# Patient Record
Sex: Female | Born: 1970 | Race: Black or African American | Hispanic: No | Smoking: Former smoker
Health system: Southern US, Community
[De-identification: ages and names within clinical notes are randomized; demographics above are authoritative.]

## PROBLEM LIST (undated history)

## (undated) DIAGNOSIS — D369 Benign neoplasm, unspecified site: Secondary | ICD-10-CM

## (undated) DIAGNOSIS — F429 Obsessive-compulsive disorder, unspecified: Secondary | ICD-10-CM

## (undated) DIAGNOSIS — G43909 Migraine, unspecified, not intractable, without status migrainosus: Secondary | ICD-10-CM

## (undated) DIAGNOSIS — Z5189 Encounter for other specified aftercare: Secondary | ICD-10-CM

## (undated) DIAGNOSIS — I10 Essential (primary) hypertension: Secondary | ICD-10-CM

## (undated) DIAGNOSIS — F32A Depression, unspecified: Secondary | ICD-10-CM

## (undated) DIAGNOSIS — E785 Hyperlipidemia, unspecified: Secondary | ICD-10-CM

## (undated) DIAGNOSIS — K922 Gastrointestinal hemorrhage, unspecified: Secondary | ICD-10-CM

## (undated) DIAGNOSIS — N189 Chronic kidney disease, unspecified: Secondary | ICD-10-CM

## (undated) DIAGNOSIS — K7030 Alcoholic cirrhosis of liver without ascites: Secondary | ICD-10-CM

## (undated) DIAGNOSIS — R600 Localized edema: Secondary | ICD-10-CM

## (undated) DIAGNOSIS — F1010 Alcohol abuse, uncomplicated: Secondary | ICD-10-CM

## (undated) DIAGNOSIS — E039 Hypothyroidism, unspecified: Secondary | ICD-10-CM

## (undated) DIAGNOSIS — R112 Nausea with vomiting, unspecified: Secondary | ICD-10-CM

## (undated) DIAGNOSIS — R011 Cardiac murmur, unspecified: Secondary | ICD-10-CM

## (undated) DIAGNOSIS — L0292 Furuncle, unspecified: Secondary | ICD-10-CM

## (undated) DIAGNOSIS — F329 Major depressive disorder, single episode, unspecified: Secondary | ICD-10-CM

## (undated) DIAGNOSIS — R609 Edema, unspecified: Secondary | ICD-10-CM

## (undated) DIAGNOSIS — F4310 Post-traumatic stress disorder, unspecified: Secondary | ICD-10-CM

## (undated) DIAGNOSIS — D689 Coagulation defect, unspecified: Secondary | ICD-10-CM

## (undated) DIAGNOSIS — K219 Gastro-esophageal reflux disease without esophagitis: Secondary | ICD-10-CM

## (undated) DIAGNOSIS — D649 Anemia, unspecified: Secondary | ICD-10-CM

## (undated) DIAGNOSIS — J449 Chronic obstructive pulmonary disease, unspecified: Secondary | ICD-10-CM

## (undated) DIAGNOSIS — I519 Heart disease, unspecified: Secondary | ICD-10-CM

## (undated) HISTORY — DX: Furuncle, unspecified: L02.92

## (undated) HISTORY — DX: Gastrointestinal hemorrhage, unspecified: K92.2

## (undated) HISTORY — DX: Coagulation defect, unspecified: D68.9

## (undated) HISTORY — DX: Chronic kidney disease, unspecified: N18.9

## (undated) HISTORY — DX: Hyperlipidemia, unspecified: E78.5

## (undated) HISTORY — DX: Encounter for other specified aftercare: Z51.89

## (undated) HISTORY — DX: Heart disease, unspecified: I51.9

## (undated) HISTORY — PX: ABCESS DRAINAGE: SHX399

## (undated) HISTORY — DX: Obsessive-compulsive disorder, unspecified: F42.9

## (undated) HISTORY — DX: Gastro-esophageal reflux disease without esophagitis: K21.9

## (undated) HISTORY — DX: Nausea with vomiting, unspecified: R11.2

## (undated) HISTORY — DX: Post-traumatic stress disorder, unspecified: F43.10

## (undated) HISTORY — PX: VAGINAL HYSTERECTOMY: SHX2639

## (undated) HISTORY — DX: Cardiac murmur, unspecified: R01.1

## (undated) HISTORY — DX: Benign neoplasm, unspecified site: D36.9

---

## 2002-08-17 ENCOUNTER — Emergency Department (HOSPITAL_COMMUNITY): Admission: EM | Admit: 2002-08-17 | Discharge: 2002-08-17 | Payer: Self-pay | Admitting: *Deleted

## 2003-01-30 ENCOUNTER — Emergency Department (HOSPITAL_COMMUNITY): Admission: EM | Admit: 2003-01-30 | Discharge: 2003-01-30 | Payer: Self-pay | Admitting: Emergency Medicine

## 2003-05-31 ENCOUNTER — Emergency Department (HOSPITAL_COMMUNITY): Admission: EM | Admit: 2003-05-31 | Discharge: 2003-05-31 | Payer: Self-pay | Admitting: Emergency Medicine

## 2005-11-09 ENCOUNTER — Emergency Department (HOSPITAL_COMMUNITY): Admission: EM | Admit: 2005-11-09 | Discharge: 2005-11-09 | Payer: Self-pay | Admitting: Emergency Medicine

## 2006-06-06 ENCOUNTER — Emergency Department (HOSPITAL_COMMUNITY): Admission: EM | Admit: 2006-06-06 | Discharge: 2006-06-06 | Payer: Self-pay | Admitting: Emergency Medicine

## 2006-07-28 ENCOUNTER — Emergency Department (HOSPITAL_COMMUNITY): Admission: EM | Admit: 2006-07-28 | Discharge: 2006-07-28 | Payer: Self-pay | Admitting: Emergency Medicine

## 2007-01-17 ENCOUNTER — Emergency Department (HOSPITAL_COMMUNITY): Admission: EM | Admit: 2007-01-17 | Discharge: 2007-01-17 | Payer: Self-pay | Admitting: Emergency Medicine

## 2007-07-31 ENCOUNTER — Emergency Department (HOSPITAL_COMMUNITY): Admission: EM | Admit: 2007-07-31 | Discharge: 2007-07-31 | Payer: Self-pay | Admitting: Emergency Medicine

## 2008-03-04 ENCOUNTER — Emergency Department (HOSPITAL_COMMUNITY): Admission: EM | Admit: 2008-03-04 | Discharge: 2008-03-04 | Payer: Self-pay | Admitting: Emergency Medicine

## 2008-04-13 ENCOUNTER — Other Ambulatory Visit: Admission: RE | Admit: 2008-04-13 | Discharge: 2008-04-13 | Payer: Self-pay | Admitting: Obstetrics & Gynecology

## 2008-09-30 ENCOUNTER — Emergency Department (HOSPITAL_COMMUNITY): Admission: EM | Admit: 2008-09-30 | Discharge: 2008-09-30 | Payer: Self-pay | Admitting: Emergency Medicine

## 2009-04-16 ENCOUNTER — Emergency Department (HOSPITAL_COMMUNITY): Admission: EM | Admit: 2009-04-16 | Discharge: 2009-04-16 | Payer: Self-pay | Admitting: Emergency Medicine

## 2009-09-05 ENCOUNTER — Emergency Department (HOSPITAL_COMMUNITY): Admission: EM | Admit: 2009-09-05 | Discharge: 2009-09-05 | Payer: Self-pay | Admitting: Emergency Medicine

## 2009-12-01 ENCOUNTER — Emergency Department (HOSPITAL_COMMUNITY)
Admission: EM | Admit: 2009-12-01 | Discharge: 2009-12-01 | Payer: Self-pay | Source: Home / Self Care | Admitting: Emergency Medicine

## 2010-05-15 LAB — BASIC METABOLIC PANEL
BUN: 7 mg/dL (ref 6–23)
CO2: 23 mEq/L (ref 19–32)
Calcium: 8.5 mg/dL (ref 8.4–10.5)
Chloride: 104 mEq/L (ref 96–112)
Creatinine, Ser: 1 mg/dL (ref 0.4–1.2)
GFR calc Af Amer: >60 mL/min (ref >60)
GFR calc non Af Amer: >60 mL/min (ref >60)
Glucose, Bld: 92 mg/dL (ref 70–99)
Potassium: 3.6 mEq/L (ref 3.5–5.1)
Sodium: 134 mEq/L — ABNORMAL LOW (ref 135–145)

## 2010-05-15 LAB — CBC
HCT: 38.6 % (ref 36.0–46.0)
Hemoglobin: 12.8 g/dL (ref 12.0–15.0)
MCH: 30.1 pg (ref 26.0–34.0)
MCHC: 33 g/dL (ref 30.0–36.0)
MCV: 91.1 fL (ref 78.0–100.0)
Platelets: 275 10*3/uL (ref 150–400)
RBC: 4.23 MIL/uL (ref 3.87–5.11)
RDW: 16.7 % — ABNORMAL HIGH (ref 11.5–15.5)
WBC: 13.6 10*3/uL — ABNORMAL HIGH (ref 4.0–10.5)

## 2010-05-15 LAB — URINALYSIS, ROUTINE W REFLEX MICROSCOPIC
Bilirubin Urine: NEGATIVE
Glucose, UA: NEGATIVE mg/dL
Ketones, ur: NEGATIVE mg/dL
Leukocytes, UA: NEGATIVE
Nitrite: NEGATIVE
Protein, ur: NEGATIVE mg/dL
Specific Gravity, Urine: 1.025 (ref 1.005–1.030)
Urobilinogen, UA: 0.2 mg/dL (ref 0.0–1.0)
pH: 6 (ref 5.0–8.0)

## 2010-05-15 LAB — DIFFERENTIAL
Basophils Absolute: 0 10*3/uL (ref 0.0–0.1)
Basophils Relative: 0 % (ref 0–1)
Eosinophils Absolute: 0.2 10*3/uL (ref 0.0–0.7)
Eosinophils Relative: 2 % (ref 0–5)
Lymphocytes Relative: 26 % (ref 12–46)
Lymphs Abs: 3.5 10*3/uL (ref 0.7–4.0)
Monocytes Absolute: 0.6 10*3/uL (ref 0.1–1.0)
Monocytes Relative: 4 % (ref 3–12)
Neutro Abs: 9.3 10*3/uL — ABNORMAL HIGH (ref 1.7–7.7)
Neutrophils Relative %: 68 % (ref 43–77)

## 2010-05-15 LAB — URINE CULTURE: Colony Count: 60000

## 2010-05-15 LAB — URINE MICROSCOPIC-ADD ON

## 2010-05-15 LAB — BRAIN NATRIURETIC PEPTIDE: Pro B Natriuretic peptide (BNP): <30 pg/mL (ref 0.0–100.0)

## 2010-05-15 LAB — PREGNANCY, URINE: Preg Test, Ur: NEGATIVE

## 2010-06-04 LAB — GLUCOSE, CAPILLARY: Glucose-Capillary: 81 mg/dL (ref 70–99)

## 2010-06-13 LAB — URINALYSIS, ROUTINE W REFLEX MICROSCOPIC
Bilirubin Urine: NEGATIVE
Glucose, UA: NEGATIVE mg/dL
Hgb urine dipstick: NEGATIVE
Ketones, ur: NEGATIVE mg/dL
Nitrite: NEGATIVE
Protein, ur: NEGATIVE mg/dL
Specific Gravity, Urine: 1.02 (ref 1.005–1.030)
Urobilinogen, UA: 0.2 mg/dL (ref 0.0–1.0)
pH: 7 (ref 5.0–8.0)

## 2010-06-13 LAB — DIFFERENTIAL
Basophils Absolute: 0 10*3/uL (ref 0.0–0.1)
Basophils Relative: 0 % (ref 0–1)
Eosinophils Absolute: 0.1 10*3/uL (ref 0.0–0.7)
Eosinophils Relative: 1 % (ref 0–5)
Lymphocytes Relative: 23 % (ref 12–46)
Lymphs Abs: 2.7 10*3/uL (ref 0.7–4.0)
Monocytes Absolute: 0.5 10*3/uL (ref 0.1–1.0)
Monocytes Relative: 4 % (ref 3–12)
Neutro Abs: 8.5 10*3/uL — ABNORMAL HIGH (ref 1.7–7.7)
Neutrophils Relative %: 72 % (ref 43–77)

## 2010-06-13 LAB — BASIC METABOLIC PANEL
BUN: 9 mg/dL (ref 6–23)
CO2: 24 mEq/L (ref 19–32)
Calcium: 8.8 mg/dL (ref 8.4–10.5)
Chloride: 105 mEq/L (ref 96–112)
Creatinine, Ser: 0.71 mg/dL (ref 0.4–1.2)
GFR calc Af Amer: >60 mL/min (ref >60)
GFR calc non Af Amer: >60 mL/min (ref >60)
Glucose, Bld: 84 mg/dL (ref 70–99)
Potassium: 3.6 mEq/L (ref 3.5–5.1)
Sodium: 137 mEq/L (ref 135–145)

## 2010-06-13 LAB — PREGNANCY, URINE: Preg Test, Ur: NEGATIVE

## 2010-06-13 LAB — CBC
HCT: 41.8 % (ref 36.0–46.0)
Hemoglobin: 13.8 g/dL (ref 12.0–15.0)
MCHC: 33 g/dL (ref 30.0–36.0)
MCV: 87.3 fL (ref 78.0–100.0)
Platelets: 309 10*3/uL (ref 150–400)
RBC: 4.8 MIL/uL (ref 3.87–5.11)
RDW: 14.4 % (ref 11.5–15.5)
WBC: 11.9 10*3/uL — ABNORMAL HIGH (ref 4.0–10.5)

## 2010-10-01 ENCOUNTER — Encounter: Payer: Self-pay | Admitting: *Deleted

## 2010-10-01 ENCOUNTER — Emergency Department (HOSPITAL_COMMUNITY)
Admission: EM | Admit: 2010-10-01 | Discharge: 2010-10-01 | Disposition: A | Discharge status: Home / Self Care | Payer: Self-pay | Attending: Emergency Medicine | Admitting: Emergency Medicine

## 2010-10-01 ENCOUNTER — Emergency Department (HOSPITAL_COMMUNITY): Payer: Self-pay

## 2010-10-01 DIAGNOSIS — F17200 Nicotine dependence, unspecified, uncomplicated: Secondary | ICD-10-CM | POA: Insufficient documentation

## 2010-10-01 DIAGNOSIS — J40 Bronchitis, not specified as acute or chronic: Secondary | ICD-10-CM | POA: Insufficient documentation

## 2010-10-01 LAB — RAPID URINE DRUG SCREEN, HOSP PERFORMED
Amphetamines: NOT DETECTED
Barbiturates: NOT DETECTED
Benzodiazepines: NOT DETECTED
Cocaine: POSITIVE — AB
Opiates: NOT DETECTED
Tetrahydrocannabinol: NOT DETECTED

## 2010-10-01 LAB — PREGNANCY, URINE: Preg Test, Ur: NEGATIVE

## 2010-10-01 MED ORDER — DEXTROMETHORPHAN HBR 15 MG/5ML PO SYRP: 10.0000 mL | ORAL_SOLUTION | Freq: Four times a day (QID) | ORAL | Status: AC | PRN

## 2010-10-01 MED ORDER — AZITHROMYCIN 250 MG PO TABS: 250.0000 mg | ORAL_TABLET | Freq: Every day | ORAL | Status: AC

## 2010-10-01 NOTE — ED Notes (Signed)
Pt states she has been coughing and spitting mucus for a week. She also states she has pain to her sides from coughing. Pt is in ed today d/t sore throat

## 2010-10-01 NOTE — ED Provider Notes (Signed)
History     CSN: [id number] Arrival date & time: 10/01/2010  7:09 AM  Chief Complaint  Patient presents with  . Sore Throat    x1 week   HPI Comments: States productive cough for 2 weeks.  No fevers or chills.  Is a smoker.    Patient is a 40 y.o. female presenting with pharyngitis. The history is provided by the patient.  Sore Throat The current episode started more than 1 week ago. The problem occurs constantly. The problem has not changed since onset.Pertinent negatives include no chest pain, no abdominal pain and no shortness of breath. The symptoms are aggravated by nothing. The symptoms are relieved by nothing. She has tried nothing for the symptoms.    History reviewed. No pertinent past medical history.  History reviewed. No pertinent past surgical history.  Family History  Problem Relation Age of Onset  . Diabetes Mother   . Diabetes Father     History  Substance Use Topics  . Smoking status: Current Everyday Smoker -- 0.5 packs/day  . Smokeless tobacco: Never Used  . Alcohol Use: Yes     pt states she drinks "a lot"    OB History    Grav Para Term Preterm Abortions TAB SAB Ect Mult Living                  Review of Systems  Constitutional: Negative for fever and chills.  HENT: Positive for sore throat.   Respiratory: Positive for cough. Negative for shortness of breath.   Cardiovascular: Negative for chest pain and palpitations.  Gastrointestinal: Negative for abdominal pain.  All other systems reviewed and are negative.    Physical Exam  BP 143/101  Pulse 108  Temp(Src) 98.8 F (37.1 C) (Oral)  Resp 16  Ht 5\' 4"  (1.626 m)  Wt 255 lb (115.667 kg)  BMI 43.77 kg/m2  SpO2 100%  Physical Exam  Constitutional: She is oriented to person, place, and time. She appears well-developed and well-nourished. No distress.  HENT:  Head: Normocephalic and atraumatic.  Right Ear: External ear normal.  Left Ear: External ear normal.  Neck: Normal range of  motion. Neck supple.  Cardiovascular: Normal rate and regular rhythm.  Exam reveals no gallop and no friction rub.   No murmur heard. Pulmonary/Chest: Effort normal and breath sounds normal. No respiratory distress. She has no wheezes. She has no rales.  Abdominal: Soft. Bowel sounds are normal.  Musculoskeletal: Normal range of motion.  Lymphadenopathy:    She has no cervical adenopathy.  Neurological: She is alert and oriented to person, place, and time.  Skin: She is not diaphoretic.    ED Course  Procedures  MDM No resp distress, sats okay.  Will discharge with antibiotics, robit ac.       Geoffery Lyons, MD 10/01/10 [phone]

## 2010-10-06 ENCOUNTER — Encounter (HOSPITAL_COMMUNITY): Payer: Self-pay | Admitting: *Deleted

## 2010-10-06 ENCOUNTER — Emergency Department (HOSPITAL_COMMUNITY)
Admission: EM | Admit: 2010-10-06 | Discharge: 2010-10-06 | Disposition: A | Discharge status: Home / Self Care | Payer: Self-pay | Attending: Emergency Medicine | Admitting: Emergency Medicine

## 2010-10-06 DIAGNOSIS — F411 Generalized anxiety disorder: Secondary | ICD-10-CM | POA: Insufficient documentation

## 2010-10-06 DIAGNOSIS — F17200 Nicotine dependence, unspecified, uncomplicated: Secondary | ICD-10-CM | POA: Insufficient documentation
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
## 2010-11-24 LAB — DIFFERENTIAL
Basophils Absolute: 0.1
Basophils Relative: 1
Monocytes Relative: 5
Neutro Abs: 9.6 — ABNORMAL HIGH
Neutrophils Relative %: 69

## 2010-11-24 LAB — PREGNANCY, URINE: Preg Test, Ur: NEGATIVE

## 2010-11-24 LAB — URINALYSIS, ROUTINE W REFLEX MICROSCOPIC
Hgb urine dipstick: NEGATIVE
Protein, ur: NEGATIVE
Urobilinogen, UA: 0.2

## 2010-11-24 LAB — BASIC METABOLIC PANEL
CO2: 26
Calcium: 9.2
Creatinine, Ser: 0.85
GFR calc Af Amer: >60

## 2010-11-24 LAB — CBC
MCHC: 34.1
RBC: 4.56
RDW: 13.6

## 2010-12-05 ENCOUNTER — Emergency Department (HOSPITAL_COMMUNITY)
Admission: EM | Admit: 2010-12-05 | Discharge: 2010-12-05 | Disposition: A | Discharge status: Home / Self Care | Payer: Self-pay | Attending: Emergency Medicine | Admitting: Emergency Medicine

## 2010-12-05 ENCOUNTER — Encounter (HOSPITAL_COMMUNITY): Payer: Self-pay | Admitting: *Deleted

## 2010-12-05 DIAGNOSIS — Z87891 Personal history of nicotine dependence: Secondary | ICD-10-CM | POA: Insufficient documentation

## 2010-12-05 DIAGNOSIS — S41109A Unspecified open wound of unspecified upper arm, initial encounter: Secondary | ICD-10-CM | POA: Insufficient documentation

## 2010-12-05 MED ORDER — TETANUS-DIPHTH-ACELL PERTUSSIS 5-2.5-18.5 LF-MCG/0.5 IM SUSP
0.5000 mL | Freq: Once | INTRAMUSCULAR | Status: AC
  Administered 2010-12-05: 0.5 mL via INTRAMUSCULAR
  Filled 2010-12-05: qty 0.5
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
## 2011-03-11 ENCOUNTER — Emergency Department (HOSPITAL_COMMUNITY)
Admission: EM | Admit: 2011-03-11 | Discharge: 2011-03-11 | Discharge status: Against Medical Advice | Payer: Self-pay | Attending: Emergency Medicine | Admitting: Emergency Medicine

## 2011-03-11 ENCOUNTER — Encounter (HOSPITAL_COMMUNITY): Payer: Self-pay | Admitting: Emergency Medicine

## 2011-03-11 DIAGNOSIS — M549 Dorsalgia, unspecified: Secondary | ICD-10-CM | POA: Insufficient documentation

## 2011-03-11 HISTORY — DX: Major depressive disorder, single episode, unspecified: F32.9

## 2011-03-11 HISTORY — DX: Essential (primary) hypertension: I10

## 2011-03-11 HISTORY — DX: Depression, unspecified: F32.A
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
## 2011-03-18 ENCOUNTER — Emergency Department (HOSPITAL_COMMUNITY): Payer: Self-pay

## 2011-03-18 ENCOUNTER — Emergency Department (HOSPITAL_COMMUNITY)
Admission: EM | Admit: 2011-03-18 | Discharge: 2011-03-19 | Disposition: A | Discharge status: Home / Self Care | Payer: Self-pay | Attending: Emergency Medicine | Admitting: Emergency Medicine

## 2011-03-18 ENCOUNTER — Other Ambulatory Visit: Payer: Self-pay

## 2011-03-18 ENCOUNTER — Encounter (HOSPITAL_COMMUNITY): Payer: Self-pay

## 2011-03-18 DIAGNOSIS — I498 Other specified cardiac arrhythmias: Secondary | ICD-10-CM | POA: Insufficient documentation

## 2011-03-18 DIAGNOSIS — R0989 Other specified symptoms and signs involving the circulatory and respiratory systems: Secondary | ICD-10-CM | POA: Insufficient documentation

## 2011-03-18 DIAGNOSIS — G43909 Migraine, unspecified, not intractable, without status migrainosus: Secondary | ICD-10-CM | POA: Insufficient documentation

## 2011-03-18 DIAGNOSIS — R05 Cough: Secondary | ICD-10-CM | POA: Insufficient documentation

## 2011-03-18 DIAGNOSIS — R609 Edema, unspecified: Secondary | ICD-10-CM | POA: Insufficient documentation

## 2011-03-18 DIAGNOSIS — F329 Major depressive disorder, single episode, unspecified: Secondary | ICD-10-CM | POA: Insufficient documentation

## 2011-03-18 DIAGNOSIS — Z87891 Personal history of nicotine dependence: Secondary | ICD-10-CM | POA: Insufficient documentation

## 2011-03-18 DIAGNOSIS — R0602 Shortness of breath: Secondary | ICD-10-CM | POA: Insufficient documentation

## 2011-03-18 DIAGNOSIS — R0600 Dyspnea, unspecified: Secondary | ICD-10-CM

## 2011-03-18 DIAGNOSIS — F3289 Other specified depressive episodes: Secondary | ICD-10-CM | POA: Insufficient documentation

## 2011-03-18 DIAGNOSIS — I10 Essential (primary) hypertension: Secondary | ICD-10-CM | POA: Insufficient documentation

## 2011-03-18 DIAGNOSIS — X58XXXA Exposure to other specified factors, initial encounter: Secondary | ICD-10-CM | POA: Insufficient documentation

## 2011-03-18 DIAGNOSIS — S8010XA Contusion of unspecified lower leg, initial encounter: Secondary | ICD-10-CM | POA: Insufficient documentation

## 2011-03-18 DIAGNOSIS — R110 Nausea: Secondary | ICD-10-CM | POA: Insufficient documentation

## 2011-03-18 DIAGNOSIS — R600 Localized edema: Secondary | ICD-10-CM

## 2011-03-18 DIAGNOSIS — R059 Cough, unspecified: Secondary | ICD-10-CM | POA: Insufficient documentation

## 2011-03-18 DIAGNOSIS — R0609 Other forms of dyspnea: Secondary | ICD-10-CM | POA: Insufficient documentation

## 2011-03-18 HISTORY — DX: Migraine, unspecified, not intractable, without status migrainosus: G43.909

## 2011-03-18 MED ORDER — ONDANSETRON 4 MG PO TBDP
4.0000 mg | ORAL_TABLET | Freq: Once | ORAL | Status: AC
  Administered 2011-03-18: 4 mg via ORAL
  Filled 2011-03-18: qty 1

## 2011-03-18 MED ORDER — HYDROMORPHONE HCL PF 1 MG/ML IJ SOLN
1.0000 mg | Freq: Once | INTRAMUSCULAR | Status: AC
  Administered 2011-03-18: 1 mg via INTRAMUSCULAR
  Filled 2011-03-18: qty 1

## 2011-03-18 MED ORDER — METHYLPREDNISOLONE SODIUM SUCC 125 MG IJ SOLR
125.0000 mg | Freq: Once | INTRAMUSCULAR | Status: AC
  Administered 2011-03-18: 125 mg via INTRAMUSCULAR
  Filled 2011-03-18: qty 2

## 2011-03-18 MED ORDER — DIPHENHYDRAMINE HCL 25 MG PO CAPS
50.0000 mg | ORAL_CAPSULE | Freq: Once | ORAL | Status: AC
  Administered 2011-03-18: 50 mg via ORAL
  Filled 2011-03-18: qty 2

## 2011-03-18 NOTE — ED Notes (Signed)
Pt presents with migraine and leg swelling x 1 month. Pt also c/o diarrhea and dizziness.

## 2011-03-18 NOTE — ED Notes (Signed)
C/o bruising of lower legs, back pain radiating down her legs.  Ambulatory to BR.  LMP - now.
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
## 2011-03-19 LAB — COMPREHENSIVE METABOLIC PANEL
ALT: 49 U/L — ABNORMAL HIGH (ref 0–35)
Alkaline Phosphatase: 74 U/L (ref 39–117)
BUN: 10 mg/dL (ref 6–23)
Chloride: 100 mEq/L (ref 96–112)
GFR calc Af Amer: >90 mL/min (ref >90)
Glucose, Bld: 100 mg/dL — ABNORMAL HIGH (ref 70–99)
Potassium: 3.7 mEq/L (ref 3.5–5.1)
Total Bilirubin: 0.2 mg/dL — ABNORMAL LOW (ref 0.3–1.2)

## 2011-03-19 LAB — CBC
Hemoglobin: 12.5 g/dL (ref 12.0–15.0)
RBC: 4.01 MIL/uL (ref 3.87–5.11)
WBC: 13.9 10*3/uL — ABNORMAL HIGH (ref 4.0–10.5)

## 2011-03-19 LAB — PRO B NATRIURETIC PEPTIDE: Pro B Natriuretic peptide (BNP): 52.3 pg/mL (ref 0–125)

## 2011-03-19 LAB — DIFFERENTIAL
Lymphocytes Relative: 28 % (ref 12–46)
Lymphs Abs: 3.9 10*3/uL (ref 0.7–4.0)
Monocytes Relative: 8 % (ref 3–12)
Neutro Abs: 8.8 10*3/uL — ABNORMAL HIGH (ref 1.7–7.7)
Neutrophils Relative %: 64 % (ref 43–77)

## 2011-03-20 NOTE — ED Provider Notes (Signed)
Medical screening examination/treatment/procedure(s) were performed by non-physician practitioner and as supervising physician I was immediately available for consultation/collaboration.   Bandy Honaker L Lizandro Spellman, MD 03/20/11 1134 

## 2011-12-27 ENCOUNTER — Emergency Department (HOSPITAL_COMMUNITY): Payer: Self-pay

## 2011-12-27 ENCOUNTER — Emergency Department (HOSPITAL_COMMUNITY)
Admission: EM | Admit: 2011-12-27 | Discharge: 2011-12-27 | Disposition: A | Discharge status: Home / Self Care | Payer: Self-pay | Attending: Emergency Medicine | Admitting: Emergency Medicine

## 2011-12-27 ENCOUNTER — Encounter (HOSPITAL_COMMUNITY): Payer: Self-pay | Admitting: *Deleted

## 2011-12-27 DIAGNOSIS — R0600 Dyspnea, unspecified: Secondary | ICD-10-CM

## 2011-12-27 DIAGNOSIS — Z8669 Personal history of other diseases of the nervous system and sense organs: Secondary | ICD-10-CM | POA: Insufficient documentation

## 2011-12-27 DIAGNOSIS — F10229 Alcohol dependence with intoxication, unspecified: Secondary | ICD-10-CM | POA: Insufficient documentation

## 2011-12-27 DIAGNOSIS — R079 Chest pain, unspecified: Secondary | ICD-10-CM | POA: Insufficient documentation

## 2011-12-27 DIAGNOSIS — R0989 Other specified symptoms and signs involving the circulatory and respiratory systems: Secondary | ICD-10-CM | POA: Insufficient documentation

## 2011-12-27 DIAGNOSIS — K2970 Gastritis, unspecified, without bleeding: Secondary | ICD-10-CM | POA: Insufficient documentation

## 2011-12-27 DIAGNOSIS — Z79899 Other long term (current) drug therapy: Secondary | ICD-10-CM | POA: Insufficient documentation

## 2011-12-27 DIAGNOSIS — Z8659 Personal history of other mental and behavioral disorders: Secondary | ICD-10-CM | POA: Insufficient documentation

## 2011-12-27 DIAGNOSIS — F1020 Alcohol dependence, uncomplicated: Secondary | ICD-10-CM

## 2011-12-27 DIAGNOSIS — R0609 Other forms of dyspnea: Secondary | ICD-10-CM | POA: Insufficient documentation

## 2011-12-27 DIAGNOSIS — I10 Essential (primary) hypertension: Secondary | ICD-10-CM | POA: Insufficient documentation

## 2011-12-27 DIAGNOSIS — K2990 Gastroduodenitis, unspecified, without bleeding: Secondary | ICD-10-CM

## 2011-12-27 DIAGNOSIS — Z87891 Personal history of nicotine dependence: Secondary | ICD-10-CM | POA: Insufficient documentation

## 2011-12-27 DIAGNOSIS — R748 Abnormal levels of other serum enzymes: Secondary | ICD-10-CM

## 2011-12-27 LAB — CBC WITH DIFFERENTIAL/PLATELET
Eosinophils Relative: 0 % (ref 0–5)
HCT: 36.4 % (ref 36.0–46.0)
Lymphocytes Relative: 24 % (ref 12–46)
Lymphs Abs: 2.2 10*3/uL (ref 0.7–4.0)
MCH: 31.5 pg (ref 26.0–34.0)
MCHC: 33.2 g/dL (ref 30.0–36.0)
Monocytes Relative: 5 % (ref 3–12)
Platelets: 290 10*3/uL (ref 150–400)

## 2011-12-27 LAB — HEPATIC FUNCTION PANEL
AST: 140 U/L — ABNORMAL HIGH (ref 0–37)
Albumin: 3.3 g/dL — ABNORMAL LOW (ref 3.5–5.2)
Total Bilirubin: 0.5 mg/dL (ref 0.3–1.2)
Total Protein: 8.3 g/dL (ref 6.0–8.3)

## 2011-12-27 LAB — LIPASE, BLOOD: Lipase: 22 U/L (ref 11–59)

## 2011-12-27 LAB — BASIC METABOLIC PANEL
CO2: 26 mEq/L (ref 19–32)
Glucose, Bld: 92 mg/dL (ref 70–99)
Potassium: 3.8 mEq/L (ref 3.5–5.1)
Sodium: 138 mEq/L (ref 135–145)

## 2011-12-27 LAB — TROPONIN I
Troponin I: <0.3 ng/mL (ref <0.30)
Troponin I: <0.3 ng/mL (ref <0.30)

## 2011-12-27 LAB — URINALYSIS, ROUTINE W REFLEX MICROSCOPIC
Glucose, UA: NEGATIVE mg/dL
Leukocytes, UA: NEGATIVE
pH: 7.5 (ref 5.0–8.0)

## 2011-12-27 LAB — MAGNESIUM: Magnesium: 1.8 mg/dL (ref 1.5–2.5)

## 2011-12-27 LAB — PRO B NATRIURETIC PEPTIDE: Pro B Natriuretic peptide (BNP): 39.4 pg/mL (ref 0–125)

## 2011-12-27 MED ORDER — GI COCKTAIL ~~LOC~~
30.0000 mL | Freq: Once | ORAL | Status: AC
  Administered 2011-12-27: 30 mL via ORAL
  Filled 2011-12-27: qty 30

## 2011-12-27 MED ORDER — RANITIDINE HCL 150 MG PO TABS: 150.0000 mg | ORAL_TABLET | Freq: Two times a day (BID) | ORAL | Status: DC

## 2011-12-27 MED ORDER — THIAMINE HCL 100 MG/ML IJ SOLN
100.0000 mg | Freq: Every day | INTRAMUSCULAR | Status: DC
  Administered 2011-12-27: 100 mg via INTRAVENOUS
  Filled 2011-12-27: qty 2

## 2011-12-27 MED ORDER — SODIUM CHLORIDE 0.9 % IV SOLN
Freq: Once | INTRAVENOUS | Status: AC
  Administered 2011-12-27: 15:00:00 via INTRAVENOUS

## 2011-12-27 NOTE — ED Notes (Signed)
Attempted IV access x 2. Unsuccessful. 

## 2011-12-27 NOTE — ED Provider Notes (Addendum)
History  This chart was scribed for Derwood Kaplan, MD by Shari Heritage. The patient was seen in room APA03/APA03. Patient's care was started at 1250.     CSN: [id number]  Arrival date & time 12/27/11  1103   First MD Initiated Contact with Patient 12/27/11 1250      Chief Complaint  Patient presents with  . Abdominal Pain    Patient is a 41 y.o. female presenting with abdominal pain. The history is provided by the patient. No language interpreter was used.  Abdominal Pain The primary symptoms of the illness include abdominal pain, shortness of breath, nausea and vomiting. The current episode started more than 2 days ago. The problem has not changed since onset. The abdominal pain began more than 2 days ago. The abdominal pain has been unchanged since its onset. The abdominal pain is located in the epigastric region. The abdominal pain does not radiate.  The shortness of breath began more than 2 days ago. The patient's medical history does not include CHF or COPD.  The vomiting began more than 2 days ago. The emesis contains stomach contents.  Significant associated medical issues do not include diabetes, liver disease or cardiac disease.    HPI Comments: Whitney Bush is a 41 y.o. female who presents to the Emergency Department complaining of intermittent, moderate to severe, upper epigatric abdominal pain onset 2 weeks ago. The episodes last about 30 minutes each. There is associated nausea, vomiting and occasionally darkened stool with no bright red blood seen. Patient states that pain worsens when she drinks alcohol or eats. Patient is also complaining of cough that is often associated with vomiting, sharp midsternal chest pain, leg swelling, and moderate SOB. She says that she has been experiencing SOB for a few months. She says that she drinks beer and liquor daily and that her last drink was yesterday. Patient denies history of ulcer, liver disease, alcohol withdrawal, heart  disease or lung disease. She has a medical history of HTN, depression and migraines.   Past Medical History  Diagnosis Date  . Hypertension   . Depression   . Migraines     History reviewed. No pertinent past surgical history.  Family History  Problem Relation Age of Onset  . Diabetes Father   . Cancer Other     History  Substance Use Topics  . Smoking status: Former Smoker -- 1.0 packs/day for 5 years    Types: Cigarettes    Quit date: 12/29/2010  . Smokeless tobacco: Current User    Types: Chew  . Alcohol Use: No    OB History    Grav Para Term Preterm Abortions TAB SAB Ect Mult Living   2 1 1  1     1       Review of Systems  Respiratory: Positive for cough and shortness of breath.   Cardiovascular: Positive for chest pain and leg swelling.  Gastrointestinal: Positive for nausea, vomiting and abdominal pain.  All other systems reviewed and are negative.    Allergies  Review of patient's allergies indicates no known allergies.  Home Medications   Current Outpatient Rx  Name Route Sig Dispense Refill  . LISINOPRIL 10 MG PO TABS Oral Take 10 mg by mouth daily.      BP 158/106  Pulse 98  Temp 98.7 F (37.1 C) (Oral)  Resp 20  Ht 5\' 3"  (1.6 m)  Wt 250 lb 1 oz (113.428 kg)  BMI 44.30 kg/m2  SpO2 100%  LMP  12/20/2011  Physical Exam  Nursing note and vitals reviewed. Constitutional: She is oriented to person, place, and time. She appears well-developed and well-nourished.  HENT:  Head: Normocephalic and atraumatic.  Neck: No JVD present.  Cardiovascular: Normal rate and regular rhythm.   No murmur heard. Pulmonary/Chest: Effort normal and breath sounds normal. No respiratory distress. She has no wheezes. She has no rales.  Abdominal: Soft. Bowel sounds are normal. There is tenderness (epigastric tenderness only) in the epigastric area. There is no rebound and no guarding.  Musculoskeletal: Normal range of motion. She exhibits no edema.       No  unilateral swelling. No calf tenderness. No pitting edema seen in bilateral lower extremities.  Neurological: She is alert and oriented to person, place, and time.  Skin: Skin is warm and dry.  Psychiatric: She has a normal mood and affect. Her behavior is normal.    ED Course  Procedures (including critical care time) DIAGNOSTIC STUDIES: Oxygen Saturation is 100% on room air, normal by my interpretation.    COORDINATION OF CARE: 1:23pm- Patient informed of current plan for treatment and evaluation and agrees with plan at this time.  Results for orders placed during the hospital encounter of 12/27/11  CBC WITH DIFFERENTIAL      Component Value Range   WBC 9.5  4.0 - 10.5 K/uL   RBC 3.84 (*) 3.87 - 5.11 MIL/uL   Hemoglobin 12.1  12.0 - 15.0 g/dL   HCT 16.1  09.6 - 04.5 %   MCV 94.8  78.0 - 100.0 fL   MCH 31.5  26.0 - 34.0 pg   MCHC 33.2  30.0 - 36.0 g/dL   RDW 40.9 (*) 81.1 - 91.4 %   Platelets 290  150 - 400 K/uL   Neutrophils Relative 71  43 - 77 %   Neutro Abs 6.7  1.7 - 7.7 K/uL   Lymphocytes Relative 24  12 - 46 %   Lymphs Abs 2.2  0.7 - 4.0 K/uL   Monocytes Relative 5  3 - 12 %   Monocytes Absolute 0.5  0.1 - 1.0 K/uL   Eosinophils Relative 0  0 - 5 %   Eosinophils Absolute 0.0  0.0 - 0.7 K/uL   Basophils Relative 0  0 - 1 %   Basophils Absolute 0.0  0.0 - 0.1 K/uL  BASIC METABOLIC PANEL      Component Value Range   Sodium 138  135 - 145 mEq/L   Potassium 3.8  3.5 - 5.1 mEq/L   Chloride 100  96 - 112 mEq/L   CO2 26  19 - 32 mEq/L   Glucose, Bld 92  70 - 99 mg/dL   BUN 2 (*) 6 - 23 mg/dL   Creatinine, Ser 7.82  0.50 - 1.10 mg/dL   Calcium 9.2  8.4 - 95.6 mg/dL   GFR calc non Af Amer >90  >90 mL/min   GFR calc Af Amer >90  >90 mL/min    No results found.   No diagnosis found.    MDM  Medical screening examination/treatment/procedure(s) were performed by me as the supervising physician. Scribe service was utilized for documentation only.   Date:  12/27/2011  Rate: 86  Rhythm: normal sinus rhythm  QRS Axis: normal  Intervals: normal  ST/T Wave abnormalities: normal  Conduction Disutrbances: none  Narrative Interpretation: unremarkable   Differential diagnosis includes: ACS syndrome CHF exacerbation Valvular disorder Myocarditis Pericarditis Pericardial effusion Pneumonia Pleural effusion Pulmonary edema PE Anemia  Musculoskeletal pain PUD/Gastritis  Pt comes in with cc of abd pain and sob. Pt is a heavy drinker. Abd pain - epigastric, worse after alcohol and food, worse at night. Appears to be gastritis clinically.  SOB - exertional, with no new orthopnea, PND. She occasionally has chest pain. Exam is negative for fluid overload, lungs are clear, heart exam has no murmurs. Pt has no risk factors for PE, DVT, she has a Wells score of 0, and is PERC negative.  Will get basic GI labs and cardiac labs. BNP ordered to r.o CHF.   Derwood Kaplan, MD 12/27/11 1637  4:41 PM All the labs are negative besides LFTs. Will d./c if 2nd troponin negative. Pt counseled on stopping alcohol use, and we asked her to see her primary care doctor or come to the ER if she wants detox help. She understands.  Derwood Kaplan, MD 12/27/11 1642

## 2011-12-27 NOTE — ED Notes (Signed)
Patient with no complaints at this time. Respirations even and unlabored. Skin warm/dry. Discharge instructions reviewed with patient at this time. Patient given opportunity to voice concerns/ask questions. IV removed per policy and band-aid applied to site. Patient discharged at this time and left Emergency Department with steady gait.  

## 2011-12-27 NOTE — ED Notes (Signed)
Abdominal pain for 2 weeks, sleepy for over a month, rash over body for a week, dizzy for a month, numbness in toes and hands for 2 weeks

## 2011-12-27 NOTE — ED Notes (Signed)
Pt urine spilled in bag and lab unable to use. Pt states period has been on and off so pt catherized and sent to lab

## 2011-12-29 ENCOUNTER — Encounter (HOSPITAL_COMMUNITY): Payer: Self-pay | Admitting: *Deleted

## 2011-12-29 ENCOUNTER — Emergency Department (HOSPITAL_COMMUNITY)
Admission: EM | Admit: 2011-12-29 | Discharge: 2011-12-29 | Disposition: A | Discharge status: Home / Self Care | Payer: Self-pay | Attending: Emergency Medicine | Admitting: Emergency Medicine

## 2011-12-29 DIAGNOSIS — M7989 Other specified soft tissue disorders: Secondary | ICD-10-CM | POA: Insufficient documentation

## 2011-12-29 DIAGNOSIS — Z8659 Personal history of other mental and behavioral disorders: Secondary | ICD-10-CM | POA: Insufficient documentation

## 2011-12-29 DIAGNOSIS — I10 Essential (primary) hypertension: Secondary | ICD-10-CM | POA: Insufficient documentation

## 2011-12-29 DIAGNOSIS — R21 Rash and other nonspecific skin eruption: Secondary | ICD-10-CM | POA: Insufficient documentation

## 2011-12-29 DIAGNOSIS — Z820 Family history of epilepsy and other diseases of the nervous system: Secondary | ICD-10-CM | POA: Insufficient documentation

## 2011-12-29 DIAGNOSIS — L290 Pruritus ani: Secondary | ICD-10-CM | POA: Insufficient documentation

## 2011-12-29 DIAGNOSIS — L299 Pruritus, unspecified: Secondary | ICD-10-CM

## 2011-12-29 DIAGNOSIS — Z87891 Personal history of nicotine dependence: Secondary | ICD-10-CM | POA: Insufficient documentation

## 2011-12-29 DIAGNOSIS — Z79899 Other long term (current) drug therapy: Secondary | ICD-10-CM | POA: Insufficient documentation

## 2011-12-29 MED ORDER — PREDNISONE 50 MG PO TABS
60.0000 mg | ORAL_TABLET | Freq: Once | ORAL | Status: AC
  Administered 2011-12-29: 60 mg via ORAL
  Filled 2011-12-29: qty 1

## 2011-12-29 MED ORDER — FAMOTIDINE 20 MG PO TABS: 40.0000 mg | ORAL_TABLET | Freq: Two times a day (BID) | ORAL | Status: DC

## 2011-12-29 MED ORDER — DIPHENHYDRAMINE HCL 25 MG PO CAPS
50.0000 mg | ORAL_CAPSULE | Freq: Once | ORAL | Status: AC
  Administered 2011-12-29: 50 mg via ORAL
  Filled 2011-12-29: qty 2

## 2011-12-29 MED ORDER — PREDNISONE 20 MG PO TABS: ORAL_TABLET | ORAL | Status: DC

## 2011-12-29 MED ORDER — FAMOTIDINE 20 MG PO TABS
20.0000 mg | ORAL_TABLET | Freq: Once | ORAL | Status: AC
  Administered 2011-12-29: 20 mg via ORAL
  Filled 2011-12-29: qty 1

## 2011-12-29 NOTE — ED Provider Notes (Cosign Needed)
History   This chart was scribed for Ward Givens, MD by Gerlean Ren. This patient was seen in room APA03/APA03 and the patient's care was started at 07:14.   CSN: [id number]  Arrival date & time 12/29/11  4098   First MD Initiated Contact with Patient 12/29/11 [phone]      Chief Complaint  Patient presents with  . Rash  . Pruritis    (Consider location/radiation/quality/duration/timing/severity/associated sxs/prior treatment) The history is provided by the patient. No language interpreter was used.  Whitney Bush is a 41 y.o. female who presents to the Emergency Department complaining of 2 weeks of constant itching rash over lower and upper extremities with associated bilateral hand and feet swelling and dry cough.  No change in detergents, body washes, or medications.  Pt was seen here 2 days ago for abdominal pain and received Zantac, but reports itching and rash were present prior to that and have not worsened since.  Pt denies h/o similar rash.  Husband/boyfriend present denies any itching or rash.  Pt denies fever, neck pain, CP, dyspnea, abdominal pain, nausea, emesis, and back pain as associated symptoms.  Pt has no swelling in her throat, swelling of tongue, swelling in face or rash on face.   Pt has h/o HTN and depression.  Pt is a current smokeless tobacco user and is a current everyday alcohol user (quit 2 days ago).  PCP at [facility].  Past Medical History  Diagnosis Date  . Hypertension   . Depression   . Migraines     History reviewed. No pertinent past surgical history.  Family History  Problem Relation Age of Onset  . Diabetes Father   . Cancer Other     History  Substance Use Topics  . Smoking status: Former Smoker -- 1.0 packs/day for 5 years    Types: Cigarettes    Quit date: 12/29/2010  . Smokeless tobacco: Current User    Types: Chew  . Alcohol Use: Yes  1 pint vodka + 3-4 shots + 3-4 wine coolers a day Sober 2 days  Lives with  spouse unemployed  OB History    Grav Para Term Preterm Abortions TAB SAB Ect Mult Living   2 1 1  1     1       Review of Systems  Skin: Positive for rash. Negative for wound.  All other systems reviewed and are negative.    Allergies  Review of patient's allergies indicates no known allergies.  Home Medications   Current Outpatient Rx  Name Route Sig Dispense Refill  . LISINOPRIL 10 MG PO TABS Oral Take 10 mg by mouth daily.    Marland Kitchen RANITIDINE HCL 150 MG PO TABS Oral Take 1 tablet (150 mg total) by mouth 2 (two) times daily. 60 tablet 0    BP 135/94  Pulse 120  Temp 99.6 F (37.6 C) (Oral)  Resp 18  Ht 5\' 3"  (1.6 m)  Wt 250 lb (113.399 kg)  BMI 44.29 kg/m2  SpO2 100%  LMP 12/20/2011  Vital signs normal    Physical Exam  Nursing note and vitals reviewed. Constitutional: She is oriented to person, place, and time. She appears well-developed and well-nourished.  Non-toxic appearance. She does not appear ill. No distress.  HENT:  Head: Normocephalic and atraumatic.  Right Ear: External ear normal.  Left Ear: External ear normal.  Nose: Nose normal. No mucosal edema or rhinorrhea.  Mouth/Throat: Oropharynx is clear and moist and mucous membranes  are normal. No dental abscesses or uvula swelling.  Eyes: Conjunctivae normal and EOM are normal. Pupils are equal, round, and reactive to light.  Neck: Normal range of motion and full passive range of motion without pain. Neck supple.  Pulmonary/Chest: Effort normal and breath sounds normal. No respiratory distress. She has no rhonchi. She exhibits no crepitus.  Abdominal: Normal appearance.  Musculoskeletal: Normal range of motion. She exhibits no edema and no tenderness.       Moves all extremities well.   Neurological: She is alert and oriented to person, place, and time. She has normal strength. No cranial nerve deficit.  Skin: Skin is warm, dry and intact. Rash noted. No erythema. No pallor.       Small scattered  papules over bilateral lower and upper extremities.  Some are hyperpigmented, some are hypopigmented, and some are skin-colored.  No hives or open wounds.  No involvement of palms or soles, between fingers, face, and back.  Psychiatric: She has a normal mood and affect. Her speech is normal and behavior is normal. Her mood appears not anxious.    ED Course  Procedures (including critical care time)   Medications  diphenhydrAMINE (BENADRYL) capsule 50 mg (50 mg Oral Given 12/29/11 0729)  famotidine (PEPCID) tablet 20 mg (20 mg Oral Given 12/29/11 0729)  predniSONE (DELTASONE) tablet 60 mg (60 mg Oral Given 12/29/11 0729)    DIAGNOSTIC STUDIES: Oxygen Saturation is 100% on room air, normal by my interpretation.    COORDINATION OF CARE: 07:20- Patient informed of clinical course, understands medical decision-making process, and agrees with plan.  Ordered PO benadryl, PO Pepcid, and PO deltasone.  Review of recents labs shows mild elevation of alk phosphatase so that is not the source of her rash/itching  07:55- Re-check, itching has improved.  Pt has seen dermatologist Dr. Margo Aye previously and is familiar with location.  Discussed medications   1. Papular rash, localized   2. Pruritus    New Prescriptions   FAMOTIDINE (PEPCID) 20 MG TABLET    Take 2 tablets (40 mg total) by mouth 2 (two) times daily.   PREDNISONE (DELTASONE) 20 MG TABLET    Take 3 po QD x 2d starting tomorrow, then 2 po QD x 3d then 1 po QD x 3d  OTC benadryl/zyrtec   Plan discharge  Devoria Albe, MD, FACEP    MDM   I personally performed the services described in this documentation, which was scribed in my presence. The recorded information has been reviewed and considered.  Devoria Albe, MD, Armando Gang       Ward Givens, MD 12/29/11 [phone]

## 2011-12-29 NOTE — ED Notes (Signed)
Pt reporting rash and itching on lower legs, hands and feet.  Reports "it feels like pins sticking me or something".  Pt reports symptoms began about 1 week ago.  Was seen in department recently for abdominal pain, was experiencing these symptoms prior to last visit and prescriptions obtained at that time. No distress noted at present time.

## 2012-02-20 ENCOUNTER — Emergency Department (HOSPITAL_COMMUNITY)
Admission: EM | Admit: 2012-02-20 | Discharge: 2012-02-20 | Disposition: A | Discharge status: Home / Self Care | Payer: Self-pay | Attending: Emergency Medicine | Admitting: Emergency Medicine

## 2012-02-20 ENCOUNTER — Encounter (HOSPITAL_COMMUNITY): Payer: Self-pay | Admitting: Emergency Medicine

## 2012-02-20 DIAGNOSIS — I10 Essential (primary) hypertension: Secondary | ICD-10-CM | POA: Insufficient documentation

## 2012-02-20 DIAGNOSIS — Z79899 Other long term (current) drug therapy: Secondary | ICD-10-CM | POA: Insufficient documentation

## 2012-02-20 DIAGNOSIS — Z8659 Personal history of other mental and behavioral disorders: Secondary | ICD-10-CM | POA: Insufficient documentation

## 2012-02-20 DIAGNOSIS — R609 Edema, unspecified: Secondary | ICD-10-CM

## 2012-02-20 DIAGNOSIS — G43909 Migraine, unspecified, not intractable, without status migrainosus: Secondary | ICD-10-CM | POA: Insufficient documentation

## 2012-02-20 DIAGNOSIS — Z87891 Personal history of nicotine dependence: Secondary | ICD-10-CM | POA: Insufficient documentation

## 2012-02-20 MED ORDER — ACETAMINOPHEN-CODEINE #3 300-30 MG PO TABS: 1.0000 | ORAL_TABLET | Freq: Four times a day (QID) | ORAL | Status: DC | PRN

## 2012-02-20 NOTE — ED Provider Notes (Signed)
History     CSN: [id number]  Arrival date & time 02/20/12  0845   First MD Initiated Contact with Patient 02/20/12 [phone]      Chief Complaint  Patient presents with  . Foot Pain    (Consider location/radiation/quality/duration/timing/severity/associated sxs/prior treatment) Patient is a 41 y.o. female presenting with lower extremity pain. The history is provided by the patient.  Foot Pain This is a recurrent problem. The problem occurs every several days. The problem has been gradually worsening (Worse over the last 3 days.). Associated symptoms include myalgias. Pertinent negatives include no abdominal pain, arthralgias, chest pain, coughing, fever, neck pain or numbness. Nothing aggravates the symptoms. She has tried nothing for the symptoms. The treatment provided no relief.    Past Medical History  Diagnosis Date  . Hypertension   . Depression   . Migraines     History reviewed. No pertinent past surgical history.  Family History  Problem Relation Age of Onset  . Diabetes Father   . Cancer Other     History  Substance Use Topics  . Smoking status: Former Smoker -- 1.0 packs/day for 5 years    Types: Cigarettes    Quit date: 12/29/2010  . Smokeless tobacco: Current User    Types: Chew  . Alcohol Use: No    OB History    Grav Para Term Preterm Abortions TAB SAB Ect Mult Living   2 1 1  1     1       Review of Systems  Constitutional: Negative for fever and activity change.       All ROS Neg except as noted in HPI  HENT: Negative for nosebleeds and neck pain.   Eyes: Negative for photophobia and discharge.  Respiratory: Negative for cough, shortness of breath and wheezing.   Cardiovascular: Negative for chest pain and palpitations.  Gastrointestinal: Negative for abdominal pain and blood in stool.  Genitourinary: Negative for dysuria, frequency and hematuria.  Musculoskeletal: Positive for myalgias. Negative for back pain and arthralgias.       Lower  extremity swelling  Skin: Negative.   Neurological: Negative for dizziness, seizures, speech difficulty and numbness.  Psychiatric/Behavioral: Negative for hallucinations and confusion.    Allergies  Review of patient's allergies indicates no known allergies.  Home Medications   Current Outpatient Rx  Name  Route  Sig  Dispense  Refill  . FAMOTIDINE 20 MG PO TABS   Oral   Take 2 tablets (40 mg total) by mouth 2 (two) times daily.   18 tablet   0   . LISINOPRIL 10 MG PO TABS   Oral   Take 10 mg by mouth daily.         Marland Kitchen PREDNISONE 20 MG PO TABS      Take 3 po QD x 2d starting tomorrow, then 2 po QD x 3d then 1 po QD x 3d   15 tablet   0   . RANITIDINE HCL 150 MG PO TABS   Oral   Take 1 tablet (150 mg total) by mouth 2 (two) times daily.   60 tablet   0     BP 130/94  Pulse 103  Temp 98.3 F (36.8 C) (Oral)  Resp 17  SpO2 100%  LMP 01/30/2012  Physical Exam  Nursing note and vitals reviewed. Constitutional: She is oriented to person, place, and time. She appears well-developed and well-nourished.  Non-toxic appearance.  HENT:  Head: Normocephalic.  Right Ear: Tympanic membrane and  external ear normal.  Left Ear: Tympanic membrane and external ear normal.  Eyes: EOM and lids are normal. Pupils are equal, round, and reactive to light.  Neck: Normal range of motion. Neck supple. No JVD present. Carotid bruit is not present.  Cardiovascular: Regular rhythm, normal heart sounds, intact distal pulses and normal pulses.  Tachycardia present.   Pulmonary/Chest: Breath sounds normal. No respiratory distress. She has no wheezes. She has no rales.  Abdominal: Soft. Bowel sounds are normal. There is no tenderness. There is no guarding.  Musculoskeletal: Normal range of motion. She exhibits no edema.       Mild tenderness under the metatarsal heads. No pitting edema. No increase redness. Neg Homan's sign. DP and PT pulse 2+. No rash present.  Lymphadenopathy:       Head  (right side): No submandibular adenopathy present.       Head (left side): No submandibular adenopathy present.    She has no cervical adenopathy.  Neurological: She is alert and oriented to person, place, and time. She has normal strength. No cranial nerve deficit or sensory deficit.  Skin: Skin is warm and dry.  Psychiatric: She has a normal mood and affect. Her speech is normal.    ED Course  Procedures (including critical care time)  Labs Reviewed - No data to display No results found.   No diagnosis found.    MDM  I have reviewed nursing notes, vital signs, and all appropriate lab and imaging results for this patient. Patient presents to the emergency department with at least 3 days of swelling of both feet and ankles. The patient notes that the left seemed to be a little worse than the right at times. The patient denies any unusual shortness of breath, cough, or chest pain. The swelling seems to come and go, and is worse usually in the evening or at night.  Review of the previous emergency department visits revealed a slight elevation in the liver enzyme. Patient has a history of alcohol use in the past, states she is not using alcohol at this time. On today's examination I am not finding evidence of pitting edema or findings consistent with deep vein thrombosis, or congestive heart failure, or ascites related to the abdomen. Discuss with patient the symptoms of dependent edema. The need to elevate legs as much as possible, and the need for evaluation by her primary physician concerning and other possibilities or causes of peripheral edema. Patient also advised to have her pulse rate and blood pressure rechecked as both were slightly elevated on today's visit.       Kathie Dike, [place] 02/26/12 1255

## 2012-02-20 NOTE — ED Notes (Signed)
Pt c/o bilateral lower leg/ankle swelling/pain x 3 days. Swelling noted nonpitting with Left leg worse than right. bialteral equal temp to legs. No warmth noted. Pain to touch. Slight dark discoloration to l lower leg. Pedal pulses present. Rating pain 6. Denies cough/sob

## 2012-02-27 NOTE — ED Provider Notes (Signed)
Medical screening examination/treatment/procedure(s) were performed by non-physician practitioner and as supervising physician I was immediately available for consultation/collaboration.   Benny Lennert, MD 02/27/12 2051

## 2012-08-11 ENCOUNTER — Emergency Department (HOSPITAL_COMMUNITY)
Admission: EM | Admit: 2012-08-11 | Discharge: 2012-08-11 | Discharge status: Against Medical Advice | Payer: Self-pay | Attending: Emergency Medicine | Admitting: Emergency Medicine

## 2012-08-11 ENCOUNTER — Encounter (HOSPITAL_COMMUNITY): Payer: Self-pay | Admitting: Emergency Medicine

## 2012-08-11 DIAGNOSIS — IMO0002 Reserved for concepts with insufficient information to code with codable children: Secondary | ICD-10-CM | POA: Insufficient documentation

## 2012-08-11 DIAGNOSIS — I10 Essential (primary) hypertension: Secondary | ICD-10-CM | POA: Insufficient documentation

## 2012-08-11 NOTE — ED Notes (Signed)
Patient left without being seen; states her ride is out front and she has to leave.

## 2012-08-11 NOTE — ED Notes (Signed)
Pt states her ride was going to leave her. Pt refused to sign AMA

## 2012-08-11 NOTE — ED Notes (Signed)
Patient states that her ride is out front and she has to leave because she doesn't have a ride home.  Patient left without being seen.

## 2012-08-11 NOTE — ED Notes (Signed)
Patient c/o abscess under left underarm x 3 months.  Patient has been trying OTC medications to get rid of it, but it continues to get bigger.

## 2012-09-03 ENCOUNTER — Encounter (HOSPITAL_COMMUNITY): Payer: Self-pay | Admitting: *Deleted

## 2012-09-03 ENCOUNTER — Emergency Department (HOSPITAL_COMMUNITY)
Admission: EM | Admit: 2012-09-03 | Discharge: 2012-09-03 | Disposition: A | Discharge status: Home / Self Care | Payer: Self-pay | Attending: Emergency Medicine | Admitting: Emergency Medicine

## 2012-09-03 DIAGNOSIS — R609 Edema, unspecified: Secondary | ICD-10-CM | POA: Insufficient documentation

## 2012-09-03 DIAGNOSIS — Z87891 Personal history of nicotine dependence: Secondary | ICD-10-CM | POA: Insufficient documentation

## 2012-09-03 DIAGNOSIS — H5789 Other specified disorders of eye and adnexa: Secondary | ICD-10-CM | POA: Insufficient documentation

## 2012-09-03 DIAGNOSIS — Z8659 Personal history of other mental and behavioral disorders: Secondary | ICD-10-CM | POA: Insufficient documentation

## 2012-09-03 DIAGNOSIS — Z8679 Personal history of other diseases of the circulatory system: Secondary | ICD-10-CM | POA: Insufficient documentation

## 2012-09-03 DIAGNOSIS — I10 Essential (primary) hypertension: Secondary | ICD-10-CM | POA: Insufficient documentation

## 2012-09-03 DIAGNOSIS — H1130 Conjunctival hemorrhage, unspecified eye: Secondary | ICD-10-CM | POA: Insufficient documentation

## 2012-09-03 LAB — BASIC METABOLIC PANEL
Chloride: 96 mEq/L (ref 96–112)
GFR calc Af Amer: >90 mL/min (ref >90)
GFR calc non Af Amer: >90 mL/min (ref >90)
Glucose, Bld: 94 mg/dL (ref 70–99)
Potassium: 3.9 mEq/L (ref 3.5–5.1)
Sodium: 134 mEq/L — ABNORMAL LOW (ref 135–145)

## 2012-09-03 LAB — CBC WITH DIFFERENTIAL/PLATELET
Eosinophils Absolute: 0.1 10*3/uL (ref 0.0–0.7)
Hemoglobin: 10.9 g/dL — ABNORMAL LOW (ref 12.0–15.0)
Lymphs Abs: 1.9 10*3/uL (ref 0.7–4.0)
MCH: 30.3 pg (ref 26.0–34.0)
Neutro Abs: 7.9 10*3/uL — ABNORMAL HIGH (ref 1.7–7.7)
Neutrophils Relative %: 76 % (ref 43–77)
Platelets: 256 10*3/uL (ref 150–400)
RBC: 3.6 MIL/uL — ABNORMAL LOW (ref 3.87–5.11)
WBC: 10.4 10*3/uL (ref 4.0–10.5)

## 2012-09-03 MED ORDER — HYDROCHLOROTHIAZIDE 25 MG PO TABS: 25.0000 mg | ORAL_TABLET | Freq: Every day | ORAL | Status: DC

## 2012-09-03 NOTE — ED Notes (Signed)
Patient with no complaints at this time. Respirations even and unlabored. Skin warm/dry. Discharge instructions reviewed with patient at this time. Patient given opportunity to voice concerns/ask questions. Patient discharged at this time and left Emergency Department with steady gait.   

## 2012-09-03 NOTE — ED Notes (Addendum)
Pt. Stopped taking BP meds six months ago b/c she didn't like the way they made her feel.  Two days ago began to have symptoms from HBP including swelling in feet and legs, some HA and broken blood vessel in L eye.

## 2012-09-03 NOTE — ED Provider Notes (Signed)
History  This chart was scribed for Whitney Lennert, MD, by Candelaria Stagers, ED Scribe. This patient was seen in room APA04/APA04 and the patient's care was started at 3:17 PM  CSN: [id number] Arrival date & time 09/03/12  1353  First MD Initiated Contact with Patient 09/03/12 1514     Chief Complaint  Patient presents with  . Hypertension  . Leg Swelling     Patient is a 42 y.o. female presenting with hypertension. The history is provided by the patient. No language interpreter was used.  Hypertension This is a chronic problem. The current episode started more than 1 week ago. The problem occurs constantly. The problem has been gradually worsening (bilateral leg swelling onset 2 days ago). Pertinent negatives include no abdominal pain and no headaches. Nothing aggravates the symptoms. Nothing relieves the symptoms. She has tried nothing for the symptoms. The treatment provided no relief.   HPI Comments: Whitney Bush is a 42 y.o. female who presents to the Emergency Department complaining of bilateral lower leg swelling that started two days ago.  Pt is also experiencing redness to the left sclera that started several days ago.  Pt has h/o HTN and reports she stopped taking BP meds six months ago.  Nothing improves or worsens sx.      Past Medical History  Diagnosis Date  . Hypertension   . Depression   . Migraines    History reviewed. No pertinent past surgical history. Family History  Problem Relation Age of Onset  . Diabetes Father   . Cancer Other    History  Substance Use Topics  . Smoking status: Former Smoker -- 1.00 packs/day for 5 years    Types: Cigarettes    Quit date: 12/29/2010  . Smokeless tobacco: Current User    Types: Chew  . Alcohol Use: No   OB History   Grav Para Term Preterm Abortions TAB SAB Ect Mult Living   2 1 1  1     1      Review of Systems  Eyes: Positive for redness (redness to left sclera).  Cardiovascular: Positive for leg swelling  (bilateral leg swelling).  Gastrointestinal: Negative for abdominal pain.  Neurological: Negative for headaches.  All other systems reviewed and are negative.    Allergies  Review of patient's allergies indicates no known allergies.  Home Medications   Current Outpatient Rx  Name  Route  Sig  Dispense  Refill  . ibuprofen (ADVIL,MOTRIN) 200 MG tablet   Oral   Take 400 mg by mouth every 6 (six) hours as needed. For pain          BP 187/106  Pulse 107  Temp(Src) 99 F (37.2 C) (Oral)  Resp 20  Ht 5\' 2"  (1.575 m)  Wt 242 lb 2 oz (109.827 kg)  BMI 44.27 kg/m2  SpO2 100%  LMP 08/30/2012 Physical Exam  Nursing note and vitals reviewed. Constitutional: She is oriented to person, place, and time. She appears well-developed and well-nourished. No distress.  HENT:  Head: Normocephalic and atraumatic.  Eyes: EOM are normal.  subconjuntival hemorrhage to left eye.   Neck: Normal range of motion. Neck supple.  Cardiovascular: Normal rate.   Pulmonary/Chest: Effort normal. No respiratory distress.  Musculoskeletal: Normal range of motion. She exhibits edema (1+ edema to bilateral lower legs).  Neurological: She is alert and oriented to person, place, and time.  Skin: Skin is warm and dry.  Psychiatric: She has a normal mood and affect.  Her behavior is normal.    ED Course  Procedures   DIAGNOSTIC STUDIES: Oxygen Saturation is 100% on room air, normal by my interpretation.    COORDINATION OF CARE:  3:19 PM Discussed course of care with pt which includes BP medication.  Pt understands and agrees.   4:43 PM Discussed results with pt and need to take BP medication.  Advised pt to follow up with PCP.    Labs Reviewed  CBC WITH DIFFERENTIAL - Abnormal; Notable for the following:    RBC 3.60 (*)    Hemoglobin 10.9 (*)    HCT 33.7 (*)    RDW 17.7 (*)    Neutro Abs 7.9 (*)    All other components within normal limits  BASIC METABOLIC PANEL - Abnormal; Notable for the  following:    Sodium 134 (*)    BUN <3 (*)    Creatinine, Ser 0.48 (*)    All other components within normal limits   No results found. No diagnosis found.  MDM  The chart was scribed for me under my direct supervision.  I personally performed the history, physical, and medical decision making and all procedures in the evaluation of this patient.Whitney Lennert, MD 09/03/12 [phone]

## 2012-09-03 NOTE — ED Notes (Signed)
MD at bedside. 

## 2013-02-20 ENCOUNTER — Encounter (HOSPITAL_COMMUNITY): Payer: Self-pay | Admitting: Emergency Medicine

## 2013-02-20 ENCOUNTER — Emergency Department (HOSPITAL_COMMUNITY)
Admission: EM | Admit: 2013-02-20 | Discharge: 2013-02-20 | Disposition: A | Discharge status: Home / Self Care | Payer: Self-pay | Attending: Emergency Medicine | Admitting: Emergency Medicine

## 2013-02-20 DIAGNOSIS — M7989 Other specified soft tissue disorders: Secondary | ICD-10-CM | POA: Insufficient documentation

## 2013-02-20 DIAGNOSIS — M79671 Pain in right foot: Secondary | ICD-10-CM

## 2013-02-20 DIAGNOSIS — I10 Essential (primary) hypertension: Secondary | ICD-10-CM | POA: Insufficient documentation

## 2013-02-20 DIAGNOSIS — Z8659 Personal history of other mental and behavioral disorders: Secondary | ICD-10-CM | POA: Insufficient documentation

## 2013-02-20 DIAGNOSIS — Z79899 Other long term (current) drug therapy: Secondary | ICD-10-CM | POA: Insufficient documentation

## 2013-02-20 DIAGNOSIS — Z87891 Personal history of nicotine dependence: Secondary | ICD-10-CM | POA: Insufficient documentation

## 2013-02-20 DIAGNOSIS — M79609 Pain in unspecified limb: Secondary | ICD-10-CM | POA: Insufficient documentation

## 2013-02-20 DIAGNOSIS — R209 Unspecified disturbances of skin sensation: Secondary | ICD-10-CM | POA: Insufficient documentation

## 2013-02-20 HISTORY — DX: Localized edema: R60.0

## 2013-02-20 HISTORY — DX: Edema, unspecified: R60.9

## 2013-02-20 HISTORY — DX: Alcohol abuse, uncomplicated: F10.10

## 2013-02-20 MED ORDER — NAPROXEN 500 MG PO TABS: 500.0000 mg | ORAL_TABLET | Freq: Two times a day (BID) | ORAL | Status: DC

## 2013-02-20 MED ORDER — OXYCODONE-ACETAMINOPHEN 5-325 MG PO TABS
2.0000 | ORAL_TABLET | Freq: Once | ORAL | Status: AC
  Administered 2013-02-20: 2 via ORAL
  Filled 2013-02-20: qty 2

## 2013-02-20 MED ORDER — PREDNISONE 50 MG PO TABS
60.0000 mg | ORAL_TABLET | Freq: Once | ORAL | Status: AC
  Administered 2013-02-20: 60 mg via ORAL
  Filled 2013-02-20 (×2): qty 1

## 2013-02-20 MED ORDER — PREDNISONE 20 MG PO TABS: ORAL_TABLET | ORAL | Status: DC

## 2013-02-20 NOTE — ED Notes (Signed)
Swelling , numbness of both feet and lower legs.for 2 days

## 2013-02-20 NOTE — ED Provider Notes (Signed)
CSN: [id number]     Arrival date & time 02/20/13  1612 History   First MD Initiated Contact with Patient 02/20/13 1702     This chart was scribed for Whitney Hutching, MD by Manuela Schwartz, ED scribe. This patient was seen in room APA06/APA06 and the patient's care was started at 1702.  No chief complaint on file.  The history is provided by the patient. No language interpreter was used.   HPI Comments: Whitney Bush is a 42 y.o. female who presents to the Emergency Department complaining of burning pain and parasthesias to both of her great toes, no trauma, onset 3 days ago. She reports unsure of cause to her pain but worried about gout or maybe becoming diabetic. She reports no hx DM or gout. She states associated swelling to her feet that improves w/elevation.  She has a hx of HTN.  Past Medical History  Diagnosis Date  . Hypertension   . Depression   . Migraines   . Peripheral edema   . Alcohol abuse    History reviewed. No pertinent past surgical history. Family History  Problem Relation Age of Onset  . Diabetes Father   . Cancer Other    History  Substance Use Topics  . Smoking status: Former Smoker -- 1.00 packs/day for 5 years    Types: Cigarettes    Quit date: 12/29/2010  . Smokeless tobacco: Current User    Types: Chew  . Alcohol Use: Yes   OB History   Grav Para Term Preterm Abortions TAB SAB Ect Mult Living   2 1 1  1     1      Review of Systems  Constitutional: Negative for fever and chills.  HENT: Negative for congestion and rhinorrhea.   Respiratory: Negative for cough and shortness of breath.   Cardiovascular: Negative for chest pain.  Gastrointestinal: Negative for nausea, vomiting, abdominal pain and diarrhea.  Musculoskeletal: Negative for back pain.  Skin: Negative for color change and rash.  Neurological: Negative for syncope.       Paraesthesias and burning pain bilateral feet  All other systems reviewed and are negative.   A complete 10 system  review of systems was obtained and all systems are negative except as noted in the HPI and PMH.   Allergies  Review of patient's allergies indicates no known allergies.  Home Medications   Current Outpatient Rx  Name  Route  Sig  Dispense  Refill  . hydrochlorothiazide (HYDRODIURIL) 25 MG tablet   Oral   Take 1 tablet (25 mg total) by mouth daily.   30 tablet   0   . ibuprofen (ADVIL,MOTRIN) 200 MG tablet   Oral   Take 400 mg by mouth every 6 (six) hours as needed. For pain          Triage Vitals: BP 119/78  Pulse 110  Temp(Src) 99 F (37.2 C) (Oral)  Resp 20  Ht 5\' 3"  (1.6 m)  Wt 235 lb (106.595 kg)  BMI 41.64 kg/m2  SpO2 100%  LMP 01/31/2013 Physical Exam  Nursing note and vitals reviewed. Constitutional: She is oriented to person, place, and time. She appears well-developed and well-nourished.  HENT:  Head: Normocephalic and atraumatic.  Eyes: Conjunctivae and EOM are normal. Pupils are equal, round, and reactive to light.  Neck: Normal range of motion. Neck supple.  Cardiovascular: Normal rate, regular rhythm and normal heart sounds.   Pulmonary/Chest: Effort normal and breath sounds normal.  Abdominal: Soft. Bowel  sounds are normal.  Musculoskeletal: Normal range of motion. She exhibits no edema and no tenderness.  No edema/tenderness to either feet or big toes  Neurological: She is alert and oriented to person, place, and time.  Skin: Skin is warm and dry.  Psychiatric: She has a normal mood and affect. Her behavior is normal.    ED Course  Procedures (including critical care time) DIAGNOSTIC STUDIES: Oxygen Saturation is 100% on room air, normal by my interpretation.    COORDINATION OF CARE: At 505 PM Discussed treatment plan with patient which includes pain medicine, CBG, prednisone. Patient agrees.   Labs Review Labs Reviewed  GLUCOSE, CAPILLARY   Imaging Review No results found.  EKG Interpretation   None       MDM  No diagnosis  found. No obvious foot pathology. Glucose normal. Rx Naprosyn and prednisone.   I personally performed the services described in this documentation, which was scribed in my presence. The recorded information has been reviewed and is accurate.      Whitney Hutching, MD 02/20/13 [phone]

## 2013-03-07 ENCOUNTER — Emergency Department (HOSPITAL_COMMUNITY)
Admission: EM | Admit: 2013-03-07 | Discharge: 2013-03-07 | Disposition: A | Discharge status: Home / Self Care | Payer: Self-pay | Attending: Emergency Medicine | Admitting: Emergency Medicine

## 2013-03-07 ENCOUNTER — Encounter (HOSPITAL_COMMUNITY): Payer: Self-pay | Admitting: Emergency Medicine

## 2013-03-07 ENCOUNTER — Emergency Department (HOSPITAL_COMMUNITY): Payer: Self-pay

## 2013-03-07 DIAGNOSIS — M79672 Pain in left foot: Secondary | ICD-10-CM

## 2013-03-07 DIAGNOSIS — G43909 Migraine, unspecified, not intractable, without status migrainosus: Secondary | ICD-10-CM | POA: Insufficient documentation

## 2013-03-07 DIAGNOSIS — I10 Essential (primary) hypertension: Secondary | ICD-10-CM | POA: Insufficient documentation

## 2013-03-07 DIAGNOSIS — F3289 Other specified depressive episodes: Secondary | ICD-10-CM | POA: Insufficient documentation

## 2013-03-07 DIAGNOSIS — R209 Unspecified disturbances of skin sensation: Secondary | ICD-10-CM | POA: Insufficient documentation

## 2013-03-07 DIAGNOSIS — Z87891 Personal history of nicotine dependence: Secondary | ICD-10-CM | POA: Insufficient documentation

## 2013-03-07 DIAGNOSIS — F329 Major depressive disorder, single episode, unspecified: Secondary | ICD-10-CM | POA: Insufficient documentation

## 2013-03-07 DIAGNOSIS — M79671 Pain in right foot: Secondary | ICD-10-CM

## 2013-03-07 DIAGNOSIS — M79609 Pain in unspecified limb: Secondary | ICD-10-CM | POA: Insufficient documentation

## 2013-03-07 DIAGNOSIS — M1990 Unspecified osteoarthritis, unspecified site: Secondary | ICD-10-CM | POA: Insufficient documentation

## 2013-03-07 LAB — BASIC METABOLIC PANEL
BUN: 6 mg/dL (ref 6–23)
CHLORIDE: 95 meq/L — AB (ref 96–112)
CO2: 27 mEq/L (ref 19–32)
CREATININE: 0.55 mg/dL (ref 0.50–1.10)
Calcium: 9.9 mg/dL (ref 8.4–10.5)
GFR calc non Af Amer: >90 mL/min (ref >90)
GLUCOSE: 105 mg/dL — AB (ref 70–99)
Potassium: 3.5 mEq/L — ABNORMAL LOW (ref 3.7–5.3)
Sodium: 137 mEq/L (ref 137–147)

## 2013-03-07 LAB — URIC ACID: Uric Acid, Serum: 6.3 mg/dL (ref 2.4–7.0)

## 2013-03-07 MED ORDER — DICLOFENAC SODIUM 75 MG PO TBEC: 75.0000 mg | DELAYED_RELEASE_TABLET | Freq: Two times a day (BID) | ORAL | Status: DC

## 2013-03-07 MED ORDER — ACETAMINOPHEN-CODEINE #3 300-30 MG PO TABS: 1.0000 | ORAL_TABLET | Freq: Four times a day (QID) | ORAL | Status: DC | PRN

## 2013-03-07 MED ORDER — KETOROLAC TROMETHAMINE 10 MG PO TABS
10.0000 mg | ORAL_TABLET | Freq: Once | ORAL | Status: AC
  Administered 2013-03-07: 10 mg via ORAL
  Filled 2013-03-07: qty 1

## 2013-03-07 MED ORDER — ACETAMINOPHEN-CODEINE #3 300-30 MG PO TABS
2.0000 | ORAL_TABLET | Freq: Once | ORAL | Status: AC
  Administered 2013-03-07: 2 via ORAL
  Filled 2013-03-07: qty 2

## 2013-03-07 MED ORDER — ONDANSETRON HCL 4 MG PO TABS
4.0000 mg | ORAL_TABLET | Freq: Once | ORAL | Status: AC
  Administered 2013-03-07: 4 mg via ORAL
  Filled 2013-03-07: qty 1

## 2013-03-07 MED ORDER — DEXAMETHASONE 4 MG PO TABS: ORAL_TABLET | ORAL | Status: DC

## 2013-03-07 NOTE — ED Provider Notes (Signed)
Medical screening examination/treatment/procedure(s) were performed by non-physician practitioner and as supervising physician I was immediately available for consultation/collaboration.  EKG Interpretation   None         [name] L Zamani Crocker, MD 03/07/13 1515 

## 2013-03-07 NOTE — Discharge Instructions (Signed)
Your electrolytes, uric acid, and x-rays are nonacute. 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 Please elevate her legs is much as possible. Please use diclofenac and Decadron daily, take with food. Use Tylenol with codeine every 6 hours if needed for pain. This medication may cause drowsiness, please use with caution. Please see the podiatry specialist listed above, or the podiatrist of your choice as sone as possible for evaluation of the arthritis changes, as well as the numbness and tingling that you have described. Arthritis, Nonspecific Arthritis is inflammation of a joint. This usually means pain, redness, warmth or swelling are present. One or more joints may be involved. There are a number of types of arthritis. Your caregiver may not be able to tell what type of arthritis you have right away. CAUSES  The most common cause of arthritis is the wear and tear on the joint (osteoarthritis). This causes damage to the cartilage, which can break down over time. The knees, hips, back and neck are most often affected by this type of arthritis. Other types of arthritis and common causes of joint pain include:  Sprains and other injuries near the joint. Sometimes minor sprains and injuries cause pain and swelling that develop hours later.  Rheumatoid arthritis. This affects hands, feet and knees. It usually affects both sides of your body at the same time. It is often associated with chronic ailments, fever, weight loss and general weakness.  Crystal arthritis. Gout and pseudo gout can cause occasional acute severe pain, redness and swelling in the foot, ankle, or knee.  Infectious arthritis. Bacteria can get into a joint through a break in overlying skin. This can cause infection of the joint. Bacteria and viruses can also spread through the blood and affect your joints.  Drug, infectious and allergy reactions. Sometimes joints can become mildly painful and slightly swollen with  these types of illnesses. SYMPTOMS   Pain is the main symptom.  Your joint or joints can also be red, swollen and warm or hot to the touch.  You may have a fever with certain types of arthritis, or even feel overall ill.  The joint with arthritis will hurt with movement. Stiffness is present with some types of arthritis. DIAGNOSIS  Your caregiver will suspect arthritis based on your description of your symptoms and on your exam. Testing may be needed to find the type of arthritis:  Blood and sometimes urine tests.  X-ray tests and sometimes CT or MRI scans.  Removal of fluid from the joint (arthrocentesis) is done to check for bacteria, crystals or other causes. Your caregiver (or a specialist) will numb the area over the joint with a local anesthetic, and use a needle to remove joint fluid for examination. This procedure is only minimally uncomfortable.  Even with these tests, your caregiver may not be able to tell what kind of arthritis you have. Consultation with a specialist (rheumatologist) may be helpful. TREATMENT  Your caregiver will discuss with you treatment specific to your type of arthritis. If the specific type cannot be determined, then the following general recommendations may apply. Treatment of severe joint pain includes:  Rest.  Elevation.  Anti-inflammatory medication (for example, ibuprofen) may be prescribed. Avoiding activities that cause increased pain.  Only take over-the-counter or prescription medicines for pain and discomfort as recommended by your caregiver.  Cold packs over an inflamed joint may be used for 10 to 15 minutes every hour. Hot packs sometimes feel better, but do not use overnight.  Do not use hot packs if you are diabetic without your caregiver's permission.  A cortisone shot into arthritic joints may help reduce pain and swelling.  Any acute arthritis that gets worse over the next 1 to 2 days needs to be looked at to be sure there is no  joint infection. Long-term arthritis treatment involves modifying activities and lifestyle to reduce joint stress jarring. This can include weight loss. Also, exercise is needed to nourish the joint cartilage and remove waste. This helps keep the muscles around the joint strong. HOME CARE INSTRUCTIONS   Do not take aspirin to relieve pain if gout is suspected. This elevates uric acid levels.  Only take over-the-counter or prescription medicines for pain, discomfort or fever as directed by your caregiver.  Rest the joint as much as possible.  If your joint is swollen, keep it elevated.  Use crutches if the painful joint is in your leg.  Drinking plenty of fluids may help for certain types of arthritis.  Follow your caregiver's dietary instructions.  Try low-impact exercise such as:  Swimming.  Water aerobics.  Biking.  Walking.  Morning stiffness is often relieved by a warm shower.  Put your joints through regular range-of-motion. SEEK MEDICAL CARE IF:   You do not feel better in 24 hours or are getting worse.  You have side effects to medications, or are not getting better with treatment. SEEK IMMEDIATE MEDICAL CARE IF:   You have a fever.  You develop severe joint pain, swelling or redness.  Many joints are involved and become painful and swollen.  There is severe back pain and/or leg weakness.  You have loss of bowel or bladder control. Document Released: 03/23/2004 Document Revised: 05/08/2011 Document Reviewed: 04/08/2008 [facility] Patient Information 2014 [employer].

## 2013-03-07 NOTE — ED Provider Notes (Signed)
CSN: [id number]     Arrival date & time 03/07/13  0857 History   First MD Initiated Contact with Patient 03/07/13 0914     Chief Complaint  Patient presents with  . Foot Pain   (Consider location/radiation/quality/duration/timing/severity/associated sxs/prior Treatment) HPI Comments: Pt had a similar event on 12/25 with burning, tingling of the right and left 1st toes, and some swelling of the feet. She has a hx of hypertension and peripheral edema. No known injury recently. Not taking any meds for blood pressure. States she was diagnosed with gout, but has not had improvement with antiinflammatory medications.  Patient is a 43 y.o. female presenting with lower extremity pain. The history is provided by the patient.  Foot Pain This is a recurrent problem. The current episode started 1 to 4 weeks ago. The problem occurs intermittently. The problem has been gradually worsening. Associated symptoms include arthralgias and headaches. Pertinent negatives include no abdominal pain, chest pain, coughing, fever, neck pain, numbness or vomiting. The symptoms are aggravated by standing and walking. She has tried NSAIDs for the symptoms. The treatment provided no relief.    Past Medical History  Diagnosis Date  . Hypertension   . Depression   . Migraines   . Peripheral edema   . Alcohol abuse    History reviewed. No pertinent past surgical history. Family History  Problem Relation Age of Onset  . Diabetes Father   . Cancer Other    History  Substance Use Topics  . Smoking status: Former Smoker -- 1.00 packs/day for 5 years    Types: Cigarettes    Quit date: 12/29/2010  . Smokeless tobacco: Current User    Types: Chew  . Alcohol Use: Yes     Comment: occ   OB History   Grav Para Term Preterm Abortions TAB SAB Ect Mult Living   2 1 1  1     1      Review of Systems  Constitutional: Negative for fever and activity change.       All ROS Neg except as noted in HPI  HENT: Negative for  nosebleeds.   Eyes: Negative for photophobia and discharge.  Respiratory: Negative for cough, shortness of breath and wheezing.   Cardiovascular: Negative for chest pain and palpitations.  Gastrointestinal: Negative for vomiting, abdominal pain and blood in stool.  Genitourinary: Negative for dysuria, frequency and hematuria.  Musculoskeletal: Positive for arthralgias and gait problem. Negative for back pain and neck pain.  Skin: Negative.   Neurological: Positive for headaches. Negative for dizziness, seizures, speech difficulty and numbness.  Psychiatric/Behavioral: Negative for hallucinations and confusion.       Depression    Allergies  Review of patient's allergies indicates no known allergies.  Home Medications   Current Outpatient Rx  Name  Route  Sig  Dispense  Refill  . ibuprofen (ADVIL,MOTRIN) 200 MG tablet   Oral   Take 400 mg by mouth every 6 (six) hours as needed. For pain          BP 145/93  Pulse 107  Temp(Src) 98.7 F (37.1 C) (Oral)  Resp 20  Ht 5\' 3"  (1.6 m)  Wt 235 lb (106.595 kg)  BMI 41.64 kg/m2  SpO2 100%  LMP 02/28/2013 Physical Exam  Nursing note and vitals reviewed. Constitutional: She is oriented to person, place, and time. She appears well-developed and well-nourished.  Non-toxic appearance.  HENT:  Head: Normocephalic.  Right Ear: Tympanic membrane and external ear normal.  Left Ear: Tympanic  membrane and external ear normal.  Eyes: EOM and lids are normal. Pupils are equal, round, and reactive to light.  Neck: Normal range of motion. Neck supple. Carotid bruit is not present.  Cardiovascular: Normal rate, regular rhythm, normal heart sounds, intact distal pulses and normal pulses.   Pulmonary/Chest: Breath sounds normal. No respiratory distress.  Abdominal: Soft. Bowel sounds are normal. There is no tenderness. There is no guarding.  Musculoskeletal: Normal range of motion.  There is full range of motion of the right and left hip. There  is some stiffness and some crepitus with movement of the right and left knees. There is no pitting edema of the lower right or left extremities. There is some mild puffiness of both feet. The Achilles tendons are intact bilaterally. The dorsalis pedis pulses are 2+ bilaterally. There no lesions between the toes of the right or left foot. There is some generalized aching with movement and some palpation of the dorsum and plantar surface of the right and left feet. There is soreness to the calcaneal area and the insertion area of the Achilles tendon on the right and the left. There is no ridge treating. There no hot areas appreciated.  Lymphadenopathy:       Head (right side): No submandibular adenopathy present.       Head (left side): No submandibular adenopathy present.    She has no cervical adenopathy.  Neurological: She is alert and oriented to person, place, and time. She has normal strength. No cranial nerve deficit or sensory deficit.  Skin: Skin is warm and dry.  Psychiatric: She has a normal mood and affect. Her speech is normal.    ED Course  Procedures (including critical care time) Labs Review Labs Reviewed  URIC ACID  BASIC METABOLIC PANEL   Imaging Review No results found.  EKG Interpretation   None       MDM  No diagnosis found. *I have reviewed nursing notes, vital signs, and all appropriate lab and imaging results for this patient.**  The pulse rate is elevated at 107. The remainder the vital signs are well within normal limits. The pulse oximetry is 100% on room air.  The uric acid is normal at 6.3. The basic metabolic panel shows the potassium to be slightly low at 3.5, chloride low at 95, glucose slightly elevated at 105. The remainder of the basic metabolic panel is well within normal limits. X-ray of the left foot is negative for fracture, dislocation, or foreign body. There is noted some mild degenerative joint changes of the mid foot area.  I suspect the  patient has inflammatory attack of her feet, he specially given the extremely low temperatures over last couple days. Patient also states that she has been exercising and riding her bicycle recently. The plan at this time is for the patient to be placed on Decadron, diclofenac, and Tylenol with Codeine. Patient is strongly encouraged to see the podiatry specialist for additional evaluation and possible injection of her painful areas.  Lenox Ahr, PA-C 03/07/13 1111

## 2013-03-07 NOTE — ED Notes (Signed)
Left foot pain x 2 wks.  Denies injury.  Seen here and dx with Gout and given antiinflammatory with no relief. Also reports swelling to bottom of foot.

## 2013-07-29 ENCOUNTER — Encounter (HOSPITAL_COMMUNITY): Payer: Self-pay | Admitting: Emergency Medicine

## 2013-07-29 ENCOUNTER — Emergency Department (HOSPITAL_COMMUNITY)
Admission: EM | Admit: 2013-07-29 | Discharge: 2013-07-29 | Disposition: A | Discharge status: Home / Self Care | Payer: Self-pay | Attending: Emergency Medicine | Admitting: Emergency Medicine

## 2013-07-29 DIAGNOSIS — Z87891 Personal history of nicotine dependence: Secondary | ICD-10-CM | POA: Insufficient documentation

## 2013-07-29 DIAGNOSIS — L0291 Cutaneous abscess, unspecified: Secondary | ICD-10-CM

## 2013-07-29 DIAGNOSIS — Z792 Long term (current) use of antibiotics: Secondary | ICD-10-CM | POA: Insufficient documentation

## 2013-07-29 DIAGNOSIS — IMO0002 Reserved for concepts with insufficient information to code with codable children: Secondary | ICD-10-CM | POA: Insufficient documentation

## 2013-07-29 DIAGNOSIS — Z79899 Other long term (current) drug therapy: Secondary | ICD-10-CM | POA: Insufficient documentation

## 2013-07-29 DIAGNOSIS — Z8659 Personal history of other mental and behavioral disorders: Secondary | ICD-10-CM | POA: Insufficient documentation

## 2013-07-29 DIAGNOSIS — I10 Essential (primary) hypertension: Secondary | ICD-10-CM | POA: Insufficient documentation

## 2013-07-29 MED ORDER — LIDOCAINE HCL (PF) 1 % IJ SOLN
INTRAMUSCULAR | Status: AC
  Administered 2013-07-29: 11:00:00
  Filled 2013-07-29: qty 5

## 2013-07-29 MED ORDER — ACETAMINOPHEN-CODEINE #3 300-30 MG PO TABS: 1.0000 | ORAL_TABLET | Freq: Four times a day (QID) | ORAL | Status: DC | PRN

## 2013-07-29 MED ORDER — CLINDAMYCIN HCL 300 MG PO CAPS: 300.0000 mg | ORAL_CAPSULE | Freq: Four times a day (QID) | ORAL | Status: DC

## 2013-07-29 NOTE — ED Provider Notes (Signed)
CSN: [id number]     Arrival date & time 07/29/13  1023 History   First MD Initiated Contact with Patient 07/29/13 1026     Chief Complaint  Patient presents with  . Abscess     (Consider location/radiation/quality/duration/timing/severity/associated sxs/prior Treatment) Patient is a 43 y.o. female presenting with abscess. No language interpreter was used.  Abscess Location:  Shoulder/arm Shoulder/arm abscess location:  L axilla Abscess quality: painful and redness   Red streaking: no   Progression:  Worsening Pain details:    Quality:  Throbbing   Severity:  Moderate   Timing:  Constant   Progression:  Unchanged Relieved by:  Nothing Ineffective treatments:  Draining/squeezing and warm compresses Associated symptoms: no fever   Risk factors: prior abscess     Past Medical History  Diagnosis Date  . Hypertension   . Depression   . Migraines   . Peripheral edema   . Alcohol abuse    History reviewed. No pertinent past surgical history. Family History  Problem Relation Age of Onset  . Diabetes Father   . Cancer Other    History  Substance Use Topics  . Smoking status: Former Smoker -- 1.00 packs/day for 5 years    Types: Cigarettes    Quit date: 12/29/2010  . Smokeless tobacco: Current User    Types: Chew  . Alcohol Use: Yes     Comment: occ   OB History   Grav Para Term Preterm Abortions TAB SAB Ect Mult Living   2 1 1  1     1      Review of Systems  Constitutional: Negative for fever.  Respiratory: Negative.   Cardiovascular: Negative.       Allergies  Review of patient's allergies indicates no known allergies.  Home Medications   Prior to Admission medications   Medication Sig Start Date End Date Taking? Authorizing Provider  acetaminophen-codeine (TYLENOL #3) 300-30 MG per tablet Take 1-2 tablets by mouth every 6 (six) hours as needed for moderate pain. 03/07/13   Lenox Ahr, PA-C  acetaminophen-codeine (TYLENOL #3) 300-30 MG per tablet Take  1-2 tablets by mouth every 6 (six) hours as needed for moderate pain. 07/29/13   Glendell Docker, NP  clindamycin (CLEOCIN) 300 MG capsule Take 1 capsule (300 mg total) by mouth every 6 (six) hours. 07/29/13   Glendell Docker, NP  dexamethasone (DECADRON) 4 MG tablet 1 po bid with food 03/07/13   Lenox Ahr, PA-C  diclofenac (VOLTAREN) 75 MG EC tablet Take 1 tablet (75 mg total) by mouth 2 (two) times daily. 03/07/13   Lenox Ahr, PA-C  ibuprofen (ADVIL,MOTRIN) 200 MG tablet Take 400 mg by mouth every 6 (six) hours as needed. For pain    Historical Provider, MD   BP 159/107  Pulse 96  Temp(Src) 98.5 F (36.9 C) (Oral)  Resp 18  SpO2 100%  LMP 07/26/2013 Physical Exam  Nursing note and vitals reviewed. Constitutional: She is oriented to person, place, and time. She appears well-developed and well-nourished.  Cardiovascular: Normal rate and regular rhythm.   Pulmonary/Chest: Breath sounds normal.  Musculoskeletal: Normal range of motion.  Neurological: She is oriented to person, place, and time.  Skin:  Large fluctuant area to the left axilla. Mild redness noted    ED Course  INCISION AND DRAINAGE Date/Time: 07/29/2013 10:59 AM Performed by: Glendell Docker Authorized by: Glendell Docker Consent: Verbal consent obtained. Risks and benefits: risks, benefits and alternatives were discussed Consent given by: patient Patient  identity confirmed: verbally with patient Type: abscess Body area: upper extremity (left axilla) Anesthesia: local infiltration Local anesthetic: lidocaine 2% without epinephrine Scalpel size: 11 Incision type: single straight Complexity: simple Drainage: purulent Wound treatment: wound left open Packing material: 1/2 in gauze Patient tolerance: Patient tolerated the procedure well with no immediate complications.   (including critical care time) Labs Review Labs Reviewed - No data to display  Imaging Review No results found.   EKG  Interpretation None      MDM   Final diagnoses:  Abscess    Will treat with antibiotics and pain medication. Pt instructed on return for packing removal. Tetanus utd    Glendell Docker, NP 07/29/13 1100

## 2013-07-29 NOTE — ED Notes (Signed)
Pt presents with abscess-like area to left axillary area. Pt denies drainage from site but reports tenderness.

## 2013-07-29 NOTE — Discharge Instructions (Signed)
Abscess An abscess is an infected area that contains a collection of pus and debris.It can occur in almost any part of the body. An abscess is also known as a furuncle or boil. CAUSES  An abscess occurs when tissue gets infected. This can occur from blockage of oil or sweat glands, infection of hair follicles, or a minor injury to the skin. As the body tries to fight the infection, pus collects in the area and creates pressure under the skin. This pressure causes pain. People with weakened immune systems have difficulty fighting infections and get certain abscesses more often.  SYMPTOMS Usually an abscess develops on the skin and becomes a painful mass that is red, warm, and tender. If the abscess forms under the skin, you may feel a moveable soft area under the skin. Some abscesses break open (rupture) on their own, but most will continue to get worse without care. The infection can spread deeper into the body and eventually into the bloodstream, causing you to feel ill.  DIAGNOSIS  Your caregiver will take your medical history and perform a physical exam. A sample of fluid may also be taken from the abscess to determine what is causing your infection. TREATMENT  Your caregiver may prescribe antibiotic medicines to fight the infection. However, taking antibiotics alone usually does not cure an abscess. Your caregiver may need to make a small cut (incision) in the abscess to drain the pus. In some cases, gauze is packed into the abscess to reduce pain and to continue draining the area. HOME CARE INSTRUCTIONS   Only take over-the-counter or prescription medicines for pain, discomfort, or fever as directed by your caregiver.  If you were prescribed antibiotics, take them as directed. Finish them even if you start to feel better.  If gauze is used, follow your caregiver's directions for changing the gauze.  To avoid spreading the infection:  Keep your draining abscess covered with a  bandage.  Wash your hands well.  Do not share personal care items, towels, or whirlpools with others.  Avoid skin contact with others.  Keep your skin and clothes clean around the abscess.  Keep all follow-up appointments as directed by your caregiver. SEEK MEDICAL CARE IF:   You have increased pain, swelling, redness, fluid drainage, or bleeding.  You have muscle aches, chills, or a general ill feeling.  You have a fever. MAKE SURE YOU:   Understand these instructions.  Will watch your condition.  Will get help right away if you are not doing well or get worse. Document Released: 11/23/2004 Document Revised: 08/15/2011 Document Reviewed: 04/28/2011 [employer] Patient Information 2014 [employer].  

## 2013-07-29 NOTE — ED Provider Notes (Signed)
Medical screening examination/treatment/procedure(s) were performed by non-physician practitioner and as supervising physician I was immediately available for consultation/collaboration.   EKG Interpretation None       Jasper Riling. Alvino Chapel, MD 07/29/13 1328

## 2013-07-31 ENCOUNTER — Emergency Department (HOSPITAL_COMMUNITY)
Admission: EM | Admit: 2013-07-31 | Discharge: 2013-07-31 | Disposition: A | Discharge status: Home / Self Care | Payer: Self-pay | Attending: Emergency Medicine | Admitting: Emergency Medicine

## 2013-07-31 ENCOUNTER — Encounter (HOSPITAL_COMMUNITY): Payer: Self-pay | Admitting: Emergency Medicine

## 2013-07-31 DIAGNOSIS — R42 Dizziness and giddiness: Secondary | ICD-10-CM | POA: Insufficient documentation

## 2013-07-31 DIAGNOSIS — Z79899 Other long term (current) drug therapy: Secondary | ICD-10-CM | POA: Insufficient documentation

## 2013-07-31 DIAGNOSIS — H53149 Visual discomfort, unspecified: Secondary | ICD-10-CM | POA: Insufficient documentation

## 2013-07-31 DIAGNOSIS — Z5189 Encounter for other specified aftercare: Secondary | ICD-10-CM

## 2013-07-31 DIAGNOSIS — Z4801 Encounter for change or removal of surgical wound dressing: Secondary | ICD-10-CM | POA: Insufficient documentation

## 2013-07-31 DIAGNOSIS — L732 Hidradenitis suppurativa: Secondary | ICD-10-CM | POA: Insufficient documentation

## 2013-07-31 DIAGNOSIS — Z792 Long term (current) use of antibiotics: Secondary | ICD-10-CM | POA: Insufficient documentation

## 2013-07-31 DIAGNOSIS — Z8659 Personal history of other mental and behavioral disorders: Secondary | ICD-10-CM | POA: Insufficient documentation

## 2013-07-31 DIAGNOSIS — M7989 Other specified soft tissue disorders: Secondary | ICD-10-CM | POA: Insufficient documentation

## 2013-07-31 DIAGNOSIS — I10 Essential (primary) hypertension: Secondary | ICD-10-CM | POA: Insufficient documentation

## 2013-07-31 DIAGNOSIS — Z87891 Personal history of nicotine dependence: Secondary | ICD-10-CM | POA: Insufficient documentation

## 2013-07-31 MED ORDER — LISINOPRIL-HYDROCHLOROTHIAZIDE 20-12.5 MG PO TABS: 1.0000 | ORAL_TABLET | Freq: Every day | ORAL | Status: DC

## 2013-07-31 NOTE — Discharge Instructions (Signed)
Hidradenitis Suppurativa, Sweat Gland Abscess Hidradenitis suppurativa is a long lasting (chronic), uncommon disease of the sweat glands. With this, boil-like lumps and scarring develop in the groin, some times under the arms (axillae), and under the breasts. It may also uncommonly occur behind the ears, in the crease of the buttocks, and around the genitals.  CAUSES  The cause is from a blocking of the sweat glands. They then become infected. It may cause drainage and odor. It is not contagious. So it cannot be given to someone else. It most often shows up in puberty (about 53 to 41 years of age). But it may happen much later. It is similar to acne which is a disease of the sweat glands. This condition is slightly more common in African-Americans and women. SYMPTOMS   Hidradenitis usually starts as one or more red, tender, swellings in the groin or under the arms (axilla).  Over a period of hours to days the lesions get larger. They often open to the skin surface, draining clear to yellow-colored fluid.  The infected area heals with scarring. DIAGNOSIS  Your caregiver makes this diagnosis by looking at you. Sometimes cultures (growing germs on plates in the lab) may be taken. This is to see what germ (bacterium) is causing the infection.  TREATMENT   Topical germ killing medicine applied to the skin (antibiotics) are the treatment of choice. Antibiotics taken by mouth (systemic) are sometimes needed when the condition is getting worse or is severe.  Avoid tight-fitting clothing which traps moisture in.  Dirt does not cause hidradenitis and it is not caused by poor hygiene.  Involved areas should be cleaned daily using an antibacterial soap. Some patients find that the liquid form of Lever 2000, applied to the involved areas as a lotion after bathing, can help reduce the odor related to this condition.  Sometimes surgery is needed to drain infected areas or remove scarred tissue. Removal of  large amounts of tissue is used only in severe cases.  Birth control pills may be helpful.  Oral retinoids (vitamin A derivatives) for 6 to 12 months which are effective for acne may also help this condition.  Weight loss will improve but not cure hidradenitis. It is made worse by being overweight. But the condition is not caused by being overweight.  This condition is more common in people who have had acne.  It may become worse under stress. There is no medical cure for hidradenitis. It can be controlled, but not cured. The condition usually continues for years with periods of getting worse and getting better (remission). Document Released: 09/28/2003 Document Revised: 05/08/2011 Document Reviewed: 10/14/2007 [facility] Patient Information 2014 [place].  Hypertension As your heart beats, it forces blood through your arteries. This force is your blood pressure. If the pressure is too high, it is called hypertension (HTN) or high blood pressure. HTN is dangerous because you may have it and not know it. High blood pressure may mean that your heart has to work harder to pump blood. Your arteries may be narrow or stiff. The extra work puts you at risk for heart disease, stroke, and other problems.  Blood pressure consists of two numbers, a higher number over a lower, 110/72, for example. It is stated as "110 over 72." The ideal is below 120 for the top number (systolic) and under 80 for the bottom (diastolic). Write down your blood pressure today. You should pay close attention to your blood pressure if you have certain conditions  such as:  Heart failure.  Prior heart attack.  Diabetes  Chronic kidney disease.  Prior stroke.  Multiple risk factors for heart disease. To see if you have HTN, your blood pressure should be measured while you are seated with your arm held at the level of the heart. It should be measured at least twice. A one-time elevated blood pressure reading  (especially in the Emergency Department) does not mean that you need treatment. There may be conditions in which the blood pressure is different between your right and left arms. It is important to see your caregiver soon for a recheck. Most people have essential hypertension which means that there is not a specific cause. This type of high blood pressure may be lowered by changing lifestyle factors such as:  Stress.  Smoking.  Lack of exercise.  Excessive weight.  Drug/tobacco/alcohol use.  Eating less salt. Most people do not have symptoms from high blood pressure until it has caused damage to the body. Effective treatment can often prevent, delay or reduce that damage. TREATMENT  When a cause has been identified, treatment for high blood pressure is directed at the cause. There are a large number of medications to treat HTN. These fall into several categories, and your caregiver will help you select the medicines that are best for you. Medications may have side effects. You should review side effects with your caregiver. If your blood pressure stays high after you have made lifestyle changes or started on medicines,   Your medication(s) may need to be changed.  Other problems may need to be addressed.  Be certain you understand your prescriptions, and know how and when to take your medicine.  Be sure to follow up with your caregiver within the time frame advised (usually within two weeks) to have your blood pressure rechecked and to review your medications.  If you are taking more than one medicine to lower your blood pressure, make sure you know how and at what times they should be taken. Taking two medicines at the same time can result in blood pressure that is too low. SEEK IMMEDIATE MEDICAL CARE IF:  You develop a severe headache, blurred or changing vision, or confusion.  You have unusual weakness or numbness, or a faint feeling.  You have severe chest or abdominal pain,  vomiting, or breathing problems. MAKE SURE YOU:   Understand these instructions.  Will watch your condition.  Will get help right away if you are not doing well or get worse. Document Released: 02/13/2005 Document Revised: 05/08/2011 Document Reviewed: 10/04/2007 [facility] Patient Information 2014 [place].

## 2013-07-31 NOTE — ED Provider Notes (Signed)
CSN: [id number]     Arrival date & time 07/31/13  1023 History  This chart was scribed for Whitney Blade, MD by Rolanda Lundborg, ED Scribe. This patient was seen in room APA15/APA15 and the patient's care was started at 12:03 PM.   Chief Complaint  Patient presents with  . Headache   The history is provided by the patient. No language interpreter was used.   HPI Comments: Whitney Bush is a 43 y.o. female with a h/o HTN who presents to the Emergency Department complaining intermittent dizziness described as unsteadiness for the past week after being off of her lisinopril-HCTZ for one month. She states she ran out and does not have a refill. She states the dizziness worsens when she moves from a sitting to standing position. She also reports a frontal headache and states she felt a "pop" in her left eye this morning. She denies vomiting. She reports seeing spots intermittently. She reports intermittent feet swelling at baseline. She is able to ambulate normally.   She was seen here for an abscess 2 days ago, had an I&D, and was told to come back here to have the packing removed. She rates the severity of the pain as a 5/10. She has a h/o abscesses. She was given a prescription for antibiotic but has not had it filled yet. States she will get it filled today.   Past Medical History  Diagnosis Date  . Hypertension   . Depression   . Migraines   . Peripheral edema   . Alcohol abuse    History reviewed. No pertinent past surgical history. Family History  Problem Relation Age of Onset  . Diabetes Father   . Cancer Other    History  Substance Use Topics  . Smoking status: Former Smoker -- 1.00 packs/day for 5 years    Types: Cigarettes    Quit date: 12/29/2010  . Smokeless tobacco: Current User    Types: Chew  . Alcohol Use: Yes     Comment: occ   OB History   Grav Para Term Preterm Abortions TAB SAB Ect Mult Living   2 1 1  1     1      Review of Systems  Eyes: Positive for  visual disturbance.  Cardiovascular: Positive for leg swelling.  Gastrointestinal: Negative for vomiting.  Skin: Positive for wound.  Neurological: Positive for dizziness and headaches.  All other systems reviewed and are negative.     Allergies  Review of patient's allergies indicates no known allergies.  Home Medications   Prior to Admission medications   Medication Sig Start Date End Date Taking? Authorizing Provider  ibuprofen (ADVIL,MOTRIN) 200 MG tablet Take 400 mg by mouth every 6 (six) hours as needed. For pain   Yes Historical Provider, MD  lisinopril-hydrochlorothiazide (PRINZIDE,ZESTORETIC) 20-25 MG per tablet Take 1 tablet by mouth daily.   Yes Historical Provider, MD  acetaminophen-codeine (TYLENOL #3) 300-30 MG per tablet Take 1-2 tablets by mouth every 6 (six) hours as needed for moderate pain. 07/29/13   Glendell Docker, NP  clindamycin (CLEOCIN) 300 MG capsule Take 1 capsule (300 mg total) by mouth every 6 (six) hours. 07/29/13   Glendell Docker, NP  lisinopril-hydrochlorothiazide (PRINZIDE) 20-12.5 MG per tablet Take 1 tablet by mouth daily. 07/31/13   Whitney Blade, MD   BP 156/113  Pulse 93  Temp(Src) 98.4 F (36.9 C) (Oral)  Resp 18  SpO2 100%  LMP 07/26/2013 Physical Exam  Nursing note and vitals  reviewed. Constitutional: She is oriented to person, place, and time. She appears well-developed and well-nourished. No distress (Nontoxic appearance).  HENT:  Head: Normocephalic and atraumatic.  Eyes: Conjunctivae and EOM are normal. Pupils are equal, round, and reactive to light.  Neck: Normal range of motion and phonation normal. Neck supple.  No meningismus  Cardiovascular: Normal rate, regular rhythm and intact distal pulses.   Pulmonary/Chest: Effort normal and breath sounds normal. She exhibits no tenderness.  Abdominal: Soft. She exhibits no distension. There is no tenderness. There is no guarding.  Musculoskeletal: Normal range of motion.  Left axilla -  packing in a small wound. The entire packing was removed, was about 2 inches. There is mild drainage from the wound after packing removal. Mild induration around the wound but no fluctuance. There are several areas of nodularity consistent with hidradenitis suppurativa.   Neurological: She is alert and oriented to person, place, and time. She exhibits normal muscle tone.  Normal Romberg.  Skin: Skin is warm and dry.  Psychiatric: She has a normal mood and affect. Her behavior is normal. Judgment and thought content normal.    ED Course  Procedures (including critical care time)  DIAGNOSTIC STUDIES: Oxygen Saturation is 100% on RA, normal by my interpretation.    COORDINATION OF CARE: Medications - No data to display  Patient Vitals for the past 24 hrs:  BP Temp Temp src Pulse Resp SpO2  07/31/13 1215 - - - - 17 -  07/31/13 1200 135/98 mmHg - - 83 21 99 %  07/31/13 1145 - - - 85 19 99 %  07/31/13 1130 132/97 mmHg - - 79 16 98 %  07/31/13 1115 142/105 mmHg - - 86 23 97 %  07/31/13 1100 - - - 85 22 96 %  07/31/13 1030 156/113 mmHg 98.4 F (36.9 C) Oral 93 18 100 %    12:13 PM- Discussed treatment plan with pt which includes discharge home with prescription for lisinopril-HCTZ. Pt agrees to plan.    Labs Review Labs Reviewed - No data to display  Imaging Review No results found.   EKG Interpretation None      MDM   Final diagnoses:  Wound check, abscess  Hidradenitis suppurativa of left axilla  Hypertension    Scheduled Recheck, for wound that was packed 2 days ago. The wound continues to drain. She has not yet started antibiotics. She has evidence for chronic axillary hidradenitis suppurativa, without evidence, today for need for additional drainage procedure. She is incidental hypertension that is secondary to noncompliance with medical treatment. I doubt acute brain bleed, hypertensive urgency, metabolic instability.   Nursing Notes Reviewed/ Care  Coordinated Applicable Imaging Reviewed Interpretation of Laboratory Data incorporated into ED treatment  The patient appears reasonably screened and/or stabilized for discharge and I doubt any other medical condition or other [facility] requiring further screening, evaluation, or treatment in the ED at this time prior to discharge.  Plan: Home Medications- restart Lisinopril and HCTZ, encouraged to take prescribed ABX; Home Treatments- Warm compress to left axicilla; return here if the recommended treatment, does not improve the symptoms; Recommended follow up- PCP 1 week for check up  I personally performed the services described in this documentation, which was scribed in my presence. The recorded information has been reviewed and is accurate.     Whitney Blade, MD 07/31/13 [phone]

## 2013-07-31 NOTE — ED Notes (Signed)
Patient presents today with a chief complaint of bilateral eye pain after she suddenly felt her left eye "pop" this morning. Patient has history of hypertension and has not had her medication in 1 month. Patient also reporting left sided headache today with some blurred vision.

## 2013-08-27 ENCOUNTER — Emergency Department (HOSPITAL_COMMUNITY)
Admission: EM | Admit: 2013-08-27 | Discharge: 2013-08-27 | Disposition: A | Discharge status: Home / Self Care | Payer: Self-pay | Attending: Emergency Medicine | Admitting: Emergency Medicine

## 2013-08-27 ENCOUNTER — Emergency Department (HOSPITAL_COMMUNITY): Payer: Self-pay

## 2013-08-27 ENCOUNTER — Encounter (HOSPITAL_COMMUNITY): Payer: Self-pay | Admitting: Emergency Medicine

## 2013-08-27 DIAGNOSIS — I10 Essential (primary) hypertension: Secondary | ICD-10-CM | POA: Insufficient documentation

## 2013-08-27 DIAGNOSIS — Z87891 Personal history of nicotine dependence: Secondary | ICD-10-CM | POA: Insufficient documentation

## 2013-08-27 DIAGNOSIS — S0083XA Contusion of other part of head, initial encounter: Principal | ICD-10-CM | POA: Insufficient documentation

## 2013-08-27 DIAGNOSIS — G43009 Migraine without aura, not intractable, without status migrainosus: Secondary | ICD-10-CM

## 2013-08-27 DIAGNOSIS — H1132 Conjunctival hemorrhage, left eye: Secondary | ICD-10-CM

## 2013-08-27 DIAGNOSIS — Z8659 Personal history of other mental and behavioral disorders: Secondary | ICD-10-CM | POA: Insufficient documentation

## 2013-08-27 DIAGNOSIS — H1130 Conjunctival hemorrhage, unspecified eye: Secondary | ICD-10-CM | POA: Insufficient documentation

## 2013-08-27 DIAGNOSIS — G43001 Migraine without aura, not intractable, with status migrainosus: Secondary | ICD-10-CM | POA: Insufficient documentation

## 2013-08-27 DIAGNOSIS — F1010 Alcohol abuse, uncomplicated: Secondary | ICD-10-CM | POA: Insufficient documentation

## 2013-08-27 DIAGNOSIS — S0093XA Contusion of unspecified part of head, initial encounter: Secondary | ICD-10-CM

## 2013-08-27 DIAGNOSIS — S0003XA Contusion of scalp, initial encounter: Secondary | ICD-10-CM | POA: Insufficient documentation

## 2013-08-27 DIAGNOSIS — S1093XA Contusion of unspecified part of neck, initial encounter: Principal | ICD-10-CM

## 2013-08-27 MED ORDER — TETRACAINE HCL 0.5 % OP SOLN
2.0000 [drp] | Freq: Once | OPHTHALMIC | Status: AC
  Administered 2013-08-27: 2 [drp] via OPHTHALMIC
  Filled 2013-08-27: qty 2

## 2013-08-27 MED ORDER — KETOROLAC TROMETHAMINE 0.5 % OP SOLN
1.0000 [drp] | Freq: Four times a day (QID) | OPHTHALMIC | Status: DC | PRN
  Administered 2013-08-27: 1 [drp] via OPHTHALMIC
  Filled 2013-08-27: qty 5

## 2013-08-27 MED ORDER — SODIUM CHLORIDE 0.9 % IV SOLN
1000.0000 mL | Freq: Once | INTRAVENOUS | Status: AC
  Administered 2013-08-27: 1000 mL via INTRAVENOUS

## 2013-08-27 MED ORDER — ACETAMINOPHEN 500 MG PO TABS
1000.0000 mg | ORAL_TABLET | Freq: Once | ORAL | Status: AC
  Administered 2013-08-27: 1000 mg via ORAL
  Filled 2013-08-27: qty 2

## 2013-08-27 MED ORDER — DIPHENHYDRAMINE HCL 50 MG/ML IJ SOLN
25.0000 mg | Freq: Once | INTRAMUSCULAR | Status: AC
  Administered 2013-08-27: 25 mg via INTRAVENOUS
  Filled 2013-08-27: qty 1

## 2013-08-27 MED ORDER — TRAMADOL HCL 50 MG PO TABS
100.0000 mg | ORAL_TABLET | Freq: Once | ORAL | Status: AC
  Administered 2013-08-27: 100 mg via ORAL
  Filled 2013-08-27: qty 2

## 2013-08-27 MED ORDER — METOCLOPRAMIDE HCL 5 MG/ML IJ SOLN
10.0000 mg | Freq: Once | INTRAMUSCULAR | Status: AC
  Administered 2013-08-27: 10 mg via INTRAVENOUS
  Filled 2013-08-27: qty 2

## 2013-08-27 MED ORDER — FLUORESCEIN SODIUM 1 MG OP STRP
1.0000 | ORAL_STRIP | Freq: Once | OPHTHALMIC | Status: AC
  Administered 2013-08-27: 1 via OPHTHALMIC
  Filled 2013-08-27: qty 1

## 2013-08-27 MED ORDER — SODIUM CHLORIDE 0.9 % IV SOLN
1000.0000 mL | INTRAVENOUS | Status: DC
  Administered 2013-08-27: 1000 mL via INTRAVENOUS
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
## 2013-08-27 NOTE — Discharge Instructions (Signed)
Ice packs to the bruised areas. Use the eye drops in your left eye every 6 hrs (4 times a day) for pain as needed. Call Dr Iona Hansen office, he can see you tomorrow (the office will be closed on Friday, July 3rd).  Return to the ED for any problems listed on the head injury sheet or if your vision gets worse. Consider going to AA or getting help for your alcohol problem.   Head Injury You have received a head injury. It does not appear serious at this time. Headaches and vomiting are common following head injury. It should be easy to awaken from sleeping. Sometimes it is necessary for you to stay in the emergency department for a while for observation. Sometimes admission to the hospital may be needed. After injuries such as yours, most problems occur within the first 24 hours, but side effects may occur up to 7-10 days after the injury. It is important for you to carefully monitor your condition and contact your health care provider or seek immediate medical care if there is a change in your condition. WHAT ARE THE TYPES OF HEAD INJURIES? Head injuries can be as minor as a bump. Some head injuries can be more severe. More severe head injuries include:  A jarring injury to the brain (concussion).  A bruise of the brain (contusion). This mean there is bleeding in the brain that can cause swelling.  A cracked skull (skull fracture).  Bleeding in the brain that collects, clots, and forms a bump (hematoma). WHAT CAUSES A HEAD INJURY? A serious head injury is most likely to happen to someone who is in a car wreck and is not wearing a seat belt. Other causes of major head injuries include bicycle or motorcycle accidents, sports injuries, and falls. HOW ARE HEAD INJURIES DIAGNOSED? A complete history of the event leading to the injury and your current symptoms will be helpful in diagnosing head injuries. Many times, pictures of the brain, such as CT or MRI are needed to see the extent of the injury. Often, an  overnight hospital stay is necessary for observation.  WHEN SHOULD I SEEK IMMEDIATE MEDICAL CARE?  You should get help right away if:  You have confusion or drowsiness.  You feel sick to your stomach (nauseous) or have continued, forceful vomiting.  You have dizziness or unsteadiness that is getting worse.  You have severe, continued headaches not relieved by medicine. Only take over-the-counter or prescription medicines for pain, fever, or discomfort as directed by your health care provider.  You do not have normal function of the arms or legs or are unable to walk.  You notice changes in the black spots in the center of the colored part of your eye (pupil).  You have a clear or bloody fluid coming from your nose or ears.  You have a loss of vision. During the next 24 hours after the injury, you must stay with someone who can watch you for the warning signs. This person should contact local emergency services (911 in the [place] if you have seizures, you become unconscious, or you are unable to wake up. HOW CAN I PREVENT A HEAD INJURY IN THE FUTURE? The most important factor for preventing major head injuries is avoiding motor vehicle accidents. To minimize the potential for damage to your head, it is crucial to wear seat belts while riding in motor vehicles. Wearing helmets while bike riding and playing collision sports (like football) is also helpful. Also, avoiding dangerous activities  around the house will further help reduce your risk of head injury.  WHEN CAN I RETURN TO NORMAL ACTIVITIES AND ATHLETICS? You should be reevaluated by your health care provider before returning to these activities. If you have any of the following symptoms, you should not return to activities or contact sports until 1 week after the symptoms have stopped:  Persistent headache.  Dizziness or vertigo.  Poor attention and concentration.  Confusion.  Memory problems.  Nausea or vomiting.  Fatigue  or tire easily.  Irritability.  Intolerant of bright lights or loud noises.  Anxiety or depression.  Disturbed sleep. MAKE SURE YOU:   Understand these instructions.  Will watch your condition.  Will get help right away if you are not doing well or get worse. Document Released: 02/13/2005 Document Revised: 02/18/2013 Document Reviewed: 10/21/2012 [facility] Patient Information 2015 [place], [place]. This information is not intended to replace advice given to you by your health care provider. Make sure you discuss any questions you have with your health care provider.  Cryotherapy Cryotherapy means treatment with cold. Ice or gel packs can be used to reduce both pain and swelling. Ice is the most helpful within the first 24 to 48 hours after an injury or flareup from overusing a muscle or joint. Sprains, strains, spasms, burning pain, shooting pain, and aches can all be eased with ice. Ice can also be used when recovering from surgery. Ice is effective, has very few side effects, and is safe for most people to use. PRECAUTIONS  Ice is not a safe treatment option for people with:  Raynaud's phenomenon. This is a condition affecting small blood vessels in the extremities. Exposure to cold may cause your problems to return.  Cold hypersensitivity. There are many forms of cold hypersensitivity, including:  Cold urticaria. Red, itchy hives appear on the skin when the tissues begin to warm after being iced.  Cold erythema. This is a red, itchy rash caused by exposure to cold.  Cold hemoglobinuria. Red blood cells break down when the tissues begin to warm after being iced. The hemoglobin that carry oxygen are passed into the urine because they cannot combine with blood proteins fast enough.  Numbness or altered sensitivity in the area being iced. If you have any of the following conditions, do not use ice until you have discussed cryotherapy with your caregiver:  Heart conditions, such as  arrhythmia, angina, or chronic heart disease.  High blood pressure.  Healing wounds or open skin in the area being iced.  Current infections.  Rheumatoid arthritis.  Poor circulation.  Diabetes. Ice slows the blood flow in the region it is applied. This is beneficial when trying to stop inflamed tissues from spreading irritating chemicals to surrounding tissues. However, if you expose your skin to cold temperatures for too long or without the proper protection, you can damage your skin or nerves. Watch for signs of skin damage due to cold. HOME CARE INSTRUCTIONS Follow these tips to use ice and cold packs safely.  Place a dry or damp towel between the ice and skin. A damp towel will cool the skin more quickly, so you may need to shorten the time that the ice is used.  For a more rapid response, add gentle compression to the ice.  Ice for no more than 10 to 20 minutes at a time. The bonier the area you are icing, the less time it will take to get the benefits of ice.  Check your skin after 5  minutes to make sure there are no signs of a poor response to cold or skin damage.  Rest 20 minutes or more in between uses.  Once your skin is numb, you can end your treatment. You can test numbness by very lightly touching your skin. The touch should be so light that you do not see the skin dimple from the pressure of your fingertip. When using ice, most people will feel these normal sensations in this order: cold, burning, aching, and numbness.  Do not use ice on someone who cannot communicate their responses to pain, such as small children or people with dementia. HOW TO MAKE AN ICE PACK Ice packs are the most common way to use ice therapy. Other methods include ice massage, ice baths, and cryo-sprays. Muscle creams that cause a cold, tingly feeling do not offer the same benefits that ice offers and should not be used as a substitute unless recommended by your caregiver. To make an ice pack, do  one of the following:  Place crushed ice or a bag of frozen vegetables in a sealable plastic bag. Squeeze out the excess air. Place this bag inside another plastic bag. Slide the bag into a pillowcase or place a damp towel between your skin and the bag.  Mix 3 parts water with 1 part rubbing alcohol. Freeze the mixture in a sealable plastic bag. When you remove the mixture from the freezer, it will be slushy. Squeeze out the excess air. Place this bag inside another plastic bag. Slide the bag into a pillowcase or place a damp towel between your skin and the bag. SEEK MEDICAL CARE IF:  You develop white spots on your skin. This may give the skin a blotchy (mottled) appearance.  Your skin turns blue or pale.  Your skin becomes waxy or hard.  Your swelling gets worse. MAKE SURE YOU:   Understand these instructions.  Will watch your condition.  Will get help right away if you are not doing well or get worse. Document Released: 10/10/2010 Document Revised: 05/08/2011 Document Reviewed: 10/10/2010 [facility] Patient Information 2015 [place], [place]. This information is not intended to replace advice given to you by your health care provider. Make sure you discuss any questions you have with your health care provider.  Subconjunctival Hemorrhage Your exam shows you have a subconjunctival hemorrhage. This is a harmless collection of blood covering a portion of the white of the eye. This condition may be due to injury or to straining (lifting, sneezing, or coughing). Often, there is no known cause. Subconjunctival blood does not cause pain or vision problems. This condition needs no treatment. It will take 1 to 2 weeks for the blood to dissolve. If you take aspirin or Coumadin on a daily basis or if you have high blood pressure, you should check with your doctor about the need for further treatment. Please call your doctor if you have problems with your vision, pain around the eye, or any other  concerns about your condition. Document Released: 03/23/2004 Document Revised: 05/08/2011 Document Reviewed: 01/11/2009 [facility] Patient Information 2015 [place], [place]. This information is not intended to replace advice given to you by your health care provider. Make sure you discuss any questions you have with your health care provider.   Emergency Department Resource Guide     Residential Treatment Programs Organization         Address  Phone  Notes  [facility] [address]    [place]  [phone]  [facility]  [address], [place] [id number], [place], [place]   [place], [place] [phone] Admissions: 8am-3pm M-F  [facility] [place] [address]    [place], [place] [phone]   [facility] [address] [place], [place], [place]   [facility] [address]  [place], [place]   [facility] Intensive Outpatient [address], [place], [place]   [facility] [place]  [place], [place] [phone] or [phone]   [facility] [address] [place], [place] Accepts Medicaid  [facility] [address],  [place] [phone] Substance Abuse/Addiction Treatment   [facility]         Address  Phone  Notes  [facility]  [phone]   Domenic Schwab, PhD [address] [place], [place]   [phone] or [phone]   [place], [place] [phone]   [facility] [address], [place], [place] [phone] Insurance/Medicaid/sponsorship through [facility] [address], [place]                                    [place], [place] [phone] [place] [address], [place] [phone]    Dr. Adele Schilder  [phone]   [facility] Dept. 1) [address], [place] 2) [place] 3)  [place] [address], [place] [phone]  [phone]   [place] [phone] or [phone] (After Hours)        Alcohol Use Disorder Alcohol use disorder is a mental disorder. It is not a one-time incident of heavy drinking. Alcohol use disorder is the excessive and uncontrollable use of alcohol over time that leads to problems with functioning in one or more areas of daily living. People with this disorder risk harming themselves and others when they drink to excess. Alcohol use disorder also can cause other mental disorders, such as mood and anxiety disorders, and serious physical problems. People with alcohol use disorder often misuse other drugs.  Alcohol use disorder is common and widespread. Some people with this disorder drink alcohol to cope with or escape from negative life events. Others drink to relieve chronic pain or symptoms of mental illness. People with a family history of alcohol use disorder are at higher risk of losing control and using alcohol to excess.  SYMPTOMS  Signs and symptoms of alcohol use disorder may include the following:   Consumption ofalcohol inlarger amounts or over a longer period of time than intended.  Multiple unsuccessful attempts to cutdown or control alcohol use.   A great deal of time spent obtaining alcohol, using alcohol, or recovering from the effects of alcohol (hangover).  A strong desire or urge to use alcohol (cravings).   Continued use of alcohol despite problems at work, school, or home because of alcohol use.   Continued use of alcohol despite problems in relationships because of alcohol use.  Continued use of alcohol in situations when it is  physically hazardous, such as driving a car.  Continued use of alcohol despite awareness of a physical or psychological problem that is likely related to alcohol use. Physical problems related to alcohol use can involve the brain, heart, liver, stomach, and intestines. Psychological problems related to alcohol use include intoxication, depression, anxiety, psychosis, delirium, and dementia.   The need for increased amounts of alcohol to achieve the same desired effect, or a decreased effect from the consumption of the same amount of alcohol (tolerance).  Withdrawal symptoms upon reducing or stopping alcohol use, or alcohol use to reduce or avoid withdrawal symptoms. Withdrawal symptoms include:  Racing heart.  Hand tremor.  Difficulty sleeping.  Nausea.  Vomiting.  Hallucinations.  Restlessness.  Seizures. DIAGNOSIS Alcohol use disorder is diagnosed through an assessment by your caregiver. Your caregiver may start by asking three or four questions to screen for excessive or problematic alcohol use. To confirm a diagnosis of alcohol use disorder, at least two symptoms (see SYMPTOMS) must be present within a 68-month period. The severity of alcohol use disorder depends on the number of symptoms:  Mild--two or three.  Moderate--four or five.  Severe--six or more. Your caregiver may perform a physical exam or use results from lab tests to see if you have physical problems resulting from alcohol use. Your caregiver may refer you to a mental health professional for evaluation. TREATMENT  Some people with alcohol use disorder are able to reduce their alcohol use to low-risk levels. Some people with alcohol use disorder need to quit drinking alcohol. When necessary, mental health professionals with specialized training in substance use treatment can help. Your caregiver can help you decide how severe your alcohol use disorder is and what type of treatment you need. The following forms of  treatment are available:   Detoxification. Detoxification involves the use of prescription medication to prevent alcohol withdrawal symptoms in the first week after quitting. This is important for people with a history of symptoms of withdrawal and for heavy drinkers who are likely to have withdrawal symptoms. Alcohol withdrawal can be dangerous and, in severe cases, cause death. Detoxification is usually provided in a hospital or in-patient substance use treatment facility.  Counseling or talk therapy. Talk therapy is provided by substance use treatment counselors. It addresses the reasons people use alcohol and ways to keep them from drinking again. The goals of talk therapy are to help people with alcohol use disorder find healthy activities and ways to cope with life stress, to identify and avoid triggers for alcohol use, and to handle cravings, which can cause relapse.  Medication.Different medications can help treat alcohol use disorder through the following actions:  Decrease alcohol cravings.  Decrease the positive reward response felt from alcohol use.  Produce an uncomfortable physical reaction when alcohol is used (aversion therapy).  Support groups. Support groups are run by people who have quit drinking. They provide emotional support, advice, and guidance. These forms of treatment are often combined. Some people with alcohol use disorder benefit from intensive combination treatment provided by specialized substance use treatment centers. Both inpatient and outpatient treatment programs are available. Document Released: 03/23/2004 Document Revised: 10/16/2012 Document Reviewed: 05/23/2012 [facility] Patient Information 2015 [place], [place]. This information is not intended to replace advice given to you by your health care provider. Make sure you discuss any questions you have with your health care provider.
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
## 2013-12-29 ENCOUNTER — Encounter (HOSPITAL_COMMUNITY): Payer: Self-pay | Admitting: Emergency Medicine

## 2014-02-22 ENCOUNTER — Emergency Department (HOSPITAL_COMMUNITY)
Admission: EM | Admit: 2014-02-22 | Discharge: 2014-02-22 | Disposition: A | Discharge status: Home / Self Care | Payer: Self-pay | Attending: Emergency Medicine | Admitting: Emergency Medicine

## 2014-02-22 ENCOUNTER — Encounter (HOSPITAL_COMMUNITY): Payer: Self-pay | Admitting: Emergency Medicine

## 2014-02-22 DIAGNOSIS — M5431 Sciatica, right side: Secondary | ICD-10-CM | POA: Insufficient documentation

## 2014-02-22 DIAGNOSIS — I10 Essential (primary) hypertension: Secondary | ICD-10-CM | POA: Insufficient documentation

## 2014-02-22 DIAGNOSIS — G43909 Migraine, unspecified, not intractable, without status migrainosus: Secondary | ICD-10-CM | POA: Insufficient documentation

## 2014-02-22 DIAGNOSIS — J069 Acute upper respiratory infection, unspecified: Secondary | ICD-10-CM | POA: Insufficient documentation

## 2014-02-22 DIAGNOSIS — E669 Obesity, unspecified: Secondary | ICD-10-CM | POA: Insufficient documentation

## 2014-02-22 DIAGNOSIS — G40909 Epilepsy, unspecified, not intractable, without status epilepticus: Secondary | ICD-10-CM | POA: Insufficient documentation

## 2014-02-22 DIAGNOSIS — Z87891 Personal history of nicotine dependence: Secondary | ICD-10-CM | POA: Insufficient documentation

## 2014-02-22 DIAGNOSIS — Z79899 Other long term (current) drug therapy: Secondary | ICD-10-CM | POA: Insufficient documentation

## 2014-02-22 MED ORDER — CYCLOBENZAPRINE HCL 10 MG PO TABS: 10.0000 mg | ORAL_TABLET | Freq: Two times a day (BID) | ORAL | Status: DC | PRN

## 2014-02-22 MED ORDER — HYDROCODONE-ACETAMINOPHEN 5-325 MG PO TABS: 1.0000 | ORAL_TABLET | ORAL | Status: DC | PRN

## 2014-02-22 MED ORDER — BENZONATATE 100 MG PO CAPS: 100.0000 mg | ORAL_CAPSULE | Freq: Three times a day (TID) | ORAL | Status: DC

## 2014-02-22 MED ORDER — LORATADINE 10 MG PO TABS: 10.0000 mg | ORAL_TABLET | Freq: Every day | ORAL | Status: DC

## 2014-02-22 NOTE — ED Provider Notes (Signed)
CSN: [id number]     Arrival date & time 02/22/14  0730 History   First MD Initiated Contact with Patient 02/22/14 [phone]     Chief Complaint  Patient presents with  . Nasal Congestion  . Back Pain     (Consider location/radiation/quality/duration/timing/severity/associated sxs/prior Treatment) Patient is a 43 y.o. female presenting with back pain and URI. The history is provided by the patient.  Back Pain Location:  Lumbar spine Quality:  Aching and burning Radiates to:  R posterior upper leg Pain severity:  Moderate Onset quality:  Gradual Duration:  1 week Timing:  Constant Progression:  Worsening Chronicity:  New Relieved by:  Nothing Worsened by:  Ambulation, movement and bending Ineffective treatments:  Ibuprofen and OTC medications Associated symptoms: no bladder incontinence and no bowel incontinence   URI Presenting symptoms: congestion, cough and sore throat   Severity:  Moderate Onset quality:  Gradual Duration:  1 week Timing:  Constant Progression:  Worsening Chronicity:  New Relieved by:  Nothing Worsened by:  Nothing tried Ineffective treatments:  Decongestant and OTC medications Associated symptoms: myalgias, sinus pain and wheezing    Whitney Bush is a 43 y.o. female who presents to the ED with congestion, sore throat, productive cough with yellow sputum that started one week ago. She also complains of low back pain but denies UTI symptoms.   Past Medical History  Diagnosis Date  . Hypertension   . Depression   . Migraines   . Peripheral edema   . Alcohol abuse    Past Surgical History  Procedure Laterality Date  . Abcess drainage     Family History  Problem Relation Age of Onset  . Diabetes Father   . Cancer Other    History  Substance Use Topics  . Smoking status: Former Smoker -- 1.00 packs/day for 5 years    Types: Cigarettes    Quit date: 12/29/2010  . Smokeless tobacco: Current User    Types: Chew  . Alcohol Use: Yes      Comment: occ   OB History    Gravida Para Term Preterm AB TAB SAB Ectopic Multiple Living   2 1 1  1     1      Review of Systems  HENT: Positive for congestion and sore throat.   Respiratory: Positive for cough and wheezing.   Gastrointestinal: Negative for bowel incontinence.  Genitourinary: Negative for bladder incontinence.  Musculoskeletal: Positive for myalgias and back pain.  all other systems negative    Allergies  Review of patient's allergies indicates no known allergies.  Home Medications   Prior to Admission medications   Medication Sig Start Date End Date Taking? Authorizing Provider  acetaminophen-codeine (TYLENOL #3) 300-30 MG per tablet Take 1-2 tablets by mouth every 6 (six) hours as needed for moderate pain. 07/29/13   Glendell Docker, NP  benzonatate (TESSALON) 100 MG capsule Take 1 capsule (100 mg total) by mouth every 8 (eight) hours. 02/22/14   Marilynne Dupuis Bunnie Pion, NP  cyclobenzaprine (FLEXERIL) 10 MG tablet Take 1 tablet (10 mg total) by mouth 2 (two) times daily as needed for muscle spasms. 02/22/14   Sary Bogie Bunnie Pion, NP  HYDROcodone-acetaminophen (NORCO/VICODIN) 5-325 MG per tablet Take 1 tablet by mouth every 4 (four) hours as needed. 02/22/14   Lakela Kuba Bunnie Pion, NP  ibuprofen (ADVIL,MOTRIN) 200 MG tablet Take 400 mg by mouth every 6 (six) hours as needed. For pain    Historical Provider, MD  lisinopril-hydrochlorothiazide (PRINZIDE,ZESTORETIC) 20-25 MG per  tablet Take 1 tablet by mouth daily.    Historical Provider, MD  loratadine (CLARITIN) 10 MG tablet Take 1 tablet (10 mg total) by mouth daily. 02/22/14   Rahmel Nedved Bunnie Pion, NP   BP 147/87 mmHg  Pulse 109  Temp(Src) 98.9 F (37.2 C) (Oral)  Resp 18  Ht 5\' 2"  (1.575 m)  Wt 241 lb (109.317 kg)  BMI 44.07 kg/m2  SpO2 100%  LMP 02/08/2014 Physical Exam  Constitutional: She is oriented to person, place, and time. No distress.  Obese   HENT:  Head: Normocephalic and atraumatic.  Right Ear: Tympanic membrane normal.   Left Ear: Tympanic membrane normal.  Nose: Nose normal.  Mouth/Throat: Uvula is midline, oropharynx is clear and moist and mucous membranes are normal.  Eyes: Conjunctivae and EOM are normal.  Neck: Normal range of motion. Neck supple.  Cardiovascular: Normal rate and regular rhythm.   Pulmonary/Chest: Effort normal. She has no wheezes. She has no rales.  Abdominal: Soft. Bowel sounds are normal. There is no tenderness.  Musculoskeletal: Normal range of motion.       Lumbar back: She exhibits tenderness, pain and spasm. She exhibits normal pulse.  Neurological: She is alert and oriented to person, place, and time. She has normal strength. No cranial nerve deficit or sensory deficit. Gait normal.  Reflex Scores:      Bicep reflexes are 2+ on the right side and 2+ on the left side.      Brachioradialis reflexes are 2+ on the right side and 2+ on the left side.      Patellar reflexes are 2+ on the right side and 2+ on the left side.      Achilles reflexes are 2+ on the right side and 2+ on the left side. Skin: Skin is warm and dry.  Psychiatric: She has a normal mood and affect. Her behavior is normal.  Nursing note and vitals reviewed.   ED Course  Procedures   MDM  43 y.o. female with cough cold and congestion x 1 week, has recently stopped smoking. Stable for discharge with no respiratory distress and O2 SAT 100% on R/A. Will treat for bronchitis and low back pain. No neuro deficits. She will follow up with her PCP or return here for worsening symptoms. Discussed with the patient and all questioned fully answered.     Medication List    TAKE these medications        benzonatate 100 MG capsule  Commonly known as:  TESSALON  Take 1 capsule (100 mg total) by mouth every 8 (eight) hours.     cyclobenzaprine 10 MG tablet  Commonly known as:  FLEXERIL  Take 1 tablet (10 mg total) by mouth 2 (two) times daily as needed for muscle spasms.     HYDROcodone-acetaminophen 5-325 MG per  tablet  Commonly known as:  NORCO/VICODIN  Take 1 tablet by mouth every 4 (four) hours as needed.     loratadine 10 MG tablet  Commonly known as:  CLARITIN  Take 1 tablet (10 mg total) by mouth daily.      ASK your doctor about these medications        acetaminophen-codeine 300-30 MG per tablet  Commonly known as:  TYLENOL #3  Take 1-2 tablets by mouth every 6 (six) hours as needed for moderate pain.     ibuprofen 200 MG tablet  Commonly known as:  ADVIL,MOTRIN  Take 400 mg by mouth every 6 (six) hours as needed. For pain  lisinopril-hydrochlorothiazide 20-25 MG per tablet  Commonly known as:  PRINZIDE,ZESTORETIC  Take 1 tablet by mouth daily.         Final diagnoses:  Sciatica, right  URI, acute     Ashley Murrain, NP 02/22/14 [place], MD 02/22/14 [phone]

## 2014-02-22 NOTE — ED Notes (Signed)
PT c/o nasal congestion/sore throat and productive cough with reported yellow phlegm x1 week. PT also c/o lower back pain and lower leg pain x1 week. PT reports increased urinary frequency but denies burring or pain.

## 2014-02-22 NOTE — Discharge Instructions (Signed)
Do not take the narcotic or the muscle relaxant if you are driving because they will make you sleepy.

## 2014-04-17 ENCOUNTER — Emergency Department (HOSPITAL_COMMUNITY): Payer: Self-pay

## 2014-04-17 ENCOUNTER — Encounter (HOSPITAL_COMMUNITY): Payer: Self-pay | Admitting: Emergency Medicine

## 2014-04-17 ENCOUNTER — Emergency Department (HOSPITAL_COMMUNITY)
Admission: EM | Admit: 2014-04-17 | Discharge: 2014-04-17 | Disposition: A | Discharge status: Home / Self Care | Payer: Self-pay | Attending: Emergency Medicine | Admitting: Emergency Medicine

## 2014-04-17 DIAGNOSIS — F419 Anxiety disorder, unspecified: Secondary | ICD-10-CM | POA: Insufficient documentation

## 2014-04-17 DIAGNOSIS — L0291 Cutaneous abscess, unspecified: Secondary | ICD-10-CM

## 2014-04-17 DIAGNOSIS — G43909 Migraine, unspecified, not intractable, without status migrainosus: Secondary | ICD-10-CM | POA: Insufficient documentation

## 2014-04-17 DIAGNOSIS — R630 Anorexia: Secondary | ICD-10-CM | POA: Insufficient documentation

## 2014-04-17 DIAGNOSIS — L02213 Cutaneous abscess of chest wall: Secondary | ICD-10-CM | POA: Insufficient documentation

## 2014-04-17 DIAGNOSIS — F4321 Adjustment disorder with depressed mood: Secondary | ICD-10-CM

## 2014-04-17 DIAGNOSIS — Z87891 Personal history of nicotine dependence: Secondary | ICD-10-CM | POA: Insufficient documentation

## 2014-04-17 DIAGNOSIS — Z3202 Encounter for pregnancy test, result negative: Secondary | ICD-10-CM | POA: Insufficient documentation

## 2014-04-17 DIAGNOSIS — Z79899 Other long term (current) drug therapy: Secondary | ICD-10-CM | POA: Insufficient documentation

## 2014-04-17 DIAGNOSIS — R05 Cough: Secondary | ICD-10-CM | POA: Insufficient documentation

## 2014-04-17 DIAGNOSIS — I10 Essential (primary) hypertension: Secondary | ICD-10-CM | POA: Insufficient documentation

## 2014-04-17 DIAGNOSIS — R002 Palpitations: Secondary | ICD-10-CM | POA: Insufficient documentation

## 2014-04-17 DIAGNOSIS — F329 Major depressive disorder, single episode, unspecified: Secondary | ICD-10-CM | POA: Insufficient documentation

## 2014-04-17 DIAGNOSIS — F4324 Adjustment disorder with disturbance of conduct: Secondary | ICD-10-CM | POA: Insufficient documentation

## 2014-04-17 LAB — RAPID URINE DRUG SCREEN, HOSP PERFORMED
Amphetamines: NOT DETECTED
BENZODIAZEPINES: NOT DETECTED
Barbiturates: NOT DETECTED
Cocaine: NOT DETECTED
OPIATES: NOT DETECTED
Tetrahydrocannabinol: NOT DETECTED

## 2014-04-17 LAB — CBC WITH DIFFERENTIAL/PLATELET
BASOS PCT: 0 % (ref 0–1)
Basophils Absolute: 0 10*3/uL (ref 0.0–0.1)
Eosinophils Absolute: 0.1 10*3/uL (ref 0.0–0.7)
Eosinophils Relative: 1 % (ref 0–5)
HEMATOCRIT: 34.1 % — AB (ref 36.0–46.0)
Hemoglobin: 10.6 g/dL — ABNORMAL LOW (ref 12.0–15.0)
Lymphocytes Relative: 27 % (ref 12–46)
Lymphs Abs: 2.9 10*3/uL (ref 0.7–4.0)
MCH: 25.7 pg — ABNORMAL LOW (ref 26.0–34.0)
MCHC: 31.1 g/dL (ref 30.0–36.0)
MCV: 82.8 fL (ref 78.0–100.0)
Monocytes Absolute: 0.6 10*3/uL (ref 0.1–1.0)
Monocytes Relative: 5 % (ref 3–12)
NEUTROS ABS: 7.4 10*3/uL (ref 1.7–7.7)
NEUTROS PCT: 67 % (ref 43–77)
PLATELETS: 252 10*3/uL (ref 150–400)
RBC: 4.12 MIL/uL (ref 3.87–5.11)
RDW: 18.2 % — ABNORMAL HIGH (ref 11.5–15.5)
WBC: 11 10*3/uL — AB (ref 4.0–10.5)

## 2014-04-17 LAB — URINALYSIS, ROUTINE W REFLEX MICROSCOPIC
Bilirubin Urine: NEGATIVE
Glucose, UA: NEGATIVE mg/dL
HGB URINE DIPSTICK: NEGATIVE
Ketones, ur: NEGATIVE mg/dL
Leukocytes, UA: NEGATIVE
Nitrite: NEGATIVE
Protein, ur: NEGATIVE mg/dL
SPECIFIC GRAVITY, URINE: 1.02 (ref 1.005–1.030)
Urobilinogen, UA: 0.2 mg/dL (ref 0.0–1.0)
pH: 6.5 (ref 5.0–8.0)

## 2014-04-17 LAB — PREGNANCY, URINE: Preg Test, Ur: NEGATIVE

## 2014-04-17 LAB — COMPREHENSIVE METABOLIC PANEL
ALT: 52 U/L — AB (ref 0–35)
AST: 111 U/L — AB (ref 0–37)
Albumin: 3.3 g/dL — ABNORMAL LOW (ref 3.5–5.2)
Alkaline Phosphatase: 100 U/L (ref 39–117)
Anion gap: 9 (ref 5–15)
BUN: 5 mg/dL — ABNORMAL LOW (ref 6–23)
CO2: 26 mmol/L (ref 19–32)
Calcium: 8.7 mg/dL (ref 8.4–10.5)
Chloride: 100 mmol/L (ref 96–112)
Creatinine, Ser: 0.59 mg/dL (ref 0.50–1.10)
GFR calc Af Amer: >90 mL/min (ref >90)
GLUCOSE: 118 mg/dL — AB (ref 70–99)
POTASSIUM: 3.7 mmol/L (ref 3.5–5.1)
SODIUM: 135 mmol/L (ref 135–145)
Total Bilirubin: 0.9 mg/dL (ref 0.3–1.2)
Total Protein: 8.4 g/dL — ABNORMAL HIGH (ref 6.0–8.3)

## 2014-04-17 LAB — POC URINE PREG, ED: PREG TEST UR: NEGATIVE

## 2014-04-17 LAB — TROPONIN I: Troponin I: <0.03 ng/mL (ref <0.031)

## 2014-04-17 LAB — LIPASE, BLOOD: Lipase: 28 U/L (ref 11–59)

## 2014-04-17 LAB — ETHANOL: Alcohol, Ethyl (B): 13 mg/dL — ABNORMAL HIGH (ref 0–9)

## 2014-04-17 MED ORDER — PROMETHAZINE HCL 25 MG/ML IJ SOLN: 12.5000 mg | Freq: Once | INTRAMUSCULAR | Status: DC

## 2014-04-17 MED ORDER — LORAZEPAM 1 MG PO TABS: 1.0000 mg | ORAL_TABLET | Freq: Three times a day (TID) | ORAL | Status: DC | PRN

## 2014-04-17 MED ORDER — ONDANSETRON HCL 4 MG/2ML IJ SOLN: 4.0000 mg | Freq: Once | INTRAMUSCULAR | Status: DC

## 2014-04-17 MED ORDER — ONDANSETRON 8 MG PO TBDP
8.0000 mg | ORAL_TABLET | Freq: Once | ORAL | Status: AC
  Administered 2014-04-17: 8 mg via ORAL
  Filled 2014-04-17: qty 1

## 2014-04-17 MED ORDER — LIDOCAINE HCL (PF) 1 % IJ SOLN
5.0000 mL | Freq: Once | INTRAMUSCULAR | Status: AC
  Administered 2014-04-17: 5 mL
  Filled 2014-04-17: qty 5

## 2014-04-17 MED ORDER — LISINOPRIL-HYDROCHLOROTHIAZIDE 20-25 MG PO TABS: 1.0000 | ORAL_TABLET | Freq: Every day | ORAL | Status: DC

## 2014-04-17 MED ORDER — SULFAMETHOXAZOLE-TRIMETHOPRIM 800-160 MG PO TABS: 1.0000 | ORAL_TABLET | Freq: Two times a day (BID) | ORAL | Status: DC

## 2014-04-17 NOTE — Discharge Instructions (Signed)
Abscess Care After An abscess (also called a boil or furuncle) is an infected area that contains a collection of pus. Signs and symptoms of an abscess include pain, tenderness, redness, or hardness, or you may feel a moveable soft area under your skin. An abscess can occur anywhere in the body. The infection may spread to surrounding tissues causing cellulitis. A cut (incision) by the surgeon was made over your abscess and the pus was drained out. Gauze may have been packed into the space to provide a drain that will allow the cavity to heal from the inside outwards. The boil may be painful for 5 to 7 days. Most people with a boil do not have high fevers. Your abscess, if seen early, may not have localized, and may not have been lanced. If not, another appointment may be required for this if it does not get better on its own or with medications. HOME CARE INSTRUCTIONS   Only take over-the-counter or prescription medicines for pain, discomfort, or fever as directed by your caregiver.  When you bathe, soak and then remove gauze or iodoform packs at least daily or as directed by your caregiver. You may then wash the wound gently with mild soapy water. Repack with gauze or do as your caregiver directs. SEEK IMMEDIATE MEDICAL CARE IF:   You develop increased pain, swelling, redness, drainage, or bleeding in the wound site.  You develop signs of generalized infection including muscle aches, chills, fever, or a general ill feeling.  An oral temperature above 102 F (38.9 C) develops, not controlled by medication. See your caregiver for a recheck if you develop any of the symptoms described above. If medications (antibiotics) were prescribed, take them as directed. Document Released: 09/01/2004 Document Revised: 05/08/2011 Document Reviewed: 04/29/2007 [facility] Patient Information 2015 [place], [place]. This information is not intended to replace advice given to you by your health care provider. Make sure  you discuss any questions you have with your health care provider.  Incision and Drainage Incision and drainage is a procedure in which a sac-like structure (cystic structure) is opened and drained. The area to be drained usually contains material such as pus, fluid, or blood.  LET YOUR CAREGIVER KNOW ABOUT:   Allergies to medicine.  Medicines taken, including vitamins, herbs, eyedrops, over-the-counter medicines, and creams.  Use of steroids (by mouth or creams).  Previous problems with anesthetics or numbing medicines.  History of bleeding problems or blood clots.  Previous surgery.  Other health problems, including diabetes and kidney problems.  Possibility of pregnancy, if this applies. RISKS AND COMPLICATIONS  Pain.  Bleeding.  Scarring.  Infection. BEFORE THE PROCEDURE  You may need to have an ultrasound or other imaging tests to see how large or deep your cystic structure is. Blood tests may also be used to determine if you have an infection or how severe the infection is. You may need to have a tetanus shot. PROCEDURE  The affected area is cleaned with a cleaning fluid. The cyst area will then be numbed with a medicine (local anesthetic). A small incision will be made in the cystic structure. A syringe or catheter may be used to drain the contents of the cystic structure, or the contents may be squeezed out. The area will then be flushed with a cleansing solution. After cleansing the area, it is often gently packed with a gauze or another wound dressing. Once it is packed, it will be covered with gauze and tape or some other type of  wound dressing. AFTER THE PROCEDURE   Often, you will be allowed to go home right after the procedure.  You may be given antibiotic medicine to prevent or heal an infection.  If the area was packed with gauze or some other wound dressing, you will likely need to come back in 1 to 2 days to get it removed.  The area should heal in about  14 days. Document Released: 08/09/2000 Document Revised: 08/15/2011 Document Reviewed: 04/10/2011 [facility] Patient Information 2015 [place], [place]. This information is not intended to replace advice given to you by your health care provider. Make sure you discuss any questions you have with your health care provider.  Panic Attacks Panic attacks are sudden, short feelings of great fear or discomfort. You may have them for no reason when you are relaxed, when you are uneasy (anxious), or when you are sleeping.  HOME CARE  Take all your medicines as told.  Check with your doctor before starting new medicines.  Keep all doctor visits. GET HELP IF:  You are not able to take your medicines as told.  Your symptoms do not get better.  Your symptoms get worse. GET HELP RIGHT AWAY IF:  Your attacks seem different than your normal attacks.  You have thoughts about hurting yourself or others.  You take panic attack medicine and you have a side effect. MAKE SURE YOU:  Understand these instructions.  Will watch your condition.  Will get help right away if you are not doing well or get worse. Document Released: 03/18/2010 Document Revised: 12/04/2012 Document Reviewed: 09/27/2012 [facility] Patient Information 2015 [place], [place]. This information is not intended to replace advice given to you by your health care provider. Make sure you discuss any questions you have with your health care provider.

## 2014-04-17 NOTE — ED Notes (Signed)
Pt c/o palpitations that started 3 days ago. Pt states she feels weak and dizzy. Pt also reports boils on both breasts that started a month ago.

## 2014-04-17 NOTE — ED Provider Notes (Signed)
CSN: [id number]     Arrival date & time 04/17/14  0847 History   First MD Initiated Contact with Patient 04/17/14 [phone]     Chief Complaint  Patient presents with  . Palpitations  . Abscess     (Consider location/radiation/quality/duration/timing/severity/associated sxs/prior Treatment) The history is provided by the patient.   Whitney Bush is a 44 y.o. female with a past medical history of HTN, depression and alcohol abuse whose last intake was yesterday, presenting with a 3 day history of palpitations.  She describes a fast heart rate, which worsens at times, when she first woke this morning it was "really fast", but was unable to measure the rate.  She endorses feeling weak and dizzy during these episodes.  She has had increased fatigue and has been sleeping a lot additionally for the past several days.  She denies fevers or chills, that recently had an upper respiratory infection which is improving, that still has persistent cough which is productive of a white to yellow sputum.  She denies chest pain and shortness of breath.  She does have a headache which started this morning.  Additionally with complaints of bilateral breast abscesses which have been present for the past month.  She has a prior history of abscesses, unclear if she is MRSA positive.  She has found no alleviators for her symptoms.  She has had decreased appetite as well.  She denies abdominal pain but does endorse nausea.  She describes increased depression and is grieving along with her 49 year old daughter in the home over the death of her husband in Mar 15, 2023 suddenly from MI.  She denies suicidal or homicidal ideation.  She states had stopped drinking several months before his death, no dt's or withdrawal symptoms.  She is currently drinking qod on average, states drank "alot" yesterday, unable to quantify.  She is not desirous of detox, would rather stop on her own when ready.  She has not undergone grief  counseling.     Past Medical History  Diagnosis Date  . Hypertension   . Depression   . Migraines   . Peripheral edema   . Alcohol abuse    Past Surgical History  Procedure Laterality Date  . Abcess drainage     Family History  Problem Relation Age of Onset  . Diabetes Father   . Cancer Other    History  Substance Use Topics  . Smoking status: Former Smoker -- 1.00 packs/day for 5 years    Types: Cigarettes    Quit date: 12/29/2010  . Smokeless tobacco: Current User    Types: Snuff  . Alcohol Use: 2.4 oz/week    4 Glasses of wine per week     Comment: occ   OB History    Gravida Para Term Preterm AB TAB SAB Ectopic Multiple Living   2 1 1  1     1      Review of Systems  Constitutional: Positive for appetite change and fatigue. Negative for fever.  HENT: Negative for congestion and sore throat.   Eyes: Negative.   Respiratory: Positive for cough. Negative for chest tightness, shortness of breath and wheezing.   Cardiovascular: Positive for palpitations. Negative for chest pain.  Gastrointestinal: Negative for nausea and abdominal pain.  Genitourinary: Negative.   Musculoskeletal: Negative for joint swelling, arthralgias and neck pain.  Skin: Negative for rash and wound.       Negative except as mentioned in HPI.    Neurological: Negative for  dizziness, weakness, light-headedness, numbness and headaches.  Psychiatric/Behavioral: Negative.        Allergies  Review of patient's allergies indicates no known allergies.  Home Medications   Prior to Admission medications   Medication Sig Start Date End Date Taking? Authorizing Provider  aspirin-sod bicarb-citric acid (ALKA-SELTZER) 325 MG TBEF tablet Take 650 mg by mouth every 6 (six) hours as needed (indigestion).   Yes Historical Provider, MD  ibuprofen (ADVIL,MOTRIN) 200 MG tablet Take 400 mg by mouth every 6 (six) hours as needed. For pain   Yes Historical Provider, MD  acetaminophen-codeine (TYLENOL #3)  300-30 MG per tablet Take 1-2 tablets by mouth every 6 (six) hours as needed for moderate pain. Patient not taking: Reported on 04/17/2014 07/29/13   Glendell Docker, NP  benzonatate (TESSALON) 100 MG capsule Take 1 capsule (100 mg total) by mouth every 8 (eight) hours. Patient not taking: Reported on 04/17/2014 02/22/14   Ashley Murrain, NP  cyclobenzaprine (FLEXERIL) 10 MG tablet Take 1 tablet (10 mg total) by mouth 2 (two) times daily as needed for muscle spasms. Patient not taking: Reported on 04/17/2014 02/22/14   Ashley Murrain, NP  HYDROcodone-acetaminophen (NORCO/VICODIN) 5-325 MG per tablet Take 1 tablet by mouth every 4 (four) hours as needed. Patient not taking: Reported on 04/17/2014 02/22/14   Ashley Murrain, NP  lisinopril-hydrochlorothiazide (PRINZIDE,ZESTORETIC) 20-25 MG per tablet Take 1 tablet by mouth daily. 04/17/14   Evalee Jefferson, PA-C  loratadine (CLARITIN) 10 MG tablet Take 1 tablet (10 mg total) by mouth daily. Patient not taking: Reported on 04/17/2014 02/22/14   Ashley Murrain, NP  LORazepam (ATIVAN) 1 MG tablet Take 1 tablet (1 mg total) by mouth 3 (three) times daily as needed for anxiety. 04/17/14   Evalee Jefferson, PA-C  sulfamethoxazole-trimethoprim (SEPTRA DS) 800-160 MG per tablet Take 1 tablet by mouth every 12 (twelve) hours. 04/17/14   Evalee Jefferson, PA-C   BP 156/117 mmHg  Pulse 103  Temp(Src) 98 F (36.7 C) (Oral)  Resp 18  Ht 5\' 3"  (1.6 m)  Wt 253 lb 6.4 oz (114.941 kg)  BMI 44.90 kg/m2  SpO2 100%  LMP 03/17/2014 Physical Exam  Constitutional: She appears well-developed and well-nourished.  tearful  HENT:  Head: Normocephalic and atraumatic.  Eyes: Conjunctivae and EOM are normal. Pupils are equal, round, and reactive to light.  Neck: Normal range of motion.  Cardiovascular: Regular rhythm, normal heart sounds and intact distal pulses.   Borderline tachy.  Pulmonary/Chest: Effort normal and breath sounds normal. No respiratory distress. She has no wheezes. She has no  rales.  Abdominal: Soft. Bowel sounds are normal. She exhibits no distension. There is no tenderness. There is no rebound and no guarding.  Musculoskeletal: Normal range of motion.  Neurological: She is alert.  Skin: Skin is warm and dry.  There is an open  draining abscess right chest wall beneath breast.  There is no surrounding induration or fluctuance.  There is another non-draining abscess left medial breast with induration and tenderness.  Mild surrounding erythema.  No red streaking.  Psychiatric: She has a normal mood and affect.  Nursing note and vitals reviewed.   ED Course  Procedures (including critical care time)  INCISION AND DRAINAGE Performed by: Evalee Jefferson Consent: Verbal consent obtained. Risks and benefits: risks, benefits and alternatives were discussed Type: abscess  Body area: left inferior breast  Anesthesia: local infiltration  Incision was made with a scalpel.  Local anesthetic: lidocaine 1% without epinephrine  Anesthetic total: 3 ml  Complexity: complex Blunt dissection to break up loculations  Drainage: purulent  Drainage amount: moderate  Packing material: none  Patient tolerance: Patient tolerated the procedure well with no immediate complications.    Labs Review Labs Reviewed  ETHANOL - Abnormal; Notable for the following:    Alcohol, Ethyl (B) 13 (*)    All other components within normal limits  COMPREHENSIVE METABOLIC PANEL - Abnormal; Notable for the following:    Glucose, Bld 118 (*)    BUN 5 (*)    Total Protein 8.4 (*)    Albumin 3.3 (*)    AST 111 (*)    ALT 52 (*)    All other components within normal limits  CBC WITH DIFFERENTIAL/PLATELET - Abnormal; Notable for the following:    WBC 11.0 (*)    Hemoglobin 10.6 (*)    HCT 34.1 (*)    MCH 25.7 (*)    RDW 18.2 (*)    All other components within normal limits  CULTURE, ROUTINE-ABSCESS  URINE RAPID DRUG SCREEN (HOSP PERFORMED)  LIPASE, BLOOD  TROPONIN I   URINALYSIS, ROUTINE W REFLEX MICROSCOPIC  PREGNANCY, URINE  POC URINE PREG, ED    Imaging Review Dg Chest 2 View  04/17/2014   CLINICAL DATA:  Productive cough with congestion for 2 weeks. History of asthma and hypertension. Initial encounter.  EXAM: CHEST  2 VIEW  COMPARISON:  12/27/2011.  FINDINGS: The heart size and mediastinal contours are within normal limits. Both lungs are clear. The visualized skeletal structures are unremarkable. Similar appearance to priors.  IMPRESSION: No active cardiopulmonary disease.   Electronically Signed   By: Rolla Flatten M.D.   On: 04/17/2014 10:23     EKG Interpretation   Date/Time:  Friday April 17 2014 09:11:09 EST Ventricular Rate:  103 PR Interval:  124 QRS Duration: 69 QT Interval:  341 QTC Calculation: 446 R Axis:   19 Text Interpretation:  Sinus tachycardia Low voltage, precordial leads  Borderline T wave abnormalities Baseline wander in lead(s) II III aVF V4  V6 Sinus tachycardia Artifact T wave abnormality Abnormal ekg Confirmed by  Carmin Muskrat  MD [phone]) on 04/17/2014 9:14:18 AM      MDM   Final diagnoses:  Palpitation  Anxiety  Grief reaction  Abscess    Patients labs and/or radiological studies were viewed and considered during the medical decision making and disposition process. Patient was prescribed Bactrim and encouraged to continue warm compresses to her abscesses.  She is prescribed  a small quantity of Ativan as I suspect her palpitations are related to anxiety and grief.  She was given referrals for grief counseling and was strongly encouraged to seek this treatment.  Prior to discharge she also inquired about a refill of her lisinopril/HCTZ that she only has 1 tablet left.  She was given 30 day refill.  Referral also given for establishment of PCP.  In the interim advised to return here for any worsened symptoms regarding her anxiety or depression and/or any worsening abscess symptoms.  Patient is not desirous  of detox at this time.  She denies suicidal or homicidal ideation.  She is felt stable for discharge home.  Her breast abscess pain was completely resolved once I&D was completed.    Evalee Jefferson, PA-C 04/17/14 [place], MD 04/18/14 [phone]

## 2014-04-20 LAB — CULTURE, ROUTINE-ABSCESS: SPECIAL REQUESTS: NORMAL

## 2014-04-21 ENCOUNTER — Telehealth (HOSPITAL_BASED_OUTPATIENT_CLINIC_OR_DEPARTMENT_OTHER): Payer: Self-pay | Admitting: Emergency Medicine

## 2014-04-21 NOTE — Telephone Encounter (Signed)
Post ED Visit - Positive Culture Follow-up  Culture report reviewed by antimicrobial stewardship pharmacist: []  Wes Dulaney, Pharm.D., BCPS [x]  Heide Guile, Pharm.D., BCPS []  Alycia Rossetti, Pharm.D., BCPS []  P[place], Pharm.D., BCPS, AAHIVP []  Legrand Como, Pharm.D., BCPS, AAHIVP []  Isac Sarna, Pharm.D., BCPS  Positive abcess culture Proteus  Treated with sulfamethoxazole-trimethoprim, organism sensitive to the same and no further patient follow-up is required at this time.  Hazle Nordmann 04/21/2014, 9:02 AM

## 2014-07-08 ENCOUNTER — Emergency Department (HOSPITAL_COMMUNITY): Payer: Self-pay

## 2014-07-08 ENCOUNTER — Emergency Department (HOSPITAL_COMMUNITY)
Admission: EM | Admit: 2014-07-08 | Discharge: 2014-07-08 | Disposition: A | Discharge status: Home / Self Care | Payer: Self-pay | Attending: Emergency Medicine | Admitting: Emergency Medicine

## 2014-07-08 ENCOUNTER — Encounter (HOSPITAL_COMMUNITY): Payer: Self-pay | Admitting: Emergency Medicine

## 2014-07-08 DIAGNOSIS — J069 Acute upper respiratory infection, unspecified: Secondary | ICD-10-CM | POA: Insufficient documentation

## 2014-07-08 DIAGNOSIS — Z87891 Personal history of nicotine dependence: Secondary | ICD-10-CM | POA: Insufficient documentation

## 2014-07-08 DIAGNOSIS — F329 Major depressive disorder, single episode, unspecified: Secondary | ICD-10-CM | POA: Insufficient documentation

## 2014-07-08 DIAGNOSIS — G43909 Migraine, unspecified, not intractable, without status migrainosus: Secondary | ICD-10-CM | POA: Insufficient documentation

## 2014-07-08 DIAGNOSIS — Z79899 Other long term (current) drug therapy: Secondary | ICD-10-CM | POA: Insufficient documentation

## 2014-07-08 DIAGNOSIS — I10 Essential (primary) hypertension: Secondary | ICD-10-CM | POA: Insufficient documentation

## 2014-07-08 LAB — CBC WITH DIFFERENTIAL/PLATELET
Basophils Absolute: 0 10*3/uL (ref 0.0–0.1)
Basophils Relative: 0 % (ref 0–1)
Eosinophils Absolute: 0 10*3/uL (ref 0.0–0.7)
Eosinophils Relative: 0 % (ref 0–5)
HEMATOCRIT: 31.7 % — AB (ref 36.0–46.0)
HEMOGLOBIN: 9.9 g/dL — AB (ref 12.0–15.0)
LYMPHS PCT: 22 % (ref 12–46)
Lymphs Abs: 2.2 10*3/uL (ref 0.7–4.0)
MCH: 26.5 pg (ref 26.0–34.0)
MCHC: 31.2 g/dL (ref 30.0–36.0)
MCV: 85 fL (ref 78.0–100.0)
MONOS PCT: 6 % (ref 3–12)
Monocytes Absolute: 0.6 10*3/uL (ref 0.1–1.0)
NEUTROS ABS: 7.3 10*3/uL (ref 1.7–7.7)
NEUTROS PCT: 72 % (ref 43–77)
Platelets: 180 10*3/uL (ref 150–400)
RBC: 3.73 MIL/uL — AB (ref 3.87–5.11)
RDW: 17.4 % — ABNORMAL HIGH (ref 11.5–15.5)
WBC: 10.1 10*3/uL (ref 4.0–10.5)

## 2014-07-08 LAB — COMPREHENSIVE METABOLIC PANEL
ALT: 51 U/L (ref 14–54)
ANION GAP: 9 (ref 5–15)
AST: 161 U/L — ABNORMAL HIGH (ref 15–41)
Albumin: 3.2 g/dL — ABNORMAL LOW (ref 3.5–5.0)
Alkaline Phosphatase: 128 U/L — ABNORMAL HIGH (ref 38–126)
CALCIUM: 8.8 mg/dL — AB (ref 8.9–10.3)
CO2: 25 mmol/L (ref 22–32)
CREATININE: 0.55 mg/dL (ref 0.44–1.00)
Chloride: 100 mmol/L — ABNORMAL LOW (ref 101–111)
GLUCOSE: 112 mg/dL — AB (ref 70–99)
Potassium: 3.5 mmol/L (ref 3.5–5.1)
Sodium: 134 mmol/L — ABNORMAL LOW (ref 135–145)
TOTAL PROTEIN: 8.6 g/dL — AB (ref 6.5–8.1)
Total Bilirubin: 1.7 mg/dL — ABNORMAL HIGH (ref 0.3–1.2)

## 2014-07-08 LAB — BRAIN NATRIURETIC PEPTIDE: B NATRIURETIC PEPTIDE 5: 22 pg/mL (ref 0.0–100.0)

## 2014-07-08 MED ORDER — LISINOPRIL-HYDROCHLOROTHIAZIDE 20-25 MG PO TABS: 1.0000 | ORAL_TABLET | Freq: Every day | ORAL | Status: DC

## 2014-07-08 MED ORDER — PREDNISONE 10 MG PO TABS: 20.0000 mg | ORAL_TABLET | Freq: Every day | ORAL | Status: DC

## 2014-07-08 MED ORDER — AZITHROMYCIN 250 MG PO TABS: ORAL_TABLET | ORAL | Status: DC

## 2014-07-08 NOTE — ED Provider Notes (Signed)
CSN: [id number]     Arrival date & time 07/08/14  8182 History  This chart was scribed for Milton Ferguson, MD by Erling Conte, ED Scribe. This patient was seen in room APA08/AP[facility]08 and the patient's care was started at 9:02 AM.      Chief Complaint  Patient presents with  . Multiple Complaints     Patient is a 44 y.o. female presenting with cough. The history is provided by the patient. No language interpreter was used.  Cough Cough characteristics:  Productive Sputum characteristics:  Yellow Severity:  Mild Onset quality:  Gradual Duration:  1 week Timing:  Intermittent Progression:  Waxing and waning Chronicity:  Recurrent Smoker: no   Relieved by:  None tried Worsened by:  Nothing tried Ineffective treatments:  None tried Associated symptoms: shortness of breath and sinus congestion   Associated symptoms: no chest pain, no chills, no eye discharge, no fever, no headaches and no rash     HPI Comments: Whitney Bush is a 44 y.o. female with a h/o HTN, depression, and migraines who presents to the Emergency Department complaining of an intermittent, mild cough productive of yellow sputum for 1 week. She states she is having associated SOB, nasal congestion, and heart palpitations. he states she had these same symptoms 1 years ago. She reports she followed up with a doctor about this and she was put on antibiotics for an URI. Pt does not remember what medication she took. Pt regularly takes lisinopril for her HTN. She denies having a PCP. Pt is a non smoker. She denies any fever or chills.   Past Medical History  Diagnosis Date  . Hypertension   . Depression   . Migraines   . Peripheral edema   . Alcohol abuse    Past Surgical History  Procedure Laterality Date  . Abcess drainage     Family History  Problem Relation Age of Onset  . Diabetes Father   . Cancer Other    History  Substance Use Topics  . Smoking status: Former Smoker -- 1.00 packs/day for 5 years   Types: Cigarettes    Quit date: 12/29/2010  . Smokeless tobacco: Current User    Types: Snuff  . Alcohol Use: 2.4 oz/week    4 Glasses of wine per week     Comment: occ   OB History    Gravida Para Term Preterm AB TAB SAB Ectopic Multiple Living   2 1 1  1     1      Review of Systems  Constitutional: Negative for fever, chills, appetite change and fatigue.  HENT: Negative for congestion, ear discharge and sinus pressure.   Eyes: Negative for discharge.  Respiratory: Positive for cough and shortness of breath.   Cardiovascular: Negative for chest pain.  Gastrointestinal: Negative for abdominal pain and diarrhea.  Genitourinary: Negative for frequency and hematuria.  Musculoskeletal: Negative for back pain.  Skin: Negative for rash.  Neurological: Negative for seizures and headaches.  Psychiatric/Behavioral: Negative for hallucinations.      Allergies  Review of patient's allergies indicates no known allergies.  Home Medications   Prior to Admission medications   Medication Sig Start Date End Date Taking? Authorizing Provider  acetaminophen-codeine (TYLENOL #3) 300-30 MG per tablet Take 1-2 tablets by mouth every 6 (six) hours as needed for moderate pain. Patient not taking: Reported on 04/17/2014 07/29/13   Glendell Docker, NP  aspirin-sod bicarb-citric acid (ALKA-SELTZER) 325 MG TBEF tablet Take 650 mg by mouth every  6 (six) hours as needed (indigestion).    Historical Provider, MD  benzonatate (TESSALON) 100 MG capsule Take 1 capsule (100 mg total) by mouth every 8 (eight) hours. Patient not taking: Reported on 04/17/2014 02/22/14   Ashley Murrain, NP  cyclobenzaprine (FLEXERIL) 10 MG tablet Take 1 tablet (10 mg total) by mouth 2 (two) times daily as needed for muscle spasms. Patient not taking: Reported on 04/17/2014 02/22/14   Ashley Murrain, NP  HYDROcodone-acetaminophen (NORCO/VICODIN) 5-325 MG per tablet Take 1 tablet by mouth every 4 (four) hours as needed. Patient not  taking: Reported on 04/17/2014 02/22/14   Ashley Murrain, NP  ibuprofen (ADVIL,MOTRIN) 200 MG tablet Take 400 mg by mouth every 6 (six) hours as needed. For pain    Historical Provider, MD  lisinopril-hydrochlorothiazide (PRINZIDE,ZESTORETIC) 20-25 MG per tablet Take 1 tablet by mouth daily. 04/17/14   Evalee Jefferson, PA-C  loratadine (CLARITIN) 10 MG tablet Take 1 tablet (10 mg total) by mouth daily. Patient not taking: Reported on 04/17/2014 02/22/14   Ashley Murrain, NP  LORazepam (ATIVAN) 1 MG tablet Take 1 tablet (1 mg total) by mouth 3 (three) times daily as needed for anxiety. 04/17/14   Evalee Jefferson, PA-C  sulfamethoxazole-trimethoprim (SEPTRA DS) 800-160 MG per tablet Take 1 tablet by mouth every 12 (twelve) hours. 04/17/14   Evalee Jefferson, PA-C   Triage Vitals: BP 144/108 mmHg  Pulse 111  Temp(Src) 98.9 F (37.2 C) (Oral)  Resp 20  Ht 5\' 3"  (1.6 m)  Wt 260 lb (117.935 kg)  BMI 46.07 kg/m2  SpO2 100%  LMP 07/01/2014  Physical Exam  Constitutional: She is oriented to person, place, and time. She appears well-developed.  HENT:  Head: Normocephalic.  Eyes: Conjunctivae and EOM are normal. No scleral icterus.  Neck: Neck supple. No thyromegaly present.  Cardiovascular: Normal rate and regular rhythm.  Exam reveals no gallop and no friction rub.   No murmur heard. Pulmonary/Chest: No stridor. She has no wheezes. She has no rales. She exhibits no tenderness.  Abdominal: She exhibits no distension. There is no tenderness. There is no rebound.  Musculoskeletal: Normal range of motion. She exhibits no edema.  Lymphadenopathy:    She has no cervical adenopathy.  Neurological: She is oriented to person, place, and time. She exhibits normal muscle tone. Coordination normal.  Skin: No rash noted. No erythema.  Psychiatric: She has a normal mood and affect. Her behavior is normal.    ED Course  Procedures (including critical care time)  DIAGNOSTIC STUDIES: Oxygen Saturation is 100% on RA, normal by  my interpretation.    COORDINATION OF CARE: 9:04 PM- Will order CXR, CBC w/diff, CMP, BNP, and 12-lead EKG. Pt advised of plan for treatment and pt agrees.    Labs Review Labs Reviewed - No data to display  Imaging Review Dg Chest Portable 1 View  07/08/2014   CLINICAL DATA:  Cough for 2 days  EXAM: PORTABLE CHEST - 1 VIEW  COMPARISON:  April 17, 2014  FINDINGS: Lungs are clear. Heart size and pulmonary vascularity are normal. No adenopathy. No bone lesions.  IMPRESSION: No edema or consolidation.   Electronically Signed   By: Lowella Grip III M.D.   On: 07/08/2014 09:31     EKG Interpretation None      MDM   Final diagnoses:  None   .The chart was scribed for me under my direct supervision.  I personally performed the history, physical, and medical decision making and  all procedures in the evaluation of this patient.Samuel [place],  tx with zpak and prednisone                                                                                                                                     Milton Ferguson, MD 07/08/14 1018

## 2014-07-08 NOTE — Discharge Instructions (Signed)
Follow up in one week if not improving.

## 2014-07-08 NOTE — ED Notes (Addendum)
Pt states that she has been having a lot of nasal and chest congestion for the past week.  Has not been able sleep well for about 2 months and has been having numbness in legs for the past 4 months or so.  Also c/o back pain.

## 2014-12-02 ENCOUNTER — Encounter (HOSPITAL_COMMUNITY): Payer: Self-pay | Admitting: Emergency Medicine

## 2014-12-02 ENCOUNTER — Inpatient Hospital Stay (HOSPITAL_COMMUNITY)
Admission: EM | Admit: 2014-12-02 | Discharge: 2014-12-05 | DRG: 433 | Disposition: A | Discharge status: Home / Self Care | Payer: Self-pay | Attending: Internal Medicine | Admitting: Internal Medicine

## 2014-12-02 ENCOUNTER — Emergency Department (HOSPITAL_COMMUNITY): Payer: Self-pay

## 2014-12-02 DIAGNOSIS — D259 Leiomyoma of uterus, unspecified: Secondary | ICD-10-CM | POA: Diagnosis present

## 2014-12-02 DIAGNOSIS — R197 Diarrhea, unspecified: Secondary | ICD-10-CM | POA: Insufficient documentation

## 2014-12-02 DIAGNOSIS — D649 Anemia, unspecified: Secondary | ICD-10-CM

## 2014-12-02 DIAGNOSIS — I10 Essential (primary) hypertension: Secondary | ICD-10-CM | POA: Diagnosis present

## 2014-12-02 DIAGNOSIS — R109 Unspecified abdominal pain: Secondary | ICD-10-CM | POA: Diagnosis present

## 2014-12-02 DIAGNOSIS — Z87891 Personal history of nicotine dependence: Secondary | ICD-10-CM

## 2014-12-02 DIAGNOSIS — F1020 Alcohol dependence, uncomplicated: Secondary | ICD-10-CM | POA: Diagnosis present

## 2014-12-02 DIAGNOSIS — K921 Melena: Secondary | ICD-10-CM | POA: Diagnosis present

## 2014-12-02 DIAGNOSIS — K7460 Unspecified cirrhosis of liver: Secondary | ICD-10-CM

## 2014-12-02 DIAGNOSIS — B1920 Unspecified viral hepatitis C without hepatic coma: Secondary | ICD-10-CM | POA: Diagnosis present

## 2014-12-02 DIAGNOSIS — Z8049 Family history of malignant neoplasm of other genital organs: Secondary | ICD-10-CM

## 2014-12-02 DIAGNOSIS — K7031 Alcoholic cirrhosis of liver with ascites: Principal | ICD-10-CM | POA: Diagnosis present

## 2014-12-02 DIAGNOSIS — D638 Anemia in other chronic diseases classified elsewhere: Secondary | ICD-10-CM | POA: Diagnosis present

## 2014-12-02 DIAGNOSIS — N920 Excessive and frequent menstruation with regular cycle: Secondary | ICD-10-CM | POA: Diagnosis present

## 2014-12-02 DIAGNOSIS — R17 Unspecified jaundice: Secondary | ICD-10-CM | POA: Diagnosis present

## 2014-12-02 DIAGNOSIS — R110 Nausea: Secondary | ICD-10-CM

## 2014-12-02 DIAGNOSIS — F1010 Alcohol abuse, uncomplicated: Secondary | ICD-10-CM | POA: Diagnosis present

## 2014-12-02 DIAGNOSIS — K7030 Alcoholic cirrhosis of liver without ascites: Secondary | ICD-10-CM | POA: Insufficient documentation

## 2014-12-02 DIAGNOSIS — Z801 Family history of malignant neoplasm of trachea, bronchus and lung: Secondary | ICD-10-CM

## 2014-12-02 DIAGNOSIS — R112 Nausea with vomiting, unspecified: Secondary | ICD-10-CM | POA: Insufficient documentation

## 2014-12-02 DIAGNOSIS — R1084 Generalized abdominal pain: Secondary | ICD-10-CM

## 2014-12-02 DIAGNOSIS — Z833 Family history of diabetes mellitus: Secondary | ICD-10-CM

## 2014-12-02 LAB — COMPREHENSIVE METABOLIC PANEL
ALBUMIN: 1.9 g/dL — AB (ref 3.5–5.0)
ALT: 46 U/L (ref 14–54)
AST: 165 U/L — ABNORMAL HIGH (ref 15–41)
Alkaline Phosphatase: 120 U/L (ref 38–126)
Anion gap: 10 (ref 5–15)
BUN: 6 mg/dL (ref 6–20)
CO2: 27 mmol/L (ref 22–32)
Calcium: 7.4 mg/dL — ABNORMAL LOW (ref 8.9–10.3)
Chloride: 96 mmol/L — ABNORMAL LOW (ref 101–111)
Creatinine, Ser: 0.52 mg/dL (ref 0.44–1.00)
GFR calc Af Amer: >60 mL/min (ref >60)
Glucose, Bld: 103 mg/dL — ABNORMAL HIGH (ref 65–99)
POTASSIUM: 3.3 mmol/L — AB (ref 3.5–5.1)
Sodium: 133 mmol/L — ABNORMAL LOW (ref 135–145)
Total Bilirubin: 10.4 mg/dL — ABNORMAL HIGH (ref 0.3–1.2)
Total Protein: 8.2 g/dL — ABNORMAL HIGH (ref 6.5–8.1)

## 2014-12-02 LAB — CBC WITH DIFFERENTIAL/PLATELET
BASOS ABS: 0 10*3/uL (ref 0.0–0.1)
BASOS PCT: 0 %
EOS ABS: 0 10*3/uL (ref 0.0–0.7)
EOS PCT: 1 %
HCT: 24.5 % — ABNORMAL LOW (ref 36.0–46.0)
Hemoglobin: 7.6 g/dL — ABNORMAL LOW (ref 12.0–15.0)
LYMPHS PCT: 18 %
Lymphs Abs: 1.6 10*3/uL (ref 0.7–4.0)
MCH: 26.9 pg (ref 26.0–34.0)
MCHC: 31 g/dL (ref 30.0–36.0)
MCV: 86.6 fL (ref 78.0–100.0)
MONO ABS: 1 10*3/uL (ref 0.1–1.0)
Monocytes Relative: 11 %
Neutro Abs: 6.1 10*3/uL (ref 1.7–7.7)
Neutrophils Relative %: 70 %
PLATELETS: 121 10*3/uL — AB (ref 150–400)
RBC: 2.83 MIL/uL — AB (ref 3.87–5.11)
RDW: 24.9 % — AB (ref 11.5–15.5)
WBC: 8.7 10*3/uL (ref 4.0–10.5)

## 2014-12-02 LAB — RAPID URINE DRUG SCREEN, HOSP PERFORMED
AMPHETAMINES: NOT DETECTED
BENZODIAZEPINES: NOT DETECTED
Barbiturates: NOT DETECTED
COCAINE: NOT DETECTED
Opiates: NOT DETECTED
Tetrahydrocannabinol: NOT DETECTED

## 2014-12-02 LAB — URINALYSIS, ROUTINE W REFLEX MICROSCOPIC
Glucose, UA: 100 mg/dL — AB
Ketones, ur: 15 mg/dL — AB
NITRITE: POSITIVE — AB
PH: 6.5 (ref 5.0–8.0)
Protein, ur: 100 mg/dL — AB
SPECIFIC GRAVITY, URINE: 1.025 (ref 1.005–1.030)

## 2014-12-02 LAB — AMMONIA: Ammonia: 30 umol/L (ref 9–35)

## 2014-12-02 LAB — RETICULOCYTES
RBC.: 2.8 MIL/uL — AB (ref 3.87–5.11)
RETIC CT PCT: 2.8 % (ref 0.4–3.1)
Retic Count, Absolute: 78.4 10*3/uL (ref 19.0–186.0)

## 2014-12-02 LAB — POC URINE PREG, ED: Preg Test, Ur: NEGATIVE

## 2014-12-02 LAB — AMYLASE: AMYLASE: 24 U/L — AB (ref 28–100)

## 2014-12-02 LAB — LIPASE, BLOOD: Lipase: 44 U/L (ref 22–51)

## 2014-12-02 LAB — ETHANOL: Alcohol, Ethyl (B): <5 mg/dL (ref <5)

## 2014-12-02 LAB — URINE MICROSCOPIC-ADD ON

## 2014-12-02 MED ORDER — INFLUENZA VAC SPLIT QUAD 0.5 ML IM SUSY
0.5000 mL | PREFILLED_SYRINGE | INTRAMUSCULAR | Status: AC
  Administered 2014-12-03: 0.5 mL via INTRAMUSCULAR
  Filled 2014-12-02: qty 0.5

## 2014-12-02 MED ORDER — HYDROCHLOROTHIAZIDE 25 MG PO TABS
25.0000 mg | ORAL_TABLET | Freq: Every day | ORAL | Status: DC
  Administered 2014-12-02 – 2014-12-05 (×4): 25 mg via ORAL
  Filled 2014-12-02 (×4): qty 1

## 2014-12-02 MED ORDER — ONDANSETRON HCL 4 MG/2ML IJ SOLN: 4.0000 mg | Freq: Four times a day (QID) | INTRAMUSCULAR | Status: DC | PRN

## 2014-12-02 MED ORDER — IOHEXOL 300 MG/ML  SOLN
100.0000 mL | Freq: Once | INTRAMUSCULAR | Status: AC | PRN
  Administered 2014-12-03: 100 mL via INTRAVENOUS

## 2014-12-02 MED ORDER — SODIUM CHLORIDE 0.9 % IV BOLUS (SEPSIS)
500.0000 mL | Freq: Once | INTRAVENOUS | Status: AC
  Administered 2014-12-02: 500 mL via INTRAVENOUS

## 2014-12-02 MED ORDER — LISINOPRIL-HYDROCHLOROTHIAZIDE 20-25 MG PO TABS: 1.0000 | ORAL_TABLET | Freq: Every day | ORAL | Status: DC

## 2014-12-02 MED ORDER — IOHEXOL 300 MG/ML  SOLN
25.0000 mL | Freq: Once | INTRAMUSCULAR | Status: AC | PRN
  Administered 2014-12-02: 25 mL via ORAL

## 2014-12-02 MED ORDER — LISINOPRIL 10 MG PO TABS
20.0000 mg | ORAL_TABLET | Freq: Every day | ORAL | Status: DC
  Administered 2014-12-02 – 2014-12-05 (×4): 20 mg via ORAL
  Filled 2014-12-02 (×4): qty 2

## 2014-12-02 MED ORDER — PNEUMOCOCCAL VAC POLYVALENT 25 MCG/0.5ML IJ INJ
0.5000 mL | INJECTION | INTRAMUSCULAR | Status: AC
  Administered 2014-12-03: 0.5 mL via INTRAMUSCULAR
  Filled 2014-12-02: qty 0.5

## 2014-12-02 MED ORDER — FOLIC ACID 1 MG PO TABS
1.0000 mg | ORAL_TABLET | Freq: Every day | ORAL | Status: DC
  Administered 2014-12-02 – 2014-12-05 (×4): 1 mg via ORAL
  Filled 2014-12-02 (×5): qty 1

## 2014-12-02 MED ORDER — SODIUM CHLORIDE 0.9 % IV SOLN
INTRAVENOUS | Status: DC
Start: 2014-12-02 — End: 2014-12-05
  Administered 2014-12-02 – 2014-12-04 (×3): via INTRAVENOUS

## 2014-12-02 MED ORDER — ONDANSETRON HCL 4 MG PO TABS
4.0000 mg | ORAL_TABLET | Freq: Four times a day (QID) | ORAL | Status: DC | PRN
Start: 2014-12-02 — End: 2014-12-05

## 2014-12-02 MED ORDER — VITAMIN B-1 100 MG PO TABS
100.0000 mg | ORAL_TABLET | Freq: Every day | ORAL | Status: DC
  Administered 2014-12-02 – 2014-12-03 (×2): 100 mg via ORAL
  Filled 2014-12-02 (×2): qty 1

## 2014-12-02 NOTE — ED Notes (Signed)
Nurse made x2 attempts at IV. Both unsuccessful.

## 2014-12-02 NOTE — ED Notes (Addendum)
Pt states that she has been having abdominal pain and swelling for the past 2 weeks.  Jaundice present.  Pt states that she was told before that she had liver issues but has not followed up.

## 2014-12-02 NOTE — H&P (Signed)
[facility] History and Physical  Whitney Bush ZDG:[id number] DOB: 12/19/1970 DOA: 12/02/2014  Referring physician: ER PCP: No PCP Per Patient   Chief Complaint: Abdominal pain  HPI: Whitney Bush is a 44 y.o. female  This is a 44 year old lady who gives approximately 2-3 week history of abdominal pain which is been getting worse. It is generalized but appears to be more in the upper abdomen. It has been associated with vomiting but no hematemesis. She also has had diarrhea and she thinks that there has been blood in the stool and sometimes black stools. She drinks alcohol almost on a daily basis, at least 2-3 vodkas every time. Her husband died in 2014/02/25 and she says she has been drinking more since this time. She also has menorrhagia. Evaluation in the emergency room found her to be significantly anemic and also to have jaundice with elevated bilirubin levels. She is now being admitted for further investigation especially in view of abnormal CT scan findings with an abnormal liver.   Review of Systems:  Apart from symptoms above, all systems are negative.  Past Medical History  Diagnosis Date  . Hypertension   . Depression   . Migraines   . Peripheral edema   . Alcohol abuse    Past Surgical History  Procedure Laterality Date  . Abcess drainage     Social History:  reports that she quit smoking about 3 years ago. Her smoking use included Cigarettes. She has a 5 pack-year smoking history. She has quit using smokeless tobacco. Her smokeless tobacco use included Snuff. She reports that she drinks about 2.4 oz of alcohol per week. She reports that she does not use illicit drugs.  No Known Allergies  Family History  Problem Relation Age of Onset  . Diabetes Father   . Cancer Other     Prior to Admission medications   Medication Sig Start Date End Date Taking? Authorizing Provider  acetaminophen (TYLENOL) 500 MG tablet Take 1,000 mg by mouth every 6 (six)  hours as needed for moderate pain.   Yes Historical Provider, MD  aspirin-sod bicarb-citric acid (ALKA-SELTZER) 325 MG TBEF tablet Take 650 mg by mouth every 6 (six) hours as needed (indigestion).   Yes Historical Provider, MD  ibuprofen (ADVIL,MOTRIN) 200 MG tablet Take 400 mg by mouth every 6 (six) hours as needed for moderate pain.    Yes Historical Provider, MD  lisinopril-hydrochlorothiazide (PRINZIDE,ZESTORETIC) 20-25 MG per tablet Take 1 tablet by mouth daily. 07/08/14  Yes Milton Ferguson, MD   Physical Exam: Filed Vitals:   12/02/14 1432 12/02/14 1825 12/02/14 2006 12/02/14 2042  BP: 116/72 117/65 100/69 135/70  Pulse: 113 110 112 126  Temp:  98.7 F (37.1 C) 98.3 F (36.8 C) 99 F (37.2 C)  TempSrc:  Oral Oral Oral  Resp: 18 20 20 20   Height:      Weight:    109.997 kg (242 lb 8 oz)  SpO2: 100% 100% 100% 100%    Wt Readings from Last 3 Encounters:  12/02/14 109.997 kg (242 lb 8 oz)  07/08/14 117.935 kg (260 lb)  04/17/14 114.941 kg (253 lb 6.4 oz)    General:  Appears jaundiced. She is obese. She is alert. Somewhat pale. Eyes: PERRL, normal lids, irises & conjunctiva ENT: grossly normal hearing, lips & tongue Neck: no LAD, masses or thyromegaly Cardiovascular: RRR, no m/r/g. No LE edema. Telemetry: SR, no arrhythmias  Respiratory: CTA bilaterally, no w/r/r. Normal respiratory effort. Abdomen: soft, tender  in a generalized fashion but no rebound or guarding. She does not appear to have an acute abdomen clinically. Skin: no rash or induration seen on limited exam Musculoskeletal: grossly normal tone BUE/BLE Psychiatric: grossly normal mood and affect, speech fluent and appropriate Neurologic: grossly non-focal.          Labs on Admission:  Basic Metabolic Panel:  Recent Labs Lab 12/02/14 1153  NA 133*  K 3.3*  CL 96*  CO2 27  GLUCOSE 103*  BUN 6  CREATININE 0.52  CALCIUM 7.4*   Liver Function Tests:  Recent Labs Lab 12/02/14 1153  AST 165*  ALT 46    ALKPHOS 120  BILITOT 10.4*  PROT 8.2*  ALBUMIN 1.9*    Recent Labs Lab 12/02/14 1153  LIPASE 44  AMYLASE 24*    Recent Labs Lab 12/02/14 1405  AMMONIA 30   CBC:  Recent Labs Lab 12/02/14 1153  WBC 8.7  NEUTROABS 6.1  HGB 7.6*  HCT 24.5*  MCV 86.6  PLT 121*   Cardiac Enzymes: No results for input(s): CKTOTAL, CKMB, CKMBINDEX, TROPONINI in the last 168 hours.  BNP (last 3 results)  Recent Labs  07/08/14 0912  BNP 22.0    ProBNP (last 3 results) No results for input(s): PROBNP in the last 8760 hours.  CBG: No results for input(s): GLUCAP in the last 168 hours.  Radiological Exams on Admission: Ct Abdomen Pelvis Wo Contrast  12/02/2014   CLINICAL DATA:  Nausea for 2 weeks. Elevated bilirubin level. Jaundice.  EXAM: CT ABDOMEN AND PELVIS WITHOUT CONTRAST  TECHNIQUE: Multidetector CT imaging of the abdomen and pelvis was performed following the standard protocol without IV contrast.  COMPARISON:  None.  FINDINGS: Lack of intravenous contrast severely limits the examination.  The liver is heterogeneous. Detail is obscured by a lack of intravenous contrast.  Gallbladder, spleen, pancreas, adrenal glands, and kidneys are within normal limits.  The uterus is lobulated worrisome for fibroids. Bladder is decompressed.  There is a small amount of free fluid scattered throughout the peritoneal space.  There is stranding throughout the peritoneal fat as well as the subcutaneous fat worrisome for anasarca.  Bowel is decompressed.  There is no free intraperitoneal gas.  No vertebral compression deformity.  Tiny left pleural effusion.  IMPRESSION: The liver is diffusely heterogeneous which may be related to diffuse hepatic parenchymal disease or diffuse infiltration by tumor. Contrast enhanced CT or MRI of the liver is recommended.  Free-fluid  Diffuse edema.  Tiny left pleural effusion.  Uterine fibroids.   Electronically Signed   By: Marybelle Killings M.D.   On: 12/02/2014 18:03   Dg  Abd Acute W/chest  12/02/2014   CLINICAL DATA:  Generalized abdominal pain for several weeks.  EXAM: DG ABDOMEN ACUTE W/ 1V CHEST  COMPARISON:  Jul 08, 2014.  FINDINGS: There is no evidence of dilated bowel loops or free intraperitoneal air. No radiopaque calculi or other significant radiographic abnormality is seen. Heart size and mediastinal contours are within normal limits. Both lungs are clear.  IMPRESSION: No evidence of bowel obstruction or ileus. No acute cardiopulmonary disease.   Electronically Signed   By: Marijo Conception, M.D.   On: 12/02/2014 13:54      Assessment/Plan   1. Abdominal pain. The etiology is not entirely clear to me. CT scan findings show a diffusely heterogeneous liver with diffuse hepatic parenchymal disease. I wonder if this represents cirrhosis. She does not appear to have any other acute bowel problem.  2. Anemia. This is probably multifactorial. She does have menorrhagia. She also describes a vague history of black stools. Her MCVs normal but with her alcohol abuse, she may also have folate deficiency. Check anemia panel. 3. Jaundice. Her alkaline phosphatase is within normal limits. This is probably a hepatic related jaundice instead of obstructive jaundice. Hepatitis panel. 4. Alcohol abuse. This is the likely cause of several of the abnormalities that we are seeing now. 5. Hypertension. Stable.  She will be admitted to the medical floor. I will ask gastroenterology to see this patient. She likely needs EGD and possibly a liver biopsy.   Code Status: Full code  DVT Prophylaxis: SCDs.  Family Communication: I discussed the plan with the patient at the bedside.   Disposition Plan: Home when medically stable.   Time spent: 60 minutes.  Doree Albee [facility] Pager [phone].

## 2014-12-02 NOTE — ED Notes (Signed)
CT called nurse, IV site infiltrated while giving contrast.

## 2014-12-02 NOTE — ED Notes (Signed)
Attempted IV times 2.  Without success.

## 2014-12-02 NOTE — ED Provider Notes (Addendum)
CSN: [id number]     Arrival date & time 12/02/14  1108 History   First MD Initiated Contact with Patient 12/02/14 1254     Chief Complaint  Patient presents with  . Abdominal Pain     (Consider location/radiation/quality/duration/timing/severity/associated sxs/prior Treatment) HPI...... Level 5 caveat for urgent need for intervention. Patient complains of abdominal fullness, peripheral edema, jaundicing.  History of alcohol consumption. Questionable liver disease in the past, but uncertain hx from pt.  Review of systems positive for yellow sclera.  No primary care doctor. Severity is moderate.  Past Medical History  Diagnosis Date  . Hypertension   . Depression   . Migraines   . Peripheral edema   . Alcohol abuse    Past Surgical History  Procedure Laterality Date  . Abcess drainage     Family History  Problem Relation Age of Onset  . Diabetes Father   . Cancer Other    Social History  Substance Use Topics  . Smoking status: Former Smoker -- 1.00 packs/day for 5 years    Types: Cigarettes    Quit date: 12/29/2010  . Smokeless tobacco: Former Systems developer    Types: Snuff  . Alcohol Use: 2.4 oz/week    4 Glasses of wine per week     Comment: occ   OB History    Gravida Para Term Preterm AB TAB SAB Ectopic Multiple Living   2 1 1  1     1      Review of Systems  Unable to perform ROS: Acuity of condition      Allergies  Review of patient's allergies indicates no known allergies.  Home Medications   Prior to Admission medications   Medication Sig Start Date End Date Taking? Authorizing Provider  acetaminophen (TYLENOL) 500 MG tablet Take 1,000 mg by mouth every 6 (six) hours as needed for moderate pain.   Yes Historical Provider, MD  aspirin-sod bicarb-citric acid (ALKA-SELTZER) 325 MG TBEF tablet Take 650 mg by mouth every 6 (six) hours as needed (indigestion).   Yes Historical Provider, MD  ibuprofen (ADVIL,MOTRIN) 200 MG tablet Take 400 mg by mouth every 6 (six)  hours as needed for moderate pain.    Yes Historical Provider, MD  lisinopril-hydrochlorothiazide (PRINZIDE,ZESTORETIC) 20-25 MG per tablet Take 1 tablet by mouth daily. 07/08/14  Yes Milton Ferguson, MD   BP 117/65 mmHg  Pulse 110  Temp(Src) 98.7 F (37.1 C) (Oral)  Resp 20  Ht 5\' 3"  (1.6 m)  Wt 240 lb (108.863 kg)  BMI 42.52 kg/m2  SpO2 100%  LMP 09/03/2014 Physical Exam  Constitutional: She is oriented to person, place, and time. She appears well-developed and well-nourished.  HENT:  Head: Normocephalic and atraumatic.  Eyes: Conjunctivae and EOM are normal. Pupils are equal, round, and reactive to light.  Scleral icterus  Neck: Normal range of motion. Neck supple.  Cardiovascular: Normal rate and regular rhythm.   Pulmonary/Chest: Effort normal and breath sounds normal.  Abdominal: Soft. Bowel sounds are normal.  Generalized diffuse tenderness  Musculoskeletal: Normal range of motion.  Neurological: She is alert and oriented to person, place, and time.  Skin:  3-4+ peripheral edema  Psychiatric: She has a normal mood and affect. Her behavior is normal.  Nursing note and vitals reviewed.   ED Course  Procedures (including critical care time) Labs Review Labs Reviewed  AMYLASE - Abnormal; Notable for the following:    Amylase 24 (*)    All other components within normal limits  CBC  WITH DIFFERENTIAL/PLATELET - Abnormal; Notable for the following:    RBC 2.83 (*)    Hemoglobin 7.6 (*)    HCT 24.5 (*)    RDW 24.9 (*)    Platelets 121 (*)    All other components within normal limits  COMPREHENSIVE METABOLIC PANEL - Abnormal; Notable for the following:    Sodium 133 (*)    Potassium 3.3 (*)    Chloride 96 (*)    Glucose, Bld 103 (*)    Calcium 7.4 (*)    Total Protein 8.2 (*)    Albumin 1.9 (*)    AST 165 (*)    Total Bilirubin 10.4 (*)    All other components within normal limits  URINALYSIS, ROUTINE W REFLEX MICROSCOPIC (NOT AT [facility]) - Abnormal; Notable for the  following:    Color, Urine ORANGE (*)    APPearance CLOUDY (*)    Glucose, UA 100 (*)    Hgb urine dipstick LARGE (*)    Bilirubin Urine LARGE (*)    Ketones, ur 15 (*)    Protein, ur 100 (*)    Urobilinogen, UA >8.0 (*)    Nitrite POSITIVE (*)    Leukocytes, UA TRACE (*)    All other components within normal limits  LIPASE, BLOOD  URINE MICROSCOPIC-ADD ON  URINE RAPID DRUG SCREEN, HOSP PERFORMED  ETHANOL  AMMONIA  POC URINE PREG, ED    Imaging Review Ct Abdomen Pelvis Wo Contrast  12/02/2014   CLINICAL DATA:  Nausea for 2 weeks. Elevated bilirubin level. Jaundice.  EXAM: CT ABDOMEN AND PELVIS WITHOUT CONTRAST  TECHNIQUE: Multidetector CT imaging of the abdomen and pelvis was performed following the standard protocol without IV contrast.  COMPARISON:  None.  FINDINGS: Lack of intravenous contrast severely limits the examination.  The liver is heterogeneous. Detail is obscured by a lack of intravenous contrast.  Gallbladder, spleen, pancreas, adrenal glands, and kidneys are within normal limits.  The uterus is lobulated worrisome for fibroids. Bladder is decompressed.  There is a small amount of free fluid scattered throughout the peritoneal space.  There is stranding throughout the peritoneal fat as well as the subcutaneous fat worrisome for anasarca.  Bowel is decompressed.  There is no free intraperitoneal gas.  No vertebral compression deformity.  Tiny left pleural effusion.  IMPRESSION: The liver is diffusely heterogeneous which may be related to diffuse hepatic parenchymal disease or diffuse infiltration by tumor. Contrast enhanced CT or MRI of the liver is recommended.  Free-fluid  Diffuse edema.  Tiny left pleural effusion.  Uterine fibroids.   Electronically Signed   By: Marybelle Killings M.D.   On: 12/02/2014 18:03   Dg Abd Acute W/chest  12/02/2014   CLINICAL DATA:  Generalized abdominal pain for several weeks.  EXAM: DG ABDOMEN ACUTE W/ 1V CHEST  COMPARISON:  Jul 08, 2014.  FINDINGS:  There is no evidence of dilated bowel loops or free intraperitoneal air. No radiopaque calculi or other significant radiographic abnormality is seen. Heart size and mediastinal contours are within normal limits. Both lungs are clear.  IMPRESSION: No evidence of bowel obstruction or ileus. No acute cardiopulmonary disease.   Electronically Signed   By: Marijo Conception, M.D.   On: 12/02/2014 13:54   I have personally reviewed and evaluated these images and lab results as part of my medical decision-making.   EKG Interpretation None      MDM   Final diagnoses:  Abdominal pain, unspecified abdominal location  Elevated bilirubin  Anemia, unspecified anemia type    Patient has obvious elevated bilirubin. Hemoglobin has dropped to 7.6. CT scan shows diffuse hepatic parenchymal disease.  Discussed with gen medicine. Admit.    Nat Christen, MD 12/02/14 [place], MD 12/02/14 2006

## 2014-12-02 NOTE — ED Notes (Signed)
PICC Nurse at bedside.    Picc nurse established large bore IV in pts arm. MD approved this change from PICC line

## 2014-12-02 NOTE — ED Notes (Signed)
PICC nurse made aware of situation and is going to evaluate status of placement. MD made aware and given order to continue on with CT without contrast.

## 2014-12-03 ENCOUNTER — Encounter (HOSPITAL_COMMUNITY): Payer: Self-pay | Admitting: Gastroenterology

## 2014-12-03 ENCOUNTER — Inpatient Hospital Stay (HOSPITAL_COMMUNITY): Payer: Self-pay

## 2014-12-03 DIAGNOSIS — E878 Other disorders of electrolyte and fluid balance, not elsewhere classified: Secondary | ICD-10-CM

## 2014-12-03 DIAGNOSIS — F1010 Alcohol abuse, uncomplicated: Secondary | ICD-10-CM

## 2014-12-03 DIAGNOSIS — D649 Anemia, unspecified: Secondary | ICD-10-CM

## 2014-12-03 DIAGNOSIS — R197 Diarrhea, unspecified: Secondary | ICD-10-CM

## 2014-12-03 DIAGNOSIS — R112 Nausea with vomiting, unspecified: Secondary | ICD-10-CM

## 2014-12-03 DIAGNOSIS — R17 Unspecified jaundice: Secondary | ICD-10-CM

## 2014-12-03 DIAGNOSIS — R1013 Epigastric pain: Secondary | ICD-10-CM

## 2014-12-03 LAB — COMPREHENSIVE METABOLIC PANEL
ALK PHOS: 108 U/L (ref 38–126)
ALT: 45 U/L (ref 14–54)
AST: 156 U/L — AB (ref 15–41)
Albumin: 1.8 g/dL — ABNORMAL LOW (ref 3.5–5.0)
Anion gap: 8 (ref 5–15)
BILIRUBIN TOTAL: 10.7 mg/dL — AB (ref 0.3–1.2)
BUN: 6 mg/dL (ref 6–20)
CALCIUM: 7.4 mg/dL — AB (ref 8.9–10.3)
CO2: 27 mmol/L (ref 22–32)
CREATININE: 0.5 mg/dL (ref 0.44–1.00)
Chloride: 96 mmol/L — ABNORMAL LOW (ref 101–111)
Glucose, Bld: 96 mg/dL (ref 65–99)
Potassium: 3.1 mmol/L — ABNORMAL LOW (ref 3.5–5.1)
Sodium: 131 mmol/L — ABNORMAL LOW (ref 135–145)
TOTAL PROTEIN: 8.1 g/dL (ref 6.5–8.1)

## 2014-12-03 LAB — CBC
HCT: 23.7 % — ABNORMAL LOW (ref 36.0–46.0)
Hemoglobin: 7.4 g/dL — ABNORMAL LOW (ref 12.0–15.0)
MCH: 27.5 pg (ref 26.0–34.0)
MCHC: 31.2 g/dL (ref 30.0–36.0)
MCV: 88.1 fL (ref 78.0–100.0)
PLATELETS: 117 10*3/uL — AB (ref 150–400)
RBC: 2.69 MIL/uL — AB (ref 3.87–5.11)
RDW: 25.4 % — AB (ref 11.5–15.5)
WBC: 9.6 10*3/uL (ref 4.0–10.5)

## 2014-12-03 LAB — VITAMIN B12: VITAMIN B 12: 421 pg/mL (ref 180–914)

## 2014-12-03 LAB — IRON AND TIBC
Iron: 124 ug/dL (ref 28–170)
SATURATION RATIOS: 68 % — AB (ref 10.4–31.8)
TIBC: 183 ug/dL — ABNORMAL LOW (ref 250–450)
UIBC: 59 ug/dL

## 2014-12-03 LAB — C DIFFICILE QUICK SCREEN W PCR REFLEX
C DIFFICILE (CDIFF) TOXIN: NEGATIVE
C DIFFICLE (CDIFF) ANTIGEN: NEGATIVE
C Diff interpretation: NEGATIVE

## 2014-12-03 LAB — PROTIME-INR
INR: 2.1 — ABNORMAL HIGH (ref 0.00–1.49)
PROTHROMBIN TIME: 23.4 s — AB (ref 11.6–15.2)

## 2014-12-03 LAB — FOLATE: FOLATE: 6 ng/mL (ref >5.9)

## 2014-12-03 LAB — FERRITIN: Ferritin: 84 ng/mL (ref 11–307)

## 2014-12-03 MED ORDER — POTASSIUM CHLORIDE CRYS ER 20 MEQ PO TBCR
40.0000 meq | EXTENDED_RELEASE_TABLET | Freq: Once | ORAL | Status: AC
  Administered 2014-12-03: 40 meq via ORAL
  Filled 2014-12-03: qty 2

## 2014-12-03 MED ORDER — THIAMINE HCL 100 MG/ML IJ SOLN
100.0000 mg | Freq: Every day | INTRAMUSCULAR | Status: DC
  Filled 2014-12-03: qty 2

## 2014-12-03 MED ORDER — ADULT MULTIVITAMIN W/MINERALS CH
1.0000 | ORAL_TABLET | Freq: Every day | ORAL | Status: DC
  Administered 2014-12-03 – 2014-12-05 (×3): 1 via ORAL
  Filled 2014-12-03 (×3): qty 1

## 2014-12-03 MED ORDER — LORAZEPAM 2 MG/ML IJ SOLN: 0.0000 mg | Freq: Two times a day (BID) | INTRAMUSCULAR | Status: DC

## 2014-12-03 MED ORDER — PANTOPRAZOLE SODIUM 40 MG PO TBEC
40.0000 mg | DELAYED_RELEASE_TABLET | Freq: Every day | ORAL | Status: DC
  Administered 2014-12-03 – 2014-12-05 (×3): 40 mg via ORAL
  Filled 2014-12-03 (×3): qty 1

## 2014-12-03 MED ORDER — FOLIC ACID 1 MG PO TABS: 1.0000 mg | ORAL_TABLET | Freq: Every day | ORAL | Status: DC

## 2014-12-03 MED ORDER — LORAZEPAM 1 MG PO TABS: 1.0000 mg | ORAL_TABLET | Freq: Four times a day (QID) | ORAL | Status: DC | PRN

## 2014-12-03 MED ORDER — VITAMIN K1 10 MG/ML IJ SOLN
10.0000 mg | Freq: Once | INTRAMUSCULAR | Status: AC
  Administered 2014-12-03: 10 mg via SUBCUTANEOUS
  Filled 2014-12-03: qty 1

## 2014-12-03 MED ORDER — LORAZEPAM 2 MG/ML IJ SOLN: 1.0000 mg | Freq: Four times a day (QID) | INTRAMUSCULAR | Status: DC | PRN

## 2014-12-03 MED ORDER — LORAZEPAM 2 MG/ML IJ SOLN
0.0000 mg | Freq: Four times a day (QID) | INTRAMUSCULAR | Status: AC
  Administered 2014-12-04: 2 mg via INTRAVENOUS
  Administered 2014-12-05: 1 mg via INTRAVENOUS
  Filled 2014-12-03 (×2): qty 1

## 2014-12-03 MED ORDER — VITAMIN B-1 100 MG PO TABS
100.0000 mg | ORAL_TABLET | Freq: Every day | ORAL | Status: DC
  Administered 2014-12-03 – 2014-12-05 (×3): 100 mg via ORAL
  Filled 2014-12-03 (×3): qty 1

## 2014-12-03 NOTE — Progress Notes (Signed)
Pt has had 4 loose stools, Garlick color, throughout the night. Pt has been placed on enteric precautions per protocol. Spoke with Dr.Jenkins who states to test pt for C.diff. Will continue to monitor.

## 2014-12-03 NOTE — Progress Notes (Signed)
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 -Will need further imaging; likely an MRI. Will order. -Hepatitis serologies ordered by GI. -Will also need colonoscopy +/- EGD at some point, likely as an OP. -High DF; if viral hep serologies negative, start steroids.  ETOH Abuse -Thiamine/folate. -CIWA protocol.  Normocytic Anemia -Likely related to menorrhagia +/- rectal bleeding. -Follow, but no indication for transfusion unless <7.0.  Code Status: Full Code Family Communication: Patient only  Disposition Plan: Home when ready; anticipate 24-48 hours.   Consultants:  GI   Antibiotics:  None   Subjective: Concerned about her medical issues. "I have only been drinking for 4 years. How could I already have cirrhosis?".  Objective: Filed Vitals:   12/02/14 1825 12/02/14 2006 12/02/14 2042 12/03/14 0520  BP: 117/65 100/69 135/70 108/60  Pulse: 110 112 126 108  Temp: 98.7 F (37.1 C) 98.3 F (36.8 C) 99 F (37.2 C) 99.4 F (37.4 C)  TempSrc: Oral Oral Oral Oral  Resp: 20 20 20 20   Height:      Weight:   109.997 kg (242 lb 8 oz)   SpO2: 100% 100% 100% 100%    Intake/Output Summary (Last 24 hours) at 12/03/14 1158 Last data filed at 12/03/14 0637  Gross per 24 hour  Intake 1346.25 ml  Output      0 ml  Net 1346.25 ml   Filed Weights   12/02/14 1125 12/02/14 2042  Weight: 108.863 kg (240 lb) 109.997 kg (242 lb 8 oz)    Exam:   General:  AA Ox3  Cardiovascular: RRR  Respiratory: CTA B  Abdomen: S/NT/ND/+BS  Extremities: no C/C/E   Neurologic:  Non-focal  Data Reviewed: Basic Metabolic Panel:  Recent Labs Lab 12/02/14 1153 12/03/14 0634  NA 133* 131*  K 3.3* 3.1*  CL 96* 96*  CO2 27 27  GLUCOSE 103* 96  BUN 6 6  CREATININE 0.52 0.50  CALCIUM 7.4* 7.4*   Liver Function Tests:  Recent Labs Lab  12/02/14 1153 12/03/14 0634  AST 165* 156*  ALT 46 45  ALKPHOS 120 108  BILITOT 10.4* 10.7*  PROT 8.2* 8.1  ALBUMIN 1.9* 1.8*    Recent Labs Lab 12/02/14 1153  LIPASE 44  AMYLASE 24*    Recent Labs Lab 12/02/14 1405  AMMONIA 30   CBC:  Recent Labs Lab 12/02/14 1153 12/03/14 0634  WBC 8.7 9.6  NEUTROABS 6.1  --   HGB 7.6* 7.4*  HCT 24.5* 23.7*  MCV 86.6 88.1  PLT 121* 117*   Cardiac Enzymes: No results for input(s): CKTOTAL, CKMB, CKMBINDEX, TROPONINI in the last 168 hours. BNP (last 3 results)  Recent Labs  07/08/14 0912  BNP 22.0    ProBNP (last 3 results) No results for input(s): PROBNP in the last 8760 hours.  CBG: No results for input(s): GLUCAP in the last 168 hours.  Recent Results (from the past 240 hour(s))  C difficile quick scan w PCR reflex     Status: None   Collection Time: 12/03/14  6:50 AM  Result Value Ref Range Status   C Diff antigen NEGATIVE NEGATIVE Final   C Diff toxin NEGATIVE NEGATIVE Final   C Diff interpretation Negative for toxigenic C. difficile  Final     Studies: Ct Abdomen Pelvis Wo Contrast  12/02/2014   CLINICAL DATA:  Nausea for 2 weeks. Elevated bilirubin level. Jaundice.  EXAM: CT ABDOMEN AND PELVIS WITHOUT CONTRAST  TECHNIQUE: Multidetector CT imaging of the abdomen and pelvis was performed following the standard protocol without IV contrast.  COMPARISON:  None.  FINDINGS: Lack of intravenous contrast severely limits the examination.  The liver is heterogeneous. Detail is obscured by a lack of intravenous contrast.  Gallbladder, spleen, pancreas, adrenal glands, and kidneys are within normal limits.  The uterus is lobulated worrisome for fibroids. Bladder is decompressed.  There is a small amount of free fluid scattered throughout the peritoneal space.  There is stranding throughout the peritoneal fat as well as the subcutaneous fat worrisome for anasarca.  Bowel is decompressed.  There is no free intraperitoneal gas.   No vertebral compression deformity.  Tiny left pleural effusion.  IMPRESSION: The liver is diffusely heterogeneous which may be related to diffuse hepatic parenchymal disease or diffuse infiltration by tumor. Contrast enhanced CT or MRI of the liver is recommended.  Free-fluid  Diffuse edema.  Tiny left pleural effusion.  Uterine fibroids.   Electronically Signed   By: Marybelle Killings M.D.   On: 12/02/2014 18:03   Dg Abd Acute W/chest  12/02/2014   CLINICAL DATA:  Generalized abdominal pain for several weeks.  EXAM: DG ABDOMEN ACUTE W/ 1V CHEST  COMPARISON:  Jul 08, 2014.  FINDINGS: There is no evidence of dilated bowel loops or free intraperitoneal air. No radiopaque calculi or other significant radiographic abnormality is seen. Heart size and mediastinal contours are within normal limits. Both lungs are clear.  IMPRESSION: No evidence of bowel obstruction or ileus. No acute cardiopulmonary disease.   Electronically Signed   By: Marijo Conception, M.D.   On: 12/02/2014 13:54    Scheduled Meds: . folic acid  1 mg Oral Daily  . hydrochlorothiazide  25 mg Oral Daily  . lisinopril  20 mg Oral Daily  . pantoprazole  40 mg Oral QAC breakfast  . thiamine  100 mg Oral Daily   Continuous Infusions: . sodium chloride 75 mL/hr at 12/03/14 1054    Active Problems:   Absolute anemia   AP (abdominal pain)   Jaundice   Essential hypertension   Alcohol abuse   Abdominal pain   Nausea with vomiting   Diarrhea   Electrolyte abnormality    Time spent: 25 minutes. Greater than 50% of this time was spent in direct contact with the patient coordinating care.    Lelon Frohlich  [facility] Pager [phone]  If 7PM-7AM, please contact night-coverage at [email or web address], password [email or web address] 12/03/2014, 11:58 AM  LOS: 1 day
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
## 2014-12-03 NOTE — Care Management Note (Signed)
Case Management Note  Patient Details  Name: Whitney Bush MRN: [id number] Date of Birth: 04-22-1970  Subjective/Objective:                  Pt admitted from home with anemia and jaundice. Pt lives with family and will return home at discharge. Pt is independent with ADL's.  Action/Plan: Financial counselor is aware of self pay status. Follow up appt made with [facility] and documented on AVS. Pt also made aware. Pt given information for [facility] program. Pt may need MATCH voucher at discharge.   Expected Discharge Date:                  Expected Discharge Plan:  Home/Self Care  In-House Referral:  Financial Counselor  Discharge planning Services  CM Consult, Follow-up appt scheduled, Medication Assistance, [facility]  Post Acute Care Choice:    Choice offered to:  Patient  DME Arranged:    DME Agency:     HH Arranged:    [place] Agency:     Status of Service:  In process, will continue to follow  Medicare Important Message Given:    Date Medicare IM Given:    Medicare IM give by:    Date Additional Medicare IM Given:    Additional Medicare Important Message give by:     If discussed at [place] of Stay Meetings, dates discussed:    Additional Comments:  Joylene Draft, RN 12/03/2014, 3:53 PM

## 2014-12-03 NOTE — Consult Note (Signed)
Referring Provider: Mikki Harbor* Primary Care Physician:  No PCP Per Patient Primary Gastroenterologist:  Garfield Cornea, MD  Reason for Consultation:  Cirrhosis?, abnormal liver on CT, jaundice   HPI: Whitney Bush is a 44 y.o. female presented with several week history of worsening abdominal pain, swelling associated with non-bloody emesis, diarrhea (?blood in stool and ?melena). She drinks etoh almost daily, Vodka. Drinking excessively for four years. Increased etoh use since husband died 10 months ago. Recent DUI. Lives at home with 49 year old daughter. Unemployed. Family history significant for multiple family members with etoh abuse.   Complains of several week history of worsening swelling in legs, abdomen. Upper abdominal pain. Nocturnal fever. Vomiting and diarrhea. 4-5 loose stools daily with some red blood and ?black stools. No recent antibiotic use. Noticed jaundice about one week ago. Started to try to wean off alcohol. Has had some tremors related to it. Reports she takes her BP pill daily but nothing else on daily basis. Heartburn sometimes. Used to take alka-seltzer but none in few weeks.   H/O abnormal menses. No bleeding in two months (Urine preg test negative 12/02/14). Prior to that she would have heavy bleeding (clots) for 2-3 weeks out of every month.    CT A/P without contrast (due to lack of IV access) showed diffusely heterogeneous liver ?parenchymal disease vs tumor, free-fluid, diffuse edema, uterine fibroids.    Prior to Admission medications   Medication Sig Start Date End Date Taking? Authorizing Provider  acetaminophen (TYLENOL) 500 MG tablet Take 1,000 mg by mouth every 6 (six) hours as needed for moderate pain.   Yes Historical Provider, MD  aspirin-sod bicarb-citric acid (ALKA-SELTZER) 325 MG TBEF tablet Take 650 mg by mouth every 6 (six) hours as needed (indigestion).   Yes Historical Provider, MD  ibuprofen (ADVIL,MOTRIN) 200 MG tablet Take 400  mg by mouth every 6 (six) hours as needed for moderate pain.    Yes Historical Provider, MD  lisinopril-hydrochlorothiazide (PRINZIDE,ZESTORETIC) 20-25 MG per tablet Take 1 tablet by mouth daily. 07/08/14  Yes Milton Ferguson, MD    Current Facility-Administered Medications  Medication Dose Route Frequency Provider Last Rate Last Dose  . 0.9 %  sodium chloride infusion   Intravenous Continuous Doree Albee, MD 75 mL/hr at 12/02/14 2216    . folic acid (FOLVITE) tablet 1 mg  1 mg Oral Daily Nimish C Gosrani, MD   1 mg at 12/03/14 0817  . hydrochlorothiazide (HYDRODIURIL) tablet 25 mg  25 mg Oral Daily Doree Albee, MD   25 mg at 12/03/14 0816  . Influenza vac split quadrivalent PF (FLUARIX) injection 0.5 mL  0.5 mL Intramuscular Tomorrow-1000 Nimish C Gosrani, MD      . iohexol (OMNIPAQUE) 300 MG/ML solution 100 mL  100 mL Intravenous Once PRN Medication Radiologist, MD      . lisinopril (PRINIVIL,ZESTRIL) tablet 20 mg  20 mg Oral Daily Nimish C Anastasio Champion, MD   20 mg at 12/03/14 0816  . ondansetron (ZOFRAN) tablet 4 mg  4 mg Oral Q6H PRN Nimish C Gosrani, MD       Or  . ondansetron (ZOFRAN) injection 4 mg  4 mg Intravenous Q6H PRN Nimish C Gosrani, MD      . pantoprazole (PROTONIX) EC tablet 40 mg  40 mg Oral QAC breakfast Mahala Menghini, PA-C   40 mg at 12/03/14 0817  . pneumococcal 23 valent vaccine (PNU-IMMUNE) injection 0.5 mL  0.5 mL Intramuscular Tomorrow-1000 Nimish C  Anastasio Champion, MD      . thiamine (VITAMIN B-1) tablet 100 mg  100 mg Oral Daily Doree Albee, MD   100 mg at 12/03/14 0816    Allergies as of 12/02/2014  . (No Known Allergies)    Past Medical History  Diagnosis Date  . Hypertension   . Depression   . Migraines   . Peripheral edema   . Alcohol abuse     Past Surgical History  Procedure Laterality Date  . Abcess drainage    . Cesarean section      Family History  Problem Relation Age of Onset  . Diabetes Father   . Cancer Other   . Cervical cancer  Maternal Grandmother   . Lung cancer Maternal Grandfather   . Alcohol abuse Other     multiple family members  . Colon cancer Neg Hx   . Liver disease Neg Hx     Social History   Social History  . Marital Status: Single    Spouse Name: N/A  . Number of Children: 1  . Years of Education: N/A   Occupational History  . unemployed    Social History Main Topics  . Smoking status: Former Smoker -- 1.00 packs/day for 5 years    Types: Cigarettes    Quit date: 12/29/2010  . Smokeless tobacco: Former Systems developer    Types: Snuff  . Alcohol Use: 2.4 oz/week    4 Glasses of wine per week     Comment: most days. several drinks of Vodka. heavy for four years and worse over past 10 months (12/03/14)  . Drug Use: No  . Sexual Activity: Yes    Birth Control/ Protection: None   Other Topics Concern  . Not on file   Social History Narrative     ROS:  General: Negative for anorexia, weight loss, fever, chills, fatigue, weakness. Eyes: Negative for vision changes.  ENT: Negative for hoarseness, difficulty swallowing , nasal congestion. CV: Negative for chest pain, angina, palpitations, dyspnea on exertion, peripheral edema.  Respiratory: Negative for dyspnea at rest, dyspnea on exertion, cough, sputum, wheezing.  GI: See history of present illness. GU:  Negative for dysuria, hematuria, urinary incontinence, urinary frequency, nocturnal urination. See hpi for gyn MS: Negative for joint pain, low back pain.  Derm: Negative for rash. + diffuse itching.  Neuro: Negative for weakness, abnormal sensation, seizure, frequent headaches, memory loss, confusion.  Psych: Negative for anxiety,   suicidal ideation, hallucinations. +depression Endo: Negative for unusual weight change.  Heme: Negative for bruising or bleeding. Allergy: Negative for rash or hives.       Physical Examination: Vital signs in last 24 hours: Temp:  [98.2 F (36.8 C)-99.4 F (37.4 C)] 99.4 F (37.4 C) (10/06 0520) Pulse  Rate:  [108-126] 108 (10/06 0520) Resp:  [18-20] 20 (10/06 0520) BP: (100-135)/(60-75) 108/60 mmHg (10/06 0520) SpO2:  [98 %-100 %] 100 % (10/06 0520) Weight:  [240 lb (108.863 kg)-242 lb 8 oz (109.997 kg)] 242 lb 8 oz (109.997 kg) (10/05 2042) Last BM Date: 12/02/14  General: Well-nourished, well-developed in no acute distress.  Head: Normocephalic, atraumatic.   Eyes: Conjunctiva pink, + scleral icterus. Mouth: Oropharyngeal mucosa moist and pink , no lesions erythema or exudate. Neck: Supple without thyromegaly, masses, or lymphadenopathy.  Lungs: Clear to auscultation bilaterally.  Heart: Regular rate and rhythm, no murmurs rubs or gallops.  Abdomen: Bowel sounds are normal, tender in upper abdomen/tense but soft in lower abdomen, nondistended, no hepatosplenomegaly or  masses, no abdominal bruits or    hernia , no rebound or guarding.  Exam limited by body habitus Rectal: not performed Extremities: 2+lower extremity edema bilaterally. No clubbing, deformity.  Neuro: Alert and oriented x 4 , grossly normal neurologically.  Skin: Warm and dry, no rash or jaundice.   Psych: Alert and cooperative, normal mood and affect.        Intake/Output from previous day: 10/05 0701 - 10/06 0700 In: 1346.3 [P.O.:720; I.V.:626.3] Out: -  Intake/Output this shift:    Lab Results: CBC  Recent Labs  12/02/14 1153 12/03/14 0634  WBC 8.7 9.6  HGB 7.6* 7.4*  HCT 24.5* 23.7*  MCV 86.6 88.1  PLT 121* 117*   BMET  Recent Labs  12/02/14 1153 12/03/14 0634  NA 133* 131*  K 3.3* 3.1*  CL 96* 96*  CO2 27 27  GLUCOSE 103* 96  BUN 6 6  CREATININE 0.52 0.50  CALCIUM 7.4* 7.4*   LFT  Recent Labs  12/02/14 1153 12/03/14 0634  BILITOT 10.4* 10.7*  ALKPHOS 120 108  AST 165* 156*  ALT 46 45  PROT 8.2* 8.1  ALBUMIN 1.9* 1.8*    Lipase  Recent Labs  12/02/14 1153  LIPASE 44    PT/INR  Recent Labs  12/03/14 0634  LABPROT 23.4*  INR 2.10*    POC Urine Preg test:  negative.  Imaging Studies: Ct Abdomen Pelvis Wo Contrast  12/02/2014   CLINICAL DATA:  Nausea for 2 weeks. Elevated bilirubin level. Jaundice.  EXAM: CT ABDOMEN AND PELVIS WITHOUT CONTRAST  TECHNIQUE: Multidetector CT imaging of the abdomen and pelvis was performed following the standard protocol without IV contrast.  COMPARISON:  None.  FINDINGS: Lack of intravenous contrast severely limits the examination.  The liver is heterogeneous. Detail is obscured by a lack of intravenous contrast.  Gallbladder, spleen, pancreas, adrenal glands, and kidneys are within normal limits.  The uterus is lobulated worrisome for fibroids. Bladder is decompressed.  There is a small amount of free fluid scattered throughout the peritoneal space.  There is stranding throughout the peritoneal fat as well as the subcutaneous fat worrisome for anasarca.  Bowel is decompressed.  There is no free intraperitoneal gas.  No vertebral compression deformity.  Tiny left pleural effusion.  IMPRESSION: The liver is diffusely heterogeneous which may be related to diffuse hepatic parenchymal disease or diffuse infiltration by tumor. Contrast enhanced CT or MRI of the liver is recommended.  Free-fluid  Diffuse edema.  Tiny left pleural effusion.  Uterine fibroids.   Electronically Signed   By: Marybelle Killings M.D.   On: 12/02/2014 18:03   Dg Abd Acute W/chest  12/02/2014   CLINICAL DATA:  Generalized abdominal pain for several weeks.  EXAM: DG ABDOMEN ACUTE W/ 1V CHEST  COMPARISON:  Jul 08, 2014.  FINDINGS: There is no evidence of dilated bowel loops or free intraperitoneal air. No radiopaque calculi or other significant radiographic abnormality is seen. Heart size and mediastinal contours are within normal limits. Both lungs are clear.  IMPRESSION: No evidence of bowel obstruction or ileus. No acute cardiopulmonary disease.   Electronically Signed   By: Marijo Conception, M.D.   On: 12/02/2014 13:54  [4 week]   Impression: 44 y/o female with  h/o etoh abuse who presents with jaundice, upper abdominal pain, abdominal distention/anasarca, N/V, diarrhea (blood in stool). In ER, noted to have deep jaundice, significant anemia, electrolyte abnormalities. CT WITHOUT contrast due to lack of IV access at the  time showed diffusely heterogeneous liver likely diffuse hepatic parenchymal disease but infiltration by tumor not excluded and follow up contrast CT or MRI recommended.   Suspected etoh hepatitis in setting of underlying chronic liver disease. DF over 60 at time of admission. Would benefit from prednisolone but rule out chronic viral hepatitis before initiating. Discussed at length with patient, need for complete etoh cessation, effects of chronic etoh abuse both physically/mentally, other coping mechanisms for depression (multiple family losses this year). She reports she desires to stop drinking and wants to do it at home. Not interested in inpatient rehabilitation at this time. Suspect N/V/D likely related to etoh abuse as well.   Normocytic anemia in setting of chronic etoh abuse, menorrhagia, rectal bleeding/?melena. Suspect mixed anemia (IDA/anemia chronic disease). No signs of melena, during admission. No menstrual loss in two months but heavy before. ?fibroids on CT.   Plan: 1. Await pending labs.  2. If viral hepatitis excluded, then start prednisolone 40mg  daily for one month follow by 2 week taper.  3. She will need follow up imaging of liver with contrast. Patient willing to try MRI but states she does have claustrophobia. MRI likely test of choice in this setting but will address with Dr. Gala Romney.  4. She will need follow up with gyn for abnormal menses as outpatient.  5. She will need at minimal colonoscopy for rectal bleeding as outpatient and if documented cirrhosis, she will need EGD as well for screening for esophageal varices.  6. Add PPI. 7. Follow up pending stool studies.  8. Consider Alcohol Withdrawal Prevention  protocol.  We would like to thank you for the opportunity to participate in the care of [place].  Laureen Ochs. Bernarda Caffey [facility] [phone] 10/6/20168:56 AM     LOS: 1 day

## 2014-12-04 DIAGNOSIS — K7460 Unspecified cirrhosis of liver: Secondary | ICD-10-CM

## 2014-12-04 DIAGNOSIS — K7030 Alcoholic cirrhosis of liver without ascites: Secondary | ICD-10-CM | POA: Insufficient documentation

## 2014-12-04 DIAGNOSIS — R17 Unspecified jaundice: Secondary | ICD-10-CM | POA: Insufficient documentation

## 2014-12-04 DIAGNOSIS — R110 Nausea: Secondary | ICD-10-CM

## 2014-12-04 LAB — COMPREHENSIVE METABOLIC PANEL
ALBUMIN: 1.6 g/dL — AB (ref 3.5–5.0)
ALK PHOS: 94 U/L (ref 38–126)
ALT: 46 U/L (ref 14–54)
ANION GAP: 5 (ref 5–15)
AST: 154 U/L — AB (ref 15–41)
BILIRUBIN TOTAL: 8.7 mg/dL — AB (ref 0.3–1.2)
BUN: 7 mg/dL (ref 6–20)
CALCIUM: 7.1 mg/dL — AB (ref 8.9–10.3)
CO2: 27 mmol/L (ref 22–32)
CREATININE: 0.57 mg/dL (ref 0.44–1.00)
Chloride: 98 mmol/L — ABNORMAL LOW (ref 101–111)
GFR calc Af Amer: >60 mL/min (ref >60)
GFR calc non Af Amer: >60 mL/min (ref >60)
GLUCOSE: 110 mg/dL — AB (ref 65–99)
Potassium: 3.5 mmol/L (ref 3.5–5.1)
SODIUM: 130 mmol/L — AB (ref 135–145)
TOTAL PROTEIN: 7 g/dL (ref 6.5–8.1)

## 2014-12-04 LAB — CBC
HCT: 21 % — ABNORMAL LOW (ref 36.0–46.0)
HEMOGLOBIN: 6.6 g/dL — AB (ref 12.0–15.0)
MCH: 27.8 pg (ref 26.0–34.0)
MCHC: 31.4 g/dL (ref 30.0–36.0)
MCV: 88.6 fL (ref 78.0–100.0)
Platelets: 132 10*3/uL — ABNORMAL LOW (ref 150–400)
RBC: 2.37 MIL/uL — ABNORMAL LOW (ref 3.87–5.11)
RDW: 26.4 % — AB (ref 11.5–15.5)
WBC: 9 10*3/uL (ref 4.0–10.5)

## 2014-12-04 LAB — HEPATITIS PANEL, ACUTE
HCV AB: 1 {s_co_ratio} — AB (ref 0.0–0.9)
HEP A IGM: NEGATIVE
HEP B S AG: NEGATIVE
Hep B C IgM: NEGATIVE

## 2014-12-04 LAB — ABO/RH: ABO/RH(D): A POS

## 2014-12-04 LAB — PREPARE RBC (CROSSMATCH)

## 2014-12-04 MED ORDER — SODIUM CHLORIDE 0.9 % IV SOLN
Freq: Once | INTRAVENOUS | Status: AC
  Administered 2014-12-04: 16:00:00 via INTRAVENOUS

## 2014-12-04 NOTE — Progress Notes (Signed)
[facility] PROGRESS NOTE  Whitney Bush YTK:[id number] DOB: [birth year] DOA: 12/02/2014 PCP: No PCP Per Patient  Assessment/Plan: CIrrhosis -HCV Ab +. HCV RNA ordered. 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 -Despite elevated discriminant factor, steroids contraindicated until HCV status sorted out.  ETOH Abuse -Thiamine/folate. -CIWA protocol.  Normocytic Anemia -Likely related to menorrhagia +/- rectal bleeding. -Hb decreased to 6.6 today, likely a combination of dilution and return of menses. -2 units of PRBCs have been ordered.  Code Status: Full Code Family Communication: Patient only  Disposition Plan: Home when ready; anticipate 24-48 hours.   Consultants:  GI   Antibiotics:  None   Subjective: Concerned about her medical issues. "I have only been drinking for 4 years. How could I already have cirrhosis?".  Objective: Filed Vitals:   12/04/14 0000 12/04/14 0622 12/04/14 0925 12/04/14 1456  BP: 110/64 101/64 115/66 106/64  Pulse: 116 114  45  Temp: 99.1 F (37.3 C) 99.4 F (37.4 C)  98.7 F (37.1 C)  TempSrc: Oral Oral  Oral  Resp: 20 19  20   Height:      Weight:      SpO2: 100% 100%  93%    Intake/Output Summary (Last 24 hours) at 12/04/14 1512 Last data filed at 12/04/14 1456  Gross per 24 hour  Intake 1473.75 ml  Output      0 ml  Net 1473.75 ml   Filed Weights   12/02/14 1125 12/02/14 2042  Weight: 108.863 kg (240 lb) 109.997 kg (242 lb 8 oz)    Exam:   General:  AA Ox3  Cardiovascular: RRR  Respiratory: CTA B  Abdomen: S/NT/ND/+BS  Extremities: no C/C/E   Neurologic:  Non-focal  Data Reviewed: Basic Metabolic Panel:  Recent Labs Lab 12/02/14 1153 12/03/14 0634 12/04/14 0640  NA 133* 131* 130*  K 3.3* 3.1* 3.5  CL 96* 96* 98*  CO2 27 27 27   GLUCOSE 103* 96 110*  BUN 6 6 7   CREATININE 0.52 0.50 0.57  CALCIUM 7.4* 7.4* 7.1*   Liver Function Tests:  Recent Labs Lab  12/02/14 1153 12/03/14 0634 12/04/14 0640  AST 165* 156* 154*  ALT 46 45 46  ALKPHOS 120 108 94  BILITOT 10.4* 10.7* 8.7*  PROT 8.2* 8.1 7.0  ALBUMIN 1.9* 1.8* 1.6*    Recent Labs Lab 12/02/14 1153  LIPASE 44  AMYLASE 24*    Recent Labs Lab 12/02/14 1405  AMMONIA 30   CBC:  Recent Labs Lab 12/02/14 1153 12/03/14 0634 12/04/14 0640  WBC 8.7 9.6 9.0  NEUTROABS 6.1  --   --   HGB 7.6* 7.4* 6.6*  HCT 24.5* 23.7* 21.0*  MCV 86.6 88.1 88.6  PLT 121* 117* 132*   Cardiac Enzymes: No results for input(s): CKTOTAL, CKMB, CKMBINDEX, TROPONINI in the last 168 hours. BNP (last 3 results)  Recent Labs  07/08/14 0912  BNP 22.0    ProBNP (last 3 results) No results for input(s): PROBNP in the last 8760 hours.  CBG: No results for input(s): GLUCAP in the last 168 hours.  Recent Results (from the past 240 hour(s))  C difficile quick scan w PCR reflex     Status: None   Collection Time: 12/03/14  6:50 AM  Result Value Ref Range Status   C Diff antigen NEGATIVE NEGATIVE Final   C Diff toxin NEGATIVE NEGATIVE Final   C Diff interpretation Negative for toxigenic C. difficile  Final  Studies: Ct Abdomen Pelvis Wo Contrast  12/02/2014   CLINICAL DATA:  Nausea for 2 weeks. Elevated bilirubin level. Jaundice.  EXAM: CT ABDOMEN AND PELVIS WITHOUT CONTRAST  TECHNIQUE: Multidetector CT imaging of the abdomen and pelvis was performed following the standard protocol without IV contrast.  COMPARISON:  None.  FINDINGS: Lack of intravenous contrast severely limits the examination.  The liver is heterogeneous. Detail is obscured by a lack of intravenous contrast.  Gallbladder, spleen, pancreas, adrenal glands, and kidneys are within normal limits.  The uterus is lobulated worrisome for fibroids. Bladder is decompressed.  There is a small amount of free fluid scattered throughout the peritoneal space.  There is stranding throughout the peritoneal fat as well as the subcutaneous fat  worrisome for anasarca.  Bowel is decompressed.  There is no free intraperitoneal gas.  No vertebral compression deformity.  Tiny left pleural effusion.  IMPRESSION: The liver is diffusely heterogeneous which may be related to diffuse hepatic parenchymal disease or diffuse infiltration by tumor. Contrast enhanced CT or MRI of the liver is recommended.  Free-fluid  Diffuse edema.  Tiny left pleural effusion.  Uterine fibroids.   Electronically Signed   By: Marybelle Killings M.D.   On: 12/02/2014 18:03   Ct Abdomen Pelvis W Contrast  12/03/2014   CLINICAL DATA:  History of cirrhosis with elevated LFTs  EXAM: CT ABDOMEN AND PELVIS WITH CONTRAST  TECHNIQUE: Multidetector CT imaging of the abdomen and pelvis was performed using the standard protocol following bolus administration of intravenous contrast.  CONTRAST:  125mL OMNIPAQUE IOHEXOL 300 MG/ML  SOLN  COMPARISON:  12/02/2014  FINDINGS: Lung bases are free of acute infiltrate or sizable effusion.  The liver is again prominent but diffusely decreased in attenuation throughout all 3 phases of the contrast enhancement. This is most noted in the dome of the liver in the right lobe although no discrete mass lesion is noted. This likely represents a degree of fatty infiltration but also likely related to the underlying cirrhotic change.  The spleen, adrenal glands, pancreas and kidneys are within normal limits. The gallbladder is unremarkable. The appendix is well visualized and within normal limits.  Mild ascites is seen.  The bladder is decompressed. The uterus is prominent with fibroid change. No ovarian mass lesion is noted. No vascular abnormality is seen. No inflammatory changes of the bowel are seen. Mild anasarca is noted within the subcutaneous soft tissues.  IMPRESSION: Changes of mild ascites and anasarca similar to that seen on the prior exam.  Diffuse irregular decreased attenuation of the liver as described. This likely represents a combination of fatty liver  and underlying cirrhotic change. No discrete mass lesion is noted. If clinically indicated a contrast enhanced MRI of the liver would be best for evaluation of small lesions. This would be best performed as an outpatient when the patient's condition improves.  The remainder of the exam is stable from the prior study.   Electronically Signed   By: Inez Catalina M.D.   On: 12/03/2014 18:45    Scheduled Meds: . sodium chloride   Intravenous Once  . folic acid  1 mg Oral Daily  . hydrochlorothiazide  25 mg Oral Daily  . lisinopril  20 mg Oral Daily  . LORazepam  0-4 mg Intravenous Q6H   Followed by  . [START ON 12/05/2014] LORazepam  0-4 mg Intravenous Q12H  . multivitamin with minerals  1 tablet Oral Daily  . pantoprazole  40 mg Oral QAC breakfast  .  thiamine  100 mg Oral Daily   Or  . thiamine  100 mg Intravenous Daily   Continuous Infusions: . sodium chloride 75 mL/hr at 12/04/14 1235    Active Problems:   Absolute anemia   AP (abdominal pain)   Jaundice   Essential hypertension   Alcohol abuse   Abdominal pain   Nausea with vomiting   Diarrhea   Electrolyte abnormality   Cirrhosis (HCC)   Elevated bilirubin   Nausea    Time spent: 25 minutes. Greater than 50% of this time was spent in direct contact with the patient coordinating care.    Lelon Frohlich  [facility] Pager [phone]  If 7PM-7AM, please contact night-coverage at [email or web address], password [email or web address] 12/04/2014, 3:12 PM  LOS: 2 days

## 2014-12-04 NOTE — Progress Notes (Signed)
Subjective: Feeling ok today, having increased fatigue. Denies abdominal pain, N/V, overt GI bleeding.  Objective: Vital signs in last 24 hours: Temp:  [98.8 F (37.1 C)-99.4 F (37.4 C)] 99.4 F (37.4 C) (10/07 0622) Pulse Rate:  [114-116] 114 (10/07 0622) Resp:  [19-20] 19 (10/07 0622) BP: (101-124)/(64-75) 101/64 mmHg (10/07 0622) SpO2:  [99 %-100 %] 100 % (10/07 0622) Last BM Date: 12/03/14 General:   Alert and oriented, pleasant Head:  Normocephalic and atraumatic. Eyes:  Scleral icterus noted.  Heart:  S1, S2 present, no murmurs noted.  Lungs: Clear to auscultation bilaterally, without wheezing, rales, or rhonchi.  Abdomen:  Bowel sounds present, soft, non-tender, mildly distended. No tense ascites noted.  No rebound or guarding. No masses appreciated  Neurologic:  Alert and  oriented x4;  grossly normal neurologically. Skin:  Warm and dry, intact without significant lesions. Generalized jaundice noted. Psych:  Alert and cooperative. Normal mood and affect.  Intake/Output from previous day: 10/06 0701 - 10/07 0700 In: 1353.8 [P.O.:480; I.V.:873.8] Out: -  Intake/Output this shift:    Lab Results:  Recent Labs  12/02/14 1153 12/03/14 0634 12/04/14 0640  WBC 8.7 9.6 9.0  HGB 7.6* 7.4* 6.6*  HCT 24.5* 23.7* 21.0*  PLT 121* 117* 132*   BMET  Recent Labs  12/02/14 1153 12/03/14 0634 12/04/14 0640  NA 133* 131* 130*  K 3.3* 3.1* 3.5  CL 96* 96* 98*  CO2 27 27 27   GLUCOSE 103* 96 110*  BUN 6 6 7   CREATININE 0.52 0.50 0.57  CALCIUM 7.4* 7.4* 7.1*   LFT  Recent Labs  12/02/14 1153 12/03/14 0634 12/04/14 0640  PROT 8.2* 8.1 7.0  ALBUMIN 1.9* 1.8* 1.6*  AST 165* 156* 154*  ALT 46 45 46  ALKPHOS 120 108 94  BILITOT 10.4* 10.7* 8.7*   PT/INR  Recent Labs  12/03/14 0634  LABPROT 23.4*  INR 2.10*   Hepatitis Panel  Recent Labs  12/03/14 0634  HEPBSAG Negative  HCVAB 1.0*  HEPAIGM Negative  HEPBIGM Negative     Studies/Results: Ct Abdomen Pelvis Wo Contrast  12/02/2014   CLINICAL DATA:  Nausea for 2 weeks. Elevated bilirubin level. Jaundice.  EXAM: CT ABDOMEN AND PELVIS WITHOUT CONTRAST  TECHNIQUE: Multidetector CT imaging of the abdomen and pelvis was performed following the standard protocol without IV contrast.  COMPARISON:  None.  FINDINGS: Lack of intravenous contrast severely limits the examination.  The liver is heterogeneous. Detail is obscured by a lack of intravenous contrast.  Gallbladder, spleen, pancreas, adrenal glands, and kidneys are within normal limits.  The uterus is lobulated worrisome for fibroids. Bladder is decompressed.  There is a small amount of free fluid scattered throughout the peritoneal space.  There is stranding throughout the peritoneal fat as well as the subcutaneous fat worrisome for anasarca.  Bowel is decompressed.  There is no free intraperitoneal gas.  No vertebral compression deformity.  Tiny left pleural effusion.  IMPRESSION: The liver is diffusely heterogeneous which may be related to diffuse hepatic parenchymal disease or diffuse infiltration by tumor. Contrast enhanced CT or MRI of the liver is recommended.  Free-fluid  Diffuse edema.  Tiny left pleural effusion.  Uterine fibroids.   Electronically Signed   By: Marybelle Killings M.D.   On: 12/02/2014 18:03   Ct Abdomen Pelvis W Contrast  12/03/2014   CLINICAL DATA:  History of cirrhosis with elevated LFTs  EXAM: CT ABDOMEN AND PELVIS WITH CONTRAST  TECHNIQUE: Multidetector CT imaging of  the abdomen and pelvis was performed using the standard protocol following bolus administration of intravenous contrast.  CONTRAST:  166m OMNIPAQUE IOHEXOL 300 MG/ML  SOLN  COMPARISON:  12/02/2014  FINDINGS: Lung bases are free of acute infiltrate or sizable effusion.  The liver is again prominent but diffusely decreased in attenuation throughout all 3 phases of the contrast enhancement. This is most noted in the dome of the liver in the  right lobe although no discrete mass lesion is noted. This likely represents a degree of fatty infiltration but also likely related to the underlying cirrhotic change.  The spleen, adrenal glands, pancreas and kidneys are within normal limits. The gallbladder is unremarkable. The appendix is well visualized and within normal limits.  Mild ascites is seen.  The bladder is decompressed. The uterus is prominent with fibroid change. No ovarian mass lesion is noted. No vascular abnormality is seen. No inflammatory changes of the bowel are seen. Mild anasarca is noted within the subcutaneous soft tissues.  IMPRESSION: Changes of mild ascites and anasarca similar to that seen on the prior exam.  Diffuse irregular decreased attenuation of the liver as described. This likely represents a combination of fatty liver and underlying cirrhotic change. No discrete mass lesion is noted. If clinically indicated a contrast enhanced MRI of the liver would be best for evaluation of small lesions. This would be best performed as an outpatient when the patient's condition improves.  The remainder of the exam is stable from the prior study.   Electronically Signed   By: MInez CatalinaM.D.   On: 12/03/2014 18:45   Dg Abd Acute W/chest  12/02/2014   CLINICAL DATA:  Generalized abdominal pain for several weeks.  EXAM: DG ABDOMEN ACUTE W/ 1V CHEST  COMPARISON:  Jul 08, 2014.  FINDINGS: There is no evidence of dilated bowel loops or free intraperitoneal air. No radiopaque calculi or other significant radiographic abnormality is seen. Heart size and mediastinal contours are within normal limits. Both lungs are clear.  IMPRESSION: No evidence of bowel obstruction or ileus. No acute cardiopulmonary disease.   Electronically Signed   By: JMarijo Conception M.D.   On: 12/02/2014 13:54    Assessment: 44y/o female with h/o etoh abuse who presents with jaundice, upper abdominal pain, abdominal distention/anasarca, N/V, diarrhea (blood in stool).  In ER, noted to have deep jaundice, significant anemia, electrolyte abnormalities. CT WITHOUT contrast due to lack of IV access at the time showed diffusely heterogeneous liver likely diffuse hepatic parenchymal disease but infiltration by tumor not excluded.  Suspected etoh hepatitis in setting of underlying chronic liver disease. DF over 60 at time of admission. Patient has had a positive HCV Ab and will need to continue to hold prednisolone for now until RNA quant with reflex to genotype can be done to confirm HCV status. If false positive, can re-consider prednisolone.   Normocytic anemia in setting of chronic etoh abuse, menorrhagia, rectal bleeding/?melena. Suspect mixed anemia (IDA/anemia chronic disease). No signs of melena, during admission. No menstrual loss in two months but heavy before. Fibroids again noted on contrast CT.  Follow-up contrast CT shows changes of mild ascites, liver consistent with combination of fatty liver and underlying cirrhotic change without discrete mass lesion, no vascular abnormalities, mild ascites, uterus prominent with fibroid change, no bowel inflammatory changes.   Her H/H has decreased today to 6.6/21.0 (from 7.4/23.7), patient states she started her menstrual cycle yesterday but no large amount of blood loss ntoed. No overt GI  bleed noted. Likely some component of rehydration effect. Orders seen in system for PRBC transfusion. BUn/Cr remail notmal this morning, AST stable at 154, ALT and Alk Phos remain normal. Bili improved today to 8.7 from 10.7 yesterday.  Overall etiology likely multifactorial with fatty liver/possible chronic HCV underlying liver disease with acute alcoholic hepatitis with increased recent ETOH consumption. Not currently a candidate for prednisolone until HCV status sorted out.    Plan: 1. HCV RNA reflex to genotype to confirm HCV+ Ab 2. Continue supportive measures 3. [place] for overt GI bleed. 4. Recheck INR in a day or two  (already ordered) 5. Transfuse as necessary 6. Consider outpatint evaluation for candidacy for HCV treatment if ultimately HCV+ 7. Will need colonoscopy as outpatient and likely EGD for variceal screening given evidence of cirrhosis on contrast CT.    Walden Field, AGNP-C Adult & Gerontological Nurse Practitioner [facility]     LOS: 2 days    12/04/2014, 8:38 AM

## 2014-12-04 NOTE — Plan of Care (Addendum)
Lab contacted and indicated blood for pt not ready yet (may have antibodies). RN will continue to follow up. Lab called RN to inform they did find antibodies during type/screen. Request being sent to [facility] to obtain blood. Lab stated they would contact RN once further information or progress is made. Pt informed of delay and educated.

## 2014-12-04 NOTE — Care Management Note (Signed)
Case Management Note  Patient Details  Name: Whitney Bush MRN: [id number] Date of Birth: 23-Nov-1970  Subjective/Objective:                    Action/Plan:   Expected Discharge Date:                  Expected Discharge Plan:  Home/Self Care  In-House Referral:  Financial Counselor  Discharge planning Services  CM Consult, Follow-up appt scheduled, Medication Assistance, [place] Program  Post Acute Care Choice:    Choice offered to:  Patient  DME Arranged:    DME Agency:     HH Arranged:    [place] Agency:     Status of Service:  Completed, signed off  Medicare Important Message Given:    Date Medicare IM Given:    Medicare IM give by:    Date Additional Medicare IM Given:    Additional Medicare Important Message give by:     If discussed at [place] of Stay Meetings, dates discussed:    Additional Comments: Anticipate discharge over the weekend. Pt given MATCH voucher to assist with medications at discharge. Pt has follow up appt scheduled. No further CM needs noted. Christinia Gully [place], RN 12/04/2014, 2:39 PM

## 2014-12-05 DIAGNOSIS — K7030 Alcoholic cirrhosis of liver without ascites: Secondary | ICD-10-CM

## 2014-12-05 LAB — COMPREHENSIVE METABOLIC PANEL
ALT: 46 U/L (ref 14–54)
ANION GAP: 4 — AB (ref 5–15)
AST: 159 U/L — AB (ref 15–41)
Albumin: 1.6 g/dL — ABNORMAL LOW (ref 3.5–5.0)
Alkaline Phosphatase: 95 U/L (ref 38–126)
BUN: 10 mg/dL (ref 6–20)
CHLORIDE: 99 mmol/L — AB (ref 101–111)
CO2: 28 mmol/L (ref 22–32)
Calcium: 7.1 mg/dL — ABNORMAL LOW (ref 8.9–10.3)
Creatinine, Ser: 0.72 mg/dL (ref 0.44–1.00)
GFR calc Af Amer: >60 mL/min (ref >60)
GFR calc non Af Amer: >60 mL/min (ref >60)
GLUCOSE: 96 mg/dL (ref 65–99)
POTASSIUM: 3.6 mmol/L (ref 3.5–5.1)
SODIUM: 131 mmol/L — AB (ref 135–145)
TOTAL PROTEIN: 7.4 g/dL (ref 6.5–8.1)
Total Bilirubin: 10 mg/dL — ABNORMAL HIGH (ref 0.3–1.2)

## 2014-12-05 LAB — CBC
HEMATOCRIT: 27.5 % — AB (ref 36.0–46.0)
HEMOGLOBIN: 9 g/dL — AB (ref 12.0–15.0)
MCH: 28.8 pg (ref 26.0–34.0)
MCHC: 32.7 g/dL (ref 30.0–36.0)
MCV: 87.9 fL (ref 78.0–100.0)
Platelets: 121 10*3/uL — ABNORMAL LOW (ref 150–400)
RBC: 3.13 MIL/uL — ABNORMAL LOW (ref 3.87–5.11)
RDW: 23.8 % — ABNORMAL HIGH (ref 11.5–15.5)
WBC: 9.1 10*3/uL (ref 4.0–10.5)

## 2014-12-05 LAB — PROTIME-INR
INR: 1.95 — AB (ref 0.00–1.49)
PROTHROMBIN TIME: 22.1 s — AB (ref 11.6–15.2)

## 2014-12-05 MED ORDER — THIAMINE HCL 100 MG PO TABS: 100.0000 mg | ORAL_TABLET | Freq: Every day | ORAL | Status: DC

## 2014-12-05 MED ORDER — FOLIC ACID 1 MG PO TABS: 1.0000 mg | ORAL_TABLET | Freq: Every day | ORAL | Status: DC

## 2014-12-05 MED ORDER — ADULT MULTIVITAMIN W/MINERALS CH: 1.0000 | ORAL_TABLET | Freq: Every day | ORAL | Status: DC

## 2014-12-05 MED ORDER — PANTOPRAZOLE SODIUM 40 MG PO TBEC: 40.0000 mg | DELAYED_RELEASE_TABLET | Freq: Every day | ORAL | Status: DC

## 2014-12-05 NOTE — Progress Notes (Signed)
Patient discharged with instructions, prescription, match voucher, and care notes.  Verbalized understanding via teach back.  IV was removed and the site was WNL. Patient voiced no further complaints or concerns at the time of discharge.  Appointments scheduled per instructions.  Patient left the floor via w/c with staff and family in stable condition.

## 2014-12-05 NOTE — Progress Notes (Signed)
Patient states she feels better today. She is eating. She wants to go home. INR improved to 1.95 after vitamin K. HCV PCR remains pending.  Bilirubin 10  Vital signs in last 24 hours: Temp:  [98.4 F (36.9 C)-99 F (37.2 C)] 98.5 F (36.9 C) (10/08 0941) Pulse Rate:  [45-109] 103 (10/08 0941) Resp:  [16-22] 18 (10/08 0941) BP: (100-117)/(54-74) 117/64 mmHg (10/08 0941) SpO2:  [93 %-100 %] 98 % (10/08 0941) Last BM Date: 12/04/14 General:   Alert,  Well-developed, well-nourished, pleasant and cooperative in NAD  Intake/Output from previous day: 10/07 0701 - 10/08 0700 In: 1565 [P.O.:480; I.V.:250; Blood:835] Out: -  Intake/Output this shift: Total I/O In: 240 [P.O.:240] Out: -   Lab Results:  Recent Labs  12/03/14 0634 12/04/14 0640 12/05/14 0615  WBC 9.6 9.0 9.1  HGB 7.4* 6.6* 9.0*  HCT 23.7* 21.0* 27.5*  PLT 117* 132* 121*   BMET  Recent Labs  12/03/14 0634 12/04/14 0640 12/05/14 0615  NA 131* 130* 131*  K 3.1* 3.5 3.6  CL 96* 98* 99*  CO2 27 27 28   GLUCOSE 96 110* 96  BUN 6 7 10   CREATININE 0.50 0.57 0.72  CALCIUM 7.4* 7.1* 7.1*   LFT  Recent Labs  12/05/14 0615  PROT 7.4  ALBUMIN 1.6*  AST 159*  ALT 46  ALKPHOS 95  BILITOT 10.0*   PT/INR  Recent Labs  12/03/14 0634 12/05/14 0615  LABPROT 23.4* 22.1*  INR 2.10* 1.95*   Hepatitis Panel  Recent Labs  12/03/14 0634  HEPBSAG Negative  HCVAB 1.0*  HEPAIGM Negative  HEPBIGM Negative   C-Diff  Recent Labs  12/03/14 0650  CDIFFTOX NEGATIVE    Studies/Results: Ct Abdomen Pelvis W Contrast  12/03/2014   CLINICAL DATA:  History of cirrhosis with elevated LFTs  EXAM: CT ABDOMEN AND PELVIS WITH CONTRAST  TECHNIQUE: Multidetector CT imaging of the abdomen and pelvis was performed using the standard protocol following bolus administration of intravenous contrast.  CONTRAST:  168mL OMNIPAQUE IOHEXOL 300 MG/ML  SOLN  COMPARISON:  12/02/2014  FINDINGS: Lung bases are free of acute  infiltrate or sizable effusion.  The liver is again prominent but diffusely decreased in attenuation throughout all 3 phases of the contrast enhancement. This is most noted in the dome of the liver in the right lobe although no discrete mass lesion is noted. This likely represents a degree of fatty infiltration but also likely related to the underlying cirrhotic change.  The spleen, adrenal glands, pancreas and kidneys are within normal limits. The gallbladder is unremarkable. The appendix is well visualized and within normal limits.  Mild ascites is seen.  The bladder is decompressed. The uterus is prominent with fibroid change. No ovarian mass lesion is noted. No vascular abnormality is seen. No inflammatory changes of the bowel are seen. Mild anasarca is noted within the subcutaneous soft tissues.  IMPRESSION: Changes of mild ascites and anasarca similar to that seen on the prior exam.  Diffuse irregular decreased attenuation of the liver as described. This likely represents a combination of fatty liver and underlying cirrhotic change. No discrete mass lesion is noted. If clinically indicated a contrast enhanced MRI of the liver would be best for evaluation of small lesions. This would be best performed as an outpatient when the patient's condition improves.  The remainder of the exam is stable from the prior study.   Electronically Signed   By: Inez Catalina M.D.   On: 12/03/2014 18:45  Assessment: Active Problems:   Absolute anemia   AP (abdominal pain)   Jaundice   Essential hypertension   Alcohol abuse   Abdominal pain   Nausea with vomiting   Diarrhea   Electrolyte abnormality   Cirrhosis (HCC)   Elevated bilirubin   Nausea   Impression:  Likely EtOH hepatitis major hepatic issue at this point in time. Improvement in INR reassuring even though slight bump in bilirubin. It may take several days to a couple of weeks for bilirubin to settle down. Positive hepatitis C antibody - chronic  disease unknown as PCR remains pending.  Recommendations:  As discussed with Dr. Jerilee Hoh, would not be unreasonable to let the patient go home today with close interval follow-up by [place] next week. If her PCR is positive,  will hold off on prednisolone, if negative, will start treatment next week. We'll also get her back in the office in next 1-2 weeks to further evaluate her current GI issues.  No more alcohol

## 2014-12-05 NOTE — Progress Notes (Signed)
2nd unit of blood tolerated well.  Pt requested pain med once.  Sign. other at bedside.

## 2014-12-05 NOTE — Discharge Summary (Addendum)
Physician Discharge Summary  [name] FWY:[id number] DOB: Jan 19, 1971 DOA: 12/02/2014  PCP: No PCP Per Patient  Admit date: 12/02/2014 Discharge date: 12/05/2014  Time spent: 45 minutes  Recommendations for Outpatient Follow-up:  -Will be discharged home today. -Will follow up with Dr. Gala Romney next week to follow on Hep C status and decision on whether to initiate prednisolone. -Advised on alcohol cessation.   Discharge Diagnoses:  Active Problems:   Absolute anemia   AP (abdominal pain)   Jaundice   Essential hypertension   Alcohol abuse   Abdominal pain   Nausea with vomiting   Diarrhea   Electrolyte abnormality   Cirrhosis (HCC)   Elevated bilirubin   Nausea   Discharge Condition: Stable  Filed Weights   12/02/14 1125 12/02/14 2042  Weight: 108.863 kg (240 lb) 109.997 kg (242 lb 8 oz)    History of present illness:  As per Dr. Anastasio Champion 10/5: Whitney Bush is a 44 y.o. female  This is a 44 year old lady who gives approximately 2-3 week history of abdominal pain which is been getting worse. It is generalized but appears to be more in the upper abdomen. It has been associated with vomiting but no hematemesis. She also has had diarrhea and she thinks that there has been blood in the stool and sometimes black stools. She drinks alcohol almost on a daily basis, at least 2-3 vodkas every time. Her husband died in 02-12-14 and she says she has been drinking more since this time. She also has menorrhagia. Evaluation in the emergency room found her to be significantly anemic and also to have jaundice with elevated bilirubin levels. She is now being admitted for further investigation especially in view of abnormal CT scan findings with an abnormal liver.  Hospital Course:   CIrrhosis -HCV Ab +. HCV RNA ordered. 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 -Despite elevated discriminant factor, steroids contraindicated until HCV status sorted  out. Will follow up with GI.  ETOH Abuse -Thiamine/folate. -No signs of withdrawal while in the hospital.  Normocytic Anemia, Anemia of Chronic Disease -Likely related to menorrhagia +/- rectal bleeding. -Received 2 units of PRBCs for a Hb of 6.6, with Hb increase to 9.0.  Procedures:  None   Consultations:  GI  Discharge Instructions  Discharge Instructions    Diet - low sodium heart healthy    Complete by:  As directed      Increase activity slowly    Complete by:  As directed             Medication List    STOP taking these medications        acetaminophen 500 MG tablet  Commonly known as:  TYLENOL     aspirin-sod bicarb-citric acid 325 MG Tbef tablet  Commonly known as:  ALKA-SELTZER     ibuprofen 200 MG tablet  Commonly known as:  ADVIL,MOTRIN      TAKE these medications        folic acid 1 MG tablet  Commonly known as:  FOLVITE  Take 1 tablet (1 mg total) by mouth daily.     lisinopril-hydrochlorothiazide 20-25 MG tablet  Commonly known as:  PRINZIDE,ZESTORETIC  Take 1 tablet by mouth daily.     multivitamin with minerals Tabs tablet  Take 1 tablet by mouth daily.     pantoprazole 40 MG tablet  Commonly known as:  PROTONIX  Take 1 tablet (40 mg total) by mouth daily  before breakfast.     thiamine 100 MG tablet  Take 1 tablet (100 mg total) by mouth daily.       No Known Allergies     Follow-up Information    Follow up with Alphia Kava.   Contact information:   [place] [address] [phone]       Follow up On 12/08/2014.   Why:  at 11;30      Follow up with Manus Rudd, MD.   Specialty:  Gastroenterology   Why:  office will call with appointment   Contact information:   [address] [place] [address] [phone]        The results of significant diagnostics from this hospitalization (including imaging, microbiology, ancillary and laboratory) are listed below for reference.     Significant Diagnostic Studies: Ct Abdomen Pelvis Wo Contrast  12/02/2014   CLINICAL DATA:  Nausea for 2 weeks. Elevated bilirubin level. Jaundice.  EXAM: CT ABDOMEN AND PELVIS WITHOUT CONTRAST  TECHNIQUE: Multidetector CT imaging of the abdomen and pelvis was performed following the standard protocol without IV contrast.  COMPARISON:  None.  FINDINGS: Lack of intravenous contrast severely limits the examination.  The liver is heterogeneous. Detail is obscured by a lack of intravenous contrast.  Gallbladder, spleen, pancreas, adrenal glands, and kidneys are within normal limits.  The uterus is lobulated worrisome for fibroids. Bladder is decompressed.  There is a small amount of free fluid scattered throughout the peritoneal space.  There is stranding throughout the peritoneal fat as well as the subcutaneous fat worrisome for anasarca.  Bowel is decompressed.  There is no free intraperitoneal gas.  No vertebral compression deformity.  Tiny left pleural effusion.  IMPRESSION: The liver is diffusely heterogeneous which may be related to diffuse hepatic parenchymal disease or diffuse infiltration by tumor. Contrast enhanced CT or MRI of the liver is recommended.  Free-fluid  Diffuse edema.  Tiny left pleural effusion.  Uterine fibroids.   Electronically Signed   By: Marybelle Killings M.D.   On: 12/02/2014 18:03   Ct Abdomen Pelvis W Contrast  12/03/2014   CLINICAL DATA:  History of cirrhosis with elevated LFTs  EXAM: CT ABDOMEN AND PELVIS WITH CONTRAST  TECHNIQUE: Multidetector CT imaging of the abdomen and pelvis was performed using the standard protocol following bolus administration of intravenous contrast.  CONTRAST:  147mL OMNIPAQUE IOHEXOL 300 MG/ML  SOLN  COMPARISON:  12/02/2014  FINDINGS: Lung bases are free of acute infiltrate or sizable effusion.  The liver is again prominent but diffusely decreased in attenuation throughout all 3 phases of the contrast enhancement. This is most noted in the dome of the  liver in the right lobe although no discrete mass lesion is noted. This likely represents a degree of fatty infiltration but also likely related to the underlying cirrhotic change.  The spleen, adrenal glands, pancreas and kidneys are within normal limits. The gallbladder is unremarkable. The appendix is well visualized and within normal limits.  Mild ascites is seen.  The bladder is decompressed. The uterus is prominent with fibroid change. No ovarian mass lesion is noted. No vascular abnormality is seen. No inflammatory changes of the bowel are seen. Mild anasarca is noted within the subcutaneous soft tissues.  IMPRESSION: Changes of mild ascites and anasarca similar to that seen on the prior exam.  Diffuse irregular decreased attenuation of the liver as described. This likely represents a combination of fatty liver and underlying cirrhotic change. No discrete  mass lesion is noted. If clinically indicated a contrast enhanced MRI of the liver would be best for evaluation of small lesions. This would be best performed as an outpatient when the patient's condition improves.  The remainder of the exam is stable from the prior study.   Electronically Signed   By: Inez Catalina M.D.   On: 12/03/2014 18:45   Dg Abd Acute W/chest  12/02/2014   CLINICAL DATA:  Generalized abdominal pain for several weeks.  EXAM: DG ABDOMEN ACUTE W/ 1V CHEST  COMPARISON:  Jul 08, 2014.  FINDINGS: There is no evidence of dilated bowel loops or free intraperitoneal air. No radiopaque calculi or other significant radiographic abnormality is seen. Heart size and mediastinal contours are within normal limits. Both lungs are clear.  IMPRESSION: No evidence of bowel obstruction or ileus. No acute cardiopulmonary disease.   Electronically Signed   By: Marijo Conception, M.D.   On: 12/02/2014 13:54    Microbiology: Recent Results (from the past 240 hour(s))  C difficile quick scan w PCR reflex     Status: None   Collection Time: 12/03/14  6:50  AM  Result Value Ref Range Status   C Diff antigen NEGATIVE NEGATIVE Final   C Diff toxin NEGATIVE NEGATIVE Final   C Diff interpretation Negative for toxigenic C. difficile  Final     Labs: Basic Metabolic Panel:  Recent Labs Lab 12/02/14 1153 12/03/14 0634 12/04/14 0640 12/05/14 0615  NA 133* 131* 130* 131*  K 3.3* 3.1* 3.5 3.6  CL 96* 96* 98* 99*  CO2 27 27 27 28   GLUCOSE 103* 96 110* 96  BUN 6 6 7 10   CREATININE 0.52 0.50 0.57 0.72  CALCIUM 7.4* 7.4* 7.1* 7.1*   Liver Function Tests:  Recent Labs Lab 12/02/14 1153 12/03/14 0634 12/04/14 0640 12/05/14 0615  AST 165* 156* 154* 159*  ALT 46 45 46 46  ALKPHOS 120 108 94 95  BILITOT 10.4* 10.7* 8.7* 10.0*  PROT 8.2* 8.1 7.0 7.4  ALBUMIN 1.9* 1.8* 1.6* 1.6*    Recent Labs Lab 12/02/14 1153  LIPASE 44  AMYLASE 24*    Recent Labs Lab 12/02/14 1405  AMMONIA 30   CBC:  Recent Labs Lab 12/02/14 1153 12/03/14 0634 12/04/14 0640 12/05/14 0615  WBC 8.7 9.6 9.0 9.1  NEUTROABS 6.1  --   --   --   HGB 7.6* 7.4* 6.6* 9.0*  HCT 24.5* 23.7* 21.0* 27.5*  MCV 86.6 88.1 88.6 87.9  PLT 121* 117* 132* 121*   Cardiac Enzymes: No results for input(s): CKTOTAL, CKMB, CKMBINDEX, TROPONINI in the last 168 hours. BNP: BNP (last 3 results)  Recent Labs  07/08/14 0912  BNP 22.0    ProBNP (last 3 results) No results for input(s): PROBNP in the last 8760 hours.  CBG: No results for input(s): GLUCAP in the last 168 hours.     SignedLelon Frohlich  [facility] Pager: [phone] 12/05/2014, 4:29 PM

## 2014-12-05 NOTE — Progress Notes (Signed)
Pt complaining about feet, said they are throbbing from being swollen. Dr. Jerilee Hoh paged around 9:50am.

## 2014-12-06 LAB — TYPE AND SCREEN
ABO/RH(D): A POS
ANTIBODY SCREEN: NEGATIVE
DAT, IgG: NEGATIVE
UNIT DIVISION: 0
Unit division: 0

## 2014-12-08 LAB — HCV RNA QUANT RFLX ULTRA OR GENOTYP
HCV RNA QNT(LOG COPY/ML): UNDETERMINED {Log_IU}/mL
HEPATITIS C QUANTITATION: NOT DETECTED [IU]/mL

## 2014-12-10 ENCOUNTER — Encounter (HOSPITAL_COMMUNITY): Payer: Self-pay | Admitting: *Deleted

## 2014-12-10 ENCOUNTER — Other Ambulatory Visit: Payer: Self-pay | Admitting: Nurse Practitioner

## 2014-12-10 ENCOUNTER — Emergency Department (HOSPITAL_COMMUNITY): Payer: Self-pay

## 2014-12-10 ENCOUNTER — Inpatient Hospital Stay (HOSPITAL_COMMUNITY)
Admission: EM | Admit: 2014-12-10 | Discharge: 2014-12-21 | DRG: 683 | Disposition: A | Discharge status: Home / Self Care | Payer: Self-pay | Attending: Internal Medicine | Admitting: Internal Medicine

## 2014-12-10 DIAGNOSIS — Z8049 Family history of malignant neoplasm of other genital organs: Secondary | ICD-10-CM

## 2014-12-10 DIAGNOSIS — K7011 Alcoholic hepatitis with ascites: Secondary | ICD-10-CM | POA: Insufficient documentation

## 2014-12-10 DIAGNOSIS — N170 Acute kidney failure with tubular necrosis: Principal | ICD-10-CM | POA: Diagnosis present

## 2014-12-10 DIAGNOSIS — R3911 Hesitancy of micturition: Secondary | ICD-10-CM | POA: Diagnosis present

## 2014-12-10 DIAGNOSIS — R17 Unspecified jaundice: Secondary | ICD-10-CM | POA: Diagnosis present

## 2014-12-10 DIAGNOSIS — E8770 Fluid overload, unspecified: Secondary | ICD-10-CM

## 2014-12-10 DIAGNOSIS — K922 Gastrointestinal hemorrhage, unspecified: Secondary | ICD-10-CM

## 2014-12-10 DIAGNOSIS — F1021 Alcohol dependence, in remission: Secondary | ICD-10-CM | POA: Diagnosis present

## 2014-12-10 DIAGNOSIS — D649 Anemia, unspecified: Secondary | ICD-10-CM | POA: Diagnosis present

## 2014-12-10 DIAGNOSIS — R188 Other ascites: Secondary | ICD-10-CM | POA: Insufficient documentation

## 2014-12-10 DIAGNOSIS — Z833 Family history of diabetes mellitus: Secondary | ICD-10-CM

## 2014-12-10 DIAGNOSIS — R1900 Intra-abdominal and pelvic swelling, mass and lump, unspecified site: Secondary | ICD-10-CM

## 2014-12-10 DIAGNOSIS — R109 Unspecified abdominal pain: Secondary | ICD-10-CM

## 2014-12-10 DIAGNOSIS — Z801 Family history of malignant neoplasm of trachea, bronchus and lung: Secondary | ICD-10-CM

## 2014-12-10 DIAGNOSIS — K7031 Alcoholic cirrhosis of liver with ascites: Secondary | ICD-10-CM | POA: Diagnosis present

## 2014-12-10 DIAGNOSIS — D696 Thrombocytopenia, unspecified: Secondary | ICD-10-CM | POA: Diagnosis present

## 2014-12-10 DIAGNOSIS — N289 Disorder of kidney and ureter, unspecified: Secondary | ICD-10-CM

## 2014-12-10 DIAGNOSIS — N179 Acute kidney failure, unspecified: Secondary | ICD-10-CM | POA: Diagnosis present

## 2014-12-10 DIAGNOSIS — E871 Hypo-osmolality and hyponatremia: Secondary | ICD-10-CM | POA: Diagnosis present

## 2014-12-10 DIAGNOSIS — K7030 Alcoholic cirrhosis of liver without ascites: Secondary | ICD-10-CM | POA: Diagnosis present

## 2014-12-10 DIAGNOSIS — I10 Essential (primary) hypertension: Secondary | ICD-10-CM | POA: Diagnosis present

## 2014-12-10 DIAGNOSIS — Z87891 Personal history of nicotine dependence: Secondary | ICD-10-CM

## 2014-12-10 LAB — COMPREHENSIVE METABOLIC PANEL
ALBUMIN: 1.6 g/dL — AB (ref 3.5–5.0)
ALT: 47 U/L (ref 14–54)
AST: 176 U/L — ABNORMAL HIGH (ref 15–41)
Alkaline Phosphatase: 109 U/L (ref 38–126)
Anion gap: 8 (ref 5–15)
BILIRUBIN TOTAL: 9.6 mg/dL — AB (ref 0.3–1.2)
BUN: 20 mg/dL (ref 6–20)
CHLORIDE: 98 mmol/L — AB (ref 101–111)
CO2: 24 mmol/L (ref 22–32)
CREATININE: 1.82 mg/dL — AB (ref 0.44–1.00)
Calcium: 7.8 mg/dL — ABNORMAL LOW (ref 8.9–10.3)
GFR, EST AFRICAN AMERICAN: 38 mL/min — AB (ref >60)
GFR, EST NON AFRICAN AMERICAN: 33 mL/min — AB (ref >60)
Glucose, Bld: 95 mg/dL (ref 65–99)
POTASSIUM: 4.3 mmol/L (ref 3.5–5.1)
SODIUM: 130 mmol/L — AB (ref 135–145)
TOTAL PROTEIN: 7.7 g/dL (ref 6.5–8.1)

## 2014-12-10 LAB — CBC WITH DIFFERENTIAL/PLATELET
BASOS PCT: 1 %
Basophils Absolute: 0.1 10*3/uL (ref 0.0–0.1)
EOS ABS: 0.1 10*3/uL (ref 0.0–0.7)
Eosinophils Relative: 1 %
HCT: 28.8 % — ABNORMAL LOW (ref 36.0–46.0)
Hemoglobin: 9.3 g/dL — ABNORMAL LOW (ref 12.0–15.0)
Lymphocytes Relative: 17 %
Lymphs Abs: 1.8 10*3/uL (ref 0.7–4.0)
MCH: 29 pg (ref 26.0–34.0)
MCHC: 32.3 g/dL (ref 30.0–36.0)
MCV: 89.7 fL (ref 78.0–100.0)
Monocytes Absolute: 1.1 10*3/uL — ABNORMAL HIGH (ref 0.1–1.0)
Monocytes Relative: 11 %
NEUTROS ABS: 7.2 10*3/uL (ref 1.7–7.7)
NEUTROS PCT: 71 %
PLATELETS: 136 10*3/uL — AB (ref 150–400)
RBC: 3.21 MIL/uL — AB (ref 3.87–5.11)
RDW: 24.2 % — ABNORMAL HIGH (ref 11.5–15.5)
WBC: 10.2 10*3/uL (ref 4.0–10.5)

## 2014-12-10 LAB — OCCULT BLOOD X 1 CARD TO LAB, STOOL: FECAL OCCULT BLD: POSITIVE — AB

## 2014-12-10 LAB — POC OCCULT BLOOD, ED: Fecal Occult Bld: NEGATIVE

## 2014-12-10 LAB — PROTIME-INR
INR: 1.91 — ABNORMAL HIGH (ref 0.00–1.49)
PROTHROMBIN TIME: 21.8 s — AB (ref 11.6–15.2)

## 2014-12-10 LAB — LIPASE, BLOOD: LIPASE: 58 U/L — AB (ref 22–51)

## 2014-12-10 MED ORDER — ADULT MULTIVITAMIN W/MINERALS CH
1.0000 | ORAL_TABLET | Freq: Every day | ORAL | Status: DC
  Administered 2014-12-11 – 2014-12-21 (×11): 1 via ORAL
  Filled 2014-12-10 (×11): qty 1

## 2014-12-10 MED ORDER — SODIUM CHLORIDE 0.9 % IV SOLN: INTRAVENOUS | Status: DC

## 2014-12-10 MED ORDER — FUROSEMIDE 10 MG/ML IJ SOLN
20.0000 mg | Freq: Once | INTRAMUSCULAR | Status: AC
  Administered 2014-12-10: 20 mg via INTRAVENOUS
  Filled 2014-12-10: qty 2

## 2014-12-10 MED ORDER — SODIUM CHLORIDE 0.9 % IJ SOLN
3.0000 mL | Freq: Two times a day (BID) | INTRAMUSCULAR | Status: DC
  Administered 2014-12-10 – 2014-12-20 (×19): 3 mL via INTRAVENOUS

## 2014-12-10 MED ORDER — FOLIC ACID 1 MG PO TABS
1.0000 mg | ORAL_TABLET | Freq: Every day | ORAL | Status: DC
  Administered 2014-12-11 – 2014-12-21 (×11): 1 mg via ORAL
  Filled 2014-12-10 (×11): qty 1

## 2014-12-10 MED ORDER — VITAMIN B-1 100 MG PO TABS
100.0000 mg | ORAL_TABLET | Freq: Every day | ORAL | Status: DC
  Administered 2014-12-11 – 2014-12-21 (×11): 100 mg via ORAL
  Filled 2014-12-10 (×11): qty 1

## 2014-12-10 MED ORDER — SODIUM CHLORIDE 0.9 % IV SOLN
250.0000 mL | INTRAVENOUS | Status: DC | PRN
  Administered 2014-12-11: 250 mL via INTRAVENOUS

## 2014-12-10 MED ORDER — PREDNISOLONE SODIUM PHOSPHATE 10 MG PO TBDP
40.0000 mg | ORAL_TABLET | Freq: Every day | ORAL | Status: DC
  Filled 2014-12-10: qty 4

## 2014-12-10 MED ORDER — PREDNISOLONE SODIUM PHOSPHATE 10 MG PO TBDP: ORAL_TABLET | ORAL | Status: DC

## 2014-12-10 MED ORDER — SODIUM CHLORIDE 0.9 % IJ SOLN: 3.0000 mL | INTRAMUSCULAR | Status: DC | PRN

## 2014-12-10 MED ORDER — PANTOPRAZOLE SODIUM 40 MG PO TBEC
40.0000 mg | DELAYED_RELEASE_TABLET | Freq: Every day | ORAL | Status: DC
  Administered 2014-12-11 – 2014-12-21 (×11): 40 mg via ORAL
  Filled 2014-12-10 (×11): qty 1

## 2014-12-10 NOTE — ED Notes (Signed)
Patient has swelling noted in bilateral lower extremities. States swelling started after discharge from hospital "a couple of days ago." Patient also has yellow sclera to bilateral eyes. Patient complaining of abdominal distention.

## 2014-12-10 NOTE — H&P (Addendum)
History and Physical  Whitney Bush:[id number] DOB: May 15, 1970 DOA: 12/10/2014  Referring physician: Dr. Rogene Houston in ED PCP: Alphia Kava   Chief Complaint: leg swelling.  HPI:  44 year old woman PMH recently diagnosed cirrhosis secondary to alcohol abuse who presented to the emergency department with increasing lower extremity edema. Initial evaluation suggested acute kidney injury and volume overload she was referred for admission.  Patient was discharged October 8 is being treated for cirrhosis and seen by gastroenterology. Prednisolone was considered but deferred while waiting for results of hepatitis C studies. She was also noted to have anemia and was transfused blood. Plans were made for outpatient follow-up with gastroenterology.  The patient reports compliance with her usual antihypertensive and HCTZ however she no significant weight gain and fluid retention especially in her legs now extending to her abdomens. This is the primary reason she came to the hospital. She has had a small amount of blood when she has a bowel movement. She also has had difficulty urinating and emptying her bladder. This seems to have come on in the last week or so. She denies any alcohol use for the last 2 weeks.  In the emergency department afebrile, vital signs stable, no hypoxia. Pertinent labs: Creatinine up to 1.82. Sodium stable 1:30. No change in bilirubin, 9.6. Platelet count stable 136. Hemoglobin 9.3. EKG: Independently reviewed. Sinus rhythm, no acute changes. Imaging: Chest x-ray no acute disease. Ultrasound showed moderate ascites within the pelvis.  Review of Systems:  Negative for fever, visual changes, sore throat, rash, new muscle aches, chest pain, SOB,   n/v/abdominal pain.  she reports some mild blood with bowel movement, she reports urinary habits and since he  Past Medical History  Diagnosis Date  . Hypertension   . Depression   . Migraines   .  Peripheral edema   . Alcohol abuse     Past Surgical History  Procedure Laterality Date  . Abcess drainage    . Cesarean section      Social History:  reports that she quit smoking about 3 years ago. Her smoking use included Cigarettes. She has a 5 pack-year smoking history. She has quit using smokeless tobacco. Her smokeless tobacco use included Snuff. She reports that she drinks about 2.4 oz of alcohol per week. She reports that she does not use illicit drugs.   No Known Allergies  Family History  Problem Relation Age of Onset  . Diabetes Father   . Cancer Other   . Cervical cancer Maternal Grandmother   . Lung cancer Maternal Grandfather   . Alcohol abuse Other     multiple family members  . Colon cancer Neg Hx   . Liver disease Neg Hx      Prior to Admission medications   Medication Sig Start Date End Date Taking? Authorizing Provider  folic acid (FOLVITE) 1 MG tablet Take 1 tablet (1 mg total) by mouth daily. 12/05/14  Yes Erline Hau, MD  lisinopril-hydrochlorothiazide (PRINZIDE,ZESTORETIC) 20-25 MG per tablet Take 1 tablet by mouth daily. 07/08/14  Yes Milton Ferguson, MD  Multiple Vitamin (MULTIVITAMIN WITH MINERALS) TABS tablet Take 1 tablet by mouth daily. 12/05/14  Yes Estela Leonie Green, MD  naproxen sodium (ANAPROX) 220 MG tablet Take 440 mg by mouth 2 (two) times daily with a meal.   Yes Historical Provider, MD  pantoprazole (PROTONIX) 40 MG tablet Take 1 tablet (40 mg total) by mouth daily before breakfast. 12/05/14  Yes Koleen Nimrod  Deniece Ree, MD  thiamine 100 MG tablet Take 1 tablet (100 mg total) by mouth daily. 12/05/14  Yes Erline Hau, MD  prednisoLONE (ORAPRED ODT) 10 MG disintegrating tablet Take 40 mg a day for 28 days. Then take 30 mg a day for 5 days, then 20 mg a day for 5 days, then 20 mg a day for 5 days. Then stop. 12/10/14   Carlis Stable, NP   Physical Exam: Filed Vitals:   12/10/14 1415 12/10/14 1426 12/10/14 1451  12/10/14 1615  BP:   128/68   Pulse: 109 107 109   Temp:   98.5 F (36.9 C)   TempSrc:   Oral   Resp:   18   Height:   5\' 2"  (1.575 m)   Weight:   120.702 kg (266 lb 1.6 oz) 120.067 kg (264 lb 11.2 oz)  SpO2: 100% 100% 100%     General:  Appears calm and comfortable sitting on the side of the  Eyes: PERRL, normal lids, irises  ENT: grossly normal hearing, lips & tongue Neck: no LAD, masses or thyromegaly Cardiovascular: RRR, no m/r/g.  3+ bilateral lower extremity edema Respiratory: CTA bilaterally, no w/r/r. Normal respiratory effort. Abdomen: soft, ntnd Skin: no rash or induration  Noted Musculoskeletal: grossly normal tone BUE/BLE Psychiatric: grossly normal mood and affect, speech fluent and appropriate Neurologic: grossly non-focal.  Wt Readings from Last 3 Encounters:  12/10/14 120.067 kg (264 lb 11.2 oz)  12/02/14 109.997 kg (242 lb 8 oz)  07/08/14 117.935 kg (260 lb)    Labs on Admission:  Basic Metabolic Panel:  Recent Labs Lab 12/04/14 0640 12/05/14 0615 12/10/14 0956  NA 130* 131* 130*  K 3.5 3.6 4.3  CL 98* 99* 98*  CO2 27 28 24   GLUCOSE 110* 96 95  BUN 7 10 20   CREATININE 0.57 0.72 1.82*  CALCIUM 7.1* 7.1* 7.8*    Liver Function Tests:  Recent Labs Lab 12/04/14 0640 12/05/14 0615 12/10/14 0956  AST 154* 159* 176*  ALT 46 46 47  ALKPHOS 94 95 109  BILITOT 8.7* 10.0* 9.6*  PROT 7.0 7.4 7.7  ALBUMIN 1.6* 1.6* 1.6*    Recent Labs Lab 12/10/14 0956  LIPASE 58*   CBC:  Recent Labs Lab 12/04/14 0640 12/05/14 0615 12/10/14 0956  WBC 9.0 9.1 10.2  NEUTROABS  --   --  7.2  HGB 6.6* 9.0* 9.3*  HCT 21.0* 27.5* 28.8*  MCV 88.6 87.9 89.7  PLT 132* 121* 136*    Radiological Exams on Admission: US Abdomen Limited  12/10/2014  CLINICAL DATA:  Abdominal swelling EXAM: LIMITED ABDOMEN ULTRASOUND FOR ASCITES TECHNIQUE: Limited ultrasound survey for ascites was performed in all four abdominal quadrants. COMPARISON:  CT 12/03/2014 FINDINGS:  Minimal fluid in the RIGHT upper quadrant and LEFT upper quadrant. Moderate volume ascites within the pelvis midline. IMPRESSION: Moderate volume of ascites within the pelvis. Electronically Signed   By: Suzy Bouchard M.D.   On: 12/10/2014 13:02   Dg Chest Port 1 View  12/10/2014  CLINICAL DATA:  Shortness of breath EXAM: PORTABLE CHEST 1 VIEW COMPARISON:  12/02/2014 FINDINGS: Low volume chest without pneumonia or edema. No effusion or air leak. Normal heart size and aortic contours. IMPRESSION: Negative low volume portable chest. Electronically Signed   By: Monte Fantasia M.D.   On: 12/10/2014 10:16      Principal Problem:   AKI (acute kidney injury) ([place]) Active Problems:   Jaundice   Cirrhosis ([place])  Elevated bilirubin   Volume overload   Urinary hesitancy   Assessment/Plan 1. AKI. Reports compliance with medications and normal oral intake. Etiology unclear. Concern for hepatorenal syndrome. She does report urinary hesitancy however BUN is normal and pattern does not strongly suggest postobstructive phenomenon. 2. Massive volume overload, up 12 pounds since October 5. Suspect cirrhosis, doubt CHF. Diuresis may be challenging given her renal function. No pulmonary compromise at this point. 3. Alcoholic cirrhosis, decompensated, with ascites, hyponatremia, anemia, thrombocytopenia and elevated LFTs. No significant change in LFTs from last admission. 4. Positive hepatitis C antibody, negative viral load 5. Urinary hesitancy  6.  alcohol dependence in remission 2 weeks   Appears stable.  Plan admit, will consult GI and nephrology to assist with cirrhosis care and diuresis.  Strict I/O, hold lisinopril/HCTZ  Check echo  Check bladder scan, place Foley if has urinary hesitancy.  Full code DVT prophylaxis:SCDs Family Communication: none Disposition Plan/Anticipated LOS: admit 2 days  Time spent: 60 minutes  Murray Hodgkins, MD  [facility] Pager  [phone] 12/10/2014, 5:32 PM

## 2014-12-10 NOTE — ED Provider Notes (Signed)
CSN: [id number]     Arrival date & time 12/10/14  0840 History  By signing my name below, I, Stephania Fragmin, attest that this documentation has been prepared under the direction and in the presence of Fredia Sorrow, MD. Electronically Signed: Stephania Fragmin, ED Scribe. 12/10/2014. 9:50 AM.     Chief Complaint  Patient presents with  . Leg Swelling   The history is provided by the patient. No language interpreter was used.   HPI Comments: Whitney Bush is a 44 y.o. 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 She also notes 4 episodes of increased hematochezia which acutely worsened this morning, a subjective fever and chills that began 1 week ago, visual changes which she characterizes as seeing "black spots" upon waking, sore throat, occasional SOB with standing up and walking, dizziness, lightheadedness, abdominal pain, nausea, decreased urine that was present while she was in the hospital, frequent bowel movements that usually occur after eating, and a weight gain over 20 lbs over the past several days. Patient was admitted to the hospital with elevated bilirubin and anemia due to hematochezia; she reports she was transfused 2 units of blood at the hospital, which alleviated her symptoms at the time. She denies having an endoscopy while in the hospital, and the etiology of her hematochezia was not determined. She states before her hospitalization she did not have a history of hematochezia. Patient is not currently on any anticoagulation, but she does note a history of bleeding easily. She denies any known history of ascites or paracentesis. She denies having a PCP. Patient had seen Dr. Gala Romney in the hospital. She denies cough, nasal congestion, chest pain, vomiting, dysuria, back pain, rash, or  headache.  Past Medical History  Diagnosis Date  . Hypertension   . Depression   . Migraines   . Peripheral edema   . Alcohol abuse    Past Surgical History  Procedure Laterality Date  . Abcess drainage    . Cesarean section     Family History  Problem Relation Age of Onset  . Diabetes Father   . Cancer Other   . Cervical cancer Maternal Grandmother   . Lung cancer Maternal Grandfather   . Alcohol abuse Other     multiple family members  . Colon cancer Neg Hx   . Liver disease Neg Hx    Social History  Substance Use Topics  . Smoking status: Former Smoker -- 1.00 packs/day for 5 years    Types: Cigarettes    Quit date: 12/29/2010  . Smokeless tobacco: Former Systems developer    Types: Snuff  . Alcohol Use: 2.4 oz/week    4 Glasses of wine per week     Comment: most days. several drinks of Vodka. heavy for four years and worse over past 10 months (12/03/14) Last drink was 2 weeks ago, per pt. (12/10/2014)   OB History    Gravida Para Term Preterm AB TAB SAB Ectopic Multiple Living   2 1 1  1     1      Review of Systems  Constitutional: Positive for fever, chills and unexpected weight change.  HENT: Negative for congestion, rhinorrhea and sore throat.   Eyes: Positive for visual disturbance.  Respiratory: Positive for shortness of breath. Negative for cough.   Cardiovascular: Positive for leg swelling. Negative  for chest pain.  Gastrointestinal: Positive for nausea, abdominal pain, abdominal distention and anal bleeding. Negative for vomiting.  Genitourinary: Positive for decreased urine volume. Negative for dysuria.  Musculoskeletal: Negative for myalgias and back pain.  Skin: Negative for rash.  Neurological: Positive for dizziness and light-headedness. Negative for headaches.  Hematological: Bruises/bleeds easily.      Allergies  Review of patient's allergies indicates no known allergies.  Home Medications   Prior to Admission medications   Medication Sig Start  Date End Date Taking? Authorizing Provider  folic acid (FOLVITE) 1 MG tablet Take 1 tablet (1 mg total) by mouth daily. 12/05/14  Yes Erline Hau, MD  lisinopril-hydrochlorothiazide (PRINZIDE,ZESTORETIC) 20-25 MG per tablet Take 1 tablet by mouth daily. 07/08/14  Yes Milton Ferguson, MD  Multiple Vitamin (MULTIVITAMIN WITH MINERALS) TABS tablet Take 1 tablet by mouth daily. 12/05/14  Yes Estela Leonie Green, MD  naproxen sodium (ANAPROX) 220 MG tablet Take 440 mg by mouth 2 (two) times daily with a meal.   Yes Historical Provider, MD  pantoprazole (PROTONIX) 40 MG tablet Take 1 tablet (40 mg total) by mouth daily before breakfast. 12/05/14  Yes Erline Hau, MD  thiamine 100 MG tablet Take 1 tablet (100 mg total) by mouth daily. 12/05/14  Yes Erline Hau, MD   BP 107/65 mmHg  Pulse 100  Temp(Src) 97.5 F (36.4 C) (Oral)  Resp 23  Ht 5\' 2"  (1.575 m)  Wt 265 lb (120.203 kg)  BMI 48.46 kg/m2  SpO2 100%  LMP 09/03/2014 Physical Exam  Constitutional: She is oriented to person, place, and time. She appears well-developed and well-nourished. No distress.  HENT:  Head: Normocephalic and atraumatic.  Mouth/Throat: Oropharynx is clear and moist.  Moist mucous membranes.  Eyes: Conjunctivae and EOM are normal. Pupils are equal, round, and reactive to light. Scleral icterus is present.  Scleral icterus. Pupils look normal.  Neck: Neck supple. No tracheal deviation present.  Cardiovascular: Regular rhythm and normal heart sounds.  Tachycardia present.   Pulmonary/Chest: Effort normal and breath sounds normal. No respiratory distress. She has no wheezes. She has no rales.  Lungs are clear to auscultation.  Abdominal: Soft. She exhibits distension. Bowel sounds are decreased. There is no tenderness.  Genitourinary:  Rectal exam stool Archam[place]au no gross blood. Some external skin tags. No fissure no prolapsed internal hemorrhoids. No thrombosed external  hemorrhoids.  Stool guaiac pending.  Musculoskeletal: Normal range of motion. She exhibits no edema.  Marked edema to BLE that is very heavy below the knees, and present above. Cap refill to great toes is 1 second.  Neurological: She is alert and oriented to person, place, and time.  Skin: Skin is warm and dry.  Psychiatric: She has a normal mood and affect. Her behavior is normal.  Nursing note and vitals reviewed.   ED Course  Procedures (including critical care time)  DIAGNOSTIC STUDIES: Oxygen Saturation is 100% on RA, normal by my interpretation.    COORDINATION OF CARE: 9:19 AM - Discussed treatment plan with pt at bedside which includes labwork. Pt verbalized understanding and agreed to plan.   Labs Review Labs Reviewed  CBC WITH DIFFERENTIAL/PLATELET - Abnormal; Notable for the following:    RBC 3.21 (*)    Hemoglobin 9.3 (*)    HCT 28.8 (*)    RDW 24.2 (*)    Platelets 136 (*)    Monocytes Absolute 1.1 (*)    All other components within normal limits  COMPREHENSIVE METABOLIC PANEL - Abnormal; Notable for the following:    Sodium 130 (*)    Chloride 98 (*)    Creatinine, Ser 1.82 (*)    Calcium 7.8 (*)    Albumin 1.6 (*)    AST 176 (*)    Total Bilirubin 9.6 (*)    GFR calc non Af Amer 33 (*)    GFR calc Af Amer 38 (*)    All other components within normal limits  LIPASE, BLOOD - Abnormal; Notable for the following:    Lipase 58 (*)    All other components within normal limits  PROTIME-INR - Abnormal; Notable for the following:    Prothrombin Time 21.8 (*)    INR 1.91 (*)    All other components within normal limits  POC OCCULT BLOOD, ED   MDM   Final diagnoses:  Renal insufficiency  Gastrointestinal hemorrhage, unspecified gastritis, unspecified gastrointestinal hemorrhage type    Patient just discharged a couple days ago from hospitalization for GI bleed. Patient known to have cirrhosis hepatitis C. Patient returns here with marked swelling to both  legs consistent with edema including thighs. Also ultrasound confirms moderate amount of ascites. Labs without any significant change other than marked change and creatinine. BUN unchanged. Patient states she's continued to have red blood per rectum did sustain water all red. Stool though is grossly nonbloody.  Hospitalist team will admit for the ascites change in renal function and the persistent GI bleed. Patient is continued with some baseline tachycardia heart rate around the 100 and blood pressures have been dropping a little bit most recently down to 93 but more consistently around 749 systolic.  I, Lamonica Trueba, personally performed the services described in this documentation. All medical record entries made by the scribe were at my direction and in my presence.  I have reviewed the chart and discharge instructions and agree that the record reflects my personal performance and is accurate and complete. Deante Blough.  12/10/2014. 1:32 PM.       Fredia Sorrow, MD 12/10/14 1335

## 2014-12-10 NOTE — ED Notes (Signed)
Pt states she was discharged from the hospital a couple of days ago and states edema to BLE began the same night after getting home. Swelling from the hips down. Pt also states bright red blood to stools remains. Pt required transfusion with her last admission. PT states unable to bend knees due to swelling.

## 2014-12-11 ENCOUNTER — Inpatient Hospital Stay (HOSPITAL_COMMUNITY): Payer: Self-pay

## 2014-12-11 DIAGNOSIS — K922 Gastrointestinal hemorrhage, unspecified: Secondary | ICD-10-CM

## 2014-12-11 DIAGNOSIS — N179 Acute kidney failure, unspecified: Secondary | ICD-10-CM

## 2014-12-11 DIAGNOSIS — E8779 Other fluid overload: Secondary | ICD-10-CM

## 2014-12-11 DIAGNOSIS — K7460 Unspecified cirrhosis of liver: Secondary | ICD-10-CM

## 2014-12-11 DIAGNOSIS — R1900 Intra-abdominal and pelvic swelling, mass and lump, unspecified site: Secondary | ICD-10-CM

## 2014-12-11 DIAGNOSIS — R609 Edema, unspecified: Secondary | ICD-10-CM

## 2014-12-11 DIAGNOSIS — R109 Unspecified abdominal pain: Secondary | ICD-10-CM

## 2014-12-11 DIAGNOSIS — R188 Other ascites: Secondary | ICD-10-CM | POA: Insufficient documentation

## 2014-12-11 DIAGNOSIS — R17 Unspecified jaundice: Secondary | ICD-10-CM

## 2014-12-11 LAB — COMPREHENSIVE METABOLIC PANEL
ALK PHOS: 96 U/L (ref 38–126)
ALT: 42 U/L (ref 14–54)
AST: 144 U/L — AB (ref 15–41)
Albumin: 1.4 g/dL — ABNORMAL LOW (ref 3.5–5.0)
Anion gap: 6 (ref 5–15)
BILIRUBIN TOTAL: 8.8 mg/dL — AB (ref 0.3–1.2)
BUN: 22 mg/dL — AB (ref 6–20)
CALCIUM: 7.6 mg/dL — AB (ref 8.9–10.3)
CO2: 26 mmol/L (ref 22–32)
CREATININE: 1.74 mg/dL — AB (ref 0.44–1.00)
Chloride: 99 mmol/L — ABNORMAL LOW (ref 101–111)
GFR, EST AFRICAN AMERICAN: 41 mL/min — AB (ref >60)
GFR, EST NON AFRICAN AMERICAN: 35 mL/min — AB (ref >60)
Glucose, Bld: 97 mg/dL (ref 65–99)
Potassium: 4.1 mmol/L (ref 3.5–5.1)
Sodium: 131 mmol/L — ABNORMAL LOW (ref 135–145)
Total Protein: 6.9 g/dL (ref 6.5–8.1)

## 2014-12-11 LAB — CBC
HCT: 26.1 % — ABNORMAL LOW (ref 36.0–46.0)
Hemoglobin: 8.4 g/dL — ABNORMAL LOW (ref 12.0–15.0)
MCH: 28.7 pg (ref 26.0–34.0)
MCHC: 32.2 g/dL (ref 30.0–36.0)
MCV: 89.1 fL (ref 78.0–100.0)
PLATELETS: 125 10*3/uL — AB (ref 150–400)
RBC: 2.93 MIL/uL — AB (ref 3.87–5.11)
RDW: 23.7 % — AB (ref 11.5–15.5)
WBC: 10.3 10*3/uL (ref 4.0–10.5)

## 2014-12-11 LAB — URINALYSIS, ROUTINE W REFLEX MICROSCOPIC
GLUCOSE, UA: NEGATIVE mg/dL
Hgb urine dipstick: NEGATIVE
KETONES UR: NEGATIVE mg/dL
LEUKOCYTES UA: NEGATIVE
NITRITE: NEGATIVE
PH: 5.5 (ref 5.0–8.0)
PROTEIN: NEGATIVE mg/dL
Specific Gravity, Urine: 1.01 (ref 1.005–1.030)
Urobilinogen, UA: 0.2 mg/dL (ref 0.0–1.0)

## 2014-12-11 MED ORDER — SPIRONOLACTONE 25 MG PO TABS
25.0000 mg | ORAL_TABLET | Freq: Every day | ORAL | Status: DC
  Administered 2014-12-11: 25 mg via ORAL
  Filled 2014-12-11: qty 1

## 2014-12-11 MED ORDER — ALBUMIN HUMAN 25 % IV SOLN
25.0000 g | Freq: Two times a day (BID) | INTRAVENOUS | Status: DC
Start: 2014-12-11 — End: 2014-12-17
  Administered 2014-12-11 – 2014-12-16 (×11): 25 g via INTRAVENOUS
  Filled 2014-12-11 (×13): qty 100

## 2014-12-11 MED ORDER — PREDNISOLONE 15 MG/5ML PO SOLN
40.0000 mg | Freq: Every day | ORAL | Status: DC
  Administered 2014-12-11 – 2014-12-21 (×11): 40 mg via ORAL
  Filled 2014-12-11 (×13): qty 15

## 2014-12-11 MED ORDER — FUROSEMIDE 10 MG/ML IJ SOLN
40.0000 mg | Freq: Two times a day (BID) | INTRAMUSCULAR | Status: DC
  Administered 2014-12-11 (×2): 40 mg via INTRAVENOUS
  Filled 2014-12-11 (×2): qty 4

## 2014-12-11 NOTE — Progress Notes (Signed)
PROGRESS NOTE  Whitney Bush DDU:[id number] DOB: [birth year] DOA: 12/10/2014 PCP: [facility]  Summary: 44 yo female with history of cirrhosis secondary to alcohol abuse and hypertension presented to the ED with leg swelling. Patient was recently discharged 10/8 after being treated with cirrhosis and was transfused blood. While in the ED, she was found to have elevated creatinine of 1.82, Hgb at 9.3, t. Bili 9.6 and stable sodium at 130. Her CXR was unremarkable, however,abdominal u/s revealed ascites within the pelvis. Patient was admitted for further management.   Assessment/Plan: 1. AKI. Somewhat improved. Favor ATN although consider hepatorenal syndrome. Reports compliance with medications and normal oral intake. She did report urinary hesitancy however this has spontaneously resolved. BUN is normal and pattern does not strongly suggest postobstructive phenomenon. 2. Massive volume overload secondary to cirrhosis, up 12 pounds since October 5. Plan diuresis per nephrology. No pulmonary compromise at this point. 3. Alcoholic cirrhosis, decompensated, with ascites, hyponatremia, anemia, thrombocytopenia and elevated LFTs. No significant change in LFTs from last admission.  4. Positive hepatitis C antibody, negative viral load. 5. Urinary hesitancy  6. Alcohol dependence in remission 2 weeks    Overall somewhat improved.  Plan diuresis per nephology and prednisolone per GI recommendations.  Full code: Full DVT prophylaxis:SCDs Family discussion: Discussed plan in detail. No further concerns at this time. Disposition Plan/Anticipated LOS: admit 2 days  Murray Hodgkins, MD  [facility]  Pager [phone] If 7PM-7AM, please contact night-coverage at [email or web address], password [facility] 12/11/2014, 7:43 AM  LOS: 1 day   Consultants:  GI  Nephrology  Procedures:  ECHO  Antibiotics:    HPI/Subjective: Feeling better. Reports no change in leg swelling.  Urinating without difficulty. Tolerating food without nausea or vomiting. No SOB.  Objective: Filed Vitals:   12/10/14 1451 12/10/14 1615 12/10/14 2036 12/11/14 0543  BP: 128/68  120/73 120/68  Pulse: 109  108 103  Temp: 98.5 F (36.9 C)  98.7 F (37.1 C) 98.5 F (36.9 C)  TempSrc: Oral  Oral Oral  Resp: 18  20 20   Height: 5\' 2"  (1.575 m)     Weight: 120.702 kg (266 lb 1.6 oz) 120.067 kg (264 lb 11.2 oz)    SpO2: 100%  100% 100%    Intake/Output Summary (Last 24 hours) at 12/11/14 0743 Last data filed at 12/10/14 2037  Gross per 24 hour  Intake    960 ml  Output    100 ml  Net    860 ml     Filed Weights   12/10/14 0855 12/10/14 1451 12/10/14 1615  Weight: 120.203 kg (265 lb) 120.702 kg (266 lb 1.6 oz) 120.067 kg (264 lb 11.2 oz)    Exam:     Afebrile, VSS, not hypoxic General:  Appears calm and comfortable. Sitting in chair. Cardiovascular: RRR, no m/r/g. 3+ BLE edema. Respiratory: CTA bilaterally, no w/r/r. Normal respiratory effort. Abdomen: soft, ntnd Psychiatric: grossly normal mood and affect, speech fluent and appropriate  New data reviewed:  Sodium 131, Creatinine improved at1.74, BUN 22  T. Bili 8.8. LFTs stable.  Hgb 8.4, platelets 125   Pertinent data since admission:  Creatinine 1.82, calcium 7.8  Hgb 9.3, platelets 136  CXR -Negative low volume portable chest.  Pending data:  ECHO  Scheduled Meds: . folic acid  1 mg Oral Daily  . multivitamin with minerals  1 tablet Oral Daily  . pantoprazole  40 mg Oral QAC breakfast  . prednisoLONE  40 mg Oral Daily  .  prednisoLONE  40 mg Oral QAC breakfast  . sodium chloride  3 mL Intravenous Q12H  . thiamine  100 mg Oral Daily   Continuous Infusions:   Principal Problem:   AKI (acute kidney injury) ([place]) Active Problems:   Jaundice   Cirrhosis ([place])   Elevated bilirubin   Volume overload   Urinary hesitancy   Ascites   Time spent 20 minutes   By signing my name below, I, Rhett Bannister attest that this documentation has been prepared under the direction and in the presence of Murray Hodgkins, MD   Electronically signed: Rhett Bannister  12/11/2014  11:35 AM   I personally performed the services described in this documentation. All medical record entries made by the scribe were at my direction. I have reviewed the chart and agree that the record reflects my personal performance and is accurate and complete. Murray Hodgkins, MD

## 2014-12-11 NOTE — Consult Note (Signed)
Reason for Consult: Acute kidney injury and anasarca Referring Physician: Dr. Gurney Maxin is an 44 y.o. female.  HPI: She is a patient who has history of hypertension, depression, or cirrhosis presently came with complaints of some nausea, vomiting, some diarrhea. Patient also complains of significant increase in her weight and leg swelling. The last couple of months patient has gained about 30 pounds. Since her last admission patient has gained about 20 pounds. She has some difficulty breathing but no orthopnea or paroxysmal nocturnal dyspnea. Patient denies any previous history of renal failure and no history of kidney stone. Presently she is feeling somewhat better. She states that she was not making that much amount of urine has an outpatient and also it wasn't dark urine.  Past Medical History  Diagnosis Date  . Hypertension   . Depression   . Migraines   . Peripheral edema   . Alcohol abuse     Past Surgical History  Procedure Laterality Date  . Abcess drainage    . Cesarean section      Family History  Problem Relation Age of Onset  . Diabetes Father   . Cancer Other   . Cervical cancer Maternal Grandmother   . Lung cancer Maternal Grandfather   . Alcohol abuse Other     multiple family members  . Colon cancer Neg Hx   . Liver disease Neg Hx     Social History:  reports that she quit smoking about 3 years ago. Her smoking use included Cigarettes. She has a 5 pack-year smoking history. She has quit using smokeless tobacco. Her smokeless tobacco use included Snuff. She reports that she drinks about 2.4 oz of alcohol per week. She reports that she does not use illicit drugs.  Allergies: No Known Allergies  Medications: I have reviewed the patient's current medications.  Results for orders placed or performed during the hospital encounter of 12/10/14 (from the past 48 hour(s))  CBC with Differential/Platelet     Status: Abnormal   Collection Time: 12/10/14   9:56 AM  Result Value Ref Range   WBC 10.2 4.0 - 10.5 K/uL   RBC 3.21 (L) 3.87 - 5.11 MIL/uL   Hemoglobin 9.3 (L) 12.0 - 15.0 g/dL   HCT 28.8 (L) 36.0 - 46.0 %   MCV 89.7 78.0 - 100.0 fL   MCH 29.0 26.0 - 34.0 pg   MCHC 32.3 30.0 - 36.0 g/dL   RDW 24.2 (H) 11.5 - 15.5 %   Platelets 136 (L) 150 - 400 K/uL   Neutrophils Relative % 71 %   Neutro Abs 7.2 1.7 - 7.7 K/uL   Lymphocytes Relative 17 %   Lymphs Abs 1.8 0.7 - 4.0 K/uL   Monocytes Relative 11 %   Monocytes Absolute 1.1 (H) 0.1 - 1.0 K/uL   Eosinophils Relative 1 %   Eosinophils Absolute 0.1 0.0 - 0.7 K/uL   Basophils Relative 1 %   Basophils Absolute 0.1 0.0 - 0.1 K/uL   RBC Morphology ROULEAUX   Comprehensive metabolic panel     Status: Abnormal   Collection Time: 12/10/14  9:56 AM  Result Value Ref Range   Sodium 130 (L) 135 - 145 mmol/L   Potassium 4.3 3.5 - 5.1 mmol/L   Chloride 98 (L) 101 - 111 mmol/L   CO2 24 22 - 32 mmol/L   Glucose, Bld 95 65 - 99 mg/dL   BUN 20 6 - 20 mg/dL   Creatinine, Ser 1.82 (H) 0.44 -  1.00 mg/dL   Calcium 7.8 (L) 8.9 - 10.3 mg/dL   Total Protein 7.7 6.5 - 8.1 g/dL   Albumin 1.6 (L) 3.5 - 5.0 g/dL   AST 176 (H) 15 - 41 U/L   ALT 47 14 - 54 U/L   Alkaline Phosphatase 109 38 - 126 U/L   Total Bilirubin 9.6 (H) 0.3 - 1.2 mg/dL   GFR calc non Af Amer 33 (L) >60 mL/min   GFR calc Af Amer 38 (L) >60 mL/min    Comment: (NOTE) The eGFR has been calculated using the CKD EPI equation. This calculation has not been validated in all clinical situations. eGFR's persistently <60 mL/min signify possible Chronic Kidney Disease.    Anion gap 8 5 - 15  Lipase, blood     Status: Abnormal   Collection Time: 12/10/14  9:56 AM  Result Value Ref Range   Lipase 58 (H) 22 - 51 U/L  Protime-INR     Status: Abnormal   Collection Time: 12/10/14  9:56 AM  Result Value Ref Range   Prothrombin Time 21.8 (H) 11.6 - 15.2 seconds   INR 1.91 (H) 0.00 - 1.49  POC occult blood, ED Provider will collect      Status: None   Collection Time: 12/10/14  2:36 PM  Result Value Ref Range   Fecal Occult Bld NEGATIVE NEGATIVE  Occult blood card to lab, stool     Status: Abnormal   Collection Time: 12/10/14  4:20 PM  Result Value Ref Range   Fecal Occult Bld POSITIVE (A) NEGATIVE  Comprehensive metabolic panel     Status: Abnormal   Collection Time: 12/11/14  6:13 AM  Result Value Ref Range   Sodium 131 (L) 135 - 145 mmol/L   Potassium 4.1 3.5 - 5.1 mmol/L   Chloride 99 (L) 101 - 111 mmol/L   CO2 26 22 - 32 mmol/L   Glucose, Bld 97 65 - 99 mg/dL   BUN 22 (H) 6 - 20 mg/dL   Creatinine, Ser 1.74 (H) 0.44 - 1.00 mg/dL   Calcium 7.6 (L) 8.9 - 10.3 mg/dL   Total Protein 6.9 6.5 - 8.1 g/dL   Albumin 1.4 (L) 3.5 - 5.0 g/dL   AST 144 (H) 15 - 41 U/L   ALT 42 14 - 54 U/L   Alkaline Phosphatase 96 38 - 126 U/L   Total Bilirubin 8.8 (H) 0.3 - 1.2 mg/dL   GFR calc non Af Amer 35 (L) >60 mL/min   GFR calc Af Amer 41 (L) >60 mL/min    Comment: (NOTE) The eGFR has been calculated using the CKD EPI equation. This calculation has not been validated in all clinical situations. eGFR's persistently <60 mL/min signify possible Chronic Kidney Disease.    Anion gap 6 5 - 15  CBC     Status: Abnormal   Collection Time: 12/11/14  6:13 AM  Result Value Ref Range   WBC 10.3 4.0 - 10.5 K/uL   RBC 2.93 (L) 3.87 - 5.11 MIL/uL   Hemoglobin 8.4 (L) 12.0 - 15.0 g/dL   HCT 26.1 (L) 36.0 - 46.0 %   MCV 89.1 78.0 - 100.0 fL   MCH 28.7 26.0 - 34.0 pg   MCHC 32.2 30.0 - 36.0 g/dL   RDW 23.7 (H) 11.5 - 15.5 %   Platelets 125 (L) 150 - 400 K/uL    US Abdomen Limited  12/10/2014  CLINICAL DATA:  Abdominal swelling EXAM: LIMITED ABDOMEN ULTRASOUND FOR ASCITES TECHNIQUE: Limited  ultrasound survey for ascites was performed in all four abdominal quadrants. COMPARISON:  CT 12/03/2014 FINDINGS: Minimal fluid in the RIGHT upper quadrant and LEFT upper quadrant. Moderate volume ascites within the pelvis midline. IMPRESSION:  Moderate volume of ascites within the pelvis. Electronically Signed   By: Suzy Bouchard M.D.   On: 12/10/2014 13:02   Dg Chest Port 1 View  12/10/2014  CLINICAL DATA:  Shortness of breath EXAM: PORTABLE CHEST 1 VIEW COMPARISON:  12/02/2014 FINDINGS: Low volume chest without pneumonia or edema. No effusion or air leak. Normal heart size and aortic contours. IMPRESSION: Negative low volume portable chest. Electronically Signed   By: Monte Fantasia M.D.   On: 12/10/2014 10:16    Review of Systems  Constitutional: Negative for fever and malaise/fatigue.  Respiratory: Positive for shortness of breath.   Cardiovascular: Positive for leg swelling. Negative for chest pain and orthopnea.  Gastrointestinal: Positive for nausea, vomiting, diarrhea and blood in stool.  Psychiatric/Behavioral: Positive for depression.   Blood pressure 120/68, pulse 103, temperature 98.5 F (36.9 C), temperature source Oral, resp. rate 20, height 5' 2"  (1.575 m), weight 264 lb 11.2 oz (120.067 kg), last menstrual period 09/03/2014, SpO2 100 %. Physical Exam  Constitutional: She is oriented to person, place, and time. No distress.  HENT:  Mouth/Throat: No oropharyngeal exudate.  Neck: No JVD present.  Cardiovascular: Normal rate and regular rhythm.   Respiratory: No respiratory distress. She has no wheezes.  GI: She exhibits distension. There is no tenderness. There is no rebound and no guarding.  Musculoskeletal: She exhibits edema.  Neurological: She is alert and oriented to person, place, and time.    Assessment/Plan: Problem #1 acute kidney injury: Most likely ATN versus pressure renal. At this moment hepatorenal cannot be ruled out. Patient's urine output is not documented. Her creatinine seems to be improving. Problem #2 significant anasarca Problem #3 history of liver cirrhosis: Thought to be secondary to alcohol abuse. Problem #4 history of hypertension: Her blood pressure is reasonably  controlled Problem #5 anemia: Possibly iron deficiency anemia Problem #6 hyponatremia: Hypervolemic hyponatremia Problem #7 history of depression Problem #8 history of migraine. Plan: We'll use albumin 25 g IV twice a day 2 days We'll use Lasix 40 mg IV twice a day Will add Aldactone 25 mg by mouth daily We'll check her basic metabolic panel in the morning.  Xiao Graul S 12/11/2014, 8:46 AM

## 2014-12-11 NOTE — Care Management Note (Signed)
Case Management Note  Patient Details  Name: Whitney Bush MRN: [id number] Date of Birth: May 21, 1970  Expected Discharge Date:  12/14/14               Expected Discharge Plan:  Home/Self Care  In-House Referral:  NA  Discharge planning Services  CM Consult  Post Acute Care Choice:  NA Choice offered to:  NA  DME Arranged:    DME Agency:     HH Arranged:    [facility] Agency:     Status of Service:  Completed, signed off  Medicare Important Message Given:    Date Medicare IM Given:    Medicare IM give by:    Date Additional Medicare IM Given:    Additional Medicare Important Message give by:     If discussed at [place] of Stay Meetings, dates discussed:    Additional Comments: Pt is from home and ind at baseline. Pt is uninsured. Pt was recently discharged and received MATCH voucher at that time. MD anticipates sending her on meds off $4 list at [facility] and pt should not require med assistance this admission. Pt also referred to [facility] clinic and was unable to see MD because she does not have ID. Pt says she will go get ID and go back to [facility] clinic. No other CM issues identified at this time.  Sherald Barge, RN 12/11/2014, 3:40 PM

## 2014-12-11 NOTE — Consult Note (Signed)
Referring Provider: No ref. provider found Primary Care Physician:  Alphia Kava Primary Gastroenterologist:  Dr. Gala Romney  Date of Admission: 12/10/14 Date of Consultation: 12/11/14  Reason for Consultation:  Cirrhosis  HPI:  44 year old female with a PMH of hypertension, ETOH abuse, cirrhosis, and peripheral edema recently admitted for ETOH-related cirrhosis and acute alcoholic hepatitis. Prednisolone for DF>32 was deferred at the time due to positive Hep C Ab, although in the past couple days RNA quant was negative (fale positive Ab test). However, before patient could be reached with results she presented tot he ER with increasing volume overload and bilateral LE edema, despite continuing her home diuretics. Per ER notes, also complains of small volume hematochezia and difficulty urinating. She was admitted for AKI, jaundice, cirrhosis, and volume overload.  Today she states she is having bilateral minimal abdominal pain in her sides "from where it's gotten hard from the fluid." Denies N/V. Has had some scant hematochezia "only when I wipe after going to the bathroom" and denies moderate to large volume hematochezia. Denies new onset confusion, worsening jaundice/yellowing of eyes, diarrhea. Has had some chills at home, but no objective fever. Denies any other upper or lower GI symptoms.  Past Medical History  Diagnosis Date  . Hypertension   . Depression   . Migraines   . Peripheral edema   . Alcohol abuse     Past Surgical History  Procedure Laterality Date  . Abcess drainage    . Cesarean section      Prior to Admission medications   Medication Sig Start Date End Date Taking? Authorizing Provider  folic acid (FOLVITE) 1 MG tablet Take 1 tablet (1 mg total) by mouth daily. 12/05/14  Yes Erline Hau, MD  lisinopril-hydrochlorothiazide (PRINZIDE,ZESTORETIC) 20-25 MG per tablet Take 1 tablet by mouth daily. 07/08/14  Yes Milton Ferguson, MD  Multiple  Vitamin (MULTIVITAMIN WITH MINERALS) TABS tablet Take 1 tablet by mouth daily. 12/05/14  Yes Estela Leonie Green, MD  naproxen sodium (ANAPROX) 220 MG tablet Take 440 mg by mouth 2 (two) times daily with a meal.   Yes Historical Provider, MD  pantoprazole (PROTONIX) 40 MG tablet Take 1 tablet (40 mg total) by mouth daily before breakfast. 12/05/14  Yes Erline Hau, MD  thiamine 100 MG tablet Take 1 tablet (100 mg total) by mouth daily. 12/05/14  Yes Erline Hau, MD  prednisoLONE (ORAPRED ODT) 10 MG disintegrating tablet Take 40 mg a day for 28 days. Then take 30 mg a day for 5 days, then 20 mg a day for 5 days, then 20 mg a day for 5 days. Then stop. 12/10/14   Carlis Stable, NP    Current Facility-Administered Medications  Medication Dose Route Frequency Provider Last Rate Last Dose  . 0.9 %  sodium chloride infusion  250 mL Intravenous PRN Samuella Cota, MD      . folic acid (FOLVITE) tablet 1 mg  1 mg Oral Daily Samuella Cota, MD      . multivitamin with minerals tablet 1 tablet  1 tablet Oral Daily Samuella Cota, MD      . pantoprazole (PROTONIX) EC tablet 40 mg  40 mg Oral QAC breakfast Samuella Cota, MD      . prednisoLONE (PRELONE) 15 MG/5ML SOLN 40 mg  40 mg Oral QAC breakfast Samuella Cota, MD      . sodium chloride 0.9 % injection 3 mL  3 mL Intravenous Q12H Samuella Cota, MD   3 mL at 12/10/14 2109  . sodium chloride 0.9 % injection 3 mL  3 mL Intravenous PRN Samuella Cota, MD      . thiamine (VITAMIN B-1) tablet 100 mg  100 mg Oral Daily Samuella Cota, MD        Allergies as of 12/10/2014  . (No Known Allergies)    Family History  Problem Relation Age of Onset  . Diabetes Father   . Cancer Other   . Cervical cancer Maternal Grandmother   . Lung cancer Maternal Grandfather   . Alcohol abuse Other     multiple family members  . Colon cancer Neg Hx   . Liver disease Neg Hx     Social History   Social History   . Marital Status: Single    Spouse Name: N/A  . Number of Children: 1  . Years of Education: N/A   Occupational History  . unemployed    Social History Main Topics  . Smoking status: Former Smoker -- 1.00 packs/day for 5 years    Types: Cigarettes    Quit date: 12/29/2010  . Smokeless tobacco: Former Systems developer    Types: Snuff  . Alcohol Use: 2.4 oz/week    4 Glasses of wine per week     Comment: most days. several drinks of Vodka. heavy for four years and worse over past 10 months (12/03/14) Last drink was 2 weeks ago, per pt. (12/10/2014)  . Drug Use: No  . Sexual Activity: Yes    Birth Control/ Protection: None   Other Topics Concern  . Not on file   Social History Narrative    Review of Systems: Gen: Denies fever, chills, loss of appetite. CV: Denies chest pain, heart palpitations, syncope  Resp: Denies shortness of breath with rest, cough, wheezing GI: See HPI.  GU : Difficulty voiding.  Derm: Denies rash, itching, dry skin Psych: Denies confusion, or memory loss Heme: Denies bruising, bleeding.  Physical Exam: Vital signs in last 24 hours: Temp:  [97.5 F (36.4 C)-98.7 F (37.1 C)] 98.5 F (36.9 C) (10/14 0543) Pulse Rate:  [94-109] 103 (10/14 0543) Resp:  [18-23] 20 (10/14 0543) BP: (96-128)/(61-76) 120/68 mmHg (10/14 0543) SpO2:  [100 %] 100 % (10/14 0543) Weight:  [264 lb 11.2 oz (120.067 kg)-266 lb 1.6 oz (120.702 kg)] 264 lb 11.2 oz (120.067 kg) (10/13 1615) Last BM Date: 12/10/14 General:   Alert,  Well-developed, well-nourished, pleasant and cooperative in NAD Head:  Normocephalic and atraumatic. Eyes:  Scleral icterus. Ears:  Normal auditory acuity. Lungs:  Clear throughout to auscultation.   No wheezes, crackles, or rhonchi. No acute distress. Heart:  Regular rate and rhythm; no murmurs, clicks, rubs,  or gallops. Abdomen:  Distended but no tense ascites, bilateral side minimal TTP. No masses noted. Normal bowel sounds, without guarding, and without  rebound.   Rectal:  Deferred.   Extremities:  Bilateral LE edema noted. Neurologic:  Alert and  oriented x4;  grossly normal neurologically. Skin:  Intact without significant lesions or rashes. Psych:  Alert and cooperative. Normal mood and affect.  Intake/Output from previous day: 10/13 0701 - 10/14 0700 In: 960 [P.O.:960] Out: 100 [Urine:100] Intake/Output this shift:    Lab Results:  Recent Labs  12/10/14 0956 12/11/14 0613  WBC 10.2 10.3  HGB 9.3* 8.4*  HCT 28.8* 26.1*  PLT 136* 125*   BMET  Recent Labs  12/10/14 0956 12/11/14 2694  NA 130* 131*  K 4.3 4.1  CL 98* 99*  CO2 24 26  GLUCOSE 95 97  BUN 20 22*  CREATININE 1.82* 1.74*  CALCIUM 7.8* 7.6*   LFT  Recent Labs  12/10/14 0956 12/11/14 0613  PROT 7.7 6.9  ALBUMIN 1.6* 1.4*  AST 176* 144*  ALT 47 42  ALKPHOS 109 96  BILITOT 9.6* 8.8*   PT/INR  Recent Labs  12/10/14 0956  LABPROT 21.8*  INR 1.91*   Hepatitis Panel No results for input(s): HEPBSAG, HCVAB, HEPAIGM, HEPBIGM in the last 72 hours. C-Diff No results for input(s): CDIFFTOX in the last 72 hours.  Studies/Results: US Abdomen Limited  12/10/2014  CLINICAL DATA:  Abdominal swelling EXAM: LIMITED ABDOMEN ULTRASOUND FOR ASCITES TECHNIQUE: Limited ultrasound survey for ascites was performed in all four abdominal quadrants. COMPARISON:  CT 12/03/2014 FINDINGS: Minimal fluid in the RIGHT upper quadrant and LEFT upper quadrant. Moderate volume ascites within the pelvis midline. IMPRESSION: Moderate volume of ascites within the pelvis. Electronically Signed   By: Suzy Bouchard M.D.   On: 12/10/2014 13:02   Dg Chest Port 1 View  12/10/2014  CLINICAL DATA:  Shortness of breath EXAM: PORTABLE CHEST 1 VIEW COMPARISON:  12/02/2014 FINDINGS: Low volume chest without pneumonia or edema. No effusion or air leak. Normal heart size and aortic contours. IMPRESSION: Negative low volume portable chest. Electronically Signed   By: Monte Fantasia  M.D.   On: 12/10/2014 10:16    Impression: 44 year old female with recently diagnosed cirrhosis, recent admission for alcoholic hepatitis now with volume overload and AKI for unknown etiology, being seen by Urology. Her liver funtion testing is stable from day of discharge with AST 176, bili 9.6 on admission (improved to AST 144 and bili 8.8 today). Alk phos and ALT normal. Platelets are low/stable at 125 and INR stable/improved at 1.91. Hemoccult cards positive 1/2 yesterday, Hgb low at 9.3 on admission and decreased to 8.4 today (9.0 at previous discharge.) Abdominal US shows minimal ascites in the abdomen and moderate ascites in the pelvis. Likely no need for paracentesis at this point, especially if adequate diuresis can be achieved.  Recently her HCV RNA quant came back negative and currently her DF is still elevated >32. Will start on prednisolone for acute alcoholic hepatitis from recent admission as this was called into her pharmacy before she was readmitted. Research shows prednisolone 30m daly for 28 days with a 2 week taper can decrease mortality in patients with acute alcoholic hepatitis. AFebrile inpatient, VSS.  MELD: 30 Child Pugh: Class C (11 points)  Plan: 1. Continue prednisolone 40 mg x 4 days then taper over 2 weeks (30 mg x 5 days, 20 mg x 5 days, 10 mg x 5 days, then stop); will need to be continued at discharge until course complete. 2. Continue to monitor hepatic function, recheck CMP in the morning. 3. Monitor for overt GI bleed or drop in hgb. 4. Diuresis per nephrology 5. If further fluid accumulation can consider need for paracentesis. 6. Continue PPI   LOS: 1 day     EWalden Field AGNP-C Adult & Gerontological Nurse Practitioner [facility]    12/11/2014, 8:28 AM

## 2014-12-12 DIAGNOSIS — R188 Other ascites: Secondary | ICD-10-CM

## 2014-12-12 DIAGNOSIS — K922 Gastrointestinal hemorrhage, unspecified: Secondary | ICD-10-CM

## 2014-12-12 DIAGNOSIS — R1900 Intra-abdominal and pelvic swelling, mass and lump, unspecified site: Secondary | ICD-10-CM

## 2014-12-12 DIAGNOSIS — R109 Unspecified abdominal pain: Secondary | ICD-10-CM

## 2014-12-12 LAB — CBC
HCT: 25.4 % — ABNORMAL LOW (ref 36.0–46.0)
Hemoglobin: 8.3 g/dL — ABNORMAL LOW (ref 12.0–15.0)
MCH: 28.8 pg (ref 26.0–34.0)
MCHC: 32.7 g/dL (ref 30.0–36.0)
MCV: 88.2 fL (ref 78.0–100.0)
PLATELETS: 124 10*3/uL — AB (ref 150–400)
RBC: 2.88 MIL/uL — AB (ref 3.87–5.11)
RDW: 23.1 % — ABNORMAL HIGH (ref 11.5–15.5)
WBC: 10.4 10*3/uL (ref 4.0–10.5)

## 2014-12-12 LAB — COMPREHENSIVE METABOLIC PANEL
ALK PHOS: 89 U/L (ref 38–126)
ALT: 37 U/L (ref 14–54)
AST: 113 U/L — ABNORMAL HIGH (ref 15–41)
Albumin: 1.8 g/dL — ABNORMAL LOW (ref 3.5–5.0)
Anion gap: 6 (ref 5–15)
BILIRUBIN TOTAL: 8.4 mg/dL — AB (ref 0.3–1.2)
BUN: 23 mg/dL — ABNORMAL HIGH (ref 6–20)
CALCIUM: 7.8 mg/dL — AB (ref 8.9–10.3)
CO2: 25 mmol/L (ref 22–32)
CREATININE: 1.41 mg/dL — AB (ref 0.44–1.00)
Chloride: 100 mmol/L — ABNORMAL LOW (ref 101–111)
GFR, EST AFRICAN AMERICAN: 52 mL/min — AB (ref >60)
GFR, EST NON AFRICAN AMERICAN: 45 mL/min — AB (ref >60)
Glucose, Bld: 140 mg/dL — ABNORMAL HIGH (ref 65–99)
Potassium: 4.1 mmol/L (ref 3.5–5.1)
Sodium: 131 mmol/L — ABNORMAL LOW (ref 135–145)
Total Protein: 7.2 g/dL (ref 6.5–8.1)

## 2014-12-12 MED ORDER — SPIRONOLACTONE 25 MG PO TABS
50.0000 mg | ORAL_TABLET | Freq: Every day | ORAL | Status: DC
  Administered 2014-12-12 – 2014-12-21 (×10): 50 mg via ORAL
  Filled 2014-12-12 (×11): qty 2

## 2014-12-12 MED ORDER — FUROSEMIDE 10 MG/ML IJ SOLN
60.0000 mg | Freq: Two times a day (BID) | INTRAMUSCULAR | Status: DC
  Administered 2014-12-12 (×2): 60 mg via INTRAVENOUS
  Filled 2014-12-12 (×3): qty 6

## 2014-12-12 NOTE — Progress Notes (Signed)
  Subjective:  Patient states she is feeling better. She feels her abdominal distention has decreased since she is able to bend which she was not able to do before. She states her appetite has improved. She still having postprandial bowel movements but denies melena or rectal bleeding. She states she's been sleeping less since hospitalization. She says she has not had any alcohol in 3 weeks. Her daughter who is a CNA lives with her.     Objective: Blood pressure 129/67, pulse 102, temperature 97.3 F (36.3 C), temperature source Oral, resp. rate 20, height 5\' 2"  (1.575 m), weight 264 lb 11.2 oz (120.067 kg), last menstrual period 09/03/2014, SpO2 100 %. Patient is alert and in no acute distress. Asterixis absent. Conjunctiva is pink. Sclera is icteric Cardiac exam with regular rhythm normal S1 and S2. No murmur or gallop noted. Lungs are clear to auscultation. Abdomen is distended but not tense or tender. Pitting edema noted to her flanks. She has 3+ pitting edema involving both legs.   Labs/studies Results:   Recent Labs  12/10/14 0956 12/11/14 0613 12/12/14 0611  WBC 10.2 10.3 10.4  HGB 9.3* 8.4* 8.3*  HCT 28.8* 26.1* 25.4*  PLT 136* 125* 124*    BMET   Recent Labs  12/10/14 0956 12/11/14 0613 12/12/14 0611  NA 130* 131* 131*  K 4.3 4.1 4.1  CL 98* 99* 100*  CO2 24 26 25   GLUCOSE 95 97 140*  BUN 20 22* 23*  CREATININE 1.82* 1.74* 1.41*  CALCIUM 7.8* 7.6* 7.8*    LFT   Recent Labs  12/10/14 0956 12/11/14 0613 12/12/14 0611  PROT 7.7 6.9 7.2  ALBUMIN 1.6* 1.4* 1.8*  AST 176* 144* 113*  ALT 47 42 37  ALKPHOS 109 96 89  BILITOT 9.6* 8.8* 8.4*    PT/INR   Recent Labs  12/10/14 0956  LABPROT 21.8*  INR 1.91*     Assessment:  #1. Alcoholic liver disease. She has alcoholic hepatitis. Imaging studies negative for nodular liver or splenomegaly but she may have underlying cirrhosis. Expect some recovery of hepatic function with continued abstinence.  She will remain on prednisolone for about three weeks. Serum bilirubin is gradually coming down. Serum albumin remains quite low even though she is receiving IV albumin. #2. AKI. She had normal serum creatinine one week ago. Renal function is improving. She is on IV furosemide and albumin. #3. Anemia. She received 2 units of PRBCs few days ago. Her stool was guaiac +2 days ago but no evidence of overt GI bleed. She will need further evaluation down the root. #4. Ascites. Clinically she does not appear to have SBP. However if she develops fever or worsening hepatic function she will need diagnostic abdominal paracenteses.   Recommendations:  Continue prednisone per schedule. Will continue to monitor hepatic and renal function. Will recalculate hepatic discriminant function in 2 days.

## 2014-12-12 NOTE — Progress Notes (Signed)
PROGRESS NOTE  Whitney Bush QMV:[id number] DOB: [birth year] DOA: 12/10/2014 PCP: [place]  Summary: 44 yo female with history of cirrhosis secondary to alcohol abuse and hypertension presented to the ED with leg swelling. Patient was recently discharged 10/8 after being treated with cirrhosis and was transfused blood. While in the ED, she was found to have elevated creatinine of 1.82, Hgb at 9.3, t. Bili 9.6 and stable sodium at 130. Her CXR was unremarkable, however,abdominal u/s revealed ascites within the pelvis. Patient was admitted for further management.   Assessment/Plan: 1. AKI, improving. Favor pre-renal vs ATN at this point.  2. Massive volume overload secondary to cirrhosis, up 12 pounds since October 5. Diuresis per nephrology. No pulmonary compromise. 3. Alcoholic cirrhosis, decompensated, with ascites, hyponatremia, anemia, thrombocytopenia and elevated LFTs. No significant change in LFTs from last admission.  4. Positive hepatitis C antibody, negative viral load. 5. Urinary hesitancy, resolved 6. 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 Continue diuresis per nephrology  Full code: Full DVT prophylaxis:SCDs Family discussion: Mother and daughter at bedside. Discussed with patient who understands and has no concerns at this time. Disposition Plan : Discharge within 1-2 days.   Murray Hodgkins, MD  [facility]  Pager [phone] If 7PM-7AM, please contact night-coverage at [email or web address], password [facility] 12/12/2014, 7:51 AM  LOS: 2 days   Consultants:  GI  Nephrology  Procedures:  Echo Study Conclusions  - Left ventricle: The cavity size was normal. Wall thickness was normal. Systolic function was normal. The estimated ejection fraction was in the range of 60% to 65%. Wall motion was normal; there were no regional wall motion abnormalities. Left ventricular diastolic function parameters were normal. - Mitral  valve: Calcified annulus. There was trivial regurgitation. - Right atrium: Central venous pressure (est): 3 mm Hg. - Tricuspid valve: There was trivial regurgitation. - Pulmonary arteries: PA peak pressure: 28 mm Hg (S). - Pericardium, extracardiac: A trivial pericardial effusion was identified.  Impressions:  - Normal LV wall thickness with LVEF 60-65% and normal diastolic function. MAC with trivial mitral regurgitation. Trivial tricuspid regurgitation with normal PASP 28 mmHg. Trivial pericardial effusion.  Antibiotics:    HPI/Subjective: Feels good. Has good UOP . Denies any SOB, nausea, vomiting, or pain. Is able to eat. No real change to LE yet.  Objective: Filed Vitals:   12/11/14 1130 12/11/14 1509 12/11/14 2151 12/12/14 0552  BP: 125/74 120/80 124/72 110/67  Pulse: 99 99 105 103  Temp: 97.8 F (36.6 C) 98.6 F (37 C) 98.6 F (37 C) 98.2 F (36.8 C)  TempSrc: Oral Oral Oral Oral  Resp: 18 18 18 20   Height:      Weight:      SpO2: 100% 99% 100% 99%    Intake/Output Summary (Last 24 hours) at 12/12/14 0751 Last data filed at 12/11/14 2150  Gross per 24 hour  Intake    100 ml  Output   1550 ml  Net  -1450 ml     Filed Weights   12/10/14 0855 12/10/14 1451 12/10/14 1615  Weight: 120.203 kg (265 lb) 120.702 kg (266 lb 1.6 oz) 120.067 kg (264 lb 11.2 oz)    Exam:     Afebrile, VSS, not hypoxic General:  Appears calm and comfortable Cardiovascular: RRR, no m/r/g. 4+ BLE persists Respiratory: CTA bilaterally, no w/r/r. Normal respiratory effort. Psychiatric: grossly normal mood and affect, speech fluent and appropriate  New data reviewed:  Sodium 131, Creatinine improved at 1.41,  BUN 23  LFTs unchanged  CBC stable, hgb 8.3   Pertinent data since admission:  Creatinine 1.82, calcium 7.8  Hgb 9.3, platelets 136  CXR -Negative low volume portable chest.  Pending data:    Scheduled Meds: . albumin human  25 g Intravenous BID  . folic  acid  1 mg Oral Daily  . furosemide  40 mg Intravenous BID  . multivitamin with minerals  1 tablet Oral Daily  . pantoprazole  40 mg Oral QAC breakfast  . prednisoLONE  40 mg Oral QAC breakfast  . sodium chloride  3 mL Intravenous Q12H  . spironolactone  25 mg Oral Daily  . thiamine  100 mg Oral Daily   Continuous Infusions:   Principal Problem:   AKI (acute kidney injury) ([place]) Active Problems:   Jaundice   Cirrhosis ([place])   Elevated bilirubin   Volume overload   Urinary hesitancy   Ascites   Time spent 15 minutes  By signing my name below, I, Rosalie Doctor attest that this documentation has been prepared under the direction and in the presence of Murray Hodgkins, MD Electronically signed: Rosalie Doctor, Scribe. 12/12/2014 1:55pm  I personally performed the services described in this documentation. All medical record entries made by the scribe were at my direction. I have reviewed the chart and agree that the record reflects my personal performance and is accurate and complete. Murray Hodgkins, MD

## 2014-12-12 NOTE — Progress Notes (Signed)
Subjective: Patient denies any difficulty breathing. She is feeling better. Still she stated that she has some swelling of her thighs. She claims that she is going more to the bathroom today.   Objective: Vital signs in last 24 hours: Temp:  [97.8 F (36.6 C)-98.6 F (37 C)] 98.2 F (36.8 C) (10/15 0552) Pulse Rate:  [99-105] 103 (10/15 0552) Resp:  [18-20] 20 (10/15 0552) BP: (110-125)/(67-80) 110/67 mmHg (10/15 0552) SpO2:  [99 %-100 %] 99 % (10/15 0552)  Intake/Output from previous day: 10/14 0701 - 10/15 0700 In: 100 [IV Piggyback:100] Out: 1550 [Urine:1550] Intake/Output this shift:     Recent Labs  12/10/14 0956 12/11/14 0613 12/12/14 0611  HGB 9.3* 8.4* 8.3*    Recent Labs  12/11/14 0613 12/12/14 0611  WBC 10.3 10.4  RBC 2.93* 2.88*  HCT 26.1* 25.4*  PLT 125* 124*    Recent Labs  12/11/14 0613 12/12/14 0611  NA 131* 131*  K 4.1 4.1  CL 99* 100*  CO2 26 25  BUN 22* 23*  CREATININE 1.74* 1.41*  GLUCOSE 97 140*  CALCIUM 7.6* 7.8*    Recent Labs  12/10/14 0956  INR 1.91*    Generally patient is alert and in no apparent distress Chest is clear to auscultation Heart exam revealed regular rate and rhythm no murmur Abdomen: Obese, difficult to palpate organomegaly and nontender Extremities she has 2-3+ edema  Assessment/Plan: Problem #1 acute kidney injury: Possibly prerenal versus ATN. Her pending creatinine slowly improving. Her potassium is normal. Problem #2 hyponatremia: Possibly hypervolemic hyponatremia and her sodium is stable Problem #3 anasarca: Presently she is on albumin/Aldactone/Lasix and her urine output seems to be improving. She has about 1500 mL of urine this morning. Patient is still with significant sign of fluid overload. Problem #4 liver cirrhosis Problem #5 hypertension: Her blood pressure is reasonably controlled Problem #6 anemia: Hemoglobin is low possibly iron deficiency anemia secondary to GI bleeding. Plan: Increase  Aldactone to 50 mg by mouth once a day 2] increase Lasix to 60 mg IV twice a day 3] we'll check her basic metabolic panel in the morning.   Makenzy Krist S 12/12/2014, 8:34 AM

## 2014-12-13 LAB — BASIC METABOLIC PANEL
ANION GAP: 10 (ref 5–15)
BUN: 23 mg/dL — ABNORMAL HIGH (ref 6–20)
CHLORIDE: 99 mmol/L — AB (ref 101–111)
CO2: 25 mmol/L (ref 22–32)
CREATININE: 1.59 mg/dL — AB (ref 0.44–1.00)
Calcium: 8.1 mg/dL — ABNORMAL LOW (ref 8.9–10.3)
GFR calc non Af Amer: 39 mL/min — ABNORMAL LOW (ref >60)
GFR, EST AFRICAN AMERICAN: 45 mL/min — AB (ref >60)
Glucose, Bld: 101 mg/dL — ABNORMAL HIGH (ref 65–99)
Potassium: 4 mmol/L (ref 3.5–5.1)
Sodium: 134 mmol/L — ABNORMAL LOW (ref 135–145)

## 2014-12-13 MED ORDER — FUROSEMIDE 10 MG/ML IJ SOLN
80.0000 mg | Freq: Two times a day (BID) | INTRAMUSCULAR | Status: DC
  Administered 2014-12-13 (×2): 80 mg via INTRAVENOUS
  Filled 2014-12-13 (×2): qty 8

## 2014-12-13 NOTE — Progress Notes (Signed)
  Subjective:  Patient states her appetite has improved. She has noted more edema to her flanks. She does not feel abdomen is more distended. She denies shortness of breath abdominal pain nausea vomiting melena or rectal bleeding.  Objective: Blood pressure 111/73, pulse 114, temperature 98.4 F (36.9 C), temperature source Oral, resp. rate 20, height 5\' 2"  (1.575 m), weight 263 lb 14.4 oz (119.704 kg), last menstrual period 09/03/2014, SpO2 99 %. Patient is alert and in no acute distress. She does not have asterixis. Conjunctiva is pale. Sclera is icteric Abdomen she has pitting edema to both flanks. Abdomen is distended but not tense or tight. Left lobe of the liver is palpable. Lower extremity edema remains unchanged.  Labs/studies Results:   Recent Labs  12/11/14 0613 12/12/14 0611  WBC 10.3 10.4  HGB 8.4* 8.3*  HCT 26.1* 25.4*  PLT 125* 124*    BMET   Recent Labs  12/11/14 0613 12/12/14 0611 12/13/14 0626  NA 131* 131* 134*  K 4.1 4.1 4.0  CL 99* 100* 99*  CO2 26 25 25   GLUCOSE 97 140* 101*  BUN 22* 23* 23*  CREATININE 1.74* 1.41* 1.59*  CALCIUM 7.6* 7.8* 8.1*    LFT   Recent Labs  12/11/14 0613 12/12/14 0611  PROT 6.9 7.2  ALBUMIN 1.4* 1.8*  AST 144* 113*  ALT 42 37  ALKPHOS 96 89  BILITOT 8.8* 8.4*     Assessment:  #1. Fluid overload in the form of lower extremity edema and tissue edema and ascites which does not appear to be tense. She only has lost 1 pound since admission. Prednisolone unfortunate not helping with fluid overload but benefit outweighs the risk. She is receiving IV albumin which should help mobilize fluid. She was begun on spironolactone 50 mg daily this morning by Dr. Hinda Lenis. #2. AKI. Creatinine has increased in the last 24 hours. Patient is being followed by Dr. Hinda Lenis. #3. Decompensated alcoholic liver disease. She has alcoholic hepatitis but may also have cirrhosis. She is on prednisolone. HCV antibiotic was +10 days ago but  HCVRNA negative. #4. Anemia. H&H is low but stable. No evidence of overt GI bleed. Her stool 3 days ago was guaiac positive. Workup on hold.  Recommendations;  Continue to monitor electrolytes and renal function closely. Continue to monitor CBC and for evidence of active bleeding. LFTs and INR in a.m.

## 2014-12-13 NOTE — Progress Notes (Signed)
Utilization review completed.  

## 2014-12-13 NOTE — Progress Notes (Signed)
PROGRESS NOTE  Whitney Bush NWG:[id number] DOB: 19-Jun-1970 DOA: 12/10/2014 PCP: [place]  Summary: 44 yo female with history of cirrhosis secondary to alcohol abuse and hypertension presented to the ED with leg swelling. Patient was recently discharged 10/8 after being treated with cirrhosis and was transfused blood. While in the ED, she was found to have elevated creatinine of 1.82, Hgb at 9.3, t. Bili 9.6 and stable sodium at 130. Her CXR was unremarkable, however,abdominal u/s revealed ascites within the pelvis. Patient was admitted for further management.   Assessment/Plan: 1. AKI, stable. Favor pre-renal vs ATN.  Management per nephrology.  2. Massive volume overload secondary to cirrhosis. Slowly improving. Diuresis per nephrology. No evident pulmonary compromise. 3. Alcoholic cirrhosis, decompensated, with ascites, hyponatremia, anemia, thrombocytopenia and elevated LFTs. GI following. Continue prednisolone.  4. Positive hepatitis C antibody, negative viral load. 5. Urinary hesitancy, resolved 6. Alcohol dependence in remission 3 weeks   Overall stable. Continue diuresis per nephrology.   Full code: Full DVT prophylaxis:SCDs Family discussion: Discussed with patient who understands and has no concerns at this time. Disposition Plan : Anticipate discharge in 2-3 days.   Murray Hodgkins, MD  [facility]  Pager [phone] If 7PM-7AM, please contact night-coverage at [email or web address], password [facility] 12/13/2014, 7:18 AM  LOS: 3 days   Consultants:  GI  Nephrology  Procedures:  Echo Study Conclusions  - Left ventricle: The cavity size was normal. Wall thickness was normal. Systolic function was normal. The estimated ejection fraction was in the range of 60% to 65%. Wall motion was normal; there were no regional wall motion abnormalities. Left ventricular diastolic function parameters were normal. - Mitral valve: Calcified annulus. There was  trivial regurgitation. - Right atrium: Central venous pressure (est): 3 mm Hg. - Tricuspid valve: There was trivial regurgitation. - Pulmonary arteries: PA peak pressure: 28 mm Hg (S). - Pericardium, extracardiac: A trivial pericardial effusion was identified.  Impressions:  - Normal LV wall thickness with LVEF 60-65% and normal diastolic function. MAC with trivial mitral regurgitation. Trivial tricuspid regurgitation with normal PASP 28 mmHg. Trivial pericardial effusion.  Antibiotics:    HPI/Subjective: Feels well. Her BLE swelling is about the same but she denies any SOB, pain, nausea, or vomiting. Ate lunch.   Objective: Filed Vitals:   12/12/14 0552 12/12/14 1356 12/12/14 2154 12/13/14 0531  BP: 110/67 129/67 116/71 111/73  Pulse: 103 102 109 114  Temp: 98.2 F (36.8 C) 97.3 F (36.3 C) 98.6 F (37 C) 98.4 F (36.9 C)  TempSrc: Oral Oral Oral Oral  Resp: 20 20 20 20   Height:      Weight:    119.704 kg (263 lb 14.4 oz)  SpO2: 99% 100% 100% 99%    Intake/Output Summary (Last 24 hours) at 12/13/14 0718 Last data filed at 12/13/14 0533  Gross per 24 hour  Intake    900 ml  Output   2450 ml  Net  -1550 ml     Filed Weights   12/10/14 1451 12/10/14 1615 12/13/14 0531  Weight: 120.702 kg (266 lb 1.6 oz) 120.067 kg (264 lb 11.2 oz) 119.704 kg (263 lb 14.4 oz)    Exam:     Afebrile, VSS, not hypoxic General:  Appears comfortable, calm. Cardiovascular: Regular rate and rhythm, no murmur, rub or gallop. 3+ LE edema Respiratory: Clear to auscultation bilaterally, no wheezes, rales or rhonchi. Normal respiratory effort. Psychiatric: grossly normal mood and affect, speech fluent and appropriate  New data reviewed:  Sodium 134, Creatinine  1.59, BUN 23  ECHO as above  Pertinent data since admission:  Creatinine 1.82, calcium 7.8  Hgb 9.3, platelets 136  CXR -Negative low volume portable chest.  Pending data:    Scheduled Meds: . albumin human  25  g Intravenous BID  . folic acid  1 mg Oral Daily  . furosemide  60 mg Intravenous BID  . multivitamin with minerals  1 tablet Oral Daily  . pantoprazole  40 mg Oral QAC breakfast  . prednisoLONE  40 mg Oral QAC breakfast  . sodium chloride  3 mL Intravenous Q12H  . spironolactone  50 mg Oral Daily  . thiamine  100 mg Oral Daily   Continuous Infusions:   Principal Problem:   AKI (acute kidney injury) ([place]) Active Problems:   Jaundice   Cirrhosis ([place])   Elevated bilirubin   Volume overload   Urinary hesitancy   Ascites   Time spent 15 minutes  By signing my name below, I, Rosalie Doctor attest that this documentation has been prepared under the direction and in the presence of Murray Hodgkins, MD Electronically signed: Rosalie Doctor, Scribe. 12/13/2014 11:44am  I personally performed the services described in this documentation. All medical record entries made by the scribe were at my direction. I have reviewed the chart and agree that the record reflects my personal performance and is accurate and complete. Murray Hodgkins, MD

## 2014-12-13 NOTE — Progress Notes (Signed)
Subjective: Patient is feeling somewhat better. Still she states that she has swelling of her legs. She denies any difficulty in breathing.   Objective: Vital signs in last 24 hours: Temp:  [97.3 F (36.3 C)-98.6 F (37 C)] 98.4 F (36.9 C) (10/16 0531) Pulse Rate:  [102-114] 114 (10/16 0531) Resp:  [20] 20 (10/16 0531) BP: (111-129)/(67-73) 111/73 mmHg (10/16 0531) SpO2:  [99 %-100 %] 99 % (10/16 0531) Weight:  [263 lb 14.4 oz (119.704 kg)] 263 lb 14.4 oz (119.704 kg) (10/16 0531)  Intake/Output from previous day: 10/15 0701 - 10/16 0700 In: 900 [P.O.:600; IV Piggyback:300] Out: 2450 [Urine:2450] Intake/Output this shift: Total I/O In: 240 [P.O.:240] Out: -    Recent Labs  12/10/14 0956 12/11/14 0613 12/12/14 0611  HGB 9.3* 8.4* 8.3*    Recent Labs  12/11/14 0613 12/12/14 0611  WBC 10.3 10.4  RBC 2.93* 2.88*  HCT 26.1* 25.4*  PLT 125* 124*    Recent Labs  12/12/14 0611 12/13/14 0626  NA 131* 134*  K 4.1 4.0  CL 100* 99*  CO2 25 25  BUN 23* 23*  CREATININE 1.41* 1.59*  GLUCOSE 140* 101*  CALCIUM 7.8* 8.1*    Recent Labs  12/10/14 0956  INR 1.91*    Generally patient is alert and in no apparent distress Chest is clear to auscultation Heart exam revealed regular rate and rhythm no murmur Abdomen: Obese, difficult to palpate organomegaly and nontender Extremities she has 2-3+ edema  Assessment/Plan: Problem #1 acute kidney injury: Possibly prerenal versus ATN. Her BUN and creatinine slightly high today possibly secondary to fluid removal. Problem #2 hyponatremia: Possibly hypervolemic hyponatremia and her sodium has improved. Problem #3 anasarca: Presently she is on albumin/Aldactone/Lasix and her urine output seems to be improving. She has about 2500 mL of urine this morning. Patient is still with significant sign of fluid overload. Problem #4 liver cirrhosis Problem #5 hypertension: Her blood pressure is reasonably controlled Problem #6  anemia: Hemoglobin is low possibly iron deficiency anemia secondary to GI bleeding. Plan: 1] Will continue with Aldactone 2] increase Lasix to 80 mg IV twice a day 3] we'll check her basic metabolic panel in the morning.   Whitney Bush S 12/13/2014, 9:03 AM

## 2014-12-14 ENCOUNTER — Telehealth: Payer: Self-pay | Admitting: Gastroenterology

## 2014-12-14 DIAGNOSIS — K7011 Alcoholic hepatitis with ascites: Secondary | ICD-10-CM

## 2014-12-14 LAB — COMPREHENSIVE METABOLIC PANEL
ALT: 39 U/L (ref 14–54)
ANION GAP: 8 (ref 5–15)
AST: 110 U/L — ABNORMAL HIGH (ref 15–41)
Albumin: 2.3 g/dL — ABNORMAL LOW (ref 3.5–5.0)
Alkaline Phosphatase: 75 U/L (ref 38–126)
BUN: 25 mg/dL — ABNORMAL HIGH (ref 6–20)
CHLORIDE: 99 mmol/L — AB (ref 101–111)
CO2: 29 mmol/L (ref 22–32)
CREATININE: 1.38 mg/dL — AB (ref 0.44–1.00)
Calcium: 8.5 mg/dL — ABNORMAL LOW (ref 8.9–10.3)
GFR, EST AFRICAN AMERICAN: 54 mL/min — AB (ref >60)
GFR, EST NON AFRICAN AMERICAN: 46 mL/min — AB (ref >60)
Glucose, Bld: 95 mg/dL (ref 65–99)
POTASSIUM: 3.8 mmol/L (ref 3.5–5.1)
Sodium: 136 mmol/L (ref 135–145)
Total Bilirubin: 8.9 mg/dL — ABNORMAL HIGH (ref 0.3–1.2)
Total Protein: 7.4 g/dL (ref 6.5–8.1)

## 2014-12-14 LAB — PROTIME-INR
INR: 1.93 — AB (ref 0.00–1.49)
PROTHROMBIN TIME: 22 s — AB (ref 11.6–15.2)

## 2014-12-14 MED ORDER — OXYCODONE HCL 5 MG PO TABS
5.0000 mg | ORAL_TABLET | Freq: Four times a day (QID) | ORAL | Status: DC | PRN
  Administered 2014-12-14 – 2014-12-21 (×11): 5 mg via ORAL
  Filled 2014-12-14 (×11): qty 1

## 2014-12-14 MED ORDER — METOLAZONE 5 MG PO TABS
2.5000 mg | ORAL_TABLET | Freq: Two times a day (BID) | ORAL | Status: DC
  Administered 2014-12-14 – 2014-12-16 (×6): 2.5 mg via ORAL
  Filled 2014-12-14 (×6): qty 1

## 2014-12-14 MED ORDER — TORSEMIDE 20 MG PO TABS
40.0000 mg | ORAL_TABLET | Freq: Every day | ORAL | Status: DC
  Administered 2014-12-14 – 2014-12-18 (×5): 40 mg via ORAL
  Filled 2014-12-14 (×5): qty 2

## 2014-12-14 NOTE — Progress Notes (Signed)
Subjective: Patient is feeling somewhat better. Patient denies any difficulty breathing.   Objective: Vital signs in last 24 hours: Temp:  [97.7 F (36.5 C)-98.8 F (37.1 C)] 97.7 F (36.5 C) (10/17 0444) Pulse Rate:  [98-108] 105 (10/17 0444) Resp:  [18-20] 18 (10/17 0444) BP: (116-130)/(72-78) 116/72 mmHg (10/17 0444) SpO2:  [95 %-100 %] 95 % (10/17 0444) Weight:  [259 lb 4.8 oz (117.618 kg)] 259 lb 4.8 oz (117.618 kg) (10/17 0444)  Intake/Output from previous day: 10/16 0701 - 10/17 0700 In: 1060 [P.O.:960; IV Piggyback:100] Out: -  Intake/Output this shift:     Recent Labs  12/12/14 0611  HGB 8.3*    Recent Labs  12/12/14 0611  WBC 10.4  RBC 2.88*  HCT 25.4*  PLT 124*    Recent Labs  12/13/14 0626 12/14/14 0639  NA 134* 136  K 4.0 3.8  CL 99* 99*  CO2 25 29  BUN 23* 25*  CREATININE 1.59* 1.38*  GLUCOSE 101* 95  CALCIUM 8.1* 8.5*    Recent Labs  12/14/14 0639  INR 1.93*    Generally patient is alert and in no apparent distress Chest is clear to auscultation Heart exam revealed regular rate and rhythm no murmur Abdomen: Obese, difficult to palpate organomegaly and nontender Extremities she has 2-3+ edema  Assessment/Plan: Problem #1 acute kidney injury: Possibly prerenal versus ATN. Her renal function is improving. Problem #2 hyponatremia: Possibly hypervolemic hyponatremia . Her sodium has corrected.. Problem #3 anasarca: Presently she is on albumin/Aldactone/Lasix and her urine output seems to be improving. She has about 2500 mL of urine this morning. Patient has lost about 9 pounds. Since she has significant edema. Problem #4 liver cirrhosis Problem #5 hypertension: Her blood pressure is reasonably controlled Problem #6 anemia: Hemoglobin is low possibly iron deficiency anemia secondary to GI bleeding. Plan: 1] Will continue with Aldactone 2] will DC Lasix 3] we'll check her basic metabolic panel in the morning. 4] we'll start patient on  metolazone 2.5 mg by mouth twice a day 5] we'll start patient on Demadex 40 mg by mouth once a day.   Whitney Bush 12/14/2014, 8:13 AM

## 2014-12-14 NOTE — Progress Notes (Signed)
Subjective:  Feels much better. Appetite improved.   Objective: Vital signs in last 24 hours: Temp:  [97.7 F (36.5 C)-98.8 F (37.1 C)] 97.7 F (36.5 C) (10/17 0444) Pulse Rate:  [98-108] 105 (10/17 0444) Resp:  [18-20] 18 (10/17 0444) BP: (116-130)/(72-78) 116/72 mmHg (10/17 0444) SpO2:  [95 %-100 %] 95 % (10/17 0444) Weight:  [259 lb 4.8 oz (117.618 kg)] 259 lb 4.8 oz (117.618 kg) (10/17 0444) Last BM Date: 12/13/14 General:   Alert,  Well-developed, well-nourished, pleasant and cooperative in NAD Head:  Normocephalic and atraumatic. Eyes:  Sclera clear, no icterus.  Chest: CTA bilaterally without rales, rhonchi, crackles.    Heart:  Regular rate and rhythm; no murmurs, clicks, rubs,  or gallops. Abdomen:  Soft, nontender and nondistended.  1+pitting edema in bilateral flanks.  Extremities:  Without clubbing, deformity. 2+ pitting edema to knees. Neurologic:  Alert and  oriented x4;  grossly normal neurologically. Skin:  Intact without significant lesions or rashes. Psych:  Alert and cooperative. Normal mood and affect.  Intake/Output from previous day: 10/16 0701 - 10/17 0700 In: 1060 [P.O.:960; IV Piggyback:100] Out: -  Intake/Output this shift:    Lab Results: CBC  Recent Labs  12/12/14 0611  WBC 10.4  HGB 8.3*  HCT 25.4*  MCV 88.2  PLT 124*   BMET  Recent Labs  12/12/14 0611 12/13/14 0626 12/14/14 0639  NA 131* 134* 136  K 4.1 4.0 3.8  CL 100* 99* 99*  CO2 25 25 29   GLUCOSE 140* 101* 95  BUN 23* 23* 25*  CREATININE 1.41* 1.59* 1.38*  CALCIUM 7.8* 8.1* 8.5*   LFTs  Recent Labs  12/12/14 0611 12/14/14 0639  BILITOT 8.4* 8.9*  ALKPHOS 89 75  AST 113* 110*  ALT 37 39  PROT 7.2 7.4  ALBUMIN 1.8* 2.3*   No results for input(s): LIPASE in the last 72 hours. PT/INR  Recent Labs  12/14/14 0639  LABPROT 22.0*  INR 1.93*      Imaging Studies: Ct Abdomen Pelvis Wo Contrast  12/02/2014  CLINICAL DATA:  Nausea for 2 weeks. Elevated  bilirubin level. Jaundice. EXAM: CT ABDOMEN AND PELVIS WITHOUT CONTRAST TECHNIQUE: Multidetector CT imaging of the abdomen and pelvis was performed following the standard protocol without IV contrast. COMPARISON:  None. FINDINGS: Lack of intravenous contrast severely limits the examination. The liver is heterogeneous. Detail is obscured by a lack of intravenous contrast. Gallbladder, spleen, pancreas, adrenal glands, and kidneys are within normal limits. The uterus is lobulated worrisome for fibroids. Bladder is decompressed. There is a small amount of free fluid scattered throughout the peritoneal space. There is stranding throughout the peritoneal fat as well as the subcutaneous fat worrisome for anasarca. Bowel is decompressed.  There is no free intraperitoneal gas. No vertebral compression deformity. Tiny left pleural effusion. IMPRESSION: The liver is diffusely heterogeneous which may be related to diffuse hepatic parenchymal disease or diffuse infiltration by tumor. Contrast enhanced CT or MRI of the liver is recommended. Free-fluid Diffuse edema. Tiny left pleural effusion. Uterine fibroids. Electronically Signed   By: Marybelle Killings M.D.   On: 12/02/2014 18:03   Ct Abdomen Pelvis W Contrast  12/03/2014  CLINICAL DATA:  History of cirrhosis with elevated LFTs EXAM: CT ABDOMEN AND PELVIS WITH CONTRAST TECHNIQUE: Multidetector CT imaging of the abdomen and pelvis was performed using the standard protocol following bolus administration of intravenous contrast. CONTRAST:  159mL OMNIPAQUE IOHEXOL 300 MG/ML  SOLN COMPARISON:  12/02/2014 FINDINGS: Lung bases are free  of acute infiltrate or sizable effusion. The liver is again prominent but diffusely decreased in attenuation throughout all 3 phases of the contrast enhancement. This is most noted in the dome of the liver in the right lobe although no discrete mass lesion is noted. This likely represents a degree of fatty infiltration but also likely related to the  underlying cirrhotic change. The spleen, adrenal glands, pancreas and kidneys are within normal limits. The gallbladder is unremarkable. The appendix is well visualized and within normal limits. Mild ascites is seen. The bladder is decompressed. The uterus is prominent with fibroid change. No ovarian mass lesion is noted. No vascular abnormality is seen. No inflammatory changes of the bowel are seen. Mild anasarca is noted within the subcutaneous soft tissues. IMPRESSION: Changes of mild ascites and anasarca similar to that seen on the prior exam. Diffuse irregular decreased attenuation of the liver as described. This likely represents a combination of fatty liver and underlying cirrhotic change. No discrete mass lesion is noted. If clinically indicated a contrast enhanced MRI of the liver would be best for evaluation of small lesions. This would be best performed as an outpatient when the patient's condition improves. The remainder of the exam is stable from the prior study. Electronically Signed   By: Inez Catalina M.D.   On: 12/03/2014 18:45   US Abdomen Limited  12/10/2014  CLINICAL DATA:  Abdominal swelling EXAM: LIMITED ABDOMEN ULTRASOUND FOR ASCITES TECHNIQUE: Limited ultrasound survey for ascites was performed in all four abdominal quadrants. COMPARISON:  CT 12/03/2014 FINDINGS: Minimal fluid in the RIGHT upper quadrant and LEFT upper quadrant. Moderate volume ascites within the pelvis midline. IMPRESSION: Moderate volume of ascites within the pelvis. Electronically Signed   By: Suzy Bouchard M.D.   On: 12/10/2014 13:02   Dg Chest Port 1 View  12/10/2014  CLINICAL DATA:  Shortness of breath EXAM: PORTABLE CHEST 1 VIEW COMPARISON:  12/02/2014 FINDINGS: Low volume chest without pneumonia or edema. No effusion or air leak. Normal heart size and aortic contours. IMPRESSION: Negative low volume portable chest. Electronically Signed   By: Monte Fantasia M.D.   On: 12/10/2014 10:16   Dg Abd Acute  W/chest  12/02/2014  CLINICAL DATA:  Generalized abdominal pain for several weeks. EXAM: DG ABDOMEN ACUTE W/ 1V CHEST COMPARISON:  Jul 08, 2014. FINDINGS: There is no evidence of dilated bowel loops or free intraperitoneal air. No radiopaque calculi or other significant radiographic abnormality is seen. Heart size and mediastinal contours are within normal limits. Both lungs are clear. IMPRESSION: No evidence of bowel obstruction or ileus. No acute cardiopulmonary disease. Electronically Signed   By: Marijo Conception, M.D.   On: 12/02/2014 13:54  [2 weeks]   Assessment: 44 year old female with recent admission for alcoholic hepatitis (possible underlying cirrhosis) now with volume overload and AKI for unknown etiology, being seen by nephrology.  Fluid overload in the form of lower extremity edema and tissue edema and ascites which does not appear to be tense. She has lost 6 pounds since admission. Prednisolone unfortunate not helping with fluid overload but benefit outweighs the risk. She has received IV albumin X 6 with lasix. Currently on lasix 80mg  IV BID, spironolactone 50 mg daily this morning by Dr. Hinda Lenis.  AKI. Creatinine improved over last 24 hours. Patient is being followed by Dr. Hinda Lenis.  Decompensated alcoholic liver disease. She has alcoholic hepatitis but may also have cirrhosis. She is on prednisolone. HCV antibiotic was +10 days ago but HCVRNA  negative. DF 54.   Anemia. H&H is low but stable. No evidence of overt GI bleed. Her stool since admission was guaiac positive. Workup on hold.  Plan: 1. Continue prednisolone 40mg  for 30 days, then two week taper.  2. Continue PPI. 3. Follow up in office in two weeks, LFTs, PT/INR, CBC prior to OV. 4. Dietician consult for 2 gram sodium diet education.  5. Will sign off. Call with questions.   Whitney Bush. Bernarda Caffey [facility] [phone] 10/17/20169:42 AM     LOS: 4 days

## 2014-12-14 NOTE — Progress Notes (Signed)
PROGRESS NOTE  Whitney Bush:[id number] DOB: 1970/10/25 DOA: 12/10/2014 PCP: [place]  Summary: 44 yo female with history of cirrhosis secondary to alcohol abuse and hypertension presented to the ED with leg swelling. Patient was recently discharged 10/8 after being treated with cirrhosis and was transfused blood. While in the ED, she was found to have elevated creatinine of 1.82, Hgb at 9.3, t. Bili 9.6 and stable sodium at 130. Her CXR was unremarkable, however,abdominal u/s revealed ascites within the pelvis. Patient was admitted for further management.   Assessment/Plan: 1. AKI, stable. Favor pre-renal vs ATN. Nephrology managing. 2. Massive volume overload secondary to cirrhosis. Improving. No evident pulmonary compromise. 3. Alcoholic cirrhosis, decompensated, with ascites, hyponatremia, anemia, thrombocytopenia and elevated LFTs. Appreciate GI input. Continue prednisolone. Appears stable. 4. Positive hepatitis C antibody, negative viral load. 5. Alcohol dependence in remission 3 weeks   Overall slowly improving with weight loss/diuresis.    Continue diuretics per nephrology.   Follow up with GI as an outpatient in 2 weeks.  Full code: Full DVT prophylaxis:SCDs Family discussion: Discussed with patient who understands and has no concerns at this time. Disposition Plan : Anticipate discharge in 1-2 days.   Murray Hodgkins, MD  [facility]  Pager [phone] If 7PM-7AM, please contact night-coverage at [email or web address], password [facility] 12/14/2014, 7:05 AM  LOS: 4 days   Consultants:  GI  Nephrology  Procedures:  Echo Study Conclusions  - Left ventricle: The cavity size was normal. Wall thickness was normal. Systolic function was normal. The estimated ejection fraction was in the range of 60% to 65%. Wall motion was normal; there were no regional wall motion abnormalities. Left ventricular diastolic function parameters were normal. -  Mitral valve: Calcified annulus. There was trivial regurgitation. - Right atrium: Central venous pressure (est): 3 mm Hg. - Tricuspid valve: There was trivial regurgitation. - Pulmonary arteries: PA peak pressure: 28 mm Hg (S). - Pericardium, extracardiac: A trivial pericardial effusion was identified.  Impressions:  - Normal LV wall thickness with LVEF 60-65% and normal diastolic function. MAC with trivial mitral regurgitation. Trivial tricuspid regurgitation with normal PASP 28 mmHg. Trivial pericardial effusion.  Antibiotics:    HPI/Subjective: Feels ok. Eating ok. Breathing ok. Some improvement in bilateral LE edema. Still has some bilateral flank pain secondary to edema.  Objective: Filed Vitals:   12/13/14 0531 12/13/14 1410 12/13/14 2140 12/14/14 0444  BP: 111/73 120/78 130/78 116/72  Pulse: 114 98 108 105  Temp: 98.4 F (36.9 C) 98 F (36.7 C) 98.8 F (37.1 C) 97.7 F (36.5 C)  TempSrc: Oral Oral Oral Oral  Resp: 20 20 20 18   Height:      Weight: 119.704 kg (263 lb 14.4 oz)   117.618 kg (259 lb 4.8 oz)  SpO2: 99% 98% 100% 95%    Intake/Output Summary (Last 24 hours) at 12/14/14 0705 Last data filed at 12/14/14 0600  Gross per 24 hour  Intake   1060 ml  Output      0 ml  Net   1060 ml     Filed Weights   12/10/14 1615 12/13/14 0531 12/14/14 0444  Weight: 120.067 kg (264 lb 11.2 oz) 119.704 kg (263 lb 14.4 oz) 117.618 kg (259 lb 4.8 oz)    Exam: Afebrile, VSS, not hypoxic General:  Appears calm and comfortable Cardiovascular: RRR, no m/r/g. No significant change in BLE edema, 3+ Respiratory: CTA bilaterally, no w/r/r. Normal respiratory effort. Psychiatric: grossly normal mood. Flat affect. Speech fluent  and appropriate  New data reviewed:  UOP 1250  Weight down 6 pounds since admission  BUN and creatinine stable.   Scheduled Meds: . albumin human  25 g Intravenous BID  . folic acid  1 mg Oral Daily  . furosemide  80 mg Intravenous BID  .  multivitamin with minerals  1 tablet Oral Daily  . pantoprazole  40 mg Oral QAC breakfast  . prednisoLONE  40 mg Oral QAC breakfast  . sodium chloride  3 mL Intravenous Q12H  . spironolactone  50 mg Oral Daily  . thiamine  100 mg Oral Daily   Continuous Infusions:   Principal Problem:   AKI (acute kidney injury) ([place]) Active Problems:   Jaundice   Cirrhosis ([place])   Elevated bilirubin   Volume overload   Urinary hesitancy   Ascites   Time spent 15 minutes   By signing my name below, I, Rennis Harding attest that this documentation has been prepared under the direction and in the presence of Murray Hodgkins, MD Electronically signed: Rennis Harding  12/14/2014   I personally performed the services described in this documentation. All medical record entries made by the scribe were at my direction. I have reviewed the chart and agree that the record reflects my personal performance and is accurate and complete. Murray Hodgkins, MD

## 2014-12-14 NOTE — Plan of Care (Signed)
Problem: Food- and Nutrition-Related Knowledge Deficit (NB-1.1) Goal: Nutrition education Formal process to instruct or train a patient/client in a skill or to impart knowledge to help patients/clients voluntarily manage or modify food choices and eating behavior to maintain or improve health. Outcome: Completed/Met Date Met:  12/14/14 Nutrition Education Note  RD consulted for nutrition education regarding Low Sodium Diet  RD provided "Low Sodium Nutrition Therapy" handout from the Academy of [employer] and Dietetics.   Reviewed patient's dietary recall.  Breakfast: Cheerios Lunch: Tossed Salad, PBJ, hotdog 1x a week, other foods include: canned soups/lunch meat or deli meats/mac n cheese/frozen pizza Dinner: Potatoes, steak, eats Fast food ~3x a week (mcdonalds etc)  Snacks: Potato chips Beverages: Soda/Juice  Provided examples on ways to decrease sodium intake in diet. Her biggest issue is the fast food. As an alternative to fast food, Discussed preparing her own meals ahead of time and freezing them for convenience. If she is still inclined to eat out, recommended better choices.   Discouraged intake of processed foods/canned foods. Explained how her canned soups, deli meats, canned vegetables, frozen pizzas,  all contain very large amounts of sodium. Discussed alternatives. Encouraged fresh/frozen fruits and vegetables. Pt reportedly eats a lot of cheese. Reccommended reducing amount she eats as well as choosing the lower sodium types.   Educated pt on foods that often unknowingly contain large amounts of sodium like condiments and dressings.   Went over the importance of label reading. Discussed to the 2g sodium limit. Explained that she may have a high sodium food at a meal, but she should try to keep her DAILY intake to < 2 grams. Encouraged taking small steps to change her diet to better promote lifestyle change.   RD discussed why it is important for patient to adhere to diet  recommendations.  Expect Good compliance.  Body mass index is 47.41 kg/(m^2). Pt meets criteria for Morbid obesity based on current BMI.  Current diet order is Heart Healthy w/ 1800 ml fluid restricition, patient is consuming approximately 95% of meals at this time. Labs and medications reviewed. No further nutrition interventions warranted at this time. RD contact information provided. If additional nutrition issues arise, please re-consult RD.   Burtis Junes RD, LDN Nutrition Pager: [phone] 12/14/2014 11:26 AM

## 2014-12-14 NOTE — Telephone Encounter (Signed)
PATIENT ON SCHEDULE FOR 12/17/14

## 2014-12-14 NOTE — Telephone Encounter (Signed)
Hospital follow up in 2 weeks for anemia, etoh hepatitis. May use urgent.  Needs CBC/platelet, CMET, PT/INR in 10 days.

## 2014-12-15 LAB — BASIC METABOLIC PANEL
Anion gap: 10 (ref 5–15)
BUN: 29 mg/dL — ABNORMAL HIGH (ref 6–20)
CHLORIDE: 96 mmol/L — AB (ref 101–111)
CO2: 29 mmol/L (ref 22–32)
Calcium: 8.9 mg/dL (ref 8.9–10.3)
Creatinine, Ser: 1.42 mg/dL — ABNORMAL HIGH (ref 0.44–1.00)
GFR calc Af Amer: 52 mL/min — ABNORMAL LOW (ref >60)
GFR calc non Af Amer: 45 mL/min — ABNORMAL LOW (ref >60)
GLUCOSE: 120 mg/dL — AB (ref 65–99)
POTASSIUM: 3.6 mmol/L (ref 3.5–5.1)
Sodium: 135 mmol/L (ref 135–145)

## 2014-12-15 NOTE — Telephone Encounter (Signed)
Patient currently in the hospital. Please cancel 12/17/14 appointment and make new one for 2 weeks from now.

## 2014-12-15 NOTE — Progress Notes (Signed)
[facility] PROGRESS NOTE  Whitney Bush RJJ:[id number] DOB: 1970/11/17 DOA: 12/10/2014 PCP: [place]  Assessment/Plan: ARF -Likely prerenal vs ATN. -Renal function is stable to slightly worsened. -Appreciate nephrology following.  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 -Is 4.388 L negative since admission and still has significant volume overload on exam. -Is on aldactone, demadex and metolazone.  Positive Hep C Antibody; negative viral load   Code Status: Full Code Family Communication: Patient only  Disposition Plan: Home when ready   Consultants:  Renal   Antibiotics:  None   Subjective: Feels like leg edema has improved some. Denies CP/SOB/abdominal pain.  Objective: Filed Vitals:   12/14/14 1523 12/15/14 0030 12/15/14 0500 12/15/14 0600  BP: 126/83 119/70 128/81   Pulse: 96 95    Temp:  98 F (36.7 C) 98.1 F (36.7 C)   TempSrc:  Oral Oral   Resp: 18 18 18    Height:      Weight:    116.2 kg (256 lb 2.8 oz)  SpO2: 100% 100% 100%     Intake/Output Summary (Last 24 hours) at 12/15/14 1011 Last data filed at 12/15/14 0700  Gross per 24 hour  Intake    702 ml  Output   3850 ml  Net  -3148 ml   Filed Weights   12/13/14 0531 12/14/14 0444 12/15/14 0600  Weight: 119.704 kg (263 lb 14.4 oz) 117.618 kg (259 lb 4.8 oz) 116.2 kg (256 lb 2.8 oz)    Exam:   General:  AA Ox3  Cardiovascular: RRR  Respiratory: CTA B  Abdomen: distended, +BS  Extremities: 3++ edema.   Neurologic:  Intact and non-focal  Data Reviewed: Basic Metabolic Panel:  Recent Labs Lab 12/11/14 0613 12/12/14 0611 12/13/14 0626 12/14/14 0639 12/15/14 0558  NA 131* 131* 134* 136 135  K 4.1 4.1 4.0 3.8 3.6  CL 99* 100* 99* 99* 96*  CO2 26 25 25 29 29   GLUCOSE 97 140* 101* 95 120*  BUN 22* 23* 23* 25* 29*  CREATININE 1.74* 1.41* 1.59* 1.38* 1.42*  CALCIUM 7.6* 7.8* 8.1* 8.5* 8.9    Liver Function Tests:  Recent Labs Lab 12/10/14 0956 12/11/14 0613 12/12/14 0611 12/14/14 0639  AST 176* 144* 113* 110*  ALT 47 42 37 39  ALKPHOS 109 96 89 75  BILITOT 9.6* 8.8* 8.4* 8.9*  PROT 7.7 6.9 7.2 7.4  ALBUMIN 1.6* 1.4* 1.8* 2.3*    Recent Labs Lab 12/10/14 0956  LIPASE 58*   No results for input(s): AMMONIA in the last 168 hours. CBC:  Recent Labs Lab 12/10/14 0956 12/11/14 0613 12/12/14 0611  WBC 10.2 10.3 10.4  NEUTROABS 7.2  --   --   HGB 9.3* 8.4* 8.3*  HCT 28.8* 26.1* 25.4*  MCV 89.7 89.1 88.2  PLT 136* 125* 124*   Cardiac Enzymes: No results for input(s): CKTOTAL, CKMB, CKMBINDEX, TROPONINI in the last 168 hours. BNP (last 3 results)  Recent Labs  07/08/14 0912  BNP 22.0    ProBNP (last 3 results) No results for input(s): PROBNP in the last 8760 hours.  CBG: No results for input(s): GLUCAP in the last 168 hours.  No results found for this or any previous visit (from the past 240 hour(s)).   Studies: No results found.  Scheduled Meds: . albumin human  25 g Intravenous BID  . folic acid  1 mg Oral Daily  . metolazone  2.5 mg  Oral BID  . multivitamin with minerals  1 tablet Oral Daily  . pantoprazole  40 mg Oral QAC breakfast  . prednisoLONE  40 mg Oral QAC breakfast  . sodium chloride  3 mL Intravenous Q12H  . spironolactone  50 mg Oral Daily  . thiamine  100 mg Oral Daily  . torsemide  40 mg Oral Daily   Continuous Infusions:   Principal Problem:   AKI (acute kidney injury) ([place]) Active Problems:   Jaundice   Cirrhosis ([facility])   Elevated bilirubin   Volume overload   Urinary hesitancy   Ascites   Alcoholic hepatitis with ascites    Time spent: 25 minutes. Greater than 50% of this time was spent in direct contact with the patient coordinating care.    Lelon Frohlich  [facility] Pager [phone]  If 7PM-7AM, please contact night-coverage at [email or web address], password [facility] 12/15/2014, 10:11 AM  LOS:  5 days

## 2014-12-15 NOTE — Care Management Note (Signed)
Case Management Note  Patient Details  Name: Whitney Bush MRN: [id number] Date of Birth: [birth year]  Subjective/Objective:                    Action/Plan:   Expected Discharge Date:  12/14/14               Expected Discharge Plan:  Home/Self Care  In-House Referral:  NA  Discharge planning Services  CM Consult  Post Acute Care Choice:  NA Choice offered to:  NA  DME Arranged:    DME Agency:     HH Arranged:    [place] Agency:     Status of Service:  Completed, signed off  Medicare Important Message Given:    Date Medicare IM Given:    Medicare IM give by:    Date Additional Medicare IM Given:    Additional Medicare Important Message give by:     If discussed at [place] of Stay Meetings, dates discussed:  12/15/14  Additional Comments:  Joylene Draft, RN 12/15/2014, 3:25 PM

## 2014-12-15 NOTE — Telephone Encounter (Signed)
CHANGED APPOINTMENT AND CONTACTED NURSE ON PT FLOOR AND SHE WILL GIVE HER NEW DATE

## 2014-12-15 NOTE — Progress Notes (Signed)
Subjective: Patient denies any nausea or vomiting and feels better. Presently she does not have any complaint  Objective: Vital signs in last 24 hours: Temp:  [97.9 F (36.6 C)-98.1 F (36.7 C)] 98.1 F (36.7 C) (10/18 0500) Pulse Rate:  [95-101] 95 (10/18 0030) Resp:  [18] 18 (10/18 0500) BP: (119-142)/(70-92) 128/81 mmHg (10/18 0500) SpO2:  [100 %] 100 % (10/18 0500) Weight:  [256 lb 2.8 oz (116.2 kg)] 256 lb 2.8 oz (116.2 kg) (10/18 0600)  Intake/Output from previous day: 10/17 0701 - 10/18 0700 In: 942 [P.O.:942] Out: 4250 [Urine:4250] Intake/Output this shift:    No results for input(s): HGB in the last 72 hours. No results for input(s): WBC, RBC, HCT, PLT in the last 72 hours.  Recent Labs  12/14/14 0639 12/15/14 0558  NA 136 135  K 3.8 3.6  CL 99* 96*  CO2 29 29  BUN 25* 29*  CREATININE 1.38* 1.42*  GLUCOSE 95 120*  CALCIUM 8.5* 8.9    Recent Labs  12/14/14 0639  INR 1.93*    Generally patient is alert and in no apparent distress Chest is clear to auscultation Heart exam revealed regular rate and rhythm no murmur Abdomen: Obese, difficult to palpate organomegaly and nontender Extremities she has 2-3+ edema  Assessment/Plan: Problem #1 acute kidney injury: Possibly prerenal versus ATN. Her renal function seems stable Problem #2 hyponatremia: Possibly hypervolemic hyponatremia . Her sodium has corrected.. Problem #3 anasarca: Presently she is on albumin/Aldactone/Lasix and her urine output seems to be improving. She has about 3200 mL of urine out put over the last 24 hours. She is on Demadex 40 mg once a day,Metolazone 2.5 mg po bid and Aldactone 50 mg po once a day.  Problem #4 liver cirrhosis Problem #5 hypertension: Her blood pressure is reasonably controlled Problem #6 anemia: Possibly iron deficiency anemia. Patient with heme positive stool and being followed bu GI Plan: 1] Will continue with present treatment until patient looses about 10 to 12 kg  and D/C metolazone and Demadex. 2] Basic metabolic panel in am   [facility] S 12/15/2014, 7:55 AM

## 2014-12-16 ENCOUNTER — Other Ambulatory Visit: Payer: Self-pay

## 2014-12-16 ENCOUNTER — Other Ambulatory Visit: Payer: Self-pay | Admitting: Gastroenterology

## 2014-12-16 DIAGNOSIS — K7031 Alcoholic cirrhosis of liver with ascites: Secondary | ICD-10-CM

## 2014-12-16 LAB — BASIC METABOLIC PANEL
ANION GAP: 12 (ref 5–15)
BUN: 31 mg/dL — ABNORMAL HIGH (ref 6–20)
CALCIUM: 9.9 mg/dL (ref 8.9–10.3)
CO2: 34 mmol/L — AB (ref 22–32)
CREATININE: 1.51 mg/dL — AB (ref 0.44–1.00)
Chloride: 89 mmol/L — ABNORMAL LOW (ref 101–111)
GFR calc Af Amer: 48 mL/min — ABNORMAL LOW (ref >60)
GFR calc non Af Amer: 42 mL/min — ABNORMAL LOW (ref >60)
GLUCOSE: 144 mg/dL — AB (ref 65–99)
Potassium: 3.2 mmol/L — ABNORMAL LOW (ref 3.5–5.1)
Sodium: 135 mmol/L (ref 135–145)

## 2014-12-16 MED ORDER — POTASSIUM CHLORIDE 20 MEQ PO PACK
40.0000 meq | PACK | Freq: Once | ORAL | Status: AC
  Administered 2014-12-16: 40 meq via ORAL
  Filled 2014-12-16: qty 2

## 2014-12-16 NOTE — Progress Notes (Signed)
Whitney Bush  MRN: [id number]  DOB/AGE: Dec 08, 1970 44 y.o.  [facility] date: 12/10/2014  Chief Complaint:  Chief Complaint  Patient presents with  . Leg Swelling    S-Pt presented on  12/10/2014 with  Chief Complaint  Patient presents with  . Leg Swelling  .    Pt today feels better. Pt says my swelling is much better  Meds . albumin human  25 g Intravenous BID  . folic acid  1 mg Oral Daily  . metolazone  2.5 mg Oral BID  . multivitamin with minerals  1 tablet Oral Daily  . pantoprazole  40 mg Oral QAC breakfast  . prednisoLONE  40 mg Oral QAC breakfast  . sodium chloride  3 mL Intravenous Q12H  . spironolactone  50 mg Oral Daily  . thiamine  100 mg Oral Daily  . torsemide  40 mg Oral Daily      Physical Exam: Vital signs in last 24 hours: Temp:  [98.1 F (36.7 C)-99.7 F (37.6 C)] 99.7 F (37.6 C) (10/19 1353) Pulse Rate:  [92-101] 94 (10/19 1353) Resp:  [18-20] 18 (10/19 1353) BP: (108-139)/(66-89) 131/89 mmHg (10/19 1353) SpO2:  [100 %] 100 % (10/19 1353) Weight:  [237 lb 14.4 oz (107.911 kg)] 237 lb 14.4 oz (107.911 kg) (10/19 0756) Weight change:  Last BM Date: 12/16/14  Intake/Output from previous day: 10/18 0701 - 10/19 0700 In: 480 [P.O.:480] Out: 4700 [Urine:4700] Total I/O In: 480 [P.O.:480] Out: 1100 [Urine:1100]   Physical Exam: General- pt is awake,alert, oriented to time place and person Resp- No acute REsp distress, CTA B/L NO Rhonchi CVS- S1S2 regular in rate and rhythm GIT- BS+, soft, NT, ND EXT- 2+ LE Edema, Cyanosis   Lab Results: CBC No results for input(s): WBC, HGB, HCT, PLT in the last 72 hours.  BMET  Recent Labs  12/15/14 0558 12/16/14 0845  NA 135 135  K 3.6 3.2*  CL 96* 89*  CO2 29 34*  GLUCOSE 120* 144*  BUN 29* 31*  CREATININE 1.42* 1.51*  CALCIUM 8.9 9.9   Creat 2016   1.82=>1.42--1.5   MICRO No results found for this or any previous visit  (from the past 240 hour(s)).    Lab Results  Component Value Date   CALCIUM 9.9 12/16/2014          Impression: 1)Renal  AKI secondary to ATN                AKI stable                Creat minimally high.             2)HTN BP stable  Medication- On Diuretics-torsemide + spironolactone + metoloazone    3)Anemia HGb stable Anemia of chronic ds  4)Liver- cirhosis   GI and primary team following  5)Anasarca On Diuretics Negative by 9.2 liters since admission  6)Electrolytes Hypokalemic      Sec to diuretics NOrmonatremic  7)Acid base Co2 at goal     Plan:   Will suggest current tx , till admitted. Will replete Kcl Will follow BMet Some rise in creat is expected with diuresis, if high in am, will reduce diuretics     BHUTANI,MANPREET S 12/16/2014, 3:39 PM

## 2014-12-16 NOTE — Progress Notes (Signed)
[facility] PROGRESS NOTE  ZEINA AKKERMAN VQM:[id number] DOB: [birth year] DOA: 12/10/2014 PCP: [place]  Assessment/Plan: ARF -Likely prerenal vs ATN. -Renal function is stable to slightly worsened. -Appreciate nephrology following.  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 -Is 8.6 L negative since admission and about 3 L negative overnight. -Is on aldactone, demadex and metolazone. -Still with significant volume overload on exam, altho improved from yesterday.  Positive Hep C Antibody; negative viral load   Code Status: Full Code Family Communication: Patient only  Disposition Plan: Home when ready; likely 24-48 hours.   Consultants:  Renal   Antibiotics:  None   Subjective: Feels like leg edema has improved some. Denies CP/SOB/abdominal pain.  Objective: Filed Vitals:   12/15/14 1337 12/15/14 2227 12/16/14 0610 12/16/14 0756  BP: 121/69 139/79 108/66   Pulse: 109 101 92   Temp: 98.3 F (36.8 C) 98.1 F (36.7 C) 98.6 F (37 C)   TempSrc:  Oral Oral   Resp: 18 20 18    Height:      Weight:    107.911 kg (237 lb 14.4 oz)  SpO2: 100% 100% 100%     Intake/Output Summary (Last 24 hours) at 12/16/14 1127 Last data filed at 12/15/14 2227  Gross per 24 hour  Intake    240 ml  Output   4100 ml  Net  -3860 ml   Filed Weights   12/14/14 0444 12/15/14 0600 12/16/14 0756  Weight: 117.618 kg (259 lb 4.8 oz) 116.2 kg (256 lb 2.8 oz) 107.911 kg (237 lb 14.4 oz)    Exam:   General:  AA Ox3  Cardiovascular: RRR  Respiratory: CTA B  Abdomen: distended, +BS  Extremities: 2++ edema.   Neurologic:  Intact and non-focal  Data Reviewed: Basic Metabolic Panel:  Recent Labs Lab 12/12/14 0611 12/13/14 0626 12/14/14 0639 12/15/14 0558 12/16/14 0845  NA 131* 134* 136 135 135  K 4.1 4.0 3.8 3.6 3.2*  CL 100* 99* 99* 96* 89*  CO2 25 25 29 29  34*  GLUCOSE 140* 101* 95 120*  144*  BUN 23* 23* 25* 29* 31*  CREATININE 1.41* 1.59* 1.38* 1.42* 1.51*  CALCIUM 7.8* 8.1* 8.5* 8.9 9.9   Liver Function Tests:  Recent Labs Lab 12/10/14 0956 12/11/14 0613 12/12/14 0611 12/14/14 0639  AST 176* 144* 113* 110*  ALT 47 42 37 39  ALKPHOS 109 96 89 75  BILITOT 9.6* 8.8* 8.4* 8.9*  PROT 7.7 6.9 7.2 7.4  ALBUMIN 1.6* 1.4* 1.8* 2.3*    Recent Labs Lab 12/10/14 0956  LIPASE 58*   No results for input(s): AMMONIA in the last 168 hours. CBC:  Recent Labs Lab 12/10/14 0956 12/11/14 0613 12/12/14 0611  WBC 10.2 10.3 10.4  NEUTROABS 7.2  --   --   HGB 9.3* 8.4* 8.3*  HCT 28.8* 26.1* 25.4*  MCV 89.7 89.1 88.2  PLT 136* 125* 124*   Cardiac Enzymes: No results for input(s): CKTOTAL, CKMB, CKMBINDEX, TROPONINI in the last 168 hours. BNP (last 3 results)  Recent Labs  07/08/14 0912  BNP 22.0    ProBNP (last 3 results) No results for input(s): PROBNP in the last 8760 hours.  CBG: No results for input(s): GLUCAP in the last 168 hours.  No results found for this or any previous visit (from the past 240 hour(s)).   Studies: No results found.  Scheduled Meds: . albumin human  25 g Intravenous BID  .  folic acid  1 mg Oral Daily  . metolazone  2.5 mg Oral BID  . multivitamin with minerals  1 tablet Oral Daily  . pantoprazole  40 mg Oral QAC breakfast  . prednisoLONE  40 mg Oral QAC breakfast  . sodium chloride  3 mL Intravenous Q12H  . spironolactone  50 mg Oral Daily  . thiamine  100 mg Oral Daily  . torsemide  40 mg Oral Daily   Continuous Infusions:   Principal Problem:   AKI (acute kidney injury) ([place]) Active Problems:   Jaundice   Cirrhosis (HCC)   Elevated bilirubin   Volume overload   Urinary hesitancy   Ascites   Alcoholic hepatitis with ascites    Time spent: 25 minutes. Greater than 50% of this time was spent in direct contact with the patient coordinating care.    Lelon Frohlich  [facility] Pager  [phone]  If 7PM-7AM, please contact night-coverage at [email or web address], password [facility] 12/16/2014, 11:27 AM  LOS: 6 days

## 2014-12-16 NOTE — Telephone Encounter (Signed)
Lab order done and mailed to the pts home.

## 2014-12-17 ENCOUNTER — Ambulatory Visit: Payer: Self-pay | Admitting: Nurse Practitioner

## 2014-12-17 LAB — COMPREHENSIVE METABOLIC PANEL
ALK PHOS: 89 U/L (ref 38–126)
ALT: 54 U/L (ref 14–54)
AST: 121 U/L — AB (ref 15–41)
Albumin: 2.9 g/dL — ABNORMAL LOW (ref 3.5–5.0)
Anion gap: 9 (ref 5–15)
BUN: 38 mg/dL — ABNORMAL HIGH (ref 6–20)
CHLORIDE: 88 mmol/L — AB (ref 101–111)
CO2: 40 mmol/L — AB (ref 22–32)
Calcium: 10 mg/dL (ref 8.9–10.3)
Creatinine, Ser: 1.69 mg/dL — ABNORMAL HIGH (ref 0.44–1.00)
GFR calc Af Amer: 42 mL/min — ABNORMAL LOW (ref >60)
GFR, EST NON AFRICAN AMERICAN: 36 mL/min — AB (ref >60)
Glucose, Bld: 94 mg/dL (ref 65–99)
Potassium: 3.5 mmol/L (ref 3.5–5.1)
Sodium: 137 mmol/L (ref 135–145)
Total Bilirubin: 7.7 mg/dL — ABNORMAL HIGH (ref 0.3–1.2)
Total Protein: 8 g/dL (ref 6.5–8.1)

## 2014-12-17 NOTE — Progress Notes (Signed)
[facility] PROGRESS NOTE  Whitney Bush TUU:[id number] DOB: [birth year] DOA: 12/10/2014 PCP: [place]  Assessment/Plan: ARF -Likely prerenal vs ATN. -Renal function is slightly worsened. -Metalozone has been discontinued. -Recheck renal function in am. -Appreciate nephrology following.  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 -Is 11.8 L negative since admission and about 5.5 L negative overnight. -Is on aldactone, demadex and metolazone. -Still with significant volume overload on exam, altho improved from yesterday. -Renal has discontinued zaroxolyn due to rising Cr.  Positive Hep C Antibody; negative viral load   Code Status: Full Code Family Communication: Patient only  Disposition Plan: Home when ready; likely 24-48 hours.   Consultants:  Renal   Antibiotics:  None   Subjective: Feels like leg edema has improved some. Denies CP/SOB/abdominal pain.  Objective: Filed Vitals:   12/16/14 1353 12/16/14 2100 12/17/14 0529 12/17/14 1239  BP: 131/89 138/83 118/72 102/53  Pulse: 94 95 88 87  Temp: 99.7 F (37.6 C) 98.1 F (36.7 C) 98.2 F (36.8 C) 98.6 F (37 C)  TempSrc: Oral Oral Oral Oral  Resp: 18 18 18 18   Height:      Weight:   104.101 kg (229 lb 8 oz)   SpO2: 100% 100% 100% 100%    Intake/Output Summary (Last 24 hours) at 12/17/14 1336 Last data filed at 12/17/14 1200  Gross per 24 hour  Intake    960 ml  Output   3950 ml  Net  -2990 ml   Filed Weights   12/15/14 0600 12/16/14 0756 12/17/14 0529  Weight: 116.2 kg (256 lb 2.8 oz) 107.911 kg (237 lb 14.4 oz) 104.101 kg (229 lb 8 oz)    Exam:   General:  AA Ox3  Cardiovascular: RRR  Respiratory: CTA B  Abdomen: distended, +BS  Extremities: 2++ edema.   Neurologic:  Intact and non-focal  Data Reviewed: Basic Metabolic Panel:  Recent Labs Lab 12/13/14 0626 12/14/14 0639 12/15/14 0558  12/16/14 0845 12/17/14 0629  NA 134* 136 135 135 137  K 4.0 3.8 3.6 3.2* 3.5  CL 99* 99* 96* 89* 88*  CO2 25 29 29  34* 40*  GLUCOSE 101* 95 120* 144* 94  BUN 23* 25* 29* 31* 38*  CREATININE 1.59* 1.38* 1.42* 1.51* 1.69*  CALCIUM 8.1* 8.5* 8.9 9.9 10.0   Liver Function Tests:  Recent Labs Lab 12/11/14 0613 12/12/14 0611 12/14/14 0639 12/17/14 0629  AST 144* 113* 110* 121*  ALT 42 37 39 54  ALKPHOS 96 89 75 89  BILITOT 8.8* 8.4* 8.9* 7.7*  PROT 6.9 7.2 7.4 8.0  ALBUMIN 1.4* 1.8* 2.3* 2.9*   No results for input(s): LIPASE, AMYLASE in the last 168 hours. No results for input(s): AMMONIA in the last 168 hours. CBC:  Recent Labs Lab 12/11/14 0613 12/12/14 0611  WBC 10.3 10.4  HGB 8.4* 8.3*  HCT 26.1* 25.4*  MCV 89.1 88.2  PLT 125* 124*   Cardiac Enzymes: No results for input(s): CKTOTAL, CKMB, CKMBINDEX, TROPONINI in the last 168 hours. BNP (last 3 results)  Recent Labs  07/08/14 0912  BNP 22.0    ProBNP (last 3 results) No results for input(s): PROBNP in the last 8760 hours.  CBG: No results for input(s): GLUCAP in the last 168 hours.  No results found for this or any previous visit (from the past 240 hour(s)).   Studies: No results found.  Scheduled Meds: . folic acid  1 mg  Oral Daily  . multivitamin with minerals  1 tablet Oral Daily  . pantoprazole  40 mg Oral QAC breakfast  . prednisoLONE  40 mg Oral QAC breakfast  . sodium chloride  3 mL Intravenous Q12H  . spironolactone  50 mg Oral Daily  . thiamine  100 mg Oral Daily  . torsemide  40 mg Oral Daily   Continuous Infusions:   Principal Problem:   AKI (acute kidney injury) ([place]) Active Problems:   Jaundice   Cirrhosis (HCC)   Elevated bilirubin   Volume overload   Urinary hesitancy   Ascites   Alcoholic hepatitis with ascites    Time spent: 25 minutes. Greater than 50% of this time was spent in direct contact with the patient coordinating care.    Lelon Frohlich  [facility] Pager [phone]  If 7PM-7AM, please contact night-coverage at [email or web address], password [facility] 12/17/2014, 1:36 PM  LOS: 7 days

## 2014-12-17 NOTE — Progress Notes (Signed)
Subjective: No complaint. No nausea or vomiting. Denies difficult in breathing  Objective: Vital signs in last 24 hours: Temp:  [98.1 F (36.7 C)-99.7 F (37.6 C)] 98.2 F (36.8 C) (10/20 0529) Pulse Rate:  [88-95] 88 (10/20 0529) Resp:  [18] 18 (10/20 0529) BP: (118-138)/(72-89) 118/72 mmHg (10/20 0529) SpO2:  [100 %] 100 % (10/20 0529) Weight:  [229 lb 8 oz (104.101 kg)] 229 lb 8 oz (104.101 kg) (10/20 0529)  Intake/Output from previous day: 10/19 0701 - 10/20 0700 In: 820 [P.O.:720; IV Piggyback:100] Out: 3750 [Urine:3750] Intake/Output this shift:    No results for input(s): HGB in the last 72 hours. No results for input(s): WBC, RBC, HCT, PLT in the last 72 hours.  Recent Labs  12/16/14 0845 12/17/14 0629  NA 135 137  K 3.2* 3.5  CL 89* 88*  CO2 34* 40*  BUN 31* 38*  CREATININE 1.51* 1.69*  GLUCOSE 144* 94  CALCIUM 9.9 10.0   No results for input(s): LABPT, INR in the last 72 hours.  Generally patient is alert and in no apparent distress Chest is clear to auscultation Heart exam revealed regular rate and rhythm no murmur Abdomen: Obese, difficult to palpate organomegaly and nontender Extremities she has 2-3+ edema  Assessment/Plan: Problem #1 acute kidney injury: Possibly prerenal versus ATN. Her renal function is slightly worsening. Patient over all asymptomatic Problem #2 hyponatremia: Possibly hypervolemic hyponatremia . Her sodium has corrected.. Problem #3 anasarca: Presently she is on albumin/Aldactone/Lasix and patient had 5700 cc of urine out over the last 24 hours. Patient has lost about 16 kg since admission. Problem #4 liver cirrhosis Problem #5 hypertension: Her blood pressure is reasonably controlled Problem #6 anemia: Possibly iron deficiency anemia. Patient with heme positive stool and being followed bu GI Plan: 1] D/C Metolazone 2] Basic metabolic panel in am   [facility] S 12/17/2014, 8:06 AM

## 2014-12-17 NOTE — Care Management Note (Signed)
Case Management Note  Patient Details  Name: Whitney Bush MRN: [id number] Date of Birth: 07/25/1970  Subjective/Objective:                    Action/Plan:   Expected Discharge Date:  12/14/14               Expected Discharge Plan:  Home/Self Care  In-House Referral:  NA  Discharge planning Services  CM Consult  Post Acute Care Choice:  NA Choice offered to:  NA  DME Arranged:    DME Agency:     HH Arranged:    [place] Agency:     Status of Service:  Completed, signed off  Medicare Important Message Given:    Date Medicare IM Given:    Medicare IM give by:    Date Additional Medicare IM Given:    Additional Medicare Important Message give by:     If discussed at [place] of Stay Meetings, dates discussed:  12/17/2014  Additional Comments:  Sherald Barge, RN 12/17/2014, 12:35 PM

## 2014-12-18 DIAGNOSIS — K7031 Alcoholic cirrhosis of liver with ascites: Secondary | ICD-10-CM

## 2014-12-18 LAB — BASIC METABOLIC PANEL
Anion gap: 12 (ref 5–15)
BUN: 44 mg/dL — AB (ref 6–20)
CHLORIDE: 82 mmol/L — AB (ref 101–111)
CO2: 44 mmol/L — AB (ref 22–32)
CREATININE: 1.75 mg/dL — AB (ref 0.44–1.00)
Calcium: 10 mg/dL (ref 8.9–10.3)
GFR calc Af Amer: 40 mL/min — ABNORMAL LOW (ref >60)
GFR calc non Af Amer: 35 mL/min — ABNORMAL LOW (ref >60)
GLUCOSE: 99 mg/dL (ref 65–99)
Potassium: 3.9 mmol/L (ref 3.5–5.1)
Sodium: 138 mmol/L (ref 135–145)

## 2014-12-18 LAB — CBC
HCT: 25.9 % — ABNORMAL LOW (ref 36.0–46.0)
Hemoglobin: 8.5 g/dL — ABNORMAL LOW (ref 12.0–15.0)
MCH: 29.6 pg (ref 26.0–34.0)
MCHC: 32.8 g/dL (ref 30.0–36.0)
MCV: 90.2 fL (ref 78.0–100.0)
PLATELETS: 124 10*3/uL — AB (ref 150–400)
RBC: 2.87 MIL/uL — ABNORMAL LOW (ref 3.87–5.11)
RDW: 23.7 % — AB (ref 11.5–15.5)
WBC: 15.6 10*3/uL — ABNORMAL HIGH (ref 4.0–10.5)

## 2014-12-18 MED ORDER — TORSEMIDE 20 MG PO TABS
20.0000 mg | ORAL_TABLET | Freq: Every day | ORAL | Status: DC
  Administered 2014-12-19: 20 mg via ORAL
  Filled 2014-12-18: qty 1

## 2014-12-18 NOTE — Progress Notes (Addendum)
Subjective: Some flank pain other wise feels ok. She has also some headache last night and feeling better this morning  Objective: Vital signs in last 24 hours: Temp:  [98.2 F (36.8 C)-98.6 F (37 C)] 98.3 F (36.8 C) (10/21 0449) Pulse Rate:  [77-103] 103 (10/21 0449) Resp:  [18] 18 (10/21 0449) BP: (102-120)/(53-69) 116/69 mmHg (10/21 0449) SpO2:  [96 %-100 %] 96 % (10/21 0449) Weight:  [223 lb 3.2 oz (101.243 kg)] 223 lb 3.2 oz (101.243 kg) (10/21 0449)  Intake/Output from previous day: 10/20 0701 - 10/21 0700 In: 1200 [P.O.:1200] Out: 3850 [Urine:3850] Intake/Output this shift:     Recent Labs  12/18/14 0609  HGB 8.5*    Recent Labs  12/18/14 0609  WBC 15.6*  RBC 2.87*  HCT 25.9*  PLT 124*    Recent Labs  12/17/14 0629 12/18/14 0609  NA 137 138  K 3.5 3.9  CL 88* 82*  CO2 40* 44*  BUN 38* 44*  CREATININE 1.69* 1.75*  GLUCOSE 94 99  CALCIUM 10.0 10.0   No results for input(s): LABPT, INR in the last 72 hours.  Generally patient is alert and in no apparent distress Chest is clear to auscultation Heart exam revealed regular rate and rhythm no murmur Abdomen: Obese, difficult to palpate organomegaly and nontender Extremities she has trace to 1++ edema  Assessment/Plan: Problem #1 acute kidney injury: Possibly prerenal versus ATN. Her renal function is sliight worsening possibly from fluid removal Problem #2 hyponatremia: Possibly hypervolemic hyponatremia . Her sodium has corrected.. Problem #3 anasarca: Presently she is on Aldactone and Demadex.  patient had 3800 cc of urine out over the last 24 hours. Patient has lost about 18.5 kg since admission. Problem #4 liver cirrhosis Problem #5 hypertension: Her blood pressure is reasonably controlled Problem #6 anemia: Possibly iron deficiency anemia. Patient with heme positive stool and being followed by GI Plan: 1] Decrease Demadex to 20 mg po once a day and as needed at home 2] Basic metabolic panel in  am   [facility] S 12/18/2014, 8:02 AM

## 2014-12-18 NOTE — Progress Notes (Signed)
[facility] PROGRESS NOTE  Whitney Bush NOM:[id number] DOB: April 06, 1970 DOA: 12/10/2014 PCP: [place]  Assessment/Plan: ARF -Likely prerenal vs ATN. -Renal function is slightly worsened. -Metalozone has been discontinued, torsemide dose decreased. -Recheck renal function in am. -Appreciate nephrology following.  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 -Is 13.8 L negative since admission and about 2 L negative overnight. -Continue aldactone, demadex at reduced dose. Metalozone discontinued. -Volume status improving.  Positive Hep C Antibody; negative viral load   Code Status: Full Code Family Communication: Patient only  Disposition Plan: Home when ready; likely 24-48 hours.   Consultants:  Renal   Antibiotics:  None   Subjective: Feels like leg edema has improved some. Denies CP/SOB/abdominal pain.  Objective: Filed Vitals:   12/17/14 0529 12/17/14 1239 12/17/14 2145 12/18/14 0449  BP: 118/72 102/53 120/68 116/69  Pulse: 88 87 77 103  Temp: 98.2 F (36.8 C) 98.6 F (37 C) 98.2 F (36.8 C) 98.3 F (36.8 C)  TempSrc: Oral Oral Oral Oral  Resp: 18 18 18 18   Height:      Weight: 104.101 kg (229 lb 8 oz)   101.243 kg (223 lb 3.2 oz)  SpO2: 100% 100% 100% 96%    Intake/Output Summary (Last 24 hours) at 12/18/14 1348 Last data filed at 12/18/14 0853  Gross per 24 hour  Intake   1080 ml  Output   3100 ml  Net  -2020 ml   Filed Weights   12/16/14 0756 12/17/14 0529 12/18/14 0449  Weight: 107.911 kg (237 lb 14.4 oz) 104.101 kg (229 lb 8 oz) 101.243 kg (223 lb 3.2 oz)    Exam:   General:  AA Ox3  Cardiovascular: RRR  Respiratory: CTA B  Abdomen: distended, +BS  Extremities: 2++ edema.   Neurologic:  Intact and non-focal  Data Reviewed: Basic Metabolic Panel:  Recent Labs Lab 12/14/14 0639 12/15/14 0558 12/16/14 0845 12/17/14 0629 12/18/14 0609  NA 136  135 135 137 138  K 3.8 3.6 3.2* 3.5 3.9  CL 99* 96* 89* 88* 82*  CO2 29 29 34* 40* 44*  GLUCOSE 95 120* 144* 94 99  BUN 25* 29* 31* 38* 44*  CREATININE 1.38* 1.42* 1.51* 1.69* 1.75*  CALCIUM 8.5* 8.9 9.9 10.0 10.0   Liver Function Tests:  Recent Labs Lab 12/12/14 0611 12/14/14 0639 12/17/14 0629  AST 113* 110* 121*  ALT 37 39 54  ALKPHOS 89 75 89  BILITOT 8.4* 8.9* 7.7*  PROT 7.2 7.4 8.0  ALBUMIN 1.8* 2.3* 2.9*   No results for input(s): LIPASE, AMYLASE in the last 168 hours. No results for input(s): AMMONIA in the last 168 hours. CBC:  Recent Labs Lab 12/12/14 0611 12/18/14 0609  WBC 10.4 15.6*  HGB 8.3* 8.5*  HCT 25.4* 25.9*  MCV 88.2 90.2  PLT 124* 124*   Cardiac Enzymes: No results for input(s): CKTOTAL, CKMB, CKMBINDEX, TROPONINI in the last 168 hours. BNP (last 3 results)  Recent Labs  07/08/14 0912  BNP 22.0    ProBNP (last 3 results) No results for input(s): PROBNP in the last 8760 hours.  CBG: No results for input(s): GLUCAP in the last 168 hours.  No results found for this or any previous visit (from the past 240 hour(s)).   Studies: No results found.  Scheduled Meds: . folic acid  1 mg Oral Daily  . multivitamin with minerals  1 tablet Oral Daily  . pantoprazole  40 mg Oral QAC breakfast  . prednisoLONE  40 mg Oral QAC breakfast  . sodium chloride  3 mL Intravenous Q12H  . spironolactone  50 mg Oral Daily  . thiamine  100 mg Oral Daily  . [START ON 12/19/2014] torsemide  20 mg Oral Daily   Continuous Infusions:   Principal Problem:   AKI (acute kidney injury) ([place]) Active Problems:   Jaundice   Cirrhosis (HCC)   Elevated bilirubin   Volume overload   Urinary hesitancy   Ascites   Alcoholic hepatitis with ascites    Time spent: 15 minutes. Greater than 50% of this time was spent in direct contact with the patient coordinating care.    Lelon Frohlich  [facility] Pager [phone]  If 7PM-7AM, please  contact night-coverage at [email or web address], password [facility] 12/18/2014, 1:48 PM  LOS: 8 days

## 2014-12-18 NOTE — Care Management Note (Signed)
Case Management Note  Patient Details  Name: YANELIS OSIKA MRN: [id number] Date of Birth: [birth year]  Subjective/Objective:                    Action/Plan:   Expected Discharge Date:  12/14/14               Expected Discharge Plan:  Home/Self Care  In-House Referral:  NA  Discharge planning Services  CM Consult, Follow-up appt scheduled  Post Acute Care Choice:  NA Choice offered to:  NA  DME Arranged:    DME Agency:     HH Arranged:    [place] Agency:     Status of Service:  Completed, signed off  Medicare Important Message Given:    Date Medicare IM Given:    Medicare IM give by:    Date Additional Medicare IM Given:    Additional Medicare Important Message give by:     If discussed at [place] of Stay Meetings, dates discussed:    Additional Comments: Anticipate discharge over the weekend. Follow up appt for [facility] scheduled and documented on AVS. Pt aware as well. Pt is aware that she is not eligible for MATCH at discharge. Christinia Gully [place], RN 12/18/2014, 10:09 AM

## 2014-12-19 LAB — BASIC METABOLIC PANEL
Anion gap: 13 (ref 5–15)
BUN: 53 mg/dL — AB (ref 6–20)
CALCIUM: 9.6 mg/dL (ref 8.9–10.3)
CO2: 43 mmol/L — ABNORMAL HIGH (ref 22–32)
Chloride: 80 mmol/L — ABNORMAL LOW (ref 101–111)
Creatinine, Ser: 1.92 mg/dL — ABNORMAL HIGH (ref 0.44–1.00)
GFR calc Af Amer: 36 mL/min — ABNORMAL LOW (ref >60)
GFR, EST NON AFRICAN AMERICAN: 31 mL/min — AB (ref >60)
GLUCOSE: 131 mg/dL — AB (ref 65–99)
Potassium: 3.5 mmol/L (ref 3.5–5.1)
Sodium: 136 mmol/L (ref 135–145)

## 2014-12-19 NOTE — Progress Notes (Signed)
[facility] PROGRESS NOTE  Whitney Bush ZOX:[id number] DOB: 1970/05/08 DOA: 12/10/2014 PCP: [place]  Assessment/Plan: ARF -Likely prerenal vs ATN. -Renal function is slightly worsened, Cr up to 1.9 today. -Metalozone has been discontinued, torsemide is at reduced dose. -Recheck renal function in am. -Appreciate nephrology following.  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 -Is 17.4 L negative since admission and about 2 L negative overnight.  -Continue aldactone, demadex at reduced dose. Metalozone discontinued. -Volume status improving.  Positive Hep C Antibody; negative viral load   Code Status: Full Code Family Communication: Patient only  Disposition Plan: Home when ready; likely 24-48 hours.   Consultants:  Nephrology.  Antibiotics:  None   Subjective: Feels much better. Continues with good UOP and feels like her swelling has improved. Denies any abdominal pain .  Objective: Filed Vitals:   12/18/14 0449 12/18/14 1532 12/18/14 2149 12/19/14 0646  BP: 116/69 131/73 127/73 108/56  Pulse: 103 101 92 97  Temp: 98.3 F (36.8 C)  97.9 F (36.6 C) 99.4 F (37.4 C)  TempSrc: Oral  Oral Oral  Resp: 18 18 18 18   Height:      Weight: 101.243 kg (223 lb 3.2 oz)   99.338 kg (219 lb)  SpO2: 96% 99% 100% 98%    Intake/Output Summary (Last 24 hours) at 12/19/14 0741 Last data filed at 12/19/14 0651  Gross per 24 hour  Intake    360 ml  Output   1700 ml  Net  -1340 ml   Filed Weights   12/17/14 0529 12/18/14 0449 12/19/14 0646  Weight: 104.101 kg (229 lb 8 oz) 101.243 kg (223 lb 3.2 oz) 99.338 kg (219 lb)    Exam:   General:  AA Ox3  Cardiovascular: RRR  Respiratory: CTA B  Abdomen: S/NT/ND/ +BS  Extremities: 1+ edema BLE.   Neurologic:  Intact and non-focal  Data Reviewed: Basic Metabolic Panel:  Recent Labs Lab 12/14/14 0639 12/15/14 0558 12/16/14 0845  12/17/14 0629 12/18/14 0609  NA 136 135 135 137 138  K 3.8 3.6 3.2* 3.5 3.9  CL 99* 96* 89* 88* 82*  CO2 29 29 34* 40* 44*  GLUCOSE 95 120* 144* 94 99  BUN 25* 29* 31* 38* 44*  CREATININE 1.38* 1.42* 1.51* 1.69* 1.75*  CALCIUM 8.5* 8.9 9.9 10.0 10.0   Liver Function Tests:  Recent Labs Lab 12/14/14 0639 12/17/14 0629  AST 110* 121*  ALT 39 54  ALKPHOS 75 89  BILITOT 8.9* 7.7*  PROT 7.4 8.0  ALBUMIN 2.3* 2.9*   CBC:  Recent Labs Lab 12/18/14 0609  WBC 15.6*  HGB 8.5*  HCT 25.9*  MCV 90.2  PLT 124*   BNP (last 3 results)  Recent Labs  07/08/14 0912  BNP 22.0    Scheduled Meds: . folic acid  1 mg Oral Daily  . multivitamin with minerals  1 tablet Oral Daily  . pantoprazole  40 mg Oral QAC breakfast  . prednisoLONE  40 mg Oral QAC breakfast  . sodium chloride  3 mL Intravenous Q12H  . spironolactone  50 mg Oral Daily  . thiamine  100 mg Oral Daily  . torsemide  20 mg Oral Daily   Continuous Infusions:   Principal Problem:   AKI (acute kidney injury) ([place]) Active Problems:   Jaundice   Cirrhosis (HCC)   Elevated bilirubin   Volume overload   Urinary hesitancy   Ascites  Alcoholic hepatitis with ascites    Time spent: 20 minutes. Greater than 50% of this time was spent in direct contact with the patient coordinating care.   Domingo Mend, MD [facility] Pager [phone]  If 7PM-7AM, please contact night-coverage at [email or web address], password [email or web address] 12/19/2014, 7:41 AM  LOS: 9 days    By signing my name below, I, Rosalie Doctor, attest that this documentation has been prepared under the direction and in the presence of Domingo Mend, MD Electronically Signed: Rosalie Doctor, Scribe. 12/19/2014 11:55am   I have reviewed the above documentation for accuracy and completeness, and I agree with the above.  Domingo Mend, MD [facility] Pager: [phone]

## 2014-12-19 NOTE — Progress Notes (Signed)
Subjective: Patient feels much better today. Her headache has improved but had low grade fever this morning  Objective: Vital signs in last 24 hours: Temp:  [97.9 F (36.6 C)-99.4 F (37.4 C)] 99.4 F (37.4 C) (10/22 0646) Pulse Rate:  [92-101] 97 (10/22 0646) Resp:  [18] 18 (10/22 0646) BP: (108-131)/(56-73) 108/56 mmHg (10/22 0646) SpO2:  [98 %-100 %] 98 % (10/22 0646) Weight:  [219 lb (99.338 kg)] 219 lb (99.338 kg) (10/22 0646)  Intake/Output from previous day: 10/21 0701 - 10/22 0700 In: 360 [P.O.:360] Out: 1700 [Urine:1700] Intake/Output this shift: Total I/O In: 240 [P.O.:240] Out: -    Recent Labs  12/18/14 0609  HGB 8.5*    Recent Labs  12/18/14 0609  WBC 15.6*  RBC 2.87*  HCT 25.9*  PLT 124*    Recent Labs  12/17/14 0629 12/18/14 0609  NA 137 138  K 3.5 3.9  CL 88* 82*  CO2 40* 44*  BUN 38* 44*  CREATININE 1.69* 1.75*  GLUCOSE 94 99  CALCIUM 10.0 10.0   No results for input(s): LABPT, INR in the last 72 hours.  Generally patient is alert and in no apparent distress Chest is clear to auscultation Heart exam revealed regular rate and rhythm no murmur Abdomen: Obese, difficult to palpate organomegaly and nontender Extremities she has trace  edema  Assessment/Plan: Problem #1 acute kidney injury: Possibly prerenal versus ATN. Her BUN and creatinine has increased slightly other wise over stable Problem #2 hyponatremia: Possibly hypervolemic hyponatremia . Her sodium has corrected.. Problem #3 anasarca: Presently she is on Aldactone/Demadex and patient had 1700 cc of urine out over the last 24 hours. None oliguric and presently her urine out put has declined as the dose of Demadex was cut . Problem #4 liver cirrhosis Problem #5 hypertension: Her blood pressure is reasonably controlled Problem #6 anemia: Possibly iron deficiency anemia. Patient with heme positive stool  Plan: 1]continue with present treatment 2] Basic metabolic panel in  am   [facility] S 12/19/2014, 9:08 AM

## 2014-12-20 LAB — COMPREHENSIVE METABOLIC PANEL
ALK PHOS: 91 U/L (ref 38–126)
ALT: 73 U/L — ABNORMAL HIGH (ref 14–54)
ANION GAP: 14 (ref 5–15)
AST: 142 U/L — AB (ref 15–41)
Albumin: 2.9 g/dL — ABNORMAL LOW (ref 3.5–5.0)
BUN: 58 mg/dL — ABNORMAL HIGH (ref 6–20)
CHLORIDE: 82 mmol/L — AB (ref 101–111)
CO2: 40 mmol/L — AB (ref 22–32)
Calcium: 9.3 mg/dL (ref 8.9–10.3)
Creatinine, Ser: 1.88 mg/dL — ABNORMAL HIGH (ref 0.44–1.00)
GFR calc non Af Amer: 32 mL/min — ABNORMAL LOW (ref >60)
GFR, EST AFRICAN AMERICAN: 37 mL/min — AB (ref >60)
GLUCOSE: 113 mg/dL — AB (ref 65–99)
Potassium: 3.5 mmol/L (ref 3.5–5.1)
Sodium: 136 mmol/L (ref 135–145)
Total Bilirubin: 7.5 mg/dL — ABNORMAL HIGH (ref 0.3–1.2)
Total Protein: 8.7 g/dL — ABNORMAL HIGH (ref 6.5–8.1)

## 2014-12-20 NOTE — Progress Notes (Signed)
[facility] PROGRESS NOTE  Whitney Bush KVQ:[id number] DOB: 09-Aug-1970 DOA: 12/10/2014 PCP: [facility]  Assessment/Plan: ARF -Likely prerenal vs ATN. -Renal function is slightly improved today. -Metalozone/torsemide have been discontinued. -Recheck renal function in am. -Appreciate nephrology following.  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 -Is 19.4 L negative since admission and about 2 L negative overnight.  She weighs about 20 kg less than admission. -Continue aldactone only. -Volume status improving.  Positive Hep C Antibody; negative viral load   Code Status: Full Code Family Communication: Patient only  Disposition Plan: Home when ready; likely 24-48 hours.   Consultants:  Nephrology.  Antibiotics:  None   Subjective: Feels much better. Continues with good UOP and feels like her swelling has improved. Denies any abdominal pain .  Objective: Filed Vitals:   12/19/14 1514 12/19/14 2139 12/20/14 0543 12/20/14 0545  BP: 116/73 102/69 121/68   Pulse: 101 94 96   Temp: 98.7 F (37.1 C) 98.9 F (37.2 C) 99.3 F (37.4 C)   TempSrc: Oral Oral Oral   Resp: 18 18 20    Height:      Weight:    99.383 kg (219 lb 1.6 oz)  SpO2: 100% 99% 100%     Intake/Output Summary (Last 24 hours) at 12/20/14 1313 Last data filed at 12/20/14 0800  Gross per 24 hour  Intake    600 ml  Output   3100 ml  Net  -2500 ml   Filed Weights   12/18/14 0449 12/19/14 0646 12/20/14 0545  Weight: 101.243 kg (223 lb 3.2 oz) 99.338 kg (219 lb) 99.383 kg (219 lb 1.6 oz)    Exam:   General:  AA Ox3  Cardiovascular: RRR  Respiratory: CTA B  Abdomen: S/NT/ND/ +BS  Extremities: 1+ edema BLE.   Neurologic:  Intact and non-focal  Data Reviewed: Basic Metabolic Panel:  Recent Labs Lab 12/16/14 0845 12/17/14 0629 12/18/14 0609 12/19/14 1212 12/20/14 0920  NA 135 137 138 136 136  K 3.2*  3.5 3.9 3.5 3.5  CL 89* 88* 82* 80* 82*  CO2 34* 40* 44* 43* 40*  GLUCOSE 144* 94 99 131* 113*  BUN 31* 38* 44* 53* 58*  CREATININE 1.51* 1.69* 1.75* 1.92* 1.88*  CALCIUM 9.9 10.0 10.0 9.6 9.3   Liver Function Tests:  Recent Labs Lab 12/14/14 0639 12/17/14 0629 12/20/14 0920  AST 110* 121* 142*  ALT 39 54 73*  ALKPHOS 75 89 91  BILITOT 8.9* 7.7* 7.5*  PROT 7.4 8.0 8.7*  ALBUMIN 2.3* 2.9* 2.9*   CBC:  Recent Labs Lab 12/18/14 0609  WBC 15.6*  HGB 8.5*  HCT 25.9*  MCV 90.2  PLT 124*   BNP (last 3 results)  Recent Labs  07/08/14 0912  BNP 22.0    Scheduled Meds: . folic acid  1 mg Oral Daily  . multivitamin with minerals  1 tablet Oral Daily  . pantoprazole  40 mg Oral QAC breakfast  . prednisoLONE  40 mg Oral QAC breakfast  . sodium chloride  3 mL Intravenous Q12H  . spironolactone  50 mg Oral Daily  . thiamine  100 mg Oral Daily   Continuous Infusions:   Principal Problem:   AKI (acute kidney injury) ([place]) Active Problems:   Jaundice   Cirrhosis (HCC)   Elevated bilirubin   Volume overload   Urinary hesitancy   Ascites   Alcoholic hepatitis with ascites    Time  spent: 15 minutes. Greater than 50% of this time was spent in direct contact with the patient coordinating care.   Domingo Mend, MD [facility] Pager [phone]  If 7PM-7AM, please contact night-coverage at [email or web address], password [facility] 12/20/2014, 1:13 PM  LOS: 10 days

## 2014-12-20 NOTE — Progress Notes (Signed)
Subjective: No new complaint. She feels better  Objective: Vital signs in last 24 hours: Temp:  [98.7 F (37.1 C)-99.3 F (37.4 C)] 99.3 F (37.4 C) (10/23 0543) Pulse Rate:  [94-101] 96 (10/23 0543) Resp:  [18-20] 20 (10/23 0543) BP: (102-121)/(68-73) 121/68 mmHg (10/23 0543) SpO2:  [99 %-100 %] 100 % (10/23 0543) Weight:  [219 lb 1.6 oz (99.383 kg)] 219 lb 1.6 oz (99.383 kg) (10/23 0545)  Intake/Output from previous day: 10/22 0701 - 10/23 0700 In: 480 [P.O.:480] Out: 3100 [Urine:3100] Intake/Output this shift: Total I/O In: 360 [P.O.:360] Out: -    Recent Labs  12/18/14 0609  HGB 8.5*    Recent Labs  12/18/14 0609  WBC 15.6*  RBC 2.87*  HCT 25.9*  PLT 124*    Recent Labs  12/18/14 0609 12/19/14 1212  NA 138 136  K 3.9 3.5  CL 82* 80*  CO2 44* 43*  BUN 44* 53*  CREATININE 1.75* 1.92*  GLUCOSE 99 131*  CALCIUM 10.0 9.6   No results for input(s): LABPT, INR in the last 72 hours.  Generally patient is alert and in no apparent distress Chest is clear to auscultation Heart exam revealed regular rate and rhythm no murmur Abdomen: Obese, difficult to palpate organomegaly and nontender Extremities she has trace  edema  Assessment/Plan: Problem #1 acute kidney injury: Possibly prerenal versus ATN. Her renal function is slightly worsening possibly from fluid removal Problem #2 hyponatremia:   Her sodium is normal Problem #3 anasarca: Presently she is on Aldactone/Demadex and patient had 3100 cc of urine out over the last 24 hours. Her edema has improved significantly. Patient has lost about 42 lbs since her admission Problem #4 liver cirrhosis Problem #5 hypertension: Her blood pressure is reasonably controlled Problem #6 anemia: Possibly iron deficiency anemia.  Plan: 1] D/C Demadex and continue with Aldactone 2] Basic metabolic panel in am   [facility] S 12/20/2014, 8:57 AM

## 2014-12-21 LAB — BASIC METABOLIC PANEL
Anion gap: 11 (ref 5–15)
BUN: 57 mg/dL — AB (ref 6–20)
CHLORIDE: 89 mmol/L — AB (ref 101–111)
CO2: 36 mmol/L — AB (ref 22–32)
CREATININE: 1.75 mg/dL — AB (ref 0.44–1.00)
Calcium: 8.9 mg/dL (ref 8.9–10.3)
GFR calc Af Amer: 40 mL/min — ABNORMAL LOW (ref >60)
GFR calc non Af Amer: 35 mL/min — ABNORMAL LOW (ref >60)
Glucose, Bld: 129 mg/dL — ABNORMAL HIGH (ref 65–99)
Potassium: 3.3 mmol/L — ABNORMAL LOW (ref 3.5–5.1)
Sodium: 136 mmol/L (ref 135–145)

## 2014-12-21 MED ORDER — SPIRONOLACTONE 50 MG PO TABS: 50.0000 mg | ORAL_TABLET | Freq: Every day | ORAL | Status: DC

## 2014-12-21 MED ORDER — POTASSIUM CHLORIDE CRYS ER 20 MEQ PO TBCR
40.0000 meq | EXTENDED_RELEASE_TABLET | Freq: Once | ORAL | Status: AC
  Administered 2014-12-21: 40 meq via ORAL
  Filled 2014-12-21: qty 2

## 2014-12-21 NOTE — Care Management Note (Signed)
Case Management Note  Patient Details  Name: Whitney Bush MRN: [id number] Date of Birth: 29-May-1970  Subjective/Objective:                    Action/Plan:   Expected Discharge Date:  12/14/14               Expected Discharge Plan:  Home/Self Care  In-House Referral:  NA  Discharge planning Services  CM Consult, Follow-up appt scheduled  Post Acute Care Choice:  NA Choice offered to:  NA  DME Arranged:    DME Agency:     HH Arranged:    [place] Agency:     Status of Service:  Completed, signed off  Medicare Important Message Given:    Date Medicare IM Given:    Medicare IM give by:    Date Additional Medicare IM Given:    Additional Medicare Important Message give by:     If discussed at [place] of Stay Meetings, dates discussed:    Additional Comments: Pt discharged home today. Pt has follow up with [facility] in place and pt is aware of new appt time. No MATCH given since pt has had MATCH within the last couple of weeks. Pt and pts nurse aware of discharge arrangements. Christinia Gully Jess[place], RN 12/21/2014, 3:30 PM

## 2014-12-21 NOTE — Final Progress Note (Signed)
Patient discharged with instructions, prescription, and care notes.  Verbalized understanding via teach back.  IV was removed and the site was WNL. Patient voiced no further complaints or concerns at the time of discharge.  Appointments scheduled per instructions.  Patient left the floor via w/c with staff and family in stable condition. 

## 2014-12-21 NOTE — Progress Notes (Signed)
Subjective: No new complaint. Denies any difficulty in brathing  Objective: Vital signs in last 24 hours: Temp:  [98.4 F (36.9 C)-98.6 F (37 C)] 98.5 F (36.9 C) (10/24 0622) Pulse Rate:  [95-99] 99 (10/24 0622) Resp:  [20] 20 (10/24 0622) BP: (119-124)/(63-72) 123/72 mmHg (10/24 0622) SpO2:  [98 %-100 %] 100 % (10/24 0622) Weight:  [218 lb 6.4 oz (99.066 kg)] 218 lb 6.4 oz (99.066 kg) (10/24 0622)  Intake/Output from previous day: 10/23 0701 - 10/24 0700 In: 1080 [P.O.:1080] Out: 3000 [Urine:3000] Intake/Output this shift:    No results for input(s): HGB in the last 72 hours. No results for input(s): WBC, RBC, HCT, PLT in the last 72 hours.  Recent Labs  12/20/14 0920 12/21/14 0643  NA 136 136  K 3.5 3.3*  CL 82* 89*  CO2 40* 36*  BUN 58* 57*  CREATININE 1.88* 1.75*  GLUCOSE 113* 129*  CALCIUM 9.3 8.9   No results for input(s): LABPT, INR in the last 72 hours.  Generally patient is alert and in no apparent distress Chest is clear to auscultation Heart exam revealed regular rate and rhythm no murmur Abdomen: Obese, difficult to palpate organomegaly and nontender Extremities she has trace  edema  Assessment/Plan: Problem #1 acute kidney injury: Possibly prerenal versus ATN. Her renal function is better today but her potassium has declined Problem #2 hyponatremia:   Her sodium is normal Problem #3 anasarca: Presently she is on Aldactone and patient had 3000 cc of urine out over the last 24 hours. No difficulty in breathing Problem #4 liver cirrhosis Problem #5 hypertension: Her blood pressure is reasonably controlled Problem #6 anemia:  Plan: 1] continue with Aldactone 2] Kcl 40 meq po one dose 3] I will see in the offoce   [facility] S 12/21/2014, 9:58 AM

## 2014-12-21 NOTE — Discharge Summary (Signed)
Physician Discharge Summary  Whitney Bush [id number]:[id number] DOB: 17-Mar-1970 DOA: 12/10/2014  PCP: [facility] date: 12/10/2014 Discharge date: 12/21/2014  Time spent: 45 minutes  Recommendations for Outpatient Follow-up:  -Will be discharged home today. -Advised to follow up with PCP in 3 days om 10/27 as scheduled to recheck renal function.   Discharge Diagnoses:  Principal Problem:   AKI (acute kidney injury) ([place]) Active Problems:   Jaundice   Cirrhosis ([place])   Elevated bilirubin   Volume overload   Urinary hesitancy   Ascites   Alcoholic hepatitis with ascites   Discharge Condition: Stable and improved  Filed Weights   12/19/14 0646 12/20/14 0545 12/21/14 0622  Weight: 99.338 kg (219 lb) 99.383 kg (219 lb 1.6 oz) 99.066 kg (218 lb 6.4 oz)    History of present illness:  As per Dr. Sarajane Jews 32/38: 44 year old woman PMH recently diagnosed cirrhosis secondary to alcohol abuse who presented to the emergency department with increasing lower extremity edema. Initial evaluation suggested acute kidney injury and volume overload she was referred for admission.  Patient was discharged October 8 is being treated for cirrhosis and seen by gastroenterology. Prednisolone was considered but deferred while waiting for results of hepatitis C studies. She was also noted to have anemia and was transfused blood. Plans were made for outpatient follow-up with gastroenterology.  The patient reports compliance with her usual antihypertensive and HCTZ however she no significant weight gain and fluid retention especially in her legs now extending to her abdomens. This is the primary reason she came to the hospital. She has had a small amount of blood when she has a bowel movement. She also has had difficulty urinating and emptying her bladder. This seems to have come on in the last week or so. She denies any alcohol use for the last 2 weeks.  In the emergency  department afebrile, vital signs stable, no hypoxia. Pertinent labs: Creatinine up to 1.82. Sodium stable 1:30. No change in bilirubin, 9.6. Platelet count stable 136. Hemoglobin 9.3. EKG: Independently reviewed. Sinus rhythm, no acute changes. Imaging: Chest x-ray no acute disease. Ultrasound showed moderate ascites within the pelvis.  Hospital Course:   ARF -Likely prerenal vs ATN. -Renal function is slightly improved today. -Metalozone/torsemide have been discontinued. -Continue aldactone only. -Appreciate nephrology following.  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 -Is 21.5 L negative since admission and about 2 L negative overnight. She weighs about 22 kg less than admission. -Continue aldactone only. -Volume status improving.  Positive Hep C Antibody; negative viral load   Procedures:  None   Consultations:  Renal, Dr. Lowanda Foster  Discharge Instructions  Discharge Instructions    Diet - low sodium heart healthy    Complete by:  As directed      Increase activity slowly    Complete by:  As directed             Medication List    STOP taking these medications        lisinopril-hydrochlorothiazide 20-25 MG tablet  Commonly known as:  PRINZIDE,ZESTORETIC     naproxen sodium 220 MG tablet  Commonly known as:  ANAPROX      TAKE these medications        folic acid 1 MG tablet  Commonly known as:  FOLVITE  Take 1 tablet (1 mg total) by mouth daily.     multivitamin with minerals Tabs tablet  Take 1  tablet by mouth daily.     pantoprazole 40 MG tablet  Commonly known as:  PROTONIX  Take 1 tablet (40 mg total) by mouth daily before breakfast.     prednisoLONE 10 MG disintegrating tablet  Commonly known as:  ORAPRED ODT  Take 40 mg a day for 28 days. Then take 30 mg a day for 5 days, then 20 mg a day for 5 days, then 20 mg a day for 5 days. Then stop.     spironolactone 50 MG tablet  Commonly known  as:  ALDACTONE  Take 1 tablet (50 mg total) by mouth daily.     thiamine 100 MG tablet  Take 1 tablet (100 mg total) by mouth daily.       No Known Allergies     Follow-up Information    Follow up with Manus Rudd, MD On 01/04/2015.   Specialty:  Gastroenterology   Why:  at 10:30 am   Contact information:   [address] [place] [address] [phone]       Follow up with [facility] S, MD In 1 week.   Specialty:  Nephrology   Why:  Basic metabolic panel before coming to his office for follow-up.   Contact information:   [address] [address] [phone]       Follow up with Alphia Kava On 12/24/2014.   Why:  at 11:30   Contact information:   [place] [address] [phone]        The results of significant diagnostics from this hospitalization (including imaging, microbiology, ancillary and laboratory) are listed below for reference.    Significant Diagnostic Studies: Ct Abdomen Pelvis Wo Contrast  12/02/2014  CLINICAL DATA:  Nausea for 2 weeks. Elevated bilirubin level. Jaundice. EXAM: CT ABDOMEN AND PELVIS WITHOUT CONTRAST TECHNIQUE: Multidetector CT imaging of the abdomen and pelvis was performed following the standard protocol without IV contrast. COMPARISON:  None. FINDINGS: Lack of intravenous contrast severely limits the examination. The liver is heterogeneous. Detail is obscured by a lack of intravenous contrast. Gallbladder, spleen, pancreas, adrenal glands, and kidneys are within normal limits. The uterus is lobulated worrisome for fibroids. Bladder is decompressed. There is a small amount of free fluid scattered throughout the peritoneal space. There is stranding throughout the peritoneal fat as well as the subcutaneous fat worrisome for anasarca. Bowel is decompressed.  There is no free intraperitoneal gas. No vertebral compression deformity. Tiny left pleural effusion. IMPRESSION: The liver  is diffusely heterogeneous which may be related to diffuse hepatic parenchymal disease or diffuse infiltration by tumor. Contrast enhanced CT or MRI of the liver is recommended. Free-fluid Diffuse edema. Tiny left pleural effusion. Uterine fibroids. Electronically Signed   By: Marybelle Killings M.D.   On: 12/02/2014 18:03   Ct Abdomen Pelvis W Contrast  12/03/2014  CLINICAL DATA:  History of cirrhosis with elevated LFTs EXAM: CT ABDOMEN AND PELVIS WITH CONTRAST TECHNIQUE: Multidetector CT imaging of the abdomen and pelvis was performed using the standard protocol following bolus administration of intravenous contrast. CONTRAST:  114mL OMNIPAQUE IOHEXOL 300 MG/ML  SOLN COMPARISON:  12/02/2014 FINDINGS: Lung bases are free of acute infiltrate or sizable effusion. The liver is again prominent but diffusely decreased in attenuation throughout all 3 phases of the contrast enhancement. This is most noted in the dome of the liver in the right lobe although no discrete mass lesion is noted. This likely represents a degree of fatty infiltration but  also likely related to the underlying cirrhotic change. The spleen, adrenal glands, pancreas and kidneys are within normal limits. The gallbladder is unremarkable. The appendix is well visualized and within normal limits. Mild ascites is seen. The bladder is decompressed. The uterus is prominent with fibroid change. No ovarian mass lesion is noted. No vascular abnormality is seen. No inflammatory changes of the bowel are seen. Mild anasarca is noted within the subcutaneous soft tissues. IMPRESSION: Changes of mild ascites and anasarca similar to that seen on the prior exam. Diffuse irregular decreased attenuation of the liver as described. This likely represents a combination of fatty liver and underlying cirrhotic change. No discrete mass lesion is noted. If clinically indicated a contrast enhanced MRI of the liver would be best for evaluation of small lesions. This would be best  performed as an outpatient when the patient's condition improves. The remainder of the exam is stable from the prior study. Electronically Signed   By: Inez Catalina M.D.   On: 12/03/2014 18:45   US Abdomen Limited  12/10/2014  CLINICAL DATA:  Abdominal swelling EXAM: LIMITED ABDOMEN ULTRASOUND FOR ASCITES TECHNIQUE: Limited ultrasound survey for ascites was performed in all four abdominal quadrants. COMPARISON:  CT 12/03/2014 FINDINGS: Minimal fluid in the RIGHT upper quadrant and LEFT upper quadrant. Moderate volume ascites within the pelvis midline. IMPRESSION: Moderate volume of ascites within the pelvis. Electronically Signed   By: Suzy Bouchard M.D.   On: 12/10/2014 13:02   Dg Chest Port 1 View  12/10/2014  CLINICAL DATA:  Shortness of breath EXAM: PORTABLE CHEST 1 VIEW COMPARISON:  12/02/2014 FINDINGS: Low volume chest without pneumonia or edema. No effusion or air leak. Normal heart size and aortic contours. IMPRESSION: Negative low volume portable chest. Electronically Signed   By: Monte Fantasia M.D.   On: 12/10/2014 10:16   Dg Abd Acute W/chest  12/02/2014  CLINICAL DATA:  Generalized abdominal pain for several weeks. EXAM: DG ABDOMEN ACUTE W/ 1V CHEST COMPARISON:  Jul 08, 2014. FINDINGS: There is no evidence of dilated bowel loops or free intraperitoneal air. No radiopaque calculi or other significant radiographic abnormality is seen. Heart size and mediastinal contours are within normal limits. Both lungs are clear. IMPRESSION: No evidence of bowel obstruction or ileus. No acute cardiopulmonary disease. Electronically Signed   By: Marijo Conception, M.D.   On: 12/02/2014 13:54    Microbiology: No results found for this or any previous visit (from the past 240 hour(s)).   Labs: Basic Metabolic Panel:  Recent Labs Lab 12/17/14 0629 12/18/14 0609 12/19/14 1212 12/20/14 0920 12/21/14 0643  NA 137 138 136 136 136  K 3.5 3.9 3.5 3.5 3.3*  CL 88* 82* 80* 82* 89*  CO2 40* 44* 43*  40* 36*  GLUCOSE 94 99 131* 113* 129*  BUN 38* 44* 53* 58* 57*  CREATININE 1.69* 1.75* 1.92* 1.88* 1.75*  CALCIUM 10.0 10.0 9.6 9.3 8.9   Liver Function Tests:  Recent Labs Lab 12/17/14 0629 12/20/14 0920  AST 121* 142*  ALT 54 73*  ALKPHOS 89 91  BILITOT 7.7* 7.5*  PROT 8.0 8.7*  ALBUMIN 2.9* 2.9*   No results for input(s): LIPASE, AMYLASE in the last 168 hours. No results for input(s): AMMONIA in the last 168 hours. CBC:  Recent Labs Lab 12/18/14 0609  WBC 15.6*  HGB 8.5*  HCT 25.9*  MCV 90.2  PLT 124*   Cardiac Enzymes: No results for input(s): CKTOTAL, CKMB, CKMBINDEX, TROPONINI in the last 168  hours. BNP: BNP (last 3 results)  Recent Labs  07/08/14 0912  BNP 22.0    ProBNP (last 3 results) No results for input(s): PROBNP in the last 8760 hours.  CBG: No results for input(s): GLUCAP in the last 168 hours.     SignedLelon Frohlich  [facility] Pager: [phone] 12/21/2014, 2:54 PM

## 2015-01-04 ENCOUNTER — Telehealth: Payer: Self-pay | Admitting: Nurse Practitioner

## 2015-01-04 ENCOUNTER — Encounter: Payer: Self-pay | Admitting: Nurse Practitioner

## 2015-01-04 ENCOUNTER — Ambulatory Visit: Payer: Self-pay | Admitting: Nurse Practitioner

## 2015-01-04 NOTE — Telephone Encounter (Signed)
PATIENT WAS A NO SHOW AND LETTER SENT  °

## 2015-01-04 NOTE — Telephone Encounter (Signed)
Noted  

## 2015-02-04 ENCOUNTER — Encounter (HOSPITAL_COMMUNITY): Payer: Self-pay | Admitting: Emergency Medicine

## 2015-02-04 ENCOUNTER — Emergency Department (HOSPITAL_COMMUNITY)
Admission: EM | Admit: 2015-02-04 | Discharge: 2015-02-04 | Disposition: A | Discharge status: Home / Self Care | Payer: Medicaid Other | Attending: Emergency Medicine | Admitting: Emergency Medicine

## 2015-02-04 DIAGNOSIS — Z79899 Other long term (current) drug therapy: Secondary | ICD-10-CM | POA: Insufficient documentation

## 2015-02-04 DIAGNOSIS — I10 Essential (primary) hypertension: Secondary | ICD-10-CM | POA: Diagnosis not present

## 2015-02-04 DIAGNOSIS — R609 Edema, unspecified: Secondary | ICD-10-CM

## 2015-02-04 DIAGNOSIS — M7989 Other specified soft tissue disorders: Secondary | ICD-10-CM | POA: Insufficient documentation

## 2015-02-04 DIAGNOSIS — Z8659 Personal history of other mental and behavioral disorders: Secondary | ICD-10-CM | POA: Insufficient documentation

## 2015-02-04 DIAGNOSIS — Z87891 Personal history of nicotine dependence: Secondary | ICD-10-CM | POA: Diagnosis not present

## 2015-02-04 DIAGNOSIS — R5383 Other fatigue: Secondary | ICD-10-CM | POA: Insufficient documentation

## 2015-02-04 DIAGNOSIS — R140 Abdominal distension (gaseous): Secondary | ICD-10-CM | POA: Insufficient documentation

## 2015-02-04 LAB — COMPREHENSIVE METABOLIC PANEL
ALK PHOS: 86 U/L (ref 38–126)
ALT: 16 U/L (ref 14–54)
AST: 40 U/L (ref 15–41)
Albumin: 2 g/dL — ABNORMAL LOW (ref 3.5–5.0)
Anion gap: 4 — ABNORMAL LOW (ref 5–15)
BILIRUBIN TOTAL: 4.3 mg/dL — AB (ref 0.3–1.2)
BUN: 15 mg/dL (ref 6–20)
CALCIUM: 8.3 mg/dL — AB (ref 8.9–10.3)
CO2: 24 mmol/L (ref 22–32)
CREATININE: 1.13 mg/dL — AB (ref 0.44–1.00)
Chloride: 108 mmol/L (ref 101–111)
GFR calc non Af Amer: 59 mL/min — ABNORMAL LOW (ref >60)
Glucose, Bld: 90 mg/dL (ref 65–99)
Potassium: 3.4 mmol/L — ABNORMAL LOW (ref 3.5–5.1)
SODIUM: 136 mmol/L (ref 135–145)
TOTAL PROTEIN: 7.8 g/dL (ref 6.5–8.1)

## 2015-02-04 LAB — CBC WITH DIFFERENTIAL/PLATELET
BASOS ABS: 0 10*3/uL (ref 0.0–0.1)
BASOS PCT: 0 %
EOS ABS: 0.3 10*3/uL (ref 0.0–0.7)
EOS PCT: 2 %
HCT: 25.1 % — ABNORMAL LOW (ref 36.0–46.0)
HEMOGLOBIN: 8.3 g/dL — AB (ref 12.0–15.0)
LYMPHS ABS: 3.2 10*3/uL (ref 0.7–4.0)
Lymphocytes Relative: 29 %
MCH: 30.9 pg (ref 26.0–34.0)
MCHC: 33.1 g/dL (ref 30.0–36.0)
MCV: 93.3 fL (ref 78.0–100.0)
Monocytes Absolute: 0.7 10*3/uL (ref 0.1–1.0)
Monocytes Relative: 6 %
NEUTROS PCT: 63 %
Neutro Abs: 7.1 10*3/uL (ref 1.7–7.7)
PLATELETS: 165 10*3/uL (ref 150–400)
RBC: 2.69 MIL/uL — AB (ref 3.87–5.11)
RDW: 16 % — ABNORMAL HIGH (ref 11.5–15.5)
WBC: 11.3 10*3/uL — ABNORMAL HIGH (ref 4.0–10.5)

## 2015-02-04 LAB — LIPASE, BLOOD: LIPASE: 58 U/L — AB (ref 11–51)

## 2015-02-04 LAB — POC URINE PREG, ED: PREG TEST UR: NEGATIVE

## 2015-02-04 MED ORDER — SODIUM CHLORIDE 0.9 % IV BOLUS (SEPSIS)
1000.0000 mL | Freq: Once | INTRAVENOUS | Status: AC
  Administered 2015-02-04: 1000 mL via INTRAVENOUS

## 2015-02-04 NOTE — ED Notes (Signed)
Pt states she has a hx of cirrhosis and is having abd pain, swelling and leg swelling. Pt states she has made minimal urine for the past two weeks. Pt states she has ran a fever, denies V/D/. Pt states she has gained approx 40lb in the past four months.

## 2015-02-04 NOTE — ED Provider Notes (Signed)
CSN: [id number]     Arrival date & time 02/04/15  1915 History   First MD Initiated Contact with Patient 02/04/15 2001     Chief Complaint  Patient presents with  . Leg Swelling  . Abdominal Pain    HPI Patient was also concern of swelling, diffusely, but primarily in the belly, both legs. This is been going on for about 3 months, including during one hospitalization here 6 weeks ago. She notes that since discharge she has not followed up with anyone, including her primary care physician, though she acknowledges that she was directed to do so. She notes that over the last 6 weeks she has had weight gain, as well as increasing generalized discomfort, mild fatigability with exertion, but no new dyspnea, chest pain, fever, vomiting, anorexia, confusion, disorientation. She takes spironolactone as directed. She has stopped drinking alcohol.    Past Medical History  Diagnosis Date  . Hypertension   . Depression   . Migraines   . Peripheral edema   . Alcohol abuse    Past Surgical History  Procedure Laterality Date  . Abcess drainage    . Cesarean section     Family History  Problem Relation Age of Onset  . Diabetes Father   . Cancer Other   . Cervical cancer Maternal Grandmother   . Lung cancer Maternal Grandfather   . Alcohol abuse Other     multiple family members  . Colon cancer Neg Hx   . Liver disease Neg Hx    Social History  Substance Use Topics  . Smoking status: Former Smoker -- 1.00 packs/day for 5 years    Types: Cigarettes    Quit date: 12/29/2010  . Smokeless tobacco: Former Systems developer    Types: Snuff  . Alcohol Use: No     Comment: most days. several drinks of Vodka. heavy for four years and worse over past 10 months (12/03/14) Last drink was 2 weeks ago, per pt. (12/10/2014)   OB History    Gravida Para Term Preterm AB TAB SAB Ectopic Multiple Living   2 1 1  1     1      Review of Systems  Constitutional: Positive for fatigue and unexpected weight change.        Per HPI, otherwise negative  HENT:       Per HPI, otherwise negative  Respiratory:       Per HPI, otherwise negative  Cardiovascular:       Per HPI, otherwise negative  Gastrointestinal: Negative for vomiting.  Endocrine:       Negative aside from HPI  Genitourinary:       Neg aside from HPI   Musculoskeletal:       Per HPI, otherwise negative  Skin: Negative for color change.  Neurological: Negative for syncope.      Allergies  Review of patient's allergies indicates no known allergies.  Home Medications   Prior to Admission medications   Medication Sig Start Date End Date Taking? Authorizing Provider  lisinopril-hydrochlorothiazide (PRINZIDE,ZESTORETIC) 20-25 MG tablet Take 1 tablet by mouth daily.   Yes Historical Provider, MD  spironolactone (ALDACTONE) 50 MG tablet Take 1 tablet (50 mg total) by mouth daily. 12/21/14  Yes Erline Hau, MD  folic acid (FOLVITE) 1 MG tablet Take 1 tablet (1 mg total) by mouth daily. 12/05/14   Erline Hau, MD  Multiple Vitamin (MULTIVITAMIN WITH MINERALS) TABS tablet Take 1 tablet by mouth daily. 12/05/14  Erline Hau, MD  pantoprazole (PROTONIX) 40 MG tablet Take 1 tablet (40 mg total) by mouth daily before breakfast. 12/05/14   Erline Hau, MD  prednisoLONE (ORAPRED ODT) 10 MG disintegrating tablet Take 40 mg a day for 28 days. Then take 30 mg a day for 5 days, then 20 mg a day for 5 days, then 20 mg a day for 5 days. Then stop. 12/10/14   Carlis Stable, NP  thiamine 100 MG tablet Take 1 tablet (100 mg total) by mouth daily. 12/05/14   Erline Hau, MD   BP 137/81 mmHg  Pulse 101  Temp(Src) 97.7 F (36.5 C) (Oral)  Resp 20  Ht 5\' 3"  (1.6 m)  Wt 278 lb 6.4 oz (126.281 kg)  BMI 49.33 kg/m2  SpO2 100%  LMP 11/05/2014 Physical Exam  Constitutional: She is oriented to person, place, and time. She appears well-developed and well-nourished. No distress.  HENT:  Head:  Normocephalic and atraumatic.  Eyes: Conjunctivae and EOM are normal. Scleral icterus is present.  Cardiovascular: Normal rate and regular rhythm.   Pulmonary/Chest: Effort normal and breath sounds normal. No stridor. No respiratory distress.  Abdominal: She exhibits distension.  Mild diffuse tenderness, without rebound or guarding  Musculoskeletal: She exhibits no edema.  Neurological: She is alert and oriented to person, place, and time. No cranial nerve deficit.  Skin: Skin is warm and dry.  Psychiatric: She has a normal mood and affect.  Nursing note and vitals reviewed.   ED Course  Procedures (including critical care time) Labs Review Labs Reviewed  COMPREHENSIVE METABOLIC PANEL - Abnormal; Notable for the following:    Potassium 3.4 (*)    Creatinine, Ser 1.13 (*)    Calcium 8.3 (*)    Albumin 2.0 (*)    Total Bilirubin 4.3 (*)    GFR calc non Af Amer 59 (*)    Anion gap 4 (*)    All other components within normal limits  CBC WITH DIFFERENTIAL/PLATELET - Abnormal; Notable for the following:    WBC 11.3 (*)    RBC 2.69 (*)    Hemoglobin 8.3 (*)    HCT 25.1 (*)    RDW 16.0 (*)    All other components within normal limits  LIPASE, BLOOD - Abnormal; Notable for the following:    Lipase 58 (*)    All other components within normal limits    I have personally reviewed and evaluated these lab results as part of my medical decision-making.   chart review notable for hospitalization 2 months ago, during which the patient was found to have renal and hepatic injuries. Ultrasound at that time demonstrated diffuse ascites.   9:39 PM On repeat exam the patient remains in similar condition, hemodynamically stable. We reviewed all labs from today, and compared them to her studies from admission 6 weeks ago. Specifically we addressed the improved creatinine, decreased bilirubin, and normalized hepatic function.  Patient will follow-up with her gastrin neurology in the coming  days, as well as primary care within 1 week.  MDM  Agent presents with concern of diffuse edema. Notably, the patient has had symptoms for months, including during hospitalization 6 weeks ago, which was found to have hepatic and renal dysfunction. Patient is now not drinking alcohol, and though she has persistent edema, there is no evidence for peritonitis, occult infection, and labs are generally substantially improved from recent admission. After lengthy conversation about all findings, the need for further gastroenterology  management, patient was discharged in stable condition.   Carmin Muskrat, MD 02/04/15 2140

## 2015-02-04 NOTE — ED Notes (Signed)
Pt states understanding of care given and follow up instructions 

## 2015-02-04 NOTE — Discharge Instructions (Signed)
As discussed, your lab evaluation tonight demonstrates that your body is recovering from your recent illness.  To continue appropriate management is very important that you follow-up with our gastroenterology colleagues within the coming week.  Return here for concerning changes in your condition.

## 2015-02-16 ENCOUNTER — Encounter (HOSPITAL_COMMUNITY): Payer: Self-pay | Admitting: Emergency Medicine

## 2015-02-16 ENCOUNTER — Inpatient Hospital Stay (HOSPITAL_COMMUNITY)
Admission: EM | Admit: 2015-02-16 | Discharge: 2015-02-25 | DRG: 432 | Disposition: A | Discharge status: Home / Self Care | Payer: Medicaid Other | Attending: Family Medicine | Admitting: Family Medicine

## 2015-02-16 ENCOUNTER — Emergency Department (HOSPITAL_COMMUNITY): Payer: Medicaid Other

## 2015-02-16 DIAGNOSIS — Z8049 Family history of malignant neoplasm of other genital organs: Secondary | ICD-10-CM

## 2015-02-16 DIAGNOSIS — Z833 Family history of diabetes mellitus: Secondary | ICD-10-CM | POA: Diagnosis not present

## 2015-02-16 DIAGNOSIS — F1020 Alcohol dependence, uncomplicated: Secondary | ICD-10-CM | POA: Diagnosis present

## 2015-02-16 DIAGNOSIS — I8510 Secondary esophageal varices without bleeding: Secondary | ICD-10-CM | POA: Diagnosis present

## 2015-02-16 DIAGNOSIS — E871 Hypo-osmolality and hyponatremia: Secondary | ICD-10-CM | POA: Diagnosis present

## 2015-02-16 DIAGNOSIS — K7011 Alcoholic hepatitis with ascites: Secondary | ICD-10-CM | POA: Diagnosis present

## 2015-02-16 DIAGNOSIS — K7031 Alcoholic cirrhosis of liver with ascites: Principal | ICD-10-CM | POA: Insufficient documentation

## 2015-02-16 DIAGNOSIS — R601 Generalized edema: Secondary | ICD-10-CM | POA: Diagnosis present

## 2015-02-16 DIAGNOSIS — E876 Hypokalemia: Secondary | ICD-10-CM | POA: Diagnosis present

## 2015-02-16 DIAGNOSIS — F1010 Alcohol abuse, uncomplicated: Secondary | ICD-10-CM | POA: Diagnosis present

## 2015-02-16 DIAGNOSIS — K5900 Constipation, unspecified: Secondary | ICD-10-CM | POA: Diagnosis present

## 2015-02-16 DIAGNOSIS — Y95 Nosocomial condition: Secondary | ICD-10-CM | POA: Diagnosis present

## 2015-02-16 DIAGNOSIS — Z87891 Personal history of nicotine dependence: Secondary | ICD-10-CM

## 2015-02-16 DIAGNOSIS — R0600 Dyspnea, unspecified: Secondary | ICD-10-CM | POA: Diagnosis present

## 2015-02-16 DIAGNOSIS — K3189 Other diseases of stomach and duodenum: Secondary | ICD-10-CM | POA: Diagnosis present

## 2015-02-16 DIAGNOSIS — Z801 Family history of malignant neoplasm of trachea, bronchus and lung: Secondary | ICD-10-CM

## 2015-02-16 DIAGNOSIS — Z9114 Patient's other noncompliance with medication regimen: Secondary | ICD-10-CM

## 2015-02-16 DIAGNOSIS — J154 Pneumonia due to other streptococci: Secondary | ICD-10-CM | POA: Diagnosis present

## 2015-02-16 DIAGNOSIS — K2960 Other gastritis without bleeding: Secondary | ICD-10-CM | POA: Diagnosis present

## 2015-02-16 DIAGNOSIS — I10 Essential (primary) hypertension: Secondary | ICD-10-CM | POA: Diagnosis present

## 2015-02-16 DIAGNOSIS — D638 Anemia in other chronic diseases classified elsewhere: Secondary | ICD-10-CM | POA: Diagnosis present

## 2015-02-16 DIAGNOSIS — A599 Trichomoniasis, unspecified: Secondary | ICD-10-CM | POA: Diagnosis present

## 2015-02-16 DIAGNOSIS — D689 Coagulation defect, unspecified: Secondary | ICD-10-CM | POA: Diagnosis present

## 2015-02-16 DIAGNOSIS — J189 Pneumonia, unspecified organism: Secondary | ICD-10-CM

## 2015-02-16 DIAGNOSIS — E8770 Fluid overload, unspecified: Secondary | ICD-10-CM | POA: Diagnosis present

## 2015-02-16 DIAGNOSIS — K766 Portal hypertension: Secondary | ICD-10-CM | POA: Diagnosis present

## 2015-02-16 DIAGNOSIS — Z452 Encounter for adjustment and management of vascular access device: Secondary | ICD-10-CM

## 2015-02-16 DIAGNOSIS — K7030 Alcoholic cirrhosis of liver without ascites: Secondary | ICD-10-CM | POA: Diagnosis present

## 2015-02-16 LAB — COMPREHENSIVE METABOLIC PANEL
ALBUMIN: 2.1 g/dL — AB (ref 3.5–5.0)
ALK PHOS: 92 U/L (ref 38–126)
ALT: 18 U/L (ref 14–54)
AST: 45 U/L — AB (ref 15–41)
Anion gap: 6 (ref 5–15)
BUN: 9 mg/dL (ref 6–20)
CALCIUM: 8.1 mg/dL — AB (ref 8.9–10.3)
CO2: 24 mmol/L (ref 22–32)
CREATININE: 1.02 mg/dL — AB (ref 0.44–1.00)
Chloride: 109 mmol/L (ref 101–111)
Glucose, Bld: 103 mg/dL — ABNORMAL HIGH (ref 65–99)
Potassium: 3.5 mmol/L (ref 3.5–5.1)
Sodium: 139 mmol/L (ref 135–145)
Total Bilirubin: 3.9 mg/dL — ABNORMAL HIGH (ref 0.3–1.2)
Total Protein: 7.7 g/dL (ref 6.5–8.1)

## 2015-02-16 LAB — CBC WITH DIFFERENTIAL/PLATELET
BASOS PCT: 0 %
Basophils Absolute: 0 10*3/uL (ref 0.0–0.1)
EOS ABS: 0.3 10*3/uL (ref 0.0–0.7)
Eosinophils Relative: 3 %
HCT: 24.6 % — ABNORMAL LOW (ref 36.0–46.0)
HEMOGLOBIN: 8 g/dL — AB (ref 12.0–15.0)
Lymphocytes Relative: 30 %
Lymphs Abs: 2.9 10*3/uL (ref 0.7–4.0)
MCH: 30.2 pg (ref 26.0–34.0)
MCHC: 32.5 g/dL (ref 30.0–36.0)
MCV: 92.8 fL (ref 78.0–100.0)
Monocytes Absolute: 1 10*3/uL (ref 0.1–1.0)
Monocytes Relative: 11 %
NEUTROS PCT: 56 %
Neutro Abs: 5.5 10*3/uL (ref 1.7–7.7)
Platelets: 174 10*3/uL (ref 150–400)
RBC: 2.65 MIL/uL — AB (ref 3.87–5.11)
RDW: 16.2 % — ABNORMAL HIGH (ref 11.5–15.5)
WBC: 9.8 10*3/uL (ref 4.0–10.5)

## 2015-02-16 LAB — URINALYSIS, ROUTINE W REFLEX MICROSCOPIC
Glucose, UA: NEGATIVE mg/dL
HGB URINE DIPSTICK: NEGATIVE
Ketones, ur: NEGATIVE mg/dL
Nitrite: NEGATIVE
Protein, ur: NEGATIVE mg/dL
Specific Gravity, Urine: 1.02 (ref 1.005–1.030)
pH: 5.5 (ref 5.0–8.0)

## 2015-02-16 LAB — BLOOD GAS, ARTERIAL
Acid-base deficit: 1.1 mmol/L (ref 0.0–2.0)
BICARBONATE: 23.1 meq/L (ref 20.0–24.0)
Drawn by: 277331
O2 Content: 3 L/min
O2 Saturation: 59.2 %
PH ART: 7.411 (ref 7.350–7.450)
PO2 ART: 36.3 mmHg — AB (ref 80.0–100.0)
Patient temperature: 37
pCO2 arterial: 36.8 mmHg (ref 35.0–45.0)

## 2015-02-16 LAB — URINE MICROSCOPIC-ADD ON

## 2015-02-16 LAB — LIPASE, BLOOD: LIPASE: 53 U/L — AB (ref 11–51)

## 2015-02-16 LAB — PROTIME-INR
INR: 1.87 — AB (ref 0.00–1.49)
Prothrombin Time: 21.5 seconds — ABNORMAL HIGH (ref 11.6–15.2)

## 2015-02-16 LAB — LACTIC ACID, PLASMA: Lactic Acid, Venous: 2.6 mmol/L (ref 0.5–2.0)

## 2015-02-16 LAB — AMMONIA: Ammonia: 53 umol/L — ABNORMAL HIGH (ref 9–35)

## 2015-02-16 LAB — BRAIN NATRIURETIC PEPTIDE: B Natriuretic Peptide: 242 pg/mL — ABNORMAL HIGH (ref 0.0–100.0)

## 2015-02-16 LAB — TROPONIN I

## 2015-02-16 MED ORDER — AZITHROMYCIN 500 MG IV SOLR: 500.0000 mg | Freq: Once | INTRAVENOUS | Status: DC

## 2015-02-16 MED ORDER — SENNOSIDES-DOCUSATE SODIUM 8.6-50 MG PO TABS: 1.0000 | ORAL_TABLET | Freq: Every evening | ORAL | Status: DC | PRN

## 2015-02-16 MED ORDER — VANCOMYCIN HCL IN DEXTROSE 1-5 GM/200ML-% IV SOLN
1000.0000 mg | INTRAVENOUS | Status: AC
  Administered 2015-02-16 (×2): 1000 mg via INTRAVENOUS
  Filled 2015-02-16 (×2): qty 200

## 2015-02-16 MED ORDER — PIPERACILLIN-TAZOBACTAM 3.375 G IVPB 30 MIN
3.3750 g | Freq: Once | INTRAVENOUS | Status: AC
  Administered 2015-02-16: 3.375 g via INTRAVENOUS
  Filled 2015-02-16: qty 50

## 2015-02-16 MED ORDER — ALBUTEROL SULFATE (2.5 MG/3ML) 0.083% IN NEBU: 5.0000 mg | INHALATION_SOLUTION | Freq: Once | RESPIRATORY_TRACT | Status: DC

## 2015-02-16 MED ORDER — FOLIC ACID 1 MG PO TABS: 1.0000 mg | ORAL_TABLET | Freq: Every day | ORAL | Status: DC

## 2015-02-16 MED ORDER — ONDANSETRON HCL 4 MG PO TABS: 4.0000 mg | ORAL_TABLET | Freq: Four times a day (QID) | ORAL | Status: DC | PRN

## 2015-02-16 MED ORDER — SODIUM CHLORIDE 0.9 % IJ SOLN
3.0000 mL | Freq: Two times a day (BID) | INTRAMUSCULAR | Status: DC
  Administered 2015-02-16 – 2015-02-18 (×4): 3 mL via INTRAVENOUS

## 2015-02-16 MED ORDER — VITAMIN B-1 100 MG PO TABS
100.0000 mg | ORAL_TABLET | Freq: Every day | ORAL | Status: DC
  Administered 2015-02-16 – 2015-02-25 (×10): 100 mg via ORAL
  Filled 2015-02-16 (×10): qty 1

## 2015-02-16 MED ORDER — SODIUM CHLORIDE 0.9 % IJ SOLN: 3.0000 mL | INTRAMUSCULAR | Status: DC | PRN

## 2015-02-16 MED ORDER — VANCOMYCIN HCL 10 G IV SOLR
1250.0000 mg | Freq: Three times a day (TID) | INTRAVENOUS | Status: DC
  Filled 2015-02-16 (×2): qty 1250

## 2015-02-16 MED ORDER — ADULT MULTIVITAMIN W/MINERALS CH
1.0000 | ORAL_TABLET | Freq: Every day | ORAL | Status: DC
  Administered 2015-02-16 – 2015-02-25 (×10): 1 via ORAL
  Filled 2015-02-16 (×10): qty 1

## 2015-02-16 MED ORDER — FUROSEMIDE 10 MG/ML IJ SOLN
40.0000 mg | Freq: Two times a day (BID) | INTRAMUSCULAR | Status: DC
  Administered 2015-02-16: 40 mg via INTRAVENOUS
  Filled 2015-02-16: qty 4

## 2015-02-16 MED ORDER — DEXTROSE 5 % IV SOLN
INTRAVENOUS | Status: AC
  Filled 2015-02-16 (×2): qty 1

## 2015-02-16 MED ORDER — PANTOPRAZOLE SODIUM 40 MG PO TBEC
40.0000 mg | DELAYED_RELEASE_TABLET | Freq: Every day | ORAL | Status: DC
  Administered 2015-02-17 – 2015-02-24 (×8): 40 mg via ORAL
  Filled 2015-02-16 (×8): qty 1

## 2015-02-16 MED ORDER — DEXTROSE 5 % IV SOLN: 1.0000 g | Freq: Once | INTRAVENOUS | Status: DC

## 2015-02-16 MED ORDER — ONDANSETRON HCL 4 MG/2ML IJ SOLN
4.0000 mg | Freq: Four times a day (QID) | INTRAMUSCULAR | Status: DC | PRN
  Administered 2015-02-25: 4 mg via INTRAVENOUS
  Filled 2015-02-16: qty 2

## 2015-02-16 MED ORDER — SODIUM CHLORIDE 0.9 % IV SOLN: 250.0000 mL | INTRAVENOUS | Status: DC | PRN

## 2015-02-16 MED ORDER — DEXTROSE 5 % IV SOLN
1.0000 g | Freq: Three times a day (TID) | INTRAVENOUS | Status: DC
  Administered 2015-02-16 – 2015-02-20 (×10): 1 g via INTRAVENOUS
  Filled 2015-02-16 (×18): qty 1

## 2015-02-16 MED ORDER — FOLIC ACID 1 MG PO TABS
1.0000 mg | ORAL_TABLET | Freq: Every day | ORAL | Status: DC
  Administered 2015-02-16 – 2015-02-25 (×10): 1 mg via ORAL
  Filled 2015-02-16 (×10): qty 1

## 2015-02-16 MED ORDER — OXYCODONE HCL 5 MG PO TABS
5.0000 mg | ORAL_TABLET | ORAL | Status: DC | PRN
  Administered 2015-02-16 – 2015-02-25 (×26): 5 mg via ORAL
  Filled 2015-02-16 (×26): qty 1

## 2015-02-16 MED ORDER — HEPARIN SODIUM (PORCINE) 5000 UNIT/ML IJ SOLN
5000.0000 [IU] | Freq: Three times a day (TID) | INTRAMUSCULAR | Status: DC
  Administered 2015-02-16 – 2015-02-22 (×14): 5000 [IU] via SUBCUTANEOUS
  Filled 2015-02-16 (×13): qty 1

## 2015-02-16 MED ORDER — SPIRONOLACTONE 25 MG PO TABS
50.0000 mg | ORAL_TABLET | Freq: Every day | ORAL | Status: DC
  Administered 2015-02-16 – 2015-02-17 (×2): 50 mg via ORAL
  Filled 2015-02-16 (×2): qty 2

## 2015-02-16 NOTE — ED Notes (Signed)
Having chest pain and SOB for last three days.  Rates pain 9/10.  Pt was here last week for swelling to legs and abdomen.

## 2015-02-16 NOTE — Progress Notes (Signed)
ANTIBIOTIC CONSULT NOTE - INITIAL  Pharmacy Consult for vancomycin Indication: pneumonia  No Known Allergies  Patient Measurements: Height: 5\' 3"  (160 cm) Weight: 280 lb (127.007 kg) IBW/kg (Calculated) : 52.4   Vital Signs: Temp: 98 F (36.7 C) (12/20 1710) Temp Source: Oral (12/20 1710) BP: 130/72 mmHg (12/20 1710) Pulse Rate: 78 (12/20 1710) Intake/Output from previous day:   Intake/Output from this shift:    Labs:  Recent Labs  02/16/15 1555  WBC 9.8  HGB 8.0*  PLT 174  CREATININE 1.02*   Estimated Creatinine Clearance: 93.2 mL/min (by C-G formula based on Cr of 1.02). No results for input(s): VANCOTROUGH, VANCOPEAK, VANCORANDOM, GENTTROUGH, GENTPEAK, GENTRANDOM, TOBRATROUGH, TOBRAPEAK, TOBRARND, AMIKACINPEAK, AMIKACINTROU, AMIKACIN in the last 72 hours.   Microbiology: No results found for this or any previous visit (from the past 720 hour(s)).  Medical History: Past Medical History  Diagnosis Date  . Hypertension   . Depression   . Migraines   . Peripheral edema   . Alcohol abuse     Medications:  See medication history Assessment: 44 yo lady to start vancomycin for PNA.  She has a h/o cirrhosis and renal insufficiency.  LA is 2.6  Goal of Therapy:  Vancomycin trough level 15-20 mcg/ml  Plan:  Vancomycin 2 gm IV X 1 then 1250 mg IV q8 hours F/u renal function , cultures and clinical course  Thanks for allowing pharmacy to be a part of this patient's care.  Excell Seltzer, PharmD Clinical Pharmacist  02/16/2015,5:34 PM

## 2015-02-16 NOTE — ED Notes (Signed)
CRITICAL VALUE ALERT  Critical value received:  Lactic Acid - 2.6  Date of notification:  02/16/2015  Time of notification:  [id number]  Critical value read back: yes  Nurse who received alert:  LJS  MD notified (1st page):  Dr Venora Maples  Time of first page:  1715  MD notified (2nd page):  Time of second page:  Responding MD:  Dr Venora Maples  Time MD responded:  [phone]

## 2015-02-16 NOTE — H&P (Signed)
[facility]          History and Physical    PCP:   [facility]   EDP: Jola Schmidt, MD  Chief Complaint:   SOB, swelling  HPI: 44 y/o woman with h/o ETOH cirrhosis and hepatitis, who was discharged in October after a prolonged hospitalization for anasarca. ECHO with EF 19-01% and no diastolic dysfunction. She states "nobody continued her fluid pills after discharge" In fact, after prescription given in hospital was completed, she never followed up with GI or called to have this refilled. She comes in today with SOB and increased edema. She has gained 80 lbs since her DC on 12/21/14. CXR shows consolidation in the right middle and right lower lobes. We are asked to admit her for PNA and anasarca.  Allergies:  No Known Allergies    Past Medical History  Diagnosis Date  . Hypertension   . Depression   . Migraines   . Peripheral edema   . Alcohol abuse     Past Surgical History  Procedure Laterality Date  . Abcess drainage    . Cesarean section      Prior to Admission medications   Medication Sig Start Date End Date Taking? Authorizing Provider  B Complex Vitamins (VITAMIN B-COMPLEX PO) Take 1 tablet by mouth daily.   Yes Historical Provider, MD  lisinopril-hydrochlorothiazide (PRINZIDE,ZESTORETIC) 20-25 MG tablet Take 1 tablet by mouth daily.   Yes Historical Provider, MD  Multiple Vitamin (MULTIVITAMIN WITH MINERALS) TABS tablet Take 1 tablet by mouth daily. 12/05/14  Yes Erline Hau, MD  spironolactone (ALDACTONE) 50 MG tablet Take 1 tablet (50 mg total) by mouth daily. 12/21/14  Yes Erline Hau, MD  vitamin E 400 UNIT capsule Take 400 Units by mouth daily.   Yes Historical Provider, MD  folic acid (FOLVITE) 1 MG tablet Take 1 tablet (1 mg total) by mouth daily. Patient not taking: Reported on 02/16/2015 12/05/14   Erline Hau, MD  pantoprazole (PROTONIX) 40 MG tablet Take 1 tablet (40 mg  total) by mouth daily before breakfast. Patient not taking: Reported on 02/16/2015 12/05/14   Erline Hau, MD  prednisoLONE (ORAPRED ODT) 10 MG disintegrating tablet Take 40 mg a day for 28 days. Then take 30 mg a day for 5 days, then 20 mg a day for 5 days, then 20 mg a day for 5 days. Then stop. Patient not taking: Reported on 02/16/2015 12/10/14   Carlis Stable, NP    Social History:  reports that she quit smoking about 4 years ago. Her smoking use included Cigarettes. She has a 5 pack-year smoking history. She has quit using smokeless tobacco. Her smokeless tobacco use included Snuff. She reports that she does not drink alcohol or use illicit drugs.  Family History  Problem Relation Age of Onset  . Diabetes Father   . Cancer Other   . Cervical cancer Maternal Grandmother   . Lung cancer Maternal Grandfather   . Alcohol abuse Other     multiple family members  . Colon cancer Neg Hx   . Liver disease Neg Hx     Review of Systems:  Constitutional: Denies fever, chills, diaphoresis, appetite change and fatigue.  HEENT: Denies photophobia, eye pain, redness, hearing loss, ear pain, congestion, sore throat, rhinorrhea, sneezing, mouth sores, trouble swallowing, neck pain, neck stiffness and tinnitus.  Respiratory: Denies cough, chest tightness,  and wheezing.   Cardiovascular: Denies chest pain, palpitations and leg swelling.  Gastrointestinal: Denies nausea, vomiting, abdominal pain, diarrhea, constipation, blood in stool and abdominal distention.  Genitourinary: Denies dysuria, urgency, frequency, hematuria, flank pain and difficulty urinating.  Endocrine: Denies: hot or cold intolerance, sweats, changes in hair or nails, polyuria, polydipsia. Musculoskeletal: Denies myalgias, back pain, joint swelling, arthralgias and gait problem.  Skin: Denies pallor, rash and wound.  Neurological: Denies dizziness, seizures, syncope, weakness, light-headedness, numbness and headaches.    Hematological: Denies adenopathy. Easy bruising, personal or family bleeding history  Psychiatric/Behavioral: Denies suicidal ideation, mood changes, confusion, nervousness, sleep disturbance and agitation   Physical Exam: Blood pressure 116/71, pulse 103, temperature 98 F (36.7 C), temperature source Oral, resp. rate 33, height _0  (1.6 m), weight 127.007 kg (280 lb), last menstrual period 02/14/2015, SpO2 100 %. GEN: AA Ox3 HEENT: [place]/AT/PERRL Neck: supple, no JVD, no LAD, no bruits, no goiter. CV: RRR, no M/R?G Lungs: bilateral ronchi Abd: S/NT/ND/+BS Ext: 3++ pitting edema up to abdomen bilaterally. Neuro: grossly intact, non-focal  Labs on Admission:  Results for orders placed or performed during the hospital encounter of 02/16/15 (from the past 48 hour(s))  Urinalysis, Routine w reflex microscopic (not at [facility])     Status: Abnormal   Collection Time: 02/16/15  3:41 PM  Result Value Ref Range   Color, Urine YELLOW YELLOW   APPearance HAZY (A) CLEAR   Specific Gravity, Urine 1.020 1.005 - 1.030   pH 5.5 5.0 - 8.0   Glucose, UA NEGATIVE NEGATIVE mg/dL   Hgb urine dipstick NEGATIVE NEGATIVE   Bilirubin Urine SMALL (A) NEGATIVE   Ketones, ur NEGATIVE NEGATIVE mg/dL   Protein, ur NEGATIVE NEGATIVE mg/dL   Nitrite NEGATIVE NEGATIVE   Leukocytes, UA MODERATE (A) NEGATIVE  Urine microscopic-add on     Status: Abnormal   Collection Time: 02/16/15  3:41 PM  Result Value Ref Range   Squamous Epithelial / LPF TOO NUMEROUS TO COUNT (A) NONE SEEN   WBC, UA 6-30 0 - 5 WBC/hpf   RBC / HPF 0-5 0 - 5 RBC/hpf   Bacteria, UA FEW (A) NONE SEEN   Urine-Other TRICHOMONAS PRESENT   Comprehensive metabolic panel     Status: Abnormal   Collection Time: 02/16/15  3:55 PM  Result Value Ref Range   Sodium 139 135 - 145 mmol/L   Potassium 3.5 3.5 - 5.1 mmol/L   Chloride 109 101 - 111 mmol/L   CO2 24 22 - 32 mmol/L   Glucose, Bld 103 (H) 65 - 99 mg/dL   BUN 9 6 - 20 mg/dL   Creatinine, Ser  1.02 (H) 0.44 - 1.00 mg/dL   Calcium 8.1 (L) 8.9 - 10.3 mg/dL   Total Protein 7.7 6.5 - 8.1 g/dL   Albumin 2.1 (L) 3.5 - 5.0 g/dL   AST 45 (H) 15 - 41 U/L   ALT 18 14 - 54 U/L   Alkaline Phosphatase 92 38 - 126 U/L   Total Bilirubin 3.9 (H) 0.3 - 1.2 mg/dL   GFR calc non Af Amer >60 >60 mL/min   GFR calc Af Amer >60 >60 mL/min    Comment: (NOTE) The eGFR has been calculated using the CKD EPI equation. This calculation has not been validated in all clinical situations. eGFR's persistently <60 mL/min signify possible Chronic Kidney Disease.    Anion gap 6 5 - 15  CBC with Differential     Status:  Abnormal   Collection Time: 02/16/15  3:55 PM  Result Value Ref Range   WBC 9.8 4.0 - 10.5 K/uL   RBC 2.65 (L) 3.87 - 5.11 MIL/uL   Hemoglobin 8.0 (L) 12.0 - 15.0 g/dL   HCT 24.6 (L) 36.0 - 46.0 %   MCV 92.8 78.0 - 100.0 fL   MCH 30.2 26.0 - 34.0 pg   MCHC 32.5 30.0 - 36.0 g/dL   RDW 16.2 (H) 11.5 - 15.5 %   Platelets 174 150 - 400 K/uL   Neutrophils Relative % 56 %   Neutro Abs 5.5 1.7 - 7.7 K/uL   Lymphocytes Relative 30 %   Lymphs Abs 2.9 0.7 - 4.0 K/uL   Monocytes Relative 11 %   Monocytes Absolute 1.0 0.1 - 1.0 K/uL   Eosinophils Relative 3 %   Eosinophils Absolute 0.3 0.0 - 0.7 K/uL   Basophils Relative 0 %   Basophils Absolute 0.0 0.0 - 0.1 K/uL  Lipase, blood     Status: Abnormal   Collection Time: 02/16/15  3:55 PM  Result Value Ref Range   Lipase 53 (H) 11 - 51 U/L  Troponin I     Status: None   Collection Time: 02/16/15  3:55 PM  Result Value Ref Range   Troponin I <0.03 <0.031 ng/mL    Comment:        NO INDICATION OF MYOCARDIAL INJURY.   Brain natriuretic peptide     Status: Abnormal   Collection Time: 02/16/15  3:55 PM  Result Value Ref Range   B Natriuretic Peptide 242.0 (H) 0.0 - 100.0 pg/mL  Protime-INR     Status: Abnormal   Collection Time: 02/16/15  3:55 PM  Result Value Ref Range   Prothrombin Time 21.5 (H) 11.6 - 15.2 seconds   INR 1.87 (H) 0.00  - 1.49  Ammonia     Status: Abnormal   Collection Time: 02/16/15  4:14 PM  Result Value Ref Range   Ammonia 53 (H) 9 - 35 umol/L  Lactic acid, plasma     Status: Abnormal   Collection Time: 02/16/15  4:14 PM  Result Value Ref Range   Lactic Acid, Venous 2.6 (HH) 0.5 - 2.0 mmol/L    Comment: CRITICAL RESULT CALLED TO, READ BACK BY AND VERIFIED WITH: SHORE,L AT 1700 ON 02/16/15 BY ISLEY,B   Blood gas, arterial ([facility] & [facility] ONLY)     Status: Abnormal   Collection Time: 02/16/15  5:35 PM  Result Value Ref Range   O2 Content 3.0 L/min   Delivery systems NASAL CANNULA    pH, Arterial 7.411 7.350 - 7.450   pCO2 arterial 36.8 35.0 - 45.0 mmHg   pO2, Arterial 36.3 (LL) 80.0 - 100.0 mmHg    Comment: CRITICAL RESULT CALLED TO, READ BACK BY AND VERIFIED WITH:  TIFFANY OSBORNE,RN AT 1747, BY WENDY VIA,RRT,RCP ON 02/16/2015    Bicarbonate 23.1 20.0 - 24.0 mEq/L   Acid-base deficit 1.1 0.0 - 2.0 mmol/L   O2 Saturation 59.2 %   Patient temperature 37.0    Collection site LEFT RADIAL    Drawn by [id number]    Sample type ARTERIAL DRAW    Allens test (pass/fail) PASS PASS    Radiological Exams on Admission: Dg Chest 2 View  02/16/2015  CLINICAL DATA:  Shortness of breath and nonproductive cough for 3 days EXAM: CHEST - 2 VIEW COMPARISON:  12/10/2014 FINDINGS: Cardiac shadow is stable. Elevation the right hemidiaphragm is noted with consolidation in the  right middle and right lower lobe. No bony abnormality is noted. IMPRESSION: Consolidation in the right middle and right lower lobes. Followup PA and lateral chest X-ray is recommended in 3-4 weeks following trial of antibiotic therapy to ensure resolution and exclude underlying malignancy. Electronically Signed   By: Inez Catalina M.D.   On: 02/16/2015 16:46    Assessment/Plan Active Problems:   HCAP (healthcare-associated pneumonia)   Essential hypertension   Alcohol abuse   Cirrhosis ([place])   Anasarca   HCAP -Vanc/cefepime -Blood/sputum  cx. -Strep pneumo/legionella urine antigen  Anasarca/Cirrhosis -Massive volume-overload. -No CHF. -Stopped taking diuretics as she ran out and never followed up. -Lasix 40 IV BID, spironolactone 50. -Strict Is and Os; daily weights  ETOH Abuse -Thiamine/folate -No longer drinking.  DVT Prophlyaxis -Heparin SQ  Code Status -Full code   Time Spent on Admission: 85 minutes  HERNANDEZ ACOSTA,ESTELA [facility] Pager: [phone] 02/16/2015, 6:53 PM

## 2015-02-16 NOTE — ED Notes (Signed)
Critical VBG results given to Dr Venora Maples.

## 2015-02-16 NOTE — ED Provider Notes (Signed)
CSN: [id number]     Arrival date & time 02/16/15  1512 History   First MD Initiated Contact with Patient 02/16/15 1533     Chief Complaint  Patient presents with  . Chest Pain  . Shortness of Breath  . Leg Swelling  . abdominal swelling    abdominal swelling     (Consider location/radiation/quality/duration/timing/severity/associated sxs/prior Treatment) The history is provided by the patient.   Whitney Bush is a 44 y.o. female with a past medical history significant for alcohol induced cirrhosis, htn and renal insufficiency presenting with increased shortness of breath over the past 3 days and difficulty taking a breath as she describes increased abdominal swelling, chest pressure and inability to get a good inhalation.  She endorses bilateral lower extremity edema which has been present since her hospitalization for these symptoms 2 months ago.  This edema has continued to worsen as well.  She was prescribed aldactone at the time of her hospitalization but ran out of this medicine several days ago.  She is no longer drinking etoh.  She has not yet followed up with her pcp (states does not currently have one) nor has she been able to see renal or GI in hospital followup, but states she is scheduled to see GI tomorrow. She denies fevers, chills or cough, or vomiting but does stay nauseated. She endorses orthopnea and dyspnea on exertion.  She also mentions decreased urinary frequency and very dark urine production.     Past Medical History  Diagnosis Date  . Hypertension   . Depression   . Migraines   . Peripheral edema   . Alcohol abuse    Past Surgical History  Procedure Laterality Date  . Abcess drainage    . Cesarean section     Family History  Problem Relation Age of Onset  . Diabetes Father   . Cancer Other   . Cervical cancer Maternal Grandmother   . Lung cancer Maternal Grandfather   . Alcohol abuse Other     multiple family members  . Colon cancer Neg Hx   .  Liver disease Neg Hx    Social History  Substance Use Topics  . Smoking status: Former Smoker -- 1.00 packs/day for 5 years    Types: Cigarettes    Quit date: 12/29/2010  . Smokeless tobacco: Former Systems developer    Types: Snuff  . Alcohol Use: No     Comment: most days. several drinks of Vodka. heavy for four years and worse over past 10 months (12/03/14) Last drink was 2 weeks ago, per pt. (12/10/2014)   OB History    Gravida Para Term Preterm AB TAB SAB Ectopic Multiple Living   2 1 1  1     1      Review of Systems  Constitutional: Negative for fever and chills.  HENT: Negative for congestion and sore throat.   Eyes: Negative.   Respiratory: Positive for chest tightness, shortness of breath and stridor.   Cardiovascular: Positive for leg swelling. Negative for chest pain.  Gastrointestinal: Positive for nausea and abdominal distention. Negative for vomiting and abdominal pain.       Denies abdominal pain but is uncomfortable with distention.  Genitourinary: Negative.   Musculoskeletal: Negative for joint swelling, arthralgias and neck pain.  Skin: Negative.  Negative for rash and wound.  Neurological: Negative for dizziness, weakness, light-headedness, numbness and headaches.  Psychiatric/Behavioral: Negative.       Allergies  Review of patient's allergies indicates no known  allergies.  Home Medications   Prior to Admission medications   Medication Sig Start Date End Date Taking? Authorizing Provider  folic acid (FOLVITE) 1 MG tablet Take 1 tablet (1 mg total) by mouth daily. 12/05/14   Erline Hau, MD  lisinopril-hydrochlorothiazide (PRINZIDE,ZESTORETIC) 20-25 MG tablet Take 1 tablet by mouth daily.    Historical Provider, MD  Multiple Vitamin (MULTIVITAMIN WITH MINERALS) TABS tablet Take 1 tablet by mouth daily. 12/05/14   Erline Hau, MD  pantoprazole (PROTONIX) 40 MG tablet Take 1 tablet (40 mg total) by mouth daily before breakfast. 12/05/14   Erline Hau, MD  prednisoLONE (ORAPRED ODT) 10 MG disintegrating tablet Take 40 mg a day for 28 days. Then take 30 mg a day for 5 days, then 20 mg a day for 5 days, then 20 mg a day for 5 days. Then stop. 12/10/14   Carlis Stable, NP  spironolactone (ALDACTONE) 50 MG tablet Take 1 tablet (50 mg total) by mouth daily. 12/21/14   Erline Hau, MD  thiamine 100 MG tablet Take 1 tablet (100 mg total) by mouth daily. 12/05/14   Erline Hau, MD   BP 134/55 mmHg  Pulse 103  Temp(Src) 98.1 F (36.7 C)  Resp 28  Ht 5\' 3"  (1.6 m)  Wt 127.007 kg  BMI 49.61 kg/m2  SpO2 100%  LMP 02/14/2015 Physical Exam  Constitutional: She appears well-developed and well-nourished.  HENT:  Head: Normocephalic and atraumatic.  Eyes: Scleral icterus is present.  Neck: Normal range of motion.  Cardiovascular: Normal rate, regular rhythm, normal heart sounds and intact distal pulses.   Pulmonary/Chest: Effort normal. Stridor present. She has decreased breath sounds. She has no wheezes. She has rales.  Abdominal: Soft. Bowel sounds are normal. She exhibits distension, fluid wave and ascites. There is no tenderness.  Musculoskeletal: Normal range of motion.  Bilateral lower extremity 2+ pitting edema to upper thighs.  Neurological: She is alert.  Skin: Skin is warm and dry.  Psychiatric: She has a normal mood and affect.  Nursing note and vitals reviewed.   ED Course  Procedures (including critical care time) Labs Review Labs Reviewed  CBC WITH DIFFERENTIAL/PLATELET - Abnormal; Notable for the following:    RBC 2.65 (*)    Hemoglobin 8.0 (*)    HCT 24.6 (*)    RDW 16.2 (*)    All other components within normal limits  BRAIN NATRIURETIC PEPTIDE - Abnormal; Notable for the following:    B Natriuretic Peptide 242.0 (*)    All other components within normal limits  PROTIME-INR - Abnormal; Notable for the following:    Prothrombin Time 21.5 (*)    INR 1.87 (*)    All other  components within normal limits  CULTURE, BLOOD (ROUTINE X 2)  CULTURE, BLOOD (ROUTINE X 2)  COMPREHENSIVE METABOLIC PANEL  LIPASE, BLOOD  URINALYSIS, ROUTINE W REFLEX MICROSCOPIC (NOT AT [facility])  TROPONIN I  AMMONIA  BLOOD GAS, ARTERIAL  LACTIC ACID, PLASMA    Imaging Review Dg Chest 2 View  02/16/2015  CLINICAL DATA:  Shortness of breath and nonproductive cough for 3 days EXAM: CHEST - 2 VIEW COMPARISON:  12/10/2014 FINDINGS: Cardiac shadow is stable. Elevation the right hemidiaphragm is noted with consolidation in the right middle and right lower lobe. No bony abnormality is noted. IMPRESSION: Consolidation in the right middle and right lower lobes. Followup PA and lateral chest X-ray is recommended in 3-4 weeks following  trial of antibiotic therapy to ensure resolution and exclude underlying malignancy. Electronically Signed   By: Inez Catalina M.D.   On: 02/16/2015 16:46   I have personally reviewed and evaluated these images and lab results as part of my medical decision-making.   EKG Interpretation   Date/Time:  Tuesday February 16 2015 15:22:52 EST Ventricular Rate:  111 PR Interval:  101 QRS Duration: 57 QT Interval:  367 QTC Calculation: 499 R Axis:   44 Text Interpretation:  Sinus tachycardia Atrial premature complex Low  voltage, extremity and precordial leads Borderline prolonged QT interval  No significant change was found Confirmed by CAMPOS  MD, Lennette Bihari ([id number]) on  02/16/2015 4:15:21 PM      MDM   Final diagnoses:  HAP (hospital-acquired pneumonia)    Pt with hospital acquired pneumonia, fluid overload with cirrhosis and h/o renal insufficiency.  Pending remaining labs, abx ordered.  Discussed case with Dr. Venora Maples who will follow and dispo once lab results obtained.    Evalee Jefferson, PA-C 02/16/15 [place], MD 02/16/15 [phone]

## 2015-02-17 ENCOUNTER — Inpatient Hospital Stay (HOSPITAL_COMMUNITY): Payer: Medicaid Other

## 2015-02-17 ENCOUNTER — Ambulatory Visit: Payer: Self-pay | Admitting: Gastroenterology

## 2015-02-17 DIAGNOSIS — R0600 Dyspnea, unspecified: Secondary | ICD-10-CM

## 2015-02-17 LAB — INFLUENZA PANEL BY PCR (TYPE A & B)
H1N1 flu by pcr: NOT DETECTED
INFLBPCR: NEGATIVE
Influenza A By PCR: NEGATIVE

## 2015-02-17 LAB — COMPREHENSIVE METABOLIC PANEL
ALBUMIN: 2.3 g/dL — AB (ref 3.5–5.0)
ALT: 20 U/L (ref 14–54)
ANION GAP: 8 (ref 5–15)
AST: 48 U/L — ABNORMAL HIGH (ref 15–41)
Alkaline Phosphatase: 85 U/L (ref 38–126)
BUN: 8 mg/dL (ref 6–20)
CHLORIDE: 101 mmol/L (ref 101–111)
CO2: 28 mmol/L (ref 22–32)
Calcium: 8.6 mg/dL — ABNORMAL LOW (ref 8.9–10.3)
Creatinine, Ser: 1.09 mg/dL — ABNORMAL HIGH (ref 0.44–1.00)
GFR calc Af Amer: >60 mL/min (ref >60)
GFR calc non Af Amer: >60 mL/min (ref >60)
GLUCOSE: 77 mg/dL (ref 65–99)
POTASSIUM: 3.1 mmol/L — AB (ref 3.5–5.1)
SODIUM: 137 mmol/L (ref 135–145)
Total Bilirubin: 5.7 mg/dL — ABNORMAL HIGH (ref 0.3–1.2)
Total Protein: 8.3 g/dL — ABNORMAL HIGH (ref 6.5–8.1)

## 2015-02-17 LAB — CBC
HEMATOCRIT: 25.6 % — AB (ref 36.0–46.0)
HEMOGLOBIN: 8.5 g/dL — AB (ref 12.0–15.0)
MCH: 30.9 pg (ref 26.0–34.0)
MCHC: 33.2 g/dL (ref 30.0–36.0)
MCV: 93.1 fL (ref 78.0–100.0)
Platelets: 192 10*3/uL (ref 150–400)
RBC: 2.75 MIL/uL — ABNORMAL LOW (ref 3.87–5.11)
RDW: 15.9 % — ABNORMAL HIGH (ref 11.5–15.5)
WBC: 11.6 10*3/uL — ABNORMAL HIGH (ref 4.0–10.5)

## 2015-02-17 LAB — STREP PNEUMONIAE URINARY ANTIGEN: Strep Pneumo Urinary Antigen: POSITIVE — AB

## 2015-02-17 MED ORDER — VANCOMYCIN HCL IN DEXTROSE 1-5 GM/200ML-% IV SOLN
1000.0000 mg | INTRAVENOUS | Status: AC
  Administered 2015-02-17: 1000 mg via INTRAVENOUS
  Filled 2015-02-17: qty 200

## 2015-02-17 MED ORDER — FUROSEMIDE 10 MG/ML IJ SOLN
40.0000 mg | Freq: Two times a day (BID) | INTRAMUSCULAR | Status: DC
  Administered 2015-02-17 – 2015-02-24 (×16): 40 mg via INTRAVENOUS
  Filled 2015-02-17 (×18): qty 4

## 2015-02-17 MED ORDER — VANCOMYCIN HCL IN DEXTROSE 1-5 GM/200ML-% IV SOLN
1000.0000 mg | Freq: Two times a day (BID) | INTRAVENOUS | Status: DC
  Administered 2015-02-17 – 2015-02-19 (×5): 1000 mg via INTRAVENOUS
  Filled 2015-02-17 (×5): qty 200

## 2015-02-17 NOTE — Care Management Note (Signed)
Case Management Note  Patient Details  Name: Whitney Bush MRN: [id number] Date of Birth: 06-11-1970  Subjective/Objective:                  Pt is from home, lives with her mother. Pt now living in [place] states she did not keep her f/u appointments after her last DC because she could not afford the co-pay. Explained to pt that through [facility] practices she will be treated whether she can pay or not. Pt previously given MATCH voucher, most meds are on $4 list.   Action/Plan: Pt plans to return home with self care. Pt will need new f/u appointment with [facility] FC has been consulted. Will cont to follow for DC planning.   Expected Discharge Date:    02/21/2015              Expected Discharge Plan:  Home/Self Care  In-House Referral:  Financial Counselor  Discharge planning Services  CM Consult, [facility]  Post Acute Care Choice:  NA Choice offered to:  NA  DME Arranged:    DME Agency:     HH Arranged:    HH Agency:     Status of Service:  In process, will continue to follow  Medicare Important Message Given:    Date Medicare IM Given:    Medicare IM give by:    Date Additional Medicare IM Given:    Additional Medicare Important Message give by:     If discussed at [place] of Stay Meetings, dates discussed:    Additional Comments:  Sherald Barge, RN 02/17/2015, 2:18 PM

## 2015-02-17 NOTE — Progress Notes (Signed)
[facility] PROGRESS NOTE  Whitney Bush [id number] DOB: 1970/06/01    PCP:   [facility]   HPI:  44 y/o woman with h/o ETOH cirrhosis and hepatitis, who was discharged in October after a prolonged hospitalization for anasarca. ECHO with EF [id number] and no diastolic dysfunction. She states "nobody continued her fluid pills after discharge" In fact, after prescription given in hospital was completed, she never followed up with GI or called to have this refilled. She comes in today with SOB and increased edema. She has gained 80 lbs since her DC on 12/21/14. CXR shows consolidation in the right middle and right lower lobes. We are asked to admit her for PNA and anasarca. She has been given antibiotics and IV Diuresis.  She has very poor IV access, and will need line placement. She said she has been feeling better.   Rewiew of Systems:  Constitutional: Negative for malaise, fever and chills. No significant weight loss or weight gain Eyes: Negative for eye pain, redness and discharge, diplopia, visual changes, or flashes of light. ENMT: Negative for ear pain, hoarseness, nasal congestion, sinus pressure and sore throat. No headaches; tinnitus, drooling, or problem swallowing. Cardiovascular: Negative for chest pain, palpitations, diaphoresis, dyspnea and peripheral edema. ; No orthopnea, PND Respiratory: Negative for cough, hemoptysis, wheezing some bisilar rales.  Gastrointestinal: Negative for nausea, vomiting, diarrhea, constipation, abdominal pain, melena, blood in stool, hematemesis, jaundice and rectal bleeding.    Genitourinary: Negative for frequency, dysuria, incontinence,flank pain and hematuria; Musculoskeletal: Negative for back pain and neck pain. Negative for swelling and trauma.;  Skin: . Negative for pruritus, rash, abrasions, bruising and skin lesion.; ulcerations Neuro: Negative for headache, lightheadedness and neck stiffness. Negative for weakness,  altered level of consciousness , altered mental status, extremity weakness, burning feet, involuntary movement, seizure and syncope.  Psych: negative for anxiety, depression, insomnia, tearfulness, panic attacks, hallucinations, paranoia, suicidal or homicidal ideation   Past Medical History  Diagnosis Date  . Hypertension   . Depression   . Migraines   . Peripheral edema   . Alcohol abuse     Past Surgical History  Procedure Laterality Date  . Abcess drainage    . Cesarean section      Medications:  HOME MEDS: Prior to Admission medications   Medication Sig Start Date End Date Taking? Authorizing Provider  B Complex Vitamins (VITAMIN B-COMPLEX PO) Take 1 tablet by mouth daily.   Yes Historical Provider, MD  lisinopril-hydrochlorothiazide (PRINZIDE,ZESTORETIC) 20-25 MG tablet Take 1 tablet by mouth daily.   Yes Historical Provider, MD  Multiple Vitamin (MULTIVITAMIN WITH MINERALS) TABS tablet Take 1 tablet by mouth daily. 12/05/14  Yes Erline Hau, MD  spironolactone (ALDACTONE) 50 MG tablet Take 1 tablet (50 mg total) by mouth daily. 12/21/14  Yes Erline Hau, MD  vitamin E 400 UNIT capsule Take 400 Units by mouth daily.   Yes Historical Provider, MD  folic acid (FOLVITE) 1 MG tablet Take 1 tablet (1 mg total) by mouth daily. Patient not taking: Reported on 02/16/2015 12/05/14   Erline Hau, MD  pantoprazole (PROTONIX) 40 MG tablet Take 1 tablet (40 mg total) by mouth daily before breakfast. Patient not taking: Reported on 02/16/2015 12/05/14   Erline Hau, MD  prednisoLONE (ORAPRED ODT) 10 MG disintegrating tablet Take 40 mg a day for 28 days. Then take 30 mg a day for 5 days, then 20 mg a day for 5 days,  then 20 mg a day for 5 days. Then stop. Patient not taking: Reported on 02/16/2015 12/10/14   Carlis Stable, NP     Allergies:  No Known Allergies  Social History:   reports that she quit smoking about 4 years ago. Her  smoking use included Cigarettes. She has a 5 pack-year smoking history. She has quit using smokeless tobacco. Her smokeless tobacco use included Snuff. She reports that she drinks about 2.4 oz of alcohol per week. She reports that she does not use illicit drugs.  Family History: Family History  Problem Relation Age of Onset  . Diabetes Father   . Cancer Other   . Cervical cancer Maternal Grandmother   . Lung cancer Maternal Grandfather   . Alcohol abuse Other     multiple family members  . Colon cancer Neg Hx   . Liver disease Neg Hx      Physical Exam: Filed Vitals:   02/16/15 2300 02/17/15 0526 02/17/15 0527 02/17/15 1406  BP:   132/70 124/93  Pulse:   100 110  Temp:   98.2 F (36.8 C) 97.9 F (36.6 C)  TempSrc:   Oral Oral  Resp:   20 20  Height:      Weight:  125.238 kg (276 lb 1.6 oz)    SpO2: 100%  100% 100%   Blood pressure 124/93, pulse 110, temperature 97.9 F (36.6 C), temperature source Oral, resp. rate 20, height 5\' 3"  (1.6 m), weight 125.238 kg (276 lb 1.6 oz), last menstrual period 02/14/2015, SpO2 100 %.  GEN:  Pleasant patient lying in the stretcher in no acute distress; cooperative with exam. PSYCH:  alert and oriented x4; does not appear anxious or depressed; affect is appropriate. HEENT: Mucous membranes pink and anicteric; PERRLA; EOM intact; no cervical lymphadenopathy nor thyromegaly or carotid bruit; no JVD; There were no stridor. Neck is very supple. Breasts:: Not examined CHEST WALL: No tenderness CHEST: Normal respiration, slight wheezing and some basilar rales.  HEART: Regular rate and rhythm.  There are no murmur, rub, or gallops.   BACK: No kyphosis or scoliosis; no CVA tenderness ABDOMEN: soft and non-tender; no masses, no organomegaly, normal abdominal bowel sounds; no pannus; no intertriginous candida. There is no rebound and no distention. Rectal Exam: Not done EXTREMITIES: No bone or joint deformity; age-appropriate arthropathy of the hands  and knees; no edema; no ulcerations.  There is no calf tenderness. Genitalia: not examined PULSES: 2+ and symmetric SKIN: Normal hydration no rash or ulceration CNS: Cranial nerves 2-12 grossly intact no focal lateralizing neurologic deficit.  Speech is fluent; uvula elevated with phonation, facial symmetry and tongue midline. DTR are normal bilaterally, cerebella exam is intact, barbinski is negative and strengths are equaled bilaterally.  No sensory loss.   Labs on Admission:  Basic Metabolic Panel:  Recent Labs Lab 02/16/15 1555 02/17/15 0728  NA 139 137  K 3.5 3.1*  CL 109 101  CO2 24 28  GLUCOSE 103* 77  BUN 9 8  CREATININE 1.02* 1.09*  CALCIUM 8.1* 8.6*   Liver Function Tests:  Recent Labs Lab 02/16/15 1555 02/17/15 0728  AST 45* 48*  ALT 18 20  ALKPHOS 92 85  BILITOT 3.9* 5.7*  PROT 7.7 8.3*  ALBUMIN 2.1* 2.3*    Recent Labs Lab 02/16/15 1555  LIPASE 53*    Recent Labs Lab 02/16/15 1614  AMMONIA 53*   CBC:  Recent Labs Lab 02/16/15 1555 02/17/15 0728  WBC 9.8 11.6*  NEUTROABS  5.5  --   HGB 8.0* 8.5*  HCT 24.6* 25.6*  MCV 92.8 93.1  PLT 174 192   Cardiac Enzymes:  Recent Labs Lab 02/16/15 1555  TROPONINI <0.03   Radiological Exams on Admission: Dg Chest 2 View  02/16/2015  CLINICAL DATA:  Shortness of breath and nonproductive cough for 3 days EXAM: CHEST - 2 VIEW COMPARISON:  12/10/2014 FINDINGS: Cardiac shadow is stable. Elevation the right hemidiaphragm is noted with consolidation in the right middle and right lower lobe. No bony abnormality is noted. IMPRESSION: Consolidation in the right middle and right lower lobes. Followup PA and lateral chest X-ray is recommended in 3-4 weeks following trial of antibiotic therapy to ensure resolution and exclude underlying malignancy. Electronically Signed   By: Inez Catalina M.D.   On: 02/16/2015 16:46   Assessment/Plan Present on Admission:  . Alcohol abuse . Essential hypertension . Cirrhosis  ([place]) Tobacco abuse Multilobar PNA./   PLAN:  PNA:  Will continue with IV antibiotics.  She is doing a little better.  ANASARCA:  Continue with aldactone and IV diuresis.    Alcohol abuse:   She has been trying to quit but " fell off the wagon" .  She is stable.  She is still trying to give up.  HTN:  Stable,   BP is controlled.  Will continue with meds.   Other plans as per orders. Code Status: FULL Haskel Khan, MD.  FACP [facility] Pager [phone] 7pm to 7am.  02/17/2015, 3:37 PM

## 2015-02-18 LAB — HIV ANTIBODY (ROUTINE TESTING W REFLEX): HIV Screen 4th Generation wRfx: NONREACTIVE

## 2015-02-18 LAB — BASIC METABOLIC PANEL
ANION GAP: 7 (ref 5–15)
BUN: 8 mg/dL (ref 6–20)
CHLORIDE: 97 mmol/L — AB (ref 101–111)
CO2: 29 mmol/L (ref 22–32)
Calcium: 8.3 mg/dL — ABNORMAL LOW (ref 8.9–10.3)
Creatinine, Ser: 1.06 mg/dL — ABNORMAL HIGH (ref 0.44–1.00)
GFR calc non Af Amer: >60 mL/min (ref >60)
Glucose, Bld: 114 mg/dL — ABNORMAL HIGH (ref 65–99)
POTASSIUM: 3.4 mmol/L — AB (ref 3.5–5.1)
Sodium: 133 mmol/L — ABNORMAL LOW (ref 135–145)

## 2015-02-18 LAB — LEGIONELLA ANTIGEN, URINE

## 2015-02-18 MED ORDER — LACTULOSE 10 GM/15ML PO SOLN
30.0000 g | Freq: Every day | ORAL | Status: DC
  Administered 2015-02-18 – 2015-02-25 (×8): 30 g via ORAL
  Filled 2015-02-18 (×9): qty 60

## 2015-02-18 MED ORDER — POTASSIUM CHLORIDE CRYS ER 20 MEQ PO TBCR
40.0000 meq | EXTENDED_RELEASE_TABLET | Freq: Once | ORAL | Status: AC
  Administered 2015-02-18: 40 meq via ORAL
  Filled 2015-02-18: qty 2

## 2015-02-18 MED ORDER — SPIRONOLACTONE 25 MG PO TABS
100.0000 mg | ORAL_TABLET | Freq: Every day | ORAL | Status: DC
  Administered 2015-02-18 – 2015-02-24 (×7): 100 mg via ORAL
  Filled 2015-02-18 (×6): qty 4

## 2015-02-18 MED ORDER — METRONIDAZOLE 500 MG PO TABS
2000.0000 mg | ORAL_TABLET | Freq: Once | ORAL | Status: AC
  Administered 2015-02-18: 2000 mg via ORAL
  Filled 2015-02-18: qty 4

## 2015-02-18 NOTE — Progress Notes (Signed)
ANTIBIOTIC CONSULT NOTE   Pharmacy Consult for vancomycin Indication: pneumonia  No Known Allergies  Patient Measurements: Height: 5\' 3"  (160 cm) Weight: 271 lb 3.2 oz (123.016 kg) IBW/kg (Calculated) : 52.4  Vital Signs: Temp: 98.5 F (36.9 C) (12/22 0615) Temp Source: Oral (12/22 0615) BP: 112/81 mmHg (12/22 0615) Pulse Rate: 118 (12/22 0615) Intake/Output from previous day: 12/21 0701 - 12/22 0700 In: 360 [P.O.:360] Out: 3500 [Urine:3500] Intake/Output from this shift: Total I/O In: 240 [P.O.:240] Out: -   Labs:  Recent Labs  02/16/15 1555 02/17/15 0728 02/18/15 0836  WBC 9.8 11.6*  --   HGB 8.0* 8.5*  --   PLT 174 192  --   CREATININE 1.02* 1.09* 1.06*   Estimated Creatinine Clearance: 88 mL/min (by C-G formula based on Cr of 1.06). No results for input(s): VANCOTROUGH, VANCOPEAK, VANCORANDOM, GENTTROUGH, GENTPEAK, GENTRANDOM, TOBRATROUGH, TOBRAPEAK, TOBRARND, AMIKACINPEAK, AMIKACINTROU, AMIKACIN in the last 72 hours.   Microbiology: Recent Results (from the past 720 hour(s))  Blood culture (routine x 2)     Status: None (Preliminary result)   Collection Time: 02/16/15  5:15 PM  Result Value Ref Range Status   Specimen Description BLOOD LEFT ARM  Final   Special Requests BOTTLES DRAWN AEROBIC AND ANAEROBIC 4CC EACH  Final   Culture NO GROWTH 2 DAYS  Final   Report Status PENDING  Incomplete  Blood culture (routine x 2)     Status: None (Preliminary result)   Collection Time: 02/16/15  5:20 PM  Result Value Ref Range Status   Specimen Description BLOOD LEFT HAND  Final   Special Requests BOTTLES DRAWN AEROBIC ONLY 4CC  Final   Culture NO GROWTH 2 DAYS  Final   Report Status PENDING  Incomplete   Medical History: Past Medical History  Diagnosis Date  . Hypertension   . Depression   . Migraines   . Peripheral edema   . Alcohol abuse    Anti-infectives    Start     Dose/Rate Route Frequency Ordered Stop   02/17/15 2200  vancomycin (VANCOCIN) IVPB  1000 mg/200 mL premix     1,000 mg 200 mL/hr over 60 Minutes Intravenous Every 12 hours 02/17/15 1054     02/17/15 0915  vancomycin (VANCOCIN) IVPB 1000 mg/200 mL premix     1,000 mg 200 mL/hr over 60 Minutes Intravenous Every 1 hr x 2 02/17/15 0838 02/17/15 1153   02/17/15 0200  vancomycin (VANCOCIN) 1,250 mg in sodium chloride 0.9 % 250 mL IVPB  Status:  Discontinued     1,250 mg 166.7 mL/hr over 90 Minutes Intravenous Every 8 hours 02/16/15 1733 02/16/15 2040   02/16/15 2200  ceFEPIme (MAXIPIME) 1 g in dextrose 5 % 50 mL IVPB     1 g 100 mL/hr over 30 Minutes Intravenous 3 times per day 02/16/15 2040 02/24/15 2159   02/16/15 1745  vancomycin (VANCOCIN) IVPB 1000 mg/200 mL premix     1,000 mg 200 mL/hr over 60 Minutes Intravenous Every 1 hr x 2 02/16/15 1732 02/16/15 2029   02/16/15 1700  cefTRIAXone (ROCEPHIN) 1 g in dextrose 5 % 50 mL IVPB  Status:  Discontinued     1 g 100 mL/hr over 30 Minutes Intravenous  Once 02/16/15 1652 02/16/15 1701   02/16/15 1700  azithromycin (ZITHROMAX) 500 mg in dextrose 5 % 250 mL IVPB  Status:  Discontinued     500 mg 250 mL/hr over 60 Minutes Intravenous  Once 02/16/15 1652 02/16/15 1701  02/16/15 1700  piperacillin-tazobactam (ZOSYN) IVPB 3.375 g     3.375 g 100 mL/hr over 30 Minutes Intravenous  Once 02/16/15 1655 02/16/15 1816     Assessment: 44 yo lady who was started on vancomycin for PNA.  She has a h/o cirrhosis and renal insufficiency.  SCr is currently stable.  Pt is obese, Normalized clcr ~ 75-36ml/min. Clarified ABX Rx with Dr Marin Comment yesterday.  Pt w/ poor IV access and increased SOB.   Plan is to place line for better IV access.  Pt has no known ABX allergies. Pt has h/o anasarca, ETOH cirrhosis and has reportedly gained 80 lbs since her last hospitalization & discharge on 12/21/14 (which accounts for a large portion of her BW)  Goal of Therapy:  Vancomycin trough level 15-20 mcg/ml  Plan:  Vancomycin 1000mg  IV q12hrs Will check  Vancomycin trough level tomorrow am to evaluate Vd and clearance Continue Cefepime 1gm IV q8h F/u renal function , cultures and clinical course  Thanks for allowing pharmacy to be a part of this patient's care.  Hart Robinsons, PharmD Clinical Pharmacist 02/18/2015,10:19 AM

## 2015-02-18 NOTE — Progress Notes (Addendum)
[facility] PROGRESS NOTE   Whitney Bush [id number] DOB: 1970/05/26 DOA: 02/16/2015 PCP: [place]   Subjective: Reported feeling okay, no bowel movement for the past 2 days. No other complaints.  HPI: 44 y/o woman with h/o ETOH cirrhosis and hepatitis, who was discharged in October after a prolonged hospitalization for anasarca. ECHO with EF 123456 and no diastolic dysfunction. She states "nobody continued her fluid pills after discharge" In fact, after prescription given in hospital was completed, she never followed up with GI or called to have this refilled. She comes in today with SOB and increased edema. She has gained 80 lbs since her DC on 12/21/14. CXR shows consolidation in the right middle and right lower lobes. We are asked to admit her for PNA and anasarca. She has been given antibiotics and IV Diuresis. She has very poor IV access, and will need line placement. She said she has been feeling better.   Assessment/Plan: Principal Problem:   HCAP (healthcare-associated pneumonia) Active Problems:   Essential hypertension   Alcohol abuse   Cirrhosis ([place])   Anasarca   Dyspnea   Healthcare associated pneumonia Patient came into the hospital because of shortness of breath and cough, CXR showed RML and RLL pneumonia. This is treated as healthcare associated pneumonia, patient was recently in the hospital. Started on cefepime and IV vancomycin, positive for Streptococcus urinary antigen Supportive management with bronchodilators, mucolytics, antitussives and oxygen as needed.  Anasarca and fluid overload Patient does have hepatic cirrhosis, was recently in the hospital for some reason she was not taking her diuretics. Massive anasarca and over 60 pounds weight gain since last discharge. Discharge with weight of 218 pounds admitted with a weight of 278. Started on aggressive IV diuresis with Lasix, added Aldactone for potassium  sparing. Follow BMP closely.  Cirrhosis Parent with hypoalbuminemia, hyponatremia and coagulopathy, INR is 1.87. Added low dose of lactulose as patient reported constipation. Patient has slightly elevated ammonia level of 53 on admission.  Trichomoniasis Urine is positive for trichomonas, 2 g of Flagyl as single dose will be given. Negative screening for HIV.  Alcohol drinking Patient denies recent alcohol drinking, on thiamine and folate.  Hypokalemia Potassium of 3.4, this is likely secondary to aggressive diuresis, replete with oral supplements.  Code Status: Full Code Family Communication: Plan discussed with the patient. Disposition Plan: Remains inpatient Diet: Diet Heart Room service appropriate?: Yes; Fluid consistency:: Thin  Consultants:  None  Procedures:  None  Antibiotics:  Cefepime and vancomycin, one dose of Flagyl.    Objective: Filed Vitals:   02/17/15 2059 02/18/15 0615  BP: 154/92 112/81  Pulse: 125 118  Temp: 98.6 F (37 C) 98.5 F (36.9 C)  Resp: 20 20    Intake/Output Summary (Last 24 hours) at 02/18/15 1039 Last data filed at 02/18/15 0800  Gross per 24 hour  Intake    600 ml  Output   2700 ml  Net  -2100 ml   Filed Weights   02/16/15 2118 02/17/15 0526 02/18/15 0615  Weight: 127.02 kg (280 lb 0.5 oz) 125.238 kg (276 lb 1.6 oz) 123.016 kg (271 lb 3.2 oz)    Exam: General: Alert and awake, oriented x3, not in any acute distress. HEENT: anicteric sclera, pupils reactive to light and accommodation, EOMI CVS: S1-S2 clear, no murmur rubs or gallops Chest: clear to auscultation bilaterally, no wheezing, rales or rhonchi Abdomen: soft nontender, nondistended, normal bowel sounds, no organomegaly Extremities: no cyanosis, clubbing or edema  noted bilaterally Neuro: Cranial nerves II-XII intact, no focal neurological deficits  Data Reviewed: Basic Metabolic Panel:  Recent Labs Lab 02/16/15 1555 02/17/15 0728 02/18/15 0836  NA  139 137 133*  K 3.5 3.1* 3.4*  CL 109 101 97*  CO2 24 28 29   GLUCOSE 103* 77 114*  BUN 9 8 8   CREATININE 1.02* 1.09* 1.06*  CALCIUM 8.1* 8.6* 8.3*   Liver Function Tests:  Recent Labs Lab 02/16/15 1555 02/17/15 0728  AST 45* 48*  ALT 18 20  ALKPHOS 92 85  BILITOT 3.9* 5.7*  PROT 7.7 8.3*  ALBUMIN 2.1* 2.3*    Recent Labs Lab 02/16/15 1555  LIPASE 53*    Recent Labs Lab 02/16/15 1614  AMMONIA 53*   CBC:  Recent Labs Lab 02/16/15 1555 02/17/15 0728  WBC 9.8 11.6*  NEUTROABS 5.5  --   HGB 8.0* 8.5*  HCT 24.6* 25.6*  MCV 92.8 93.1  PLT 174 192   Cardiac Enzymes:  Recent Labs Lab 02/16/15 1555  TROPONINI <0.03   BNP (last 3 results)  Recent Labs  07/08/14 0912 02/16/15 1555  BNP 22.0 242.0*    ProBNP (last 3 results) No results for input(s): PROBNP in the last 8760 hours.  CBG: No results for input(s): GLUCAP in the last 168 hours.  Micro Recent Results (from the past 240 hour(s))  Blood culture (routine x 2)     Status: None (Preliminary result)   Collection Time: 02/16/15  5:15 PM  Result Value Ref Range Status   Specimen Description BLOOD LEFT ARM  Final   Special Requests BOTTLES DRAWN AEROBIC AND ANAEROBIC 4CC EACH  Final   Culture NO GROWTH 2 DAYS  Final   Report Status PENDING  Incomplete  Blood culture (routine x 2)     Status: None (Preliminary result)   Collection Time: 02/16/15  5:20 PM  Result Value Ref Range Status   Specimen Description BLOOD LEFT HAND  Final   Special Requests BOTTLES DRAWN AEROBIC ONLY 4CC  Final   Culture NO GROWTH 2 DAYS  Final   Report Status PENDING  Incomplete     Studies: Dg Chest 2 View  02/16/2015  CLINICAL DATA:  Shortness of breath and nonproductive cough for 3 days EXAM: CHEST - 2 VIEW COMPARISON:  12/10/2014 FINDINGS: Cardiac shadow is stable. Elevation the right hemidiaphragm is noted with consolidation in the right middle and right lower lobe. No bony abnormality is noted. IMPRESSION:  Consolidation in the right middle and right lower lobes. Followup PA and lateral chest X-ray is recommended in 3-4 weeks following trial of antibiotic therapy to ensure resolution and exclude underlying malignancy. Electronically Signed   By: Inez Catalina M.D.   On: 02/16/2015 16:46   Dg Chest Port 1 View  02/17/2015  CLINICAL DATA:  Right PICC line placement. EXAM: PORTABLE CHEST 1 VIEW COMPARISON:  02/16/2015. FINDINGS: The right PICC line tip is in the mid SVC just above the level of the carina. No complicating features. Persistent right pleural effusion and overlying atelectasis. The left lung is relatively clear. IMPRESSION: The right PICC line tip is in the mid SVC. Persistent right effusion and atelectasis. Electronically Signed   By: Marijo Sanes M.D.   On: 02/17/2015 19:38    Scheduled Meds: . ceFEPime (MAXIPIME) IV  1 g Intravenous 3 times per day  . folic acid  1 mg Oral Daily  . furosemide  40 mg Intravenous BID  . heparin  5,000 Units Subcutaneous 3  times per day  . multivitamin with minerals  1 tablet Oral Daily  . pantoprazole  40 mg Oral QAC breakfast  . sodium chloride  3 mL Intravenous Q12H  . spironolactone  100 mg Oral Daily  . thiamine  100 mg Oral Daily  . vancomycin  1,000 mg Intravenous Q12H   Continuous Infusions:      Time spent: 35 minutes    [facility] A  [facility] Pager [phone] If 7PM-7AM, please contact night-coverage at [email or web address], password [facility] 02/18/2015, 10:39 AM  LOS: 2 days

## 2015-02-18 NOTE — Progress Notes (Signed)
Patient reports passing loose stool, sitting at bedside, reports urine dark, did not save for inspection.

## 2015-02-18 NOTE — Progress Notes (Signed)
Bilateral legs tight and edematous.  Requested pain medication, given.  Will continue to monitor.

## 2015-02-19 DIAGNOSIS — E876 Hypokalemia: Secondary | ICD-10-CM

## 2015-02-19 LAB — BASIC METABOLIC PANEL
Anion gap: 7 (ref 5–15)
BUN: 8 mg/dL (ref 6–20)
CALCIUM: 8.3 mg/dL — AB (ref 8.9–10.3)
CO2: 31 mmol/L (ref 22–32)
CREATININE: 1 mg/dL (ref 0.44–1.00)
Chloride: 96 mmol/L — ABNORMAL LOW (ref 101–111)
GFR calc non Af Amer: >60 mL/min (ref >60)
Glucose, Bld: 88 mg/dL (ref 65–99)
Potassium: 3.4 mmol/L — ABNORMAL LOW (ref 3.5–5.1)
SODIUM: 134 mmol/L — AB (ref 135–145)

## 2015-02-19 LAB — EXPECTORATED SPUTUM ASSESSMENT W GRAM STAIN, RFLX TO RESP C

## 2015-02-19 LAB — EXPECTORATED SPUTUM ASSESSMENT W REFEX TO RESP CULTURE

## 2015-02-19 LAB — VANCOMYCIN, TROUGH: Vancomycin Tr: 20 ug/mL (ref 10.0–20.0)

## 2015-02-19 MED ORDER — GUAIFENESIN ER 600 MG PO TB12
1200.0000 mg | ORAL_TABLET | Freq: Two times a day (BID) | ORAL | Status: DC
Start: 2015-02-19 — End: 2015-02-25
  Administered 2015-02-19 – 2015-02-25 (×13): 1200 mg via ORAL
  Filled 2015-02-19 (×14): qty 2

## 2015-02-19 MED ORDER — GUAIFENESIN-DM 100-10 MG/5ML PO SYRP: 5.0000 mL | ORAL_SOLUTION | ORAL | Status: DC | PRN

## 2015-02-19 MED ORDER — MAGNESIUM SULFATE 2 GM/50ML IV SOLN
2.0000 g | Freq: Once | INTRAVENOUS | Status: AC
  Administered 2015-02-19: 2 g via INTRAVENOUS
  Filled 2015-02-19: qty 50

## 2015-02-19 MED ORDER — POTASSIUM CHLORIDE CRYS ER 20 MEQ PO TBCR
40.0000 meq | EXTENDED_RELEASE_TABLET | Freq: Four times a day (QID) | ORAL | Status: AC
  Administered 2015-02-19 (×2): 40 meq via ORAL
  Filled 2015-02-19 (×2): qty 2

## 2015-02-19 NOTE — Care Management Note (Signed)
Case Management Note  Patient Details  Name: Whitney Bush MRN: [id number] Date of Birth: [birth year]  Expected Discharge Date:     02/20/2015             Expected Discharge Plan:  Home/Self Care  In-House Referral:  Financial Counselor  Discharge planning Services  CM Consult, [facility]  Post Acute Care Choice:  NA Choice offered to:  NA  DME Arranged:    DME Agency:     HH Arranged:    [place] Agency:     Status of Service:  Completed, signed off  Medicare Important Message Given:    Date Medicare IM Given:    Medicare IM give by:    Date Additional Medicare IM Given:    Additional Medicare Important Message give by:     If discussed at [place] of Stay Meetings, dates discussed:    Additional Comments: [place] home over weekend. Pt hesitant. Pt understand she is not eligible for [facility] voucher, most/if not all of pt's DC meds will be on $4 list, pt is aware. [facility] is closed today and f/u appointment was unable to be made, pt verbalized understanding and will call next week to make f/u appointment. Pt encouraged to keep all of her f/u appointments with specialist. No further CM needs.   Sherald Barge, RN 02/19/2015, 2:03 PM

## 2015-02-19 NOTE — Progress Notes (Signed)
[facility] PROGRESS NOTE   Whitney Bush [id number] DOB: January 01, 1971 DOA: 02/16/2015 PCP: [place]   Subjective: Feels much better today, cough continues with minimal sputum production. No fever or chills.  HPI: 44 y/o woman with h/o ETOH cirrhosis and hepatitis, who was discharged in October after a prolonged hospitalization for anasarca. ECHO with EF 123456 and no diastolic dysfunction. She states "nobody continued her fluid pills after discharge" In fact, after prescription given in hospital was completed, she never followed up with GI or called to have this refilled. She comes in today with SOB and increased edema. She has gained 80 lbs since her DC on 12/21/14. CXR shows consolidation in the right middle and right lower lobes. We are asked to admit her for PNA and anasarca. She has been given antibiotics and IV Diuresis. She has very poor IV access, and will need line placement. She said she has been feeling better.   Assessment/Plan: Principal Problem:   HCAP (healthcare-associated pneumonia) Active Problems:   Essential hypertension   Alcohol abuse   Cirrhosis ([place])   Anasarca   Dyspnea   Hypokalemia   Healthcare associated pneumonia Patient came into the hospital because of shortness of breath and cough, CXR showed RML and RLL pneumonia. This is treated as healthcare associated pneumonia, patient was recently in the hospital. Started on cefepime and IV vancomycin, positive for Streptococcus urinary antigen Supportive management with bronchodilators, mucolytics, antitussives and oxygen as needed. Added Mucinex, can probably be discharged in a.m. on Levaquin or Augmentin  Anasarca and fluid overload Patient does have hepatic cirrhosis, was recently in the hospital, and for some reason she was not taking her diuretics. Massive anasarca and over 60 pounds weight gain since last discharge. Discharge with weight of 218 pounds admitted with a  weight of 278. Started on aggressive IV diuresis with Lasix, added Aldactone for potassium sparing. Follow BMP closely. Can probably be discharged home in a.m. on 40 mg of Lasix twice a day, Aldactone 100 mg twice a day. Patient is still has some room for diuresis, but this is going to take a long time. Diuretics can be adjusted as outpatient.  Cirrhosis Parent with hypoalbuminemia, hyponatremia and coagulopathy, INR is 1.87. Added low dose of lactulose as patient reported constipation, she had bowel movement yesterday. Patient has slightly elevated ammonia level of 53 on admission.  Trichomoniasis Urine is positive for trichomonas, 2 g of Flagyl as single dose will be given. Negative screening for HIV.  Alcohol drinking Patient denies recent alcohol drinking, on thiamine and folate.  Hypokalemia Potassium of 3.4, this is likely secondary to aggressive diuresis, replete with oral supplements. Potassium again at 3.4 today, gave total of 80 mEq of potassium, check BMP in a.m.   Code Status: Full Code Family Communication: Plan discussed with the patient. Disposition Plan: Remains inpatient Diet: Diet Heart Room service appropriate?: Yes; Fluid consistency:: Thin  Consultants:  None  Procedures:  None  Antibiotics:  Cefepime and vancomycin, one dose of Flagyl.    Objective: Filed Vitals:   02/18/15 2208 02/19/15 0500  BP: 124/86 126/68  Pulse: 108 110  Temp: 98.6 F (37 C) 98.4 F (36.9 C)  Resp: 20 20    Intake/Output Summary (Last 24 hours) at 02/19/15 1131 Last data filed at 02/19/15 [id number]  Gross per 24 hour  Intake    480 ml  Output   3100 ml  Net  -2620 ml   Filed Weights   02/17/15 0526  02/18/15 0615 02/19/15 0500  Weight: 125.238 kg (276 lb 1.6 oz) 123.016 kg (271 lb 3.2 oz) 120.249 kg (265 lb 1.6 oz)    Exam: General: Alert and awake, oriented x3, not in any acute distress. HEENT: anicteric sclera, pupils reactive to light and accommodation,  EOMI CVS: S1-S2 clear, no murmur rubs or gallops Chest: clear to auscultation bilaterally, no wheezing, rales or rhonchi Abdomen: soft nontender, nondistended, normal bowel sounds, no organomegaly Extremities: no cyanosis, clubbing or edema noted bilaterally Neuro: Cranial nerves II-XII intact, no focal neurological deficits  Data Reviewed: Basic Metabolic Panel:  Recent Labs Lab 02/16/15 1555 02/17/15 0728 02/18/15 0836 02/19/15 0630  NA 139 137 133* 134*  K 3.5 3.1* 3.4* 3.4*  CL 109 101 97* 96*  CO2 24 28 29 31   GLUCOSE 103* 77 114* 88  BUN 9 8 8 8   CREATININE 1.02* 1.09* 1.06* 1.00  CALCIUM 8.1* 8.6* 8.3* 8.3*   Liver Function Tests:  Recent Labs Lab 02/16/15 1555 02/17/15 0728  AST 45* 48*  ALT 18 20  ALKPHOS 92 85  BILITOT 3.9* 5.7*  PROT 7.7 8.3*  ALBUMIN 2.1* 2.3*    Recent Labs Lab 02/16/15 1555  LIPASE 53*    Recent Labs Lab 02/16/15 1614  AMMONIA 53*   CBC:  Recent Labs Lab 02/16/15 1555 02/17/15 0728  WBC 9.8 11.6*  NEUTROABS 5.5  --   HGB 8.0* 8.5*  HCT 24.6* 25.6*  MCV 92.8 93.1  PLT 174 192   Cardiac Enzymes:  Recent Labs Lab 02/16/15 1555  TROPONINI <0.03   BNP (last 3 results)  Recent Labs  07/08/14 0912 02/16/15 1555  BNP 22.0 242.0*    ProBNP (last 3 results) No results for input(s): PROBNP in the last 8760 hours.  CBG: No results for input(s): GLUCAP in the last 168 hours.  Micro Recent Results (from the past 240 hour(s))  Blood culture (routine x 2)     Status: None (Preliminary result)   Collection Time: 02/16/15  5:15 PM  Result Value Ref Range Status   Specimen Description BLOOD LEFT ARM  Final   Special Requests BOTTLES DRAWN AEROBIC AND ANAEROBIC 4CC EACH  Final   Culture NO GROWTH 3 DAYS  Final   Report Status PENDING  Incomplete  Blood culture (routine x 2)     Status: None (Preliminary result)   Collection Time: 02/16/15  5:20 PM  Result Value Ref Range Status   Specimen Description BLOOD  LEFT HAND  Final   Special Requests BOTTLES DRAWN AEROBIC ONLY 4CC  Final   Culture NO GROWTH 3 DAYS  Final   Report Status PENDING  Incomplete  Culture, sputum-assessment     Status: None   Collection Time: 02/19/15  8:38 AM  Result Value Ref Range Status   Specimen Description SPU  Final   Special Requests NONE  Final   Sputum evaluation THIS SPECIMEN IS ACCEPTABLE FOR SPUTUM CULTURE  Final   Report Status 02/19/2015 FINAL  Final     Studies: Dg Chest Port 1 View  02/17/2015  CLINICAL DATA:  Right PICC line placement. EXAM: PORTABLE CHEST 1 VIEW COMPARISON:  02/16/2015. FINDINGS: The right PICC line tip is in the mid SVC just above the level of the carina. No complicating features. Persistent right pleural effusion and overlying atelectasis. The left lung is relatively clear. IMPRESSION: The right PICC line tip is in the mid SVC. Persistent right effusion and atelectasis. Electronically Signed   By: Marijo Sanes  M.D.   On: 02/17/2015 19:38    Scheduled Meds: . ceFEPime (MAXIPIME) IV  1 g Intravenous 3 times per day  . folic acid  1 mg Oral Daily  . furosemide  40 mg Intravenous BID  . guaiFENesin  1,200 mg Oral BID  . heparin  5,000 Units Subcutaneous 3 times per day  . lactulose  30 g Oral Daily  . multivitamin with minerals  1 tablet Oral Daily  . pantoprazole  40 mg Oral QAC breakfast  . potassium chloride  40 mEq Oral Q6H  . spironolactone  100 mg Oral Daily  . thiamine  100 mg Oral Daily  . vancomycin  1,000 mg Intravenous Q12H   Continuous Infusions:      Time spent: 35 minutes    [facility]  [facility] Pager [phone] If 7PM-7AM, please contact night-coverage at [email or web address], password [facility] 02/19/2015, 11:31 AM  LOS: 3 days

## 2015-02-19 NOTE — Progress Notes (Signed)
ANTIBIOTIC CONSULT NOTE   Pharmacy Consult for vancomycin Indication: pneumonia  No Known Allergies  Patient Measurements: Height: 5\' 3"  (160 cm) Weight: 265 lb 1.6 oz (120.249 kg) IBW/kg (Calculated) : 52.4  Vital Signs: Temp: 98.4 F (36.9 C) (12/23 0500) Temp Source: Oral (12/23 0500) BP: 126/68 mmHg (12/23 0500) Pulse Rate: 110 (12/23 0500) Intake/Output from previous day: 12/22 0701 - 12/23 0700 In: 720 [P.O.:720] Out: 3100 [Urine:3100] Intake/Output from this shift:    Labs:  Recent Labs  02/16/15 1555 02/17/15 0728 02/18/15 0836 02/19/15 0630  WBC 9.8 11.6*  --   --   HGB 8.0* 8.5*  --   --   PLT 174 192  --   --   CREATININE 1.02* 1.09* 1.06* 1.00   Estimated Creatinine Clearance: 92 mL/min (by C-G formula based on Cr of 1).  Recent Labs  02/19/15 0849  [facility] 20     Microbiology: Recent Results (from the past 720 hour(s))  Blood culture (routine x 2)     Status: None (Preliminary result)   Collection Time: 02/16/15  5:15 PM  Result Value Ref Range Status   Specimen Description BLOOD LEFT ARM  Final   Special Requests BOTTLES DRAWN AEROBIC AND ANAEROBIC 4CC EACH  Final   Culture NO GROWTH 3 DAYS  Final   Report Status PENDING  Incomplete  Blood culture (routine x 2)     Status: None (Preliminary result)   Collection Time: 02/16/15  5:20 PM  Result Value Ref Range Status   Specimen Description BLOOD LEFT HAND  Final   Special Requests BOTTLES DRAWN AEROBIC ONLY 4CC  Final   Culture NO GROWTH 3 DAYS  Final   Report Status PENDING  Incomplete  Culture, sputum-assessment     Status: None   Collection Time: 02/19/15  8:38 AM  Result Value Ref Range Status   Specimen Description SPU  Final   Special Requests NONE  Final   Sputum evaluation THIS SPECIMEN IS ACCEPTABLE FOR SPUTUM CULTURE  Final   Report Status 02/19/2015 FINAL  Final   Medical History: Past Medical History  Diagnosis Date  . Hypertension   . Depression   . Migraines   .  Peripheral edema   . Alcohol abuse    Anti-infectives    Start     Dose/Rate Route Frequency Ordered Stop   02/18/15 1130  metroNIDAZOLE (FLAGYL) tablet 2,000 mg     2,000 mg Oral  Once 02/18/15 1058 02/18/15 1141   02/17/15 2200  vancomycin (VANCOCIN) IVPB 1000 mg/200 mL premix     1,000 mg 200 mL/hr over 60 Minutes Intravenous Every 12 hours 02/17/15 1054     02/17/15 0915  vancomycin (VANCOCIN) IVPB 1000 mg/200 mL premix     1,000 mg 200 mL/hr over 60 Minutes Intravenous Every 1 hr x 2 02/17/15 0838 02/17/15 1153   02/17/15 0200  vancomycin (VANCOCIN) 1,250 mg in sodium chloride 0.9 % 250 mL IVPB  Status:  Discontinued     1,250 mg 166.7 mL/hr over 90 Minutes Intravenous Every 8 hours 02/16/15 1733 02/16/15 2040   02/16/15 2200  ceFEPIme (MAXIPIME) 1 g in dextrose 5 % 50 mL IVPB     1 g 100 mL/hr over 30 Minutes Intravenous 3 times per day 02/16/15 2040 02/24/15 2159   02/16/15 1745  vancomycin (VANCOCIN) IVPB 1000 mg/200 mL premix     1,000 mg 200 mL/hr over 60 Minutes Intravenous Every 1 hr x 2 02/16/15 1732 02/16/15 2029  02/16/15 1700  cefTRIAXone (ROCEPHIN) 1 g in dextrose 5 % 50 mL IVPB  Status:  Discontinued     1 g 100 mL/hr over 30 Minutes Intravenous  Once 02/16/15 1652 02/16/15 1701   02/16/15 1700  azithromycin (ZITHROMAX) 500 mg in dextrose 5 % 250 mL IVPB  Status:  Discontinued     500 mg 250 mL/hr over 60 Minutes Intravenous  Once 02/16/15 1652 02/16/15 1701   02/16/15 1700  piperacillin-tazobactam (ZOSYN) IVPB 3.375 g     3.375 g 100 mL/hr over 30 Minutes Intravenous  Once 02/16/15 1655 02/16/15 1816     Assessment: 44 yo lady who was started on vancomycin for PNA.  She has a h/o cirrhosis and renal insufficiency.  SCr is currently stable.  Pt is obese, Normalized clcr ~ 75-23ml/min.  Trough level is on target. Pt has no known ABX allergies.  Blood cx's pending.  Goal of Therapy:  Vancomycin trough level 15-20 mcg/ml  Plan:  Continue Vancomycin 1000mg  IV  q12hrs Check trough level weekly or sooner if warranted Continue Cefepime 1gm IV q8h Monitor renal function , cultures and clinical course  Thanks for allowing pharmacy to be a part of this patient's care.  Hart Robinsons, PharmD Clinical Pharmacist 02/19/2015,10:36 AM

## 2015-02-20 LAB — HEPATIC FUNCTION PANEL
ALBUMIN: 2 g/dL — AB (ref 3.5–5.0)
ALT: 18 U/L (ref 14–54)
AST: 44 U/L — AB (ref 15–41)
Alkaline Phosphatase: 78 U/L (ref 38–126)
BILIRUBIN TOTAL: 4 mg/dL — AB (ref 0.3–1.2)
Bilirubin, Direct: 1.8 mg/dL — ABNORMAL HIGH (ref 0.1–0.5)
Indirect Bilirubin: 2.2 mg/dL — ABNORMAL HIGH (ref 0.3–0.9)
TOTAL PROTEIN: 7.2 g/dL (ref 6.5–8.1)

## 2015-02-20 LAB — BASIC METABOLIC PANEL
ANION GAP: 8 (ref 5–15)
BUN: 9 mg/dL (ref 6–20)
CO2: 29 mmol/L (ref 22–32)
Calcium: 8.3 mg/dL — ABNORMAL LOW (ref 8.9–10.3)
Chloride: 95 mmol/L — ABNORMAL LOW (ref 101–111)
Creatinine, Ser: 1.09 mg/dL — ABNORMAL HIGH (ref 0.44–1.00)
GLUCOSE: 108 mg/dL — AB (ref 65–99)
POTASSIUM: 3.4 mmol/L — AB (ref 3.5–5.1)
Sodium: 132 mmol/L — ABNORMAL LOW (ref 135–145)

## 2015-02-20 LAB — MAGNESIUM: Magnesium: 1.6 mg/dL — ABNORMAL LOW (ref 1.7–2.4)

## 2015-02-20 MED ORDER — LEVOFLOXACIN 750 MG PO TABS
750.0000 mg | ORAL_TABLET | Freq: Every day | ORAL | Status: DC
  Administered 2015-02-20 – 2015-02-21 (×2): 750 mg via ORAL
  Filled 2015-02-20 (×2): qty 1

## 2015-02-20 MED ORDER — ALBUMIN HUMAN 25 % IV SOLN
25.0000 g | Freq: Two times a day (BID) | INTRAVENOUS | Status: AC
  Administered 2015-02-20 – 2015-02-21 (×2): 25 g via INTRAVENOUS
  Filled 2015-02-20 (×2): qty 100

## 2015-02-20 MED ORDER — ALBUMIN HUMAN 25 % IV SOLN
INTRAVENOUS | Status: AC
  Filled 2015-02-20: qty 100

## 2015-02-20 MED ORDER — MAGNESIUM SULFATE 2 GM/50ML IV SOLN
2.0000 g | Freq: Once | INTRAVENOUS | Status: AC
  Administered 2015-02-20: 2 g via INTRAVENOUS
  Filled 2015-02-20: qty 50

## 2015-02-20 MED ORDER — AMOXICILLIN-POT CLAVULANATE 875-125 MG PO TABS: 1.0000 | ORAL_TABLET | Freq: Two times a day (BID) | ORAL | Status: DC

## 2015-02-20 NOTE — Progress Notes (Signed)
[facility] PROGRESS NOTE  Whitney Bush [id number] DOB: 1970-11-13 DOA: 02/16/2015 PCP: [place]  Assessment/Plan: 1. HCAP. Positive for Strep Pneumo urinary antigen. Continue bronchodilators and Mucinex. Given she remains stable and afebrile, will transition to oral abx today.  2. Anasarca and fluid overload, hx of hepatic cirrhosis. Improving with IV Lasix and Aldactone. Will continue.and monitor I&Os. 3. Cirrhosis with hypoalbuminemia, hyponatremia and coagulopathy. INR 1.87. Will recheck Albumin level.   4. Hypomagnesemia, will replete 5. Trichomoniasis, given single dose of Flagyl. Negative HIV.  6. Alcohol abuse, counseled on the importance of cessation.  7. Hypokalemia, replete.  Code Status: Full DVT prophylaxis: SCDs Family Communication: No family at bedside. Disposition Plan: Anticipate discharge in the next 1-2 days.    Consultants:    Procedures:    Antibiotics:  Vancomycin 12/20>>12/24  Cefepime 12/20>>12/24  Levofloxacin 12/24>>  HPI/Subjective: LE swelling is about the same. Breathing is improved and abdomen feels less swollen.   Objective: Filed Vitals:   02/19/15 2156 02/20/15 0550  BP: 113/55 119/72  Pulse: 112 114  Temp: 98.6 F (37 C) 98.9 F (37.2 C)  Resp: 20     Intake/Output Summary (Last 24 hours) at 02/20/15 0816 Last data filed at 02/19/15 2334  Gross per 24 hour  Intake   1584 ml  Output   2950 ml  Net  -1366 ml   Filed Weights   02/18/15 0615 02/19/15 0500 02/20/15 0529  Weight: 123.016 kg (271 lb 3.2 oz) 120.249 kg (265 lb 1.6 oz) 118.298 kg (260 lb 12.8 oz)    Exam:  General: NAD, looks comfortable Cardiovascular: RRR, S1, S2  Respiratory: Diminished breath sounds at the right base. No wheezing Abdomen: soft, non tender, no distention , bowel sounds normal Musculoskeletal: 2+ edema b/l  Data Reviewed: Basic Metabolic Panel:  Recent Labs Lab 02/16/15 1555 02/17/15 0728  02/18/15 0836 02/19/15 0630 02/20/15 0641  NA 139 137 133* 134* 132*  K 3.5 3.1* 3.4* 3.4* 3.4*  CL 109 101 97* 96* 95*  CO2 24 28 29 31 29   GLUCOSE 103* 77 114* 88 108*  BUN 9 8 8 8 9   CREATININE 1.02* 1.09* 1.06* 1.00 1.09*  CALCIUM 8.1* 8.6* 8.3* 8.3* 8.3*  MG  --   --   --   --  1.6*   Liver Function Tests:  Recent Labs Lab 02/16/15 1555 02/17/15 0728  AST 45* 48*  ALT 18 20  ALKPHOS 92 85  BILITOT 3.9* 5.7*  PROT 7.7 8.3*  ALBUMIN 2.1* 2.3*    Recent Labs Lab 02/16/15 1555  LIPASE 53*    Recent Labs Lab 02/16/15 1614  AMMONIA 53*   CBC:  Recent Labs Lab 02/16/15 1555 02/17/15 0728  WBC 9.8 11.6*  NEUTROABS 5.5  --   HGB 8.0* 8.5*  HCT 24.6* 25.6*  MCV 92.8 93.1  PLT 174 192   Cardiac Enzymes:  Recent Labs Lab 02/16/15 1555  TROPONINI <0.03   BNP (last 3 results)  Recent Labs  07/08/14 0912 02/16/15 1555  BNP 22.0 242.0*     Recent Results (from the past 240 hour(s))  Blood culture (routine x 2)     Status: None (Preliminary result)   Collection Time: 02/16/15  5:15 PM  Result Value Ref Range Status   Specimen Description BLOOD LEFT ARM  Final   Special Requests BOTTLES DRAWN AEROBIC AND ANAEROBIC 4CC EACH  Final   Culture NO GROWTH 4 DAYS  Final   Report  Status PENDING  Incomplete  Blood culture (routine x 2)     Status: None (Preliminary result)   Collection Time: 02/16/15  5:20 PM  Result Value Ref Range Status   Specimen Description BLOOD LEFT HAND  Final   Special Requests BOTTLES DRAWN AEROBIC ONLY 4CC  Final   Culture NO GROWTH 4 DAYS  Final   Report Status PENDING  Incomplete  Culture, sputum-assessment     Status: None   Collection Time: 02/19/15  8:38 AM  Result Value Ref Range Status   Specimen Description SPU  Final   Special Requests NONE  Final   Sputum evaluation THIS SPECIMEN IS ACCEPTABLE FOR SPUTUM CULTURE  Final   Report Status 02/19/2015 FINAL  Final  Culture, respiratory (NON-Expectorated)     Status:  None (Preliminary result)   Collection Time: 02/19/15  8:38 AM  Result Value Ref Range Status   Specimen Description SPU  Final   Special Requests NONE  Final   Gram Stain   Final    NO WBC SEEN FEW SQUAMOUS EPITHELIAL CELLS PRESENT FEW GRAM POSITIVE RODS RARE GRAM NEGATIVE RODS RARE GRAM POSITIVE COCCI    Culture PENDING  Incomplete   Report Status PENDING  Incomplete      Scheduled Meds: . ceFEPime (MAXIPIME) IV  1 g Intravenous 3 times per day  . folic acid  1 mg Oral Daily  . furosemide  40 mg Intravenous BID  . guaiFENesin  1,200 mg Oral BID  . heparin  5,000 Units Subcutaneous 3 times per day  . lactulose  30 g Oral Daily  . multivitamin with minerals  1 tablet Oral Daily  . pantoprazole  40 mg Oral QAC breakfast  . spironolactone  100 mg Oral Daily  . thiamine  100 mg Oral Daily  . vancomycin  1,000 mg Intravenous Q12H   Continuous Infusions:   Principal Problem:   HCAP (healthcare-associated pneumonia) Active Problems:   Essential hypertension   Alcohol abuse   Cirrhosis ([place])   Anasarca   Dyspnea   Hypokalemia    Time spent: 25 minutes   Demico Ploch. MD  [facility] Pager [phone]. If 7PM-7AM, please contact night-coverage at [email or web address], password [facility] 02/20/2015, 8:16 AM  LOS: 4 days      By signing my name below, I, Rosalie Doctor, attest that this documentation has been prepared under the direction and in the presence of [employer]. MD Electronically Signed: Rosalie Doctor, Scribe. 02/20/2015 8:48am  I, Dr. Kathie Dike, personally performed the services described in this documentaiton. All medical record entries made by the scribe were at my direction and in my presence. I have reviewed the chart and agree that the record reflects my personal performance and is accurate and complete  Kathie Dike, MD, 02/20/2015 8:58 AM

## 2015-02-21 LAB — BASIC METABOLIC PANEL
Anion gap: 8 (ref 5–15)
BUN: 8 mg/dL (ref 6–20)
CO2: 32 mmol/L (ref 22–32)
CREATININE: 1.08 mg/dL — AB (ref 0.44–1.00)
Calcium: 8.6 mg/dL — ABNORMAL LOW (ref 8.9–10.3)
Chloride: 94 mmol/L — ABNORMAL LOW (ref 101–111)
GFR calc Af Amer: >60 mL/min (ref >60)
Glucose, Bld: 106 mg/dL — ABNORMAL HIGH (ref 65–99)
Potassium: 3.3 mmol/L — ABNORMAL LOW (ref 3.5–5.1)
SODIUM: 134 mmol/L — AB (ref 135–145)

## 2015-02-21 LAB — CULTURE, BLOOD (ROUTINE X 2)
Culture: NO GROWTH
Culture: NO GROWTH

## 2015-02-21 LAB — CULTURE, RESPIRATORY W GRAM STAIN: Culture: NORMAL

## 2015-02-21 LAB — CULTURE, RESPIRATORY: GRAM STAIN: NONE SEEN

## 2015-02-21 LAB — MAGNESIUM: MAGNESIUM: 1.6 mg/dL — AB (ref 1.7–2.4)

## 2015-02-21 MED ORDER — MAGNESIUM SULFATE 4 GM/100ML IV SOLN
4.0000 g | Freq: Once | INTRAVENOUS | Status: AC
  Administered 2015-02-21: 4 g via INTRAVENOUS
  Filled 2015-02-21: qty 100

## 2015-02-21 MED ORDER — POTASSIUM CHLORIDE CRYS ER 20 MEQ PO TBCR: 40.0000 meq | EXTENDED_RELEASE_TABLET | Freq: Every day | ORAL | Status: DC

## 2015-02-21 MED ORDER — ALBUMIN HUMAN 25 % IV SOLN
25.0000 g | Freq: Two times a day (BID) | INTRAVENOUS | Status: AC
  Administered 2015-02-21 – 2015-02-22 (×4): 25 g via INTRAVENOUS
  Filled 2015-02-21 (×5): qty 100

## 2015-02-21 MED ORDER — POTASSIUM CHLORIDE 10 MEQ/100ML IV SOLN
10.0000 meq | INTRAVENOUS | Status: AC
  Administered 2015-02-21 (×4): 10 meq via INTRAVENOUS
  Filled 2015-02-21 (×3): qty 100

## 2015-02-21 NOTE — Progress Notes (Signed)
[facility] PROGRESS NOTE  Whitney Bush [id number] DOB: [birth year] DOA: 02/16/2015 PCP: [place]  Assessment/Plan: 1. HCAP, improved. Positive for Strep Pneumo urinary antigen. Continue bronchodilators and Mucinex. Transitioned to oral abx 12/24.  2. Anasarca and fluid overload, hx of hepatic cirrhosis. Continues to improve with IV Lasix and Aldactone. Will continue.and monitor I&Os. Weight has decreased by 26 pounds since admission. She continues to have good urine output and renal function appears to be tolerating intravenous diuresis. Will continue with current treatments.  3. Cirrhosis with hypoalbuminemia, hyponatremia and coagulopathy. INR 1.87.    4. Hypomagnesemia, replace 5. Trichomoniasis, treated with single dose of Flagyl. Negative HIV.  6. Alcohol abuse, counseled on the importance of cessation.  7. Hypokalemia, replete.  Code Status: Full DVT prophylaxis: SCDs Family Communication: Discussed with patient who understands and has no concerns at this time. Disposition Plan: Anticipate discharge in the next 24 hours.     Consultants:    Procedures:    Antibiotics:  Vancomycin 12/20>>12/24  Cefepime 12/20>>12/24  Levofloxacin 12/24>>  HPI/Subjective: Breathing is doing fine. Reports good urine output with no difficulty.   Objective: Filed Vitals:   02/20/15 2144 02/21/15 0539  BP: 106/62 115/54  Pulse: 116 123  Temp: 98.3 F (36.8 C) 98.8 F (37.1 C)  Resp: 20 20    Intake/Output Summary (Last 24 hours) at 02/21/15 0842 Last data filed at 02/21/15 0541  Gross per 24 hour  Intake   1010 ml  Output   3100 ml  Net  -2090 ml   Filed Weights   02/19/15 0500 02/20/15 0529 02/21/15 0539  Weight: 120.249 kg (265 lb 1.6 oz) 118.298 kg (260 lb 12.8 oz) 115.395 kg (254 lb 6.4 oz)    Exam: General: NAD. Appears calm and looks comfortable Cardiovascular: RRR, S1, S2  Respiratory: clear bilaterally. No wheezing Abdomen:  soft, non tender, no distention , bowel sounds normal Musculoskeletal: 1+ edema b/l, patient has abdominal wall edema  Data Reviewed: Basic Metabolic Panel:  Recent Labs Lab 02/16/15 1555 02/17/15 0728 02/18/15 0836 02/19/15 0630 02/20/15 0641  NA 139 137 133* 134* 132*  K 3.5 3.1* 3.4* 3.4* 3.4*  CL 109 101 97* 96* 95*  CO2 24 28 29 31 29   GLUCOSE 103* 77 114* 88 108*  BUN 9 8 8 8 9   CREATININE 1.02* 1.09* 1.06* 1.00 1.09*  CALCIUM 8.1* 8.6* 8.3* 8.3* 8.3*  MG  --   --   --   --  1.6*   Liver Function Tests:  Recent Labs Lab 02/16/15 1555 02/17/15 0728 02/20/15 0641  AST 45* 48* 44*  ALT 18 20 18   ALKPHOS 92 85 78  BILITOT 3.9* 5.7* 4.0*  PROT 7.7 8.3* 7.2  ALBUMIN 2.1* 2.3* 2.0*    Recent Labs Lab 02/16/15 1555  LIPASE 53*    Recent Labs Lab 02/16/15 1614  AMMONIA 53*   CBC:  Recent Labs Lab 02/16/15 1555 02/17/15 0728  WBC 9.8 11.6*  NEUTROABS 5.5  --   HGB 8.0* 8.5*  HCT 24.6* 25.6*  MCV 92.8 93.1  PLT 174 192   Cardiac Enzymes:  Recent Labs Lab 02/16/15 1555  TROPONINI <0.03   BNP (last 3 results)  Recent Labs  07/08/14 0912 02/16/15 1555  BNP 22.0 242.0*     Recent Results (from the past 240 hour(s))  Blood culture (routine x 2)     Status: None   Collection Time: 02/16/15  5:15 PM  Result Value  Ref Range Status   Specimen Description BLOOD LEFT ARM  Final   Special Requests BOTTLES DRAWN AEROBIC AND ANAEROBIC 4CC EACH  Final   Culture NO GROWTH 5 DAYS  Final   Report Status 02/21/2015 FINAL  Final  Blood culture (routine x 2)     Status: None   Collection Time: 02/16/15  5:20 PM  Result Value Ref Range Status   Specimen Description BLOOD LEFT HAND  Final   Special Requests BOTTLES DRAWN AEROBIC ONLY 4CC  Final   Culture NO GROWTH 5 DAYS  Final   Report Status 02/21/2015 FINAL  Final  Culture, sputum-assessment     Status: None   Collection Time: 02/19/15  8:38 AM  Result Value Ref Range Status   Specimen  Description SPU  Final   Special Requests NONE  Final   Sputum evaluation THIS SPECIMEN IS ACCEPTABLE FOR SPUTUM CULTURE  Final   Report Status 02/19/2015 FINAL  Final  Culture, respiratory (NON-Expectorated)     Status: None (Preliminary result)   Collection Time: 02/19/15  8:38 AM  Result Value Ref Range Status   Specimen Description SPU  Final   Special Requests NONE  Final   Gram Stain   Final    NO WBC SEEN FEW SQUAMOUS EPITHELIAL CELLS PRESENT FEW GRAM POSITIVE RODS RARE GRAM NEGATIVE RODS RARE GRAM POSITIVE COCCI    Culture   Final    NORMAL OROPHARYNGEAL FLORA Performed at [facility]    Report Status PENDING  Incomplete      Scheduled Meds: . albumin human  25 g Intravenous BID  . folic acid  1 mg Oral Daily  . furosemide  40 mg Intravenous BID  . guaiFENesin  1,200 mg Oral BID  . heparin  5,000 Units Subcutaneous 3 times per day  . lactulose  30 g Oral Daily  . levofloxacin  750 mg Oral Daily  . multivitamin with minerals  1 tablet Oral Daily  . pantoprazole  40 mg Oral QAC breakfast  . spironolactone  100 mg Oral Daily  . thiamine  100 mg Oral Daily   Continuous Infusions:   Principal Problem:   HCAP (healthcare-associated pneumonia) Active Problems:   Essential hypertension   Alcohol abuse   Cirrhosis ([place])   Anasarca   Dyspnea   Hypokalemia    Time spent: 25 minutes   Michale Weikel. MD  [facility] Pager [phone]. If 7PM-7AM, please contact night-coverage at [email or web address], password [facility] 02/21/2015, 8:42 AM  LOS: 5 days

## 2015-02-22 LAB — CBC
HEMATOCRIT: 19.6 % — AB (ref 36.0–46.0)
HEMOGLOBIN: 6.5 g/dL — AB (ref 12.0–15.0)
MCH: 30.5 pg (ref 26.0–34.0)
MCHC: 33.2 g/dL (ref 30.0–36.0)
MCV: 92 fL (ref 78.0–100.0)
Platelets: 97 10*3/uL — ABNORMAL LOW (ref 150–400)
RBC: 2.13 MIL/uL — AB (ref 3.87–5.11)
RDW: 16.2 % — ABNORMAL HIGH (ref 11.5–15.5)
WBC: 8.8 10*3/uL (ref 4.0–10.5)

## 2015-02-22 LAB — COMPREHENSIVE METABOLIC PANEL
ALT: 16 U/L (ref 14–54)
AST: 36 U/L (ref 15–41)
Albumin: 2.6 g/dL — ABNORMAL LOW (ref 3.5–5.0)
Alkaline Phosphatase: 59 U/L (ref 38–126)
Anion gap: 5 (ref 5–15)
BUN: 9 mg/dL (ref 6–20)
CHLORIDE: 96 mmol/L — AB (ref 101–111)
CO2: 33 mmol/L — ABNORMAL HIGH (ref 22–32)
Calcium: 8.5 mg/dL — ABNORMAL LOW (ref 8.9–10.3)
Creatinine, Ser: 1.12 mg/dL — ABNORMAL HIGH (ref 0.44–1.00)
GFR, EST NON AFRICAN AMERICAN: 60 mL/min — AB (ref >60)
Glucose, Bld: 126 mg/dL — ABNORMAL HIGH (ref 65–99)
POTASSIUM: 3.5 mmol/L (ref 3.5–5.1)
Sodium: 134 mmol/L — ABNORMAL LOW (ref 135–145)
Total Bilirubin: 3.8 mg/dL — ABNORMAL HIGH (ref 0.3–1.2)
Total Protein: 7 g/dL (ref 6.5–8.1)

## 2015-02-22 LAB — PREPARE RBC (CROSSMATCH)

## 2015-02-22 MED ORDER — SODIUM CHLORIDE 0.9 % IV SOLN
Freq: Once | INTRAVENOUS | Status: AC
  Administered 2015-02-22: 14:00:00 via INTRAVENOUS

## 2015-02-22 NOTE — Progress Notes (Signed)
Dr Roderic Palau Paged concerning Critical HGB 6.5.  Paged at [place]. Waiting for response.

## 2015-02-22 NOTE — Progress Notes (Signed)
CRITICAL VALUE ALERT  Critical value received:  HGB 6.5  Date of notification:  02/22/15  Time of notification:  0730  Critical value read back:yes  Nurse who received alert:  Kendra Opitz RN  MD notified (1st page):  Dr Roderic Palau  Time of first page:  0731  MD notified (2nd page):N/A  Time of second page:N/A  Responding MD: N/A  Time MD responded:  N/A

## 2015-02-22 NOTE — Progress Notes (Signed)
[facility] PROGRESS NOTE  Whitney Bush [id number] DOB: 1970/12/23 DOA: 02/16/2015 PCP: [place]  Assessment/Plan: 1. HCAP, improved. Positive for Strep Pneumo urinary antigen. Continue bronchodilators and Mucinex. Transitioned to oral abx 12/24. She has completed 7 days of abx, will discontinue abx today.  2. Anasarca and fluid overload, hx of hepatic cirrhosis. Continues to improve with IV Lasix and Aldactone. Will continue to monitor I&Os. Weight has decreased by 29 pounds since admission. She continues to have good urine output and renal function appears to be tolerating intravenous diuresis. Will continue with current treatments.  3. Anemia, etiology unclear. Hgb noted to be 6.5 this morning, will transfuse 2 units PRBC. No obvious signs of bleeding. Will check stool occult blood although based on previous anemia panel, suspect this is related to chronic disease. 4. Cirrhosis with hypoalbuminemia, hyponatremia and coagulopathy. INR 1.87.    5. Hypomagnesemia, resolved.  6. Trichomoniasis, treated with single dose of Flagyl. Negative HIV.  7. Alcohol abuse, counseled on the importance of cessation.  8. Hypokalemia, resolved.  Code Status: Full DVT prophylaxis: SCDs Family Communication: Discussed with patient who understands and has no concerns at this time. Disposition Plan: Anticipate discharge in the next 1-2 days.      Consultants:    Procedures:    Antibiotics:  Vancomycin 12/20>>12/24  Cefepime 12/20>>12/24  Levofloxacin 12/24>>12/26  HPI/Subjective: Doing ok this morning. Denies any recent noticeable bleeding. LNMP was one week ago.   Objective: Filed Vitals:   02/21/15 2320 02/22/15 0544  BP: 106/53 117/68  Pulse: 106 104  Temp: 98.2 F (36.8 C) 98.6 F (37 C)  Resp: 20 20    Intake/Output Summary (Last 24 hours) at 02/22/15 0747 Last data filed at 02/22/15 0548  Gross per 24 hour  Intake    480 ml  Output   1850 ml   Net  -1370 ml   Filed Weights   02/20/15 0529 02/21/15 0539 02/22/15 0544  Weight: 118.298 kg (260 lb 12.8 oz) 115.395 kg (254 lb 6.4 oz) 114.216 kg (251 lb 12.8 oz)    Exam: General: NAD. Appears calm and looks comfortable Cardiovascular: Tachycardiac  Respiratory: CTAB, No wheezing, rales or rhonchi Abdomen: soft, non tender, no distention, bowel sounds normal Musculoskeletal: 1+ edema b/l anasarca   Data Reviewed: Basic Metabolic Panel:  Recent Labs Lab 02/18/15 0836 02/19/15 0630 02/20/15 0641 02/21/15 1123 02/22/15 0636  NA 133* 134* 132* 134* 134*  K 3.4* 3.4* 3.4* 3.3* 3.5  CL 97* 96* 95* 94* 96*  CO2 29 31 29  32 33*  GLUCOSE 114* 88 108* 106* 126*  BUN 8 8 9 8 9   CREATININE 1.06* 1.00 1.09* 1.08* 1.12*  CALCIUM 8.3* 8.3* 8.3* 8.6* 8.5*  MG  --   --  1.6* 1.6*  --    Liver Function Tests:  Recent Labs Lab 02/16/15 1555 02/17/15 0728 02/20/15 0641 02/22/15 0636  AST 45* 48* 44* 36  ALT 18 20 18 16   ALKPHOS 92 85 78 59  BILITOT 3.9* 5.7* 4.0* 3.8*  PROT 7.7 8.3* 7.2 7.0  ALBUMIN 2.1* 2.3* 2.0* 2.6*    Recent Labs Lab 02/16/15 1555  LIPASE 53*    Recent Labs Lab 02/16/15 1614  AMMONIA 53*   CBC:  Recent Labs Lab 02/16/15 1555 02/17/15 0728 02/22/15 0636  WBC 9.8 11.6* 8.8  NEUTROABS 5.5  --   --   HGB 8.0* 8.5* 6.5*  HCT 24.6* 25.6* 19.6*  MCV 92.8 93.1 92.0  PLT 174 192 97*   Cardiac Enzymes:  Recent Labs Lab 02/16/15 1555  TROPONINI <0.03   BNP (last 3 results)  Recent Labs  07/08/14 0912 02/16/15 1555  BNP 22.0 242.0*     Recent Results (from the past 240 hour(s))  Blood culture (routine x 2)     Status: None   Collection Time: 02/16/15  5:15 PM  Result Value Ref Range Status   Specimen Description BLOOD LEFT ARM  Final   Special Requests BOTTLES DRAWN AEROBIC AND ANAEROBIC 4CC EACH  Final   Culture NO GROWTH 5 DAYS  Final   Report Status 02/21/2015 FINAL  Final  Blood culture (routine x 2)     Status: None    Collection Time: 02/16/15  5:20 PM  Result Value Ref Range Status   Specimen Description BLOOD LEFT HAND  Final   Special Requests BOTTLES DRAWN AEROBIC ONLY 4CC  Final   Culture NO GROWTH 5 DAYS  Final   Report Status 02/21/2015 FINAL  Final  Culture, sputum-assessment     Status: None   Collection Time: 02/19/15  8:38 AM  Result Value Ref Range Status   Specimen Description SPU  Final   Special Requests NONE  Final   Sputum evaluation THIS SPECIMEN IS ACCEPTABLE FOR SPUTUM CULTURE  Final   Report Status 02/19/2015 FINAL  Final  Culture, respiratory (NON-Expectorated)     Status: None   Collection Time: 02/19/15  8:38 AM  Result Value Ref Range Status   Specimen Description SPU  Final   Special Requests NONE  Final   Gram Stain   Final    NO WBC SEEN FEW SQUAMOUS EPITHELIAL CELLS PRESENT FEW GRAM POSITIVE RODS RARE GRAM NEGATIVE RODS RARE GRAM POSITIVE COCCI    Culture   Final    NORMAL OROPHARYNGEAL FLORA Performed at [facility]    Report Status 02/21/2015 FINAL  Final      Scheduled Meds: . albumin human  25 g Intravenous BID  . folic acid  1 mg Oral Daily  . furosemide  40 mg Intravenous BID  . guaiFENesin  1,200 mg Oral BID  . heparin  5,000 Units Subcutaneous 3 times per day  . lactulose  30 g Oral Daily  . levofloxacin  750 mg Oral Daily  . multivitamin with minerals  1 tablet Oral Daily  . pantoprazole  40 mg Oral QAC breakfast  . spironolactone  100 mg Oral Daily  . thiamine  100 mg Oral Daily   Continuous Infusions:   Principal Problem:   HCAP (healthcare-associated pneumonia) Active Problems:   Essential hypertension   Alcohol abuse   Cirrhosis ([place])   Anasarca   Dyspnea   Hypokalemia    Time spent: 25 minutes   Jehanzeb Memon. MD  [facility] Pager [phone]. If 7PM-7AM, please contact night-coverage at [email or web address], password [facility] 02/22/2015, 7:47 AM  LOS: 6 days      By signing my name below, I, Rennis Harding,  attest that this documentation has been prepared under the direction and in the presence of Kathie Dike, MD. Electronically signed: Rennis Harding, Scribe. 02/22/2015 9:12am   I, Dr. Kathie Dike, personally performed the services described in this documentaiton. All medical record entries made by the scribe were at my direction and in my presence. I have reviewed the chart and agree that the record reflects my personal performance and is accurate and complete  Kathie Dike, MD, 02/22/2015 9:29 AM

## 2015-02-23 ENCOUNTER — Encounter (HOSPITAL_COMMUNITY): Payer: Self-pay | Admitting: Gastroenterology

## 2015-02-23 DIAGNOSIS — J189 Pneumonia, unspecified organism: Secondary | ICD-10-CM

## 2015-02-23 DIAGNOSIS — R195 Other fecal abnormalities: Secondary | ICD-10-CM

## 2015-02-23 DIAGNOSIS — K7031 Alcoholic cirrhosis of liver with ascites: Principal | ICD-10-CM

## 2015-02-23 DIAGNOSIS — D649 Anemia, unspecified: Secondary | ICD-10-CM

## 2015-02-23 LAB — CBC
HCT: 23.8 % — ABNORMAL LOW (ref 36.0–46.0)
HEMOGLOBIN: 8.2 g/dL — AB (ref 12.0–15.0)
MCH: 30.6 pg (ref 26.0–34.0)
MCHC: 34.5 g/dL (ref 30.0–36.0)
MCV: 88.8 fL (ref 78.0–100.0)
Platelets: 111 10*3/uL — ABNORMAL LOW (ref 150–400)
RBC: 2.68 MIL/uL — AB (ref 3.87–5.11)
RDW: 17.1 % — ABNORMAL HIGH (ref 11.5–15.5)
WBC: 8.6 10*3/uL (ref 4.0–10.5)

## 2015-02-23 LAB — HEPATIC FUNCTION PANEL
ALBUMIN: 2.7 g/dL — AB (ref 3.5–5.0)
ALK PHOS: 56 U/L (ref 38–126)
ALT: 15 U/L (ref 14–54)
AST: 40 U/L (ref 15–41)
Bilirubin, Direct: 1.8 mg/dL — ABNORMAL HIGH (ref 0.1–0.5)
Indirect Bilirubin: 2.3 mg/dL — ABNORMAL HIGH (ref 0.3–0.9)
TOTAL PROTEIN: 7 g/dL (ref 6.5–8.1)
Total Bilirubin: 4.1 mg/dL — ABNORMAL HIGH (ref 0.3–1.2)

## 2015-02-23 LAB — TYPE AND SCREEN
ABO/RH(D): A POS
ANTIBODY SCREEN: NEGATIVE
UNIT DIVISION: 0
Unit division: 0

## 2015-02-23 LAB — BASIC METABOLIC PANEL
ANION GAP: 7 (ref 5–15)
BUN: 10 mg/dL (ref 6–20)
CHLORIDE: 95 mmol/L — AB (ref 101–111)
CO2: 31 mmol/L (ref 22–32)
Calcium: 8.6 mg/dL — ABNORMAL LOW (ref 8.9–10.3)
Creatinine, Ser: 1.03 mg/dL — ABNORMAL HIGH (ref 0.44–1.00)
GFR calc non Af Amer: >60 mL/min (ref >60)
Glucose, Bld: 135 mg/dL — ABNORMAL HIGH (ref 65–99)
POTASSIUM: 3.3 mmol/L — AB (ref 3.5–5.1)
SODIUM: 133 mmol/L — AB (ref 135–145)

## 2015-02-23 LAB — IRON AND TIBC
Iron: 78 ug/dL (ref 28–170)
SATURATION RATIOS: 51 % — AB (ref 10.4–31.8)
TIBC: 153 ug/dL — AB (ref 250–450)
UIBC: 75 ug/dL

## 2015-02-23 LAB — PROTIME-INR
INR: 1.94 — ABNORMAL HIGH (ref 0.00–1.49)
Prothrombin Time: 22.1 seconds — ABNORMAL HIGH (ref 11.6–15.2)

## 2015-02-23 LAB — OCCULT BLOOD X 1 CARD TO LAB, STOOL: FECAL OCCULT BLD: POSITIVE — AB

## 2015-02-23 LAB — FERRITIN: FERRITIN: 50 ng/mL (ref 11–307)

## 2015-02-23 NOTE — Progress Notes (Signed)
[facility] PROGRESS NOTE  Whitney Bush [id number] DOB: [birth year] DOA: 02/16/2015 PCP: [place] Summary  44 y/o woman with h/o ETOH cirrhosis and hepatitis, presented with complaints of edema and SOB. CXR reveal right and middle love pna that has been treated with 7 day course of abx. She was grossly volume overloaded and had anasarca. She was treated with IV lasix and had significant diuresis. Her hospital course has been complicated by the development of anemia and Hgb of 6.5, requiring transfusion of 2u PRBCs. Stool occult blood has ben test positive and GI has been consulted.  Assessment/Plan: 1. HCAP, improved. Positive for Strep Pneumo urinary antigen. Continue bronchodilators and Mucinex. Transitioned to oral abx 12/24. She has completed 7 days of abx, discontinued abx 12/26.  2. Anasarca and fluid overload, hx of hepatic cirrhosis. Continues to improve with IV Lasix and Aldactone. Will continue to monitor I&Os. Weight has decreased by 29 pounds since admission. She continues to have good urine output and renal function appears to be tolerating intravenous diuresis. Will continue with current treatments.  3. Anemia, etiology unclear. Hgb at baseline s/p transfusion 2 units PRBC. Stool occult blood positive without any evidence of gross bleeding.  Patient reports she was suppose to have a colonoscopy last week but this was not done since she was admitted to hospital. Will consult GI for further evaluation. 4. Cirrhosis with hypoalbuminemia, hyponatremia and coagulopathy.    5. Hypomagnesemia, resolved.  6. Trichomoniasis, treated with single dose of Flagyl. Negative HIV.  7. Alcohol abuse, counseled on the importance of cessation.  8. Hypokalemia, resolved.  Code Status: Full DVT prophylaxis: SCDs Family Communication: Discussed with patient who understands and has no concerns at this time. Disposition Plan: Anticipate discharge in the next 1-2 days.       Consultants:    Procedures:  Transfused 2 units PRBC   Antibiotics:  Vancomycin 12/20>>12/24  Cefepime 12/20>>12/24  Levofloxacin 12/24>>12/26  HPI/Subjective: Doing fine. A little tired after transfusion. Urinating without issues.  Objective: Filed Vitals:   02/22/15 2301 02/23/15 0658  BP: 125/54 111/68  Pulse: 103 111  Temp: 98.9 F (37.2 C) 98.5 F (36.9 C)  Resp: 20 20    Intake/Output Summary (Last 24 hours) at 02/23/15 1044 Last data filed at 02/23/15 0900  Gross per 24 hour  Intake   1390 ml  Output    900 ml  Net    490 ml   Filed Weights   02/21/15 0539 02/22/15 0544 02/23/15 0658  Weight: 115.395 kg (254 lb 6.4 oz) 114.216 kg (251 lb 12.8 oz) 114.17 kg (251 lb 11.2 oz)    Exam: General: NAD. Appears calm and looks comfortable Cardiovascular: Tachycardiac  Respiratory: CTAB, No wheezing, rales or rhonchi Abdomen: soft, non tender, no distention, bowel sounds normal Musculoskeletal: 1+ edema b/l and generalized anasarca   Data Reviewed: Basic Metabolic Panel:  Recent Labs Lab 02/19/15 0630 02/20/15 0641 02/21/15 1123 02/22/15 0636 02/23/15 0826  NA 134* 132* 134* 134* 133*  K 3.4* 3.4* 3.3* 3.5 3.3*  CL 96* 95* 94* 96* 95*  CO2 31 29 32 33* 31  GLUCOSE 88 108* 106* 126* 135*  BUN 8 9 8 9 10   CREATININE 1.00 1.09* 1.08* 1.12* 1.03*  CALCIUM 8.3* 8.3* 8.6* 8.5* 8.6*  MG  --  1.6* 1.6*  --   --    Liver Function Tests:  Recent Labs Lab 02/16/15 1555 02/17/15 0728 02/20/15 0641 02/22/15 0636  AST 45* 48*  44* 36  ALT 18 20 18 16   ALKPHOS 92 85 78 59  BILITOT 3.9* 5.7* 4.0* 3.8*  PROT 7.7 8.3* 7.2 7.0  ALBUMIN 2.1* 2.3* 2.0* 2.6*    Recent Labs Lab 02/16/15 1555  LIPASE 53*    Recent Labs Lab 02/16/15 1614  AMMONIA 53*   CBC:  Recent Labs Lab 02/16/15 1555 02/17/15 0728 02/22/15 0636 02/23/15 0826  WBC 9.8 11.6* 8.8 8.6  NEUTROABS 5.5  --   --   --   HGB 8.0* 8.5* 6.5* 8.2*  HCT 24.6* 25.6* 19.6*  23.8*  MCV 92.8 93.1 92.0 88.8  PLT 174 192 97* 111*   Cardiac Enzymes:  Recent Labs Lab 02/16/15 1555  TROPONINI <0.03   BNP (last 3 results)  Recent Labs  07/08/14 0912 02/16/15 1555  BNP 22.0 242.0*     Recent Results (from the past 240 hour(s))  Blood culture (routine x 2)     Status: None   Collection Time: 02/16/15  5:15 PM  Result Value Ref Range Status   Specimen Description BLOOD LEFT ARM  Final   Special Requests BOTTLES DRAWN AEROBIC AND ANAEROBIC 4CC EACH  Final   Culture NO GROWTH 5 DAYS  Final   Report Status 02/21/2015 FINAL  Final  Blood culture (routine x 2)     Status: None   Collection Time: 02/16/15  5:20 PM  Result Value Ref Range Status   Specimen Description BLOOD LEFT HAND  Final   Special Requests BOTTLES DRAWN AEROBIC ONLY 4CC  Final   Culture NO GROWTH 5 DAYS  Final   Report Status 02/21/2015 FINAL  Final  Culture, sputum-assessment     Status: None   Collection Time: 02/19/15  8:38 AM  Result Value Ref Range Status   Specimen Description SPU  Final   Special Requests NONE  Final   Sputum evaluation THIS SPECIMEN IS ACCEPTABLE FOR SPUTUM CULTURE  Final   Report Status 02/19/2015 FINAL  Final  Culture, respiratory (NON-Expectorated)     Status: None   Collection Time: 02/19/15  8:38 AM  Result Value Ref Range Status   Specimen Description SPU  Final   Special Requests NONE  Final   Gram Stain   Final    NO WBC SEEN FEW SQUAMOUS EPITHELIAL CELLS PRESENT FEW GRAM POSITIVE RODS RARE GRAM NEGATIVE RODS RARE GRAM POSITIVE COCCI    Culture   Final    NORMAL OROPHARYNGEAL FLORA Performed at [facility]    Report Status 02/21/2015 FINAL  Final      Scheduled Meds: . folic acid  1 mg Oral Daily  . furosemide  40 mg Intravenous BID  . guaiFENesin  1,200 mg Oral BID  . lactulose  30 g Oral Daily  . multivitamin with minerals  1 tablet Oral Daily  . pantoprazole  40 mg Oral QAC breakfast  . spironolactone  100 mg Oral Daily   . thiamine  100 mg Oral Daily   Continuous Infusions:   Principal Problem:   HCAP (healthcare-associated pneumonia) Active Problems:   Essential hypertension   Alcohol abuse   Cirrhosis ([place])   Anasarca   Dyspnea   Hypokalemia    Time spent: 25 minutes   Damon Baisch. MD  [facility] Pager [phone]. If 7PM-7AM, please contact night-coverage at [email or web address], password [facility] 02/23/2015, 10:44 AM  LOS: 7 days      By signing my name below, I, Rennis Harding, attest that this documentation has been prepared under  the direction and in the presence of Kathie Dike, MD. Electronically signed: Rennis Harding, Scribe. 02/23/2015 10:49am  I, Dr. Kathie Dike, personally performed the services described in this documentaiton. All medical record entries made by the scribe were at my direction and in my presence. I have reviewed the chart and agree that the record reflects my personal performance and is accurate and complete  Kathie Dike, MD, 02/23/2015 10:56 AM

## 2015-02-23 NOTE — Consult Note (Signed)
Referring Provider: Dr. Roderic Palau  Primary Care Physician:  Alphia Kava Primary Gastroenterologist:  Dr. Gala Romney   Date of Admission:  02/16/15 Date of Consultation: 02/23/15  Reason for Consultation:  Anemia, heme positive stool   HPI:  Whitney Bush is a 44 y.o. year old female with a history of ETOH abuse, admitted for alcoholic hepatitis in Oct 2016 and evidence of cirrhosis on CT imaging during last admission. She was treated with prednisolone. She was scheduled to see our practice as an outpatient for cirrhosis care but did not show for this appointment. While inpatient during last admission, she was noted to be anemic without evidence of overt GI bleeding; she was heme positive at that time. Work-up for this was on hold due to acute kidney injury and decompensated liver disease. Appears her baseline Hgb was around 8. Dropped to 6.5 this admission with improvement back to baseline after 2 units PRBCs. She has had a prolonged hospitalization secondary to pneumonia, grossly volume overloaded and anasarca. She had not continued her diuretic therapy after discharge for unknown reasons.   Has historically taken NSAIDs about twice a day but quit 3 months ago. Believes she saw black stool about 5 months ago. No hematochezia or melena currently. No abdominal pain. Sides feel swollen. Abdomen feels tight. Extremity edema much improved. Sometimes will feel nauseated after eating. If eats pizza or spicy foods will have flares of indigestion. No dysphagia. No prior colonoscopy or upper endoscopy. States she drank a shot about 3 weeks ago. Has historically drank vodka daily. Sometimes confused. Will forget where she puts something. States family members have mentioned confusion before.   Past Medical History  Diagnosis Date  . Hypertension   . Depression   . Migraines   . Peripheral edema   . Alcohol abuse     Past Surgical History  Procedure Laterality Date  . Abcess drainage     . Cesarean section      Prior to Admission medications   Medication Sig Start Date End Date Taking? Authorizing Provider  B Complex Vitamins (VITAMIN B-COMPLEX PO) Take 1 tablet by mouth daily.   Yes Historical Provider, MD  lisinopril-hydrochlorothiazide (PRINZIDE,ZESTORETIC) 20-25 MG tablet Take 1 tablet by mouth daily.   Yes Historical Provider, MD  Multiple Vitamin (MULTIVITAMIN WITH MINERALS) TABS tablet Take 1 tablet by mouth daily. 12/05/14  Yes Erline Hau, MD  spironolactone (ALDACTONE) 50 MG tablet Take 1 tablet (50 mg total) by mouth daily. 12/21/14  Yes Erline Hau, MD  vitamin E 400 UNIT capsule Take 400 Units by mouth daily.   Yes Historical Provider, MD  folic acid (FOLVITE) 1 MG tablet Take 1 tablet (1 mg total) by mouth daily. Patient not taking: Reported on 02/16/2015 12/05/14   Erline Hau, MD  pantoprazole (PROTONIX) 40 MG tablet Take 1 tablet (40 mg total) by mouth daily before breakfast. Patient not taking: Reported on 02/16/2015 12/05/14   Erline Hau, MD  prednisoLONE (ORAPRED ODT) 10 MG disintegrating tablet Take 40 mg a day for 28 days. Then take 30 mg a day for 5 days, then 20 mg a day for 5 days, then 20 mg a day for 5 days. Then stop. Patient not taking: Reported on 02/16/2015 12/10/14   Carlis Stable, NP    Current Facility-Administered Medications  Medication Dose Route Frequency Provider Last Rate Last Dose  . folic acid (FOLVITE) tablet 1 mg  1 mg Oral Daily  Erline Hau, MD   1 mg at 02/23/15 1033  . furosemide (LASIX) injection 40 mg  40 mg Intravenous BID Orvan Falconer, MD   40 mg at 02/23/15 1028  . guaiFENesin (MUCINEX) 12 hr tablet 1,200 mg  1,200 mg Oral BID Verlee Monte, MD   1,200 mg at 02/23/15 1033  . guaiFENesin-dextromethorphan (ROBITUSSIN DM) 100-10 MG/5ML syrup 5 mL  5 mL Oral Q4H PRN Verlee Monte, MD      . lactulose (CHRONULAC) 10 GM/15ML solution 30 g  30 g Oral Daily Verlee Monte,  MD   30 g at 02/23/15 1034  . multivitamin with minerals tablet 1 tablet  1 tablet Oral Daily Erline Hau, MD   1 tablet at 02/23/15 1032  . ondansetron (ZOFRAN) tablet 4 mg  4 mg Oral Q6H PRN Erline Hau, MD       Or  . ondansetron [facility]) injection 4 mg  4 mg Intravenous Q6H PRN Erline Hau, MD      . oxyCODONE (Oxy IR/ROXICODONE) immediate release tablet 5 mg  5 mg Oral Q4H PRN Erline Hau, MD   5 mg at 02/23/15 0552  . pantoprazole (PROTONIX) EC tablet 40 mg  40 mg Oral QAC breakfast Erline Hau, MD   40 mg at 02/23/15 1032  . senna-docusate (Senokot-S) tablet 1 tablet  1 tablet Oral QHS PRN Erline Hau, MD      . spironolactone (ALDACTONE) tablet 100 mg  100 mg Oral Daily Verlee Monte, MD   100 mg at 02/23/15 1033  . thiamine (VITAMIN B-1) tablet 100 mg  100 mg Oral Daily Erline Hau, MD   100 mg at 02/23/15 1032    Allergies as of 02/16/2015  . (No Known Allergies)    Family History  Problem Relation Age of Onset  . Diabetes Father   . Cancer Other   . Cervical cancer Maternal Grandmother   . Lung cancer Maternal Grandfather   . Alcohol abuse Other     multiple family members  . Colon cancer Neg Hx   . Liver disease Neg Hx     Social History   Social History  . Marital Status: Single    Spouse Name: N/A  . Number of Children: 1  . Years of Education: N/A   Occupational History  . unemployed    Social History Main Topics  . Smoking status: Former Smoker -- 1.00 packs/day for 5 years    Types: Cigarettes    Quit date: 12/29/2010  . Smokeless tobacco: Former Systems developer    Types: Snuff  . Alcohol Use: 2.4 oz/week    4 Glasses of wine per week     Comment: occassional  . Drug Use: No  . Sexual Activity: Yes    Birth Control/ Protection: None   Other Topics Concern  . Not on file   Social History Narrative    Review of Systems: Gen: Denies fever, chills, loss of  appetite, change in weight or weight loss CV: Occasional chest discomfort, palpitations  Resp: +DOE  GI: see HPI  GU : Denies urinary burning, urinary frequency, urinary incontinence.  MS: +lower extremity edema, occasional cramping  Derm: Denies rash, itching, dry skin Psych: +depression  Heme: Denies bruising, bleeding, and enlarged lymph nodes.  Physical Exam: Vital signs in last 24 hours: Temp:  [98.5 F (36.9 C)-99.3 F (37.4 C)] 98.5 F (36.9 C) (12/27 0658) Pulse Rate:  [  103-111] 111 (12/27 0658) Resp:  [20] 20 (12/27 0658) BP: (108-128)/(48-73) 111/68 mmHg (12/27 0658) SpO2:  [98 %-100 %] 98 % (12/27 0658) Weight:  [251 lb 11.2 oz (114.17 kg)] 251 lb 11.2 oz (114.17 kg) (12/27 0658) Last BM Date: 02/22/15 General:   Alert,  Well-developed, well-nourished, pleasant and cooperative in NAD Head:  Normocephalic and atraumatic. Eyes:  Mild scleral icterus  Ears:  Normal auditory acuity. Nose:  No deformity, discharge,  or lesions. Mouth:  No deformity or lesions, dentition normal. Lungs:  Clear throughout to auscultation.   Heart:  Regular rate and rhythm Abdomen:  +BS, non-tender, obese, moderate ascites but non-tense Rectal:  Deferred until time of colonoscopy.   Msk:  Symmetrical without gross deformities. Normal posture. Extremities:  With 1+ lower extremity edema  Neurologic:  Alert and  oriented x4;  grossly normal neurologically. Skin:  Intact without significant lesions or rashes. Psych:  Alert and cooperative. Normal mood and affect.  Intake/Output from previous day: 12/26 0701 - 12/27 0700 In: 1150 [P.O.:480; Blood:670] Out: 900 [Urine:900] Intake/Output this shift: Total I/O In: 240 [P.O.:240] Out: -   Lab Results:  Recent Labs  02/22/15 0636 02/23/15 0826  WBC 8.8 8.6  HGB 6.5* 8.2*  HCT 19.6* 23.8*  PLT 97* 111*   BMET  Recent Labs  02/21/15 1123 02/22/15 0636 02/23/15 0826  NA 134* 134* 133*  K 3.3* 3.5 3.3*  CL 94* 96* 95*  CO2 32  33* 31  GLUCOSE 106* 126* 135*  BUN 8 9 10   CREATININE 1.08* 1.12* 1.03*  CALCIUM 8.6* 8.5* 8.6*   LFT  Recent Labs  02/22/15 0636  PROT 7.0  ALBUMIN 2.6*  AST 36  ALT 16  ALKPHOS 59  BILITOT 3.8*   Lab Results  Component Value Date   IRON 124 12/02/2014   TIBC 183* 12/02/2014   FERRITIN 84 12/02/2014     Impression: 44 year old admitted with cirrhosis, likely secondary to ETOH use, history of alcoholic hepatitis without any alcohol use for 3 weeks, admitted with pneumonia and anasarca. MELD 19.  She has improved significantly from a respiratory standpoint and diuresed quite well. Now with acute on chronic anemia requiring transfusion this admission but without overt GI bleeding. Heme positive stool noted. Previous iron studies in October with normal iron and ferritin. No prior colonoscopy/endoscopy. With new diagnosis of cirrhosis, would favor EGD first to evaluate for occult upper GI etiology and also serve as variceal screening assessment. Colonoscopy will need to be undertaken as an outpatient and strongly recommended as well. Further work-up of cirrhosis as outpatient. Discussed in detail with patient the risks of continuing ETOH in the setting of cirrhosis to include decompensation and death. She states she would like to stop "on my own". Declining assistance.   Elevated ammonia: now on lactulose. Some concern for mild encephalopathy prior to admission, as patient notes intermittent confusion and admits family has noted this as well. Currently at baseline and without any signs of encephalopathy.   As of note discriminant function between 36-52. Completed prednisolone that was prescribed in Oct 2016. Last ETOH intake several weeks ago. In setting of recent pneumonia would be hesitant to treat for possible ETOH hepatitis and doubt dealing with alcoholic hepatitis currently.   Recommendations: NPO after midnight EGD with Dr. Oneida Alar on 12/28 with Propofol due to ETOH  abuse Outpatient cirrhosis care Outpatient colonoscopy Continue lactulose dosing Continue Protonix once daily Diuresis per attending ETOH cessation discussed  Check HFP, INR, repeat iron  studies now   Orvil Feil, ANP-BC [facility]     LOS: 7 days    02/23/2015, 12:07 PM  GI attending note: Patient interviewed and examined. She has mildly tender hepatomegaly. She has been treated for HCAP has no respiratory symptoms. Transaminases are normal and serum albumin is coming up which is reassuring. 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 Patient will undergo diagnostic EGD under propofol by Dr. Oneida Alar tomorrow to evaluate anemia and heme positive stool.

## 2015-02-23 NOTE — Progress Notes (Signed)
Several nurses assisted with the adm of allbumin.  Pt requested that PICC dsg be changed with iodine, new covers to the actual sites, and to cut the old dsg off with sizzors.  This nurse contacted nurse adm and was instructed to clean area with alcohol and place new transparant dsg on site.  Pt would not allow this nurse to handle it  So this nurse did not comply but will tell next shift nurse to get someone more qualified to handle pt's request.

## 2015-02-23 NOTE — Care Management Note (Signed)
Case Management Note  Patient Details  Name: Whitney Bush MRN: [id number] Date of Birth: [birth year]   Expected Discharge Date:                  Expected Discharge Plan:  Home/Self Care  In-House Referral:  Financial Counselor  Discharge planning Services  CM Consult, [facility]  Post Acute Care Choice:  NA Choice offered to:  NA  DME Arranged:    DME Agency:     HH Arranged:    [place] Agency:     Status of Service:  Completed, signed off  Medicare Important Message Given:    Date Medicare IM Given:    Medicare IM give by:    Date Additional Medicare IM Given:    Additional Medicare Important Message give by:     If discussed at [place] of Stay Meetings, dates discussed:  02/23/2015  Additional Comments:  Sherald Barge, RN 02/23/2015, 3:51 PM

## 2015-02-24 ENCOUNTER — Encounter (HOSPITAL_COMMUNITY): Payer: Self-pay | Admitting: *Deleted

## 2015-02-24 ENCOUNTER — Inpatient Hospital Stay (HOSPITAL_COMMUNITY): Payer: Medicaid Other | Admitting: Anesthesiology

## 2015-02-24 ENCOUNTER — Encounter (HOSPITAL_COMMUNITY)
Admission: EM | Disposition: A | Discharge status: Home / Self Care | Payer: Self-pay | Source: Home / Self Care | Attending: Internal Medicine

## 2015-02-24 DIAGNOSIS — K2970 Gastritis, unspecified, without bleeding: Secondary | ICD-10-CM

## 2015-02-24 DIAGNOSIS — I8500 Esophageal varices without bleeding: Secondary | ICD-10-CM

## 2015-02-24 HISTORY — PX: BIOPSY: SHX5522

## 2015-02-24 HISTORY — PX: ESOPHAGOGASTRODUODENOSCOPY (EGD) WITH PROPOFOL: SHX5813

## 2015-02-24 LAB — CBC
HEMATOCRIT: 24.8 % — AB (ref 36.0–46.0)
HEMOGLOBIN: 8.4 g/dL — AB (ref 12.0–15.0)
MCH: 30.4 pg (ref 26.0–34.0)
MCHC: 33.9 g/dL (ref 30.0–36.0)
MCV: 89.9 fL (ref 78.0–100.0)
Platelets: 120 10*3/uL — ABNORMAL LOW (ref 150–400)
RBC: 2.76 MIL/uL — ABNORMAL LOW (ref 3.87–5.11)
RDW: 17 % — ABNORMAL HIGH (ref 11.5–15.5)
WBC: 10.6 10*3/uL — ABNORMAL HIGH (ref 4.0–10.5)

## 2015-02-24 LAB — BASIC METABOLIC PANEL
Anion gap: 7 (ref 5–15)
BUN: 10 mg/dL (ref 6–20)
CHLORIDE: 96 mmol/L — AB (ref 101–111)
CO2: 32 mmol/L (ref 22–32)
CREATININE: 0.95 mg/dL (ref 0.44–1.00)
Calcium: 8.5 mg/dL — ABNORMAL LOW (ref 8.9–10.3)
GFR calc Af Amer: >60 mL/min (ref >60)
GFR calc non Af Amer: >60 mL/min (ref >60)
GLUCOSE: 96 mg/dL (ref 65–99)
Potassium: 3.2 mmol/L — ABNORMAL LOW (ref 3.5–5.1)
SODIUM: 135 mmol/L (ref 135–145)

## 2015-02-24 LAB — PREGNANCY, URINE: Preg Test, Ur: NEGATIVE

## 2015-02-24 SURGERY — ESOPHAGOGASTRODUODENOSCOPY (EGD) WITH PROPOFOL
Anesthesia: Monitor Anesthesia Care

## 2015-02-24 MED ORDER — STERILE WATER FOR IRRIGATION IR SOLN
Status: DC | PRN
  Administered 2015-02-24: 14:00:00

## 2015-02-24 MED ORDER — SPIRONOLACTONE 25 MG PO TABS
100.0000 mg | ORAL_TABLET | Freq: Two times a day (BID) | ORAL | Status: DC
  Administered 2015-02-24 – 2015-02-25 (×2): 100 mg via ORAL
  Filled 2015-02-24 (×2): qty 4

## 2015-02-24 MED ORDER — GLYCOPYRROLATE 0.2 MG/ML IJ SOLN
INTRAMUSCULAR | Status: AC
  Filled 2015-02-24: qty 1

## 2015-02-24 MED ORDER — ONDANSETRON HCL 4 MG/2ML IJ SOLN
4.0000 mg | Freq: Once | INTRAMUSCULAR | Status: AC
  Administered 2015-02-24: 4 mg via INTRAVENOUS

## 2015-02-24 MED ORDER — ONDANSETRON HCL 4 MG/2ML IJ SOLN
INTRAMUSCULAR | Status: AC
  Filled 2015-02-24: qty 2

## 2015-02-24 MED ORDER — FENTANYL CITRATE (PF) 100 MCG/2ML IJ SOLN
25.0000 ug | INTRAMUSCULAR | Status: AC
  Administered 2015-02-24 (×2): 25 ug via INTRAVENOUS

## 2015-02-24 MED ORDER — FENTANYL CITRATE (PF) 100 MCG/2ML IJ SOLN
INTRAMUSCULAR | Status: AC
  Filled 2015-02-24: qty 2

## 2015-02-24 MED ORDER — ONDANSETRON HCL 4 MG/2ML IJ SOLN: 4.0000 mg | Freq: Once | INTRAMUSCULAR | Status: DC | PRN

## 2015-02-24 MED ORDER — PANTOPRAZOLE SODIUM 40 MG PO TBEC
40.0000 mg | DELAYED_RELEASE_TABLET | Freq: Two times a day (BID) | ORAL | Status: DC
  Administered 2015-02-24 – 2015-02-25 (×2): 40 mg via ORAL
  Filled 2015-02-24 (×2): qty 1

## 2015-02-24 MED ORDER — GLYCOPYRROLATE 0.2 MG/ML IJ SOLN
0.2000 mg | Freq: Once | INTRAMUSCULAR | Status: AC
  Administered 2015-02-24: 0.2 mg via INTRAVENOUS

## 2015-02-24 MED ORDER — LIDOCAINE VISCOUS 2 % MT SOLN
6.0000 mL | Freq: Once | OROMUCOSAL | Status: AC
  Administered 2015-02-24: 6 mL via OROMUCOSAL

## 2015-02-24 MED ORDER — FENTANYL CITRATE (PF) 100 MCG/2ML IJ SOLN: 25.0000 ug | INTRAMUSCULAR | Status: DC | PRN

## 2015-02-24 MED ORDER — LIDOCAINE VISCOUS 2 % MT SOLN
OROMUCOSAL | Status: AC
  Filled 2015-02-24: qty 15

## 2015-02-24 MED ORDER — PROPOFOL 10 MG/ML IV BOLUS
INTRAVENOUS | Status: AC
  Filled 2015-02-24: qty 20

## 2015-02-24 MED ORDER — MIDAZOLAM HCL 2 MG/2ML IJ SOLN
1.0000 mg | INTRAMUSCULAR | Status: DC | PRN
  Administered 2015-02-24 (×3): 2 mg via INTRAVENOUS
  Filled 2015-02-24 (×2): qty 2

## 2015-02-24 MED ORDER — PROPOFOL 500 MG/50ML IV EMUL
INTRAVENOUS | Status: DC | PRN
  Administered 2015-02-24: 125 ug/kg/min via INTRAVENOUS

## 2015-02-24 MED ORDER — MIDAZOLAM HCL 2 MG/2ML IJ SOLN
INTRAMUSCULAR | Status: AC
  Filled 2015-02-24: qty 2

## 2015-02-24 MED ORDER — LACTATED RINGERS IV SOLN
INTRAVENOUS | Status: DC
  Administered 2015-02-24: 13:00:00 via INTRAVENOUS

## 2015-02-24 MED ORDER — POTASSIUM CHLORIDE 10 MEQ/100ML IV SOLN
10.0000 meq | INTRAVENOUS | Status: DC
Start: 2015-02-24 — End: 2015-02-24
  Administered 2015-02-24: 10 meq via INTRAVENOUS
  Filled 2015-02-24: qty 100

## 2015-02-24 NOTE — Anesthesia Preprocedure Evaluation (Signed)
Anesthesia Evaluation  Patient identified by MRN, date of birth, ID band Patient awake    Reviewed: Allergy & Precautions, NPO status , Patient's Chart, lab work & pertinent test results  Airway Mallampati: III  TM Distance: >3 FB     Dental  (+) Teeth Intact   Pulmonary shortness of breath, pneumonia, resolved, former smoker,    breath sounds clear to auscultation       Cardiovascular hypertension, Pt. on medications  Rhythm:Regular Rate:Normal     Neuro/Psych  Headaches, PSYCHIATRIC DISORDERS Depression    GI/Hepatic (+) Cirrhosis   ascites  substance abuse  alcohol use, Hepatitis -  Endo/Other    Renal/GU Renal disease     Musculoskeletal   Abdominal   Peds  Hematology  (+) anemia ,   Anesthesia Other Findings   Reproductive/Obstetrics                             Anesthesia Physical Anesthesia Plan  ASA: III  Anesthesia Plan: MAC   Post-op Pain Management:    Induction: Intravenous  Airway Management Planned: Simple Face Mask  Additional Equipment:   Intra-op Plan:   Post-operative Plan:   Informed Consent: I have reviewed the patients History and Physical, chart, labs and discussed the procedure including the risks, benefits and alternatives for the proposed anesthesia with the patient or authorized representative who has indicated his/her understanding and acceptance.     Plan Discussed with:   Anesthesia Plan Comments:         Anesthesia Quick Evaluation

## 2015-02-24 NOTE — Progress Notes (Signed)
PROGRESS NOTE  Whitney Bush [id number] DOB: 05/16/1970 DOA: 02/16/2015 PCP: [facility]  Summary: 26 yom PMHx of ETOH cirrhosis and hepatitis, presented with complaints of edema and SOB. CXR reveal right and middle love pna that has been treated with 7 day course of abx. She was grossly volume overloaded and had anasarca. She was treated with IV lasix and had significant diuresis. Her hospital course has been complicated by the development of anemia and Hgb of 6.5, requiring transfusion of 2u PRBCs. Stool occult blood was positive and GI has been consulted.  Assessment/Plan: 1. Anasarca and fluid overload, hx of hepatic cirrhosis. Continues to improve with IV Lasix and Aldactone. Will continue to monitor I&Os. Weight has decreased by 33 pounds since admission. Excellent UOP. 2. Anemia, multifactorial: gastritis, gastropathy, coagulopathy, chronic disease. Hgb at baseline s/p transfusion 2 units PRBC. Stool occult blood positive without any evidence of gross bleeding.s/p EGD with multifactorial etiology of anemia. 3. Cirrhosis with hypoalbuminemia, hyponatremia,  esophageal varices, moderate portal hypertensive gastropathy and coagulopathy. Appears stable.  4. HCAP, resolved.   5. Hypomagnesemia, resolved.  6. Trichomoniasis, treated with single dose of Flagyl. Negative HIV.  7. Alcohol abuse, counseled on the importance of cessation.  8. Hypokalemia, resolved.   BID PPI  Continue Lasix, lactulose, Xifaxan, aldactone  Code Status: Full DVT prophylaxis: SCDs Family Communication: none Disposition Plan: home, likely 12/29  Murray Hodgkins, MD  [facility]  Pager [phone] If 7PM-7AM, please contact night-coverage at [email or web address], password [facility] 02/24/2015, 7:17 AM  LOS: 8 days   Consultants:  GI  Procedures:  Transfused 2 units PRBC  EGD ENDOSCOPIC IMPRESSION: GRADE II ESOPHAGEAL VARICES MODERATE PORTAL HYPERTENSIVE GASTROPATHY MILD  EROSIVE GASTRITIS ANEMIA LIKELY DUE TO MANY FACTORS: GASTRITIS, GASTROPATHY, COAGULOPATHY, CHRONIC DISEASE  Antibiotics:  Vancomycin 12/20>>12/24  Cefepime 12/20>>12/24  Levofloxacin 12/24>>12/26  HPI/Subjective: Feeling better, much less edema.  Objective: Filed Vitals:   02/23/15 1409 02/23/15 2045 02/23/15 2057 02/24/15 0448  BP: 101/49  99/48 126/69  Pulse: 104  106 110  Temp: 98.4 F (36.9 C)  98.7 F (37.1 C) 99.1 F (37.3 C)  TempSrc:   Oral Oral  Resp: 20  20 20   Height:      Weight:    112.084 kg (247 lb 1.6 oz)  SpO2: 100% 100% 100% 100%    Intake/Output Summary (Last 24 hours) at 02/24/15 0717 Last data filed at 02/24/15 0449  Gross per 24 hour  Intake    840 ml  Output   1900 ml  Net  -1060 ml     Filed Weights   02/22/15 0544 02/23/15 0658 02/24/15 0448  Weight: 114.216 kg (251 lb 12.8 oz) 114.17 kg (251 lb 11.2 oz) 112.084 kg (247 lb 1.6 oz)    Exam:    VSS, afebrile. General:  Appears comfortable, calm. Cardiovascular: Regular rate and rhythm, no murmur, rub or gallop. 1+ bilateral lower extremity edema. Respiratory: Clear to auscultation bilaterally, no wheezes, rales or rhonchi. Normal respiratory effort. Musculoskeletal: grossly normal tone bilateral upper and lower extremities Psychiatric: grossly normal mood and affect, speech fluent and appropriate  New data reviewed:  WBC 10.4, Hgb 8.4; both stable.  Plts 120, stable  BMP stable. K+ 3.2  Pertinent data since admission:  BNP 242  Lactic acid 2.6  CXR- Consolidation in the right middle and right lower lobes  +80lbs since discharge on 12/21/14.     Scheduled Meds: . folic acid  1 mg Oral Daily  . furosemide  40 mg Intravenous BID  . guaiFENesin  1,200 mg Oral BID  . lactulose  30 g Oral Daily  . multivitamin with minerals  1 tablet Oral Daily  . pantoprazole  40 mg Oral QAC breakfast  . spironolactone  100 mg Oral Daily  . thiamine  100 mg Oral Daily   Continuous  Infusions:   Principal Problem:   Anasarca Active Problems:   Essential hypertension   Alcohol abuse   Cirrhosis ([place])   HCAP (healthcare-associated pneumonia)   Dyspnea   Hypokalemia   Alcoholic cirrhosis of liver with ascites (HCC)   Heme positive stool   Time spent 20 minutes   By signing my name below, I, Rosalie Doctor attest that this documentation has been prepared under the direction and in the presence of Murray Hodgkins, MD Electronically signed: Rosalie Doctor, Scribe.  02/24/2015  I personally performed the services described in this documentation. All medical record entries made by the scribe were at my direction. I have reviewed the chart and agree that the record reflects my personal performance and is accurate and complete. Murray Hodgkins, MD

## 2015-02-24 NOTE — Op Note (Addendum)
[facility] [address] [place], [address]   ENDOSCOPY PROCEDURE REPORT  PATIENT: Whitney, Bush  MR#: [id number] BIRTHDATE: Jun 16, 1970 , 44  yrs. old GENDER: female  ENDOSCOPIST: Danie Binder, MD REFERRED Twanna Hy, M.D. PROCEDURE DATE: 2015-03-04 PROCEDURE:   EGD w/ biopsy  INDICATIONS:anemia.  MEDICATIONS: Monitored anesthesia care  TOPICAL ANESTHETIC:   Viscous Xylocaine ASA CLASS:  DESCRIPTION OF PROCEDURE:     Physical exam was performed.  Informed consent was obtained from the patient after explaining the benefits, risks, and alternatives to the procedure.  The patient was connected to the monitor and placed in the left lateral position.  Continuous oxygen was provided by nasal cannula and IV medicine administered through an indwelling cannula.  After administration of sedation, the patients esophagus was intubated and the [id number]  endoscope was advanced under direct visualization to the second portion of the duodenum.  The scope was removed slowly by carefully examining the color, texture, anatomy, and integrity of the mucosa on the way out.  The patient was recovered in endoscopy and discharged home in satisfactory condition.  Estimated blood loss is zero unless otherwise noted in this procedure report.    ESOPHAGUS: ESOPHAGEAL VARICES, TWO COLUMNS GRADEII-NO CHERRY RED SPOT/RED WALE SIGN.   STOMACH: Moderate gastropathy was found in the gastric body.   Mild erosive gastritis (inflammation) was found in the gastric antrum.  Multiple biopsies were performed using cold forceps.   DUODENUM: The duodenal mucosa showed no abnormalities in the bulb and 2nd part of the duodenum.  COMPLICATIONS: There were no immediate complications.  ENDOSCOPIC IMPRESSION: GRADE II ESOPHAGEAL VARICES MODERATE PORTAL HYPERTENSIVE GASTROPATHY MILD EROSIVE GASTRITIS ANEMIA LIKELY DUE TO MANY FACTORS: GASTRITIS, GASTROPATHY, COAGULOPATHY, CHRONIC  DISEASE  RECOMMENDATIONS: LOW NA DIET(2 gm) BID PPI CONTINUE LASIX, LACTULOSE AND XIFAXAN. INCREASE ALDACTONE TO BID. AWAIT BIOPSY REPEAT EGD IN 1 YEAR REFER FOR OUTPATEINT LIVER TRANSPLANT EVALUATION  REPEAT EXAM: eSigned:  Danie Binder, MD 03/04/15 3:15 PM Revised: 03/04/15 3:15 PM  CPT CODES: ICD CODES:  The ICD and CPT codes recommended by this software are interpretations from the data that the clinical staff has captured with the software.  The verification of the translation of this report to the ICD and CPT codes and modifiers is the sole responsibility of the health care institution and practicing physician where this report was generated.  [employer] will not be held responsible for the validity of the ICD and CPT codes included on this report.  AMA assumes no liability for data contained or not contained herein. CPT is a Designer, television/film set of the [employer].

## 2015-02-24 NOTE — Transfer of Care (Signed)
Immediate Anesthesia Transfer of Care Note  Patient: Whitney Bush  Procedure(s) Performed: Procedure(s) with comments: ESOPHAGOGASTRODUODENOSCOPY (EGD) WITH PROPOFOL (N/A) BIOPSY - gastric bx's  Patient Location: PACU  Anesthesia Type:MAC  Level of Consciousness: awake, alert  and oriented  Airway & Oxygen Therapy: Patient Spontanous Breathing  Post-op Assessment: Report given to RN  Post vital signs: Reviewed and stable  Last Vitals:  Filed Vitals:   02/24/15 1325 02/24/15 1330  BP: 108/66 108/66  Pulse:    Temp:    Resp: 17 17    Complications: No apparent anesthesia complications

## 2015-02-24 NOTE — Anesthesia Postprocedure Evaluation (Signed)
Anesthesia Post Note  Patient: Whitney Bush  Procedure(s) Performed: Procedure(s) (LRB): ESOPHAGOGASTRODUODENOSCOPY (EGD) WITH PROPOFOL (N/A) BIOPSY  Patient location during evaluation: PACU Anesthesia Type: MAC Level of consciousness: awake and alert and oriented Pain management: pain level controlled Vital Signs Assessment: post-procedure vital signs reviewed and stable Respiratory status: spontaneous breathing Cardiovascular status: blood pressure returned to baseline Anesthetic complications: no    Last Vitals:  Filed Vitals:   02/24/15 1325 02/24/15 1330  BP: 108/66 108/66  Pulse:    Temp:    Resp: 17 17    Last Pain:  Filed Vitals:   02/24/15 1330  PainSc: 7                  Jaquitta Dupriest

## 2015-02-24 NOTE — H&P (Signed)
Primary Care Physician:  Alphia Kava Primary Gastroenterologist:  Dr. Oneida Alar  Pre-Procedure History & Physical: HPI:  Whitney Bush is a 44 y.o. female here for Anemia/heme pos stools/sreen for varices.  Past Medical History  Diagnosis Date  . Hypertension   . Depression   . Migraines   . Peripheral edema   . Alcohol abuse     Past Surgical History  Procedure Laterality Date  . Abcess drainage    . Cesarean section      Prior to Admission medications   Medication Sig Start Date End Date Taking? Authorizing Provider  B Complex Vitamins (VITAMIN B-COMPLEX PO) Take 1 tablet by mouth daily.   Yes Historical Provider, MD  lisinopril-hydrochlorothiazide (PRINZIDE,ZESTORETIC) 20-25 MG tablet Take 1 tablet by mouth daily.   Yes Historical Provider, MD  Multiple Vitamin (MULTIVITAMIN WITH MINERALS) TABS tablet Take 1 tablet by mouth daily. 12/05/14  Yes Erline Hau, MD  spironolactone (ALDACTONE) 50 MG tablet Take 1 tablet (50 mg total) by mouth daily. 12/21/14  Yes Erline Hau, MD  vitamin E 400 UNIT capsule Take 400 Units by mouth daily.   Yes Historical Provider, MD  folic acid (FOLVITE) 1 MG tablet Take 1 tablet (1 mg total) by mouth daily. Patient not taking: Reported on 02/16/2015 12/05/14   Erline Hau, MD  pantoprazole (PROTONIX) 40 MG tablet Take 1 tablet (40 mg total) by mouth daily before breakfast. Patient not taking: Reported on 02/16/2015 12/05/14   Erline Hau, MD  prednisoLONE (ORAPRED ODT) 10 MG disintegrating tablet Take 40 mg a day for 28 days. Then take 30 mg a day for 5 days, then 20 mg a day for 5 days, then 20 mg a day for 5 days. Then stop. Patient not taking: Reported on 02/16/2015 12/10/14   Carlis Stable, NP    Allergies as of 02/16/2015  . (No Known Allergies)    Family History  Problem Relation Age of Onset  . Diabetes Father   . Cancer Other   . Cervical cancer Maternal  Grandmother   . Lung cancer Maternal Grandfather   . Alcohol abuse Other     multiple family members  . Colon cancer Neg Hx   . Liver disease Neg Hx     Social History   Social History  . Marital Status: Single    Spouse Name: N/A  . Number of Children: 1  . Years of Education: N/A   Occupational History  . unemployed    Social History Main Topics  . Smoking status: Former Smoker -- 1.00 packs/day for 5 years    Types: Cigarettes    Quit date: 12/29/2010  . Smokeless tobacco: Former Systems developer    Types: Snuff  . Alcohol Use: 2.4 oz/week    4 Glasses of wine per week     Comment: occassional, vodka historically as well.   . Drug Use: No  . Sexual Activity: Yes    Birth Control/ Protection: None   Other Topics Concern  . Not on file   Social History Narrative    Review of Systems: See HPI, otherwise negative ROS   Physical Exam: BP 108/66 mmHg  Pulse 110  Temp(Src) 99.1 F (37.3 C) (Oral)  Resp 17  Ht 5\' 3"  (1.6 m)  Wt 247 lb 1.6 oz (112.084 kg)  BMI 43.78 kg/m2  SpO2 100%  LMP 02/14/2015 General:   Alert,  pleasant and cooperative in NAD Head:  Normocephalic and atraumatic. Neck:  Supple; Lungs:  Clear throughout to auscultation.    Heart:  Regular rate and rhythm. Abdomen:  Soft, nontenderm Normal bowel sounds, without guarding, and without rebound.   Neurologic:  Alert and  oriented x4;  grossly normal neurologically.  Impression/Plan:    Anemia/heme pos stools/sreen for varices.  PLAN:  1. EGD TODAY

## 2015-02-25 ENCOUNTER — Telehealth: Payer: Self-pay | Admitting: Gastroenterology

## 2015-02-25 ENCOUNTER — Encounter (HOSPITAL_COMMUNITY): Payer: Self-pay | Admitting: Gastroenterology

## 2015-02-25 ENCOUNTER — Other Ambulatory Visit: Payer: Self-pay

## 2015-02-25 DIAGNOSIS — R601 Generalized edema: Secondary | ICD-10-CM

## 2015-02-25 DIAGNOSIS — Z949 Transplanted organ and tissue status, unspecified: Secondary | ICD-10-CM

## 2015-02-25 DIAGNOSIS — Z7689 Persons encountering health services in other specified circumstances: Secondary | ICD-10-CM

## 2015-02-25 DIAGNOSIS — D500 Iron deficiency anemia secondary to blood loss (chronic): Secondary | ICD-10-CM

## 2015-02-25 DIAGNOSIS — F1010 Alcohol abuse, uncomplicated: Secondary | ICD-10-CM

## 2015-02-25 DIAGNOSIS — K7460 Unspecified cirrhosis of liver: Secondary | ICD-10-CM

## 2015-02-25 LAB — BASIC METABOLIC PANEL
ANION GAP: 7 (ref 5–15)
BUN: 10 mg/dL (ref 6–20)
CHLORIDE: 95 mmol/L — AB (ref 101–111)
CO2: 33 mmol/L — ABNORMAL HIGH (ref 22–32)
Calcium: 8.7 mg/dL — ABNORMAL LOW (ref 8.9–10.3)
Creatinine, Ser: 1.01 mg/dL — ABNORMAL HIGH (ref 0.44–1.00)
GFR calc Af Amer: >60 mL/min (ref >60)
GLUCOSE: 100 mg/dL — AB (ref 65–99)
POTASSIUM: 3.4 mmol/L — AB (ref 3.5–5.1)
Sodium: 135 mmol/L (ref 135–145)

## 2015-02-25 MED ORDER — FUROSEMIDE 40 MG PO TABS
40.0000 mg | ORAL_TABLET | Freq: Two times a day (BID) | ORAL | Status: DC
  Administered 2015-02-25: 40 mg via ORAL
  Filled 2015-02-25: qty 1

## 2015-02-25 MED ORDER — FUROSEMIDE 40 MG PO TABS: 40.0000 mg | ORAL_TABLET | Freq: Two times a day (BID) | ORAL | Status: DC

## 2015-02-25 MED ORDER — THIAMINE HCL 100 MG PO TABS: 100.0000 mg | ORAL_TABLET | Freq: Every day | ORAL | Status: DC

## 2015-02-25 MED ORDER — LACTULOSE 10 GM/15ML PO SOLN: 10.0000 g | Freq: Every day | ORAL | Status: DC

## 2015-02-25 MED ORDER — SPIRONOLACTONE 100 MG PO TABS: 100.0000 mg | ORAL_TABLET | Freq: Two times a day (BID) | ORAL | Status: DC

## 2015-02-25 MED ORDER — OMEPRAZOLE 40 MG PO CPDR: 40.0000 mg | DELAYED_RELEASE_CAPSULE | Freq: Two times a day (BID) | ORAL | Status: DC

## 2015-02-25 NOTE — Telephone Encounter (Signed)
Referral made to [facility] transplant

## 2015-02-25 NOTE — Progress Notes (Signed)
Patient alert and oriented, independent, VSS, pt. Tolerating diet well. No complaints of pain or nausea. Pt. Had IV removed tip intact. Pt. Had prescriptions given. Pt. Voiced understanding of discharge instructions with no further questions. Pt. Discharged via wheelchair with auxilliary.  

## 2015-02-25 NOTE — Addendum Note (Signed)
Addendum  created 02/25/15 0914 by Ollen Bowl, CRNA   Modules edited: Clinical Notes   Clinical Notes:  File: [id number]

## 2015-02-25 NOTE — Progress Notes (Signed)
Subjective:  Feels much better. Less abdominal swelling but feels like swelling in legs worse today. No abdominal pain. Appetite good. Declines nutrition consult regarding 2gram sodium diet. States she understands it. No melena, brbpr.   Objective: Vital signs in last 24 hours: Temp:  [98.2 F (36.8 C)-98.9 F (37.2 C)] 98.3 F (36.8 C) (12/29 0644) Pulse Rate:  [97-109] 101 (12/29 0644) Resp:  [17-22] 20 (12/29 0644) BP: (94-124)/(47-78) 119/78 mmHg (12/29 0644) SpO2:  [92 %-100 %] 100 % (12/29 0644) Weight:  [244 lb 12.8 oz (111.041 kg)] 244 lb 12.8 oz (111.041 kg) (12/29 0506) Last BM Date: 02/24/15 General:   Alert,  Well-developed, well-nourished, pleasant and cooperative in NAD Head:  Normocephalic and atraumatic. Eyes:  Sclera clear, no icterus.  Abdomen:  Soft, nontender, mild distention. Some mild pitting edema in dependent areas. Normal bowel sounds, without guarding, and without rebound.   Extremities:  Without clubbing, deformity. 2+ pitting edema to mid calves bilaterally. Neurologic:  Alert and  oriented x4;  grossly normal neurologically. Skin:  Intact without significant lesions or rashes. Psych:  Alert and cooperative. Normal mood and affect.  Intake/Output from previous day: 12/28 0701 - 12/29 0700 In: 73 [P.O.:240; I.V.:350] Out: 600 [Urine:600] Intake/Output this shift:    Lab Results: CBC  Recent Labs  02/23/15 0826 02/24/15 0658  WBC 8.6 10.6*  HGB 8.2* 8.4*  HCT 23.8* 24.8*  MCV 88.8 89.9  PLT 111* 120*   BMET  Recent Labs  02/23/15 0826 02/24/15 0658 02/25/15 0558  NA 133* 135 135  K 3.3* 3.2* 3.4*  CL 95* 96* 95*  CO2 31 32 33*  GLUCOSE 135* 96 100*  BUN 10 10 10   CREATININE 1.03* 0.95 1.01*  CALCIUM 8.6* 8.5* 8.7*   LFTs  Recent Labs  02/23/15 1422  BILITOT 4.1*  BILIDIR 1.8*  IBILI 2.3*  ALKPHOS 56  AST 40  ALT 15  PROT 7.0  ALBUMIN 2.7*   No results for input(s): LIPASE in the last 72 hours. PT/INR  Recent  Labs  02/23/15 1422  LABPROT 22.1*  INR 1.94*      Imaging Studies: Dg Chest 2 View  02/16/2015  CLINICAL DATA:  Shortness of breath and nonproductive cough for 3 days EXAM: CHEST - 2 VIEW COMPARISON:  12/10/2014 FINDINGS: Cardiac shadow is stable. Elevation the right hemidiaphragm is noted with consolidation in the right middle and right lower lobe. No bony abnormality is noted. IMPRESSION: Consolidation in the right middle and right lower lobes. Followup PA and lateral chest X-ray is recommended in 3-4 weeks following trial of antibiotic therapy to ensure resolution and exclude underlying malignancy. Electronically Signed   By: Inez Catalina M.D.   On: 02/16/2015 16:46   Dg Chest Port 1 View  02/17/2015  CLINICAL DATA:  Right PICC line placement. EXAM: PORTABLE CHEST 1 VIEW COMPARISON:  02/16/2015. FINDINGS: The right PICC line tip is in the mid SVC just above the level of the carina. No complicating features. Persistent right pleural effusion and overlying atelectasis. The left lung is relatively clear. IMPRESSION: The right PICC line tip is in the mid SVC. Persistent right effusion and atelectasis. Electronically Signed   By: Marijo Sanes M.D.   On: 02/17/2015 19:38  [2 weeks]   Assessment: 44 year old admitted with cirrhosis, likely secondary to ETOH use, history of alcoholic hepatitis without any alcohol use for 3 weeks, admitted with pneumonia and anasarca. MELD 19. She has improved significantly from a respiratory standpoint and diuresed quite  well.   Acute on chronic anemia requiring transfusion this admission but without overt GI bleeding. Heme positive stool noted. Previous iron studies in October with normal iron and ferritin. EGD this admission with grade II esophageal varices without active bleeding, moderate portal hypertensive gastropathy, gastritis. Colonoscopy will need to be undertaken as an outpatient and strongly recommended as well. Further work-up of cirrhosis as  outpatient.   Need for ETOH cessation discussed at length. She states she would like to stop "on my own". Declining assistance.   Elevated ammonia: now on lactulose. Some concern for mild encephalopathy prior to admission.Currently at baseline and without any signs of encephalopathy.   As of note discriminant function between 36-52. Completed prednisolone that was prescribed in Oct 2016. Last ETOH intake several weeks ago. In setting of recent pneumonia would be hesitant to treat for possible ETOH hepatitis and doubt dealing with alcoholic hepatitis currently.   Plan: 2 gram sodium diet. Patient declined on nutrition consult. Will have nursing provide information. PPI BID Continue lasix, lactulose. Increase aldactone to bid. F/u pending bx. ETOH cessation. Repeat EGD in 1 year. Refer for outpatient liver transplant evaluation.  OV in 4-6 weeks to consider colonoscopy and follow up cirrhosis care.   Laureen Ochs. Bernarda Caffey [facility] [phone] 12/29/20169:15 AM     LOS: 9 days

## 2015-02-25 NOTE — Care Management Note (Addendum)
Case Management Note  Patient Details  Name: Whitney Bush MRN: [id number] Date of Birth: Jul 15, 1970 Expected Discharge Date:    02/25/2015              Expected Discharge Plan:  Home/Self Care  In-House Referral:  Financial Counselor  Discharge planning Services  CM Consult, [facility]  Post Acute Care Choice:  NA Choice offered to:  NA  DME Arranged:    DME Agency:     HH Arranged:    [place] Agency:     Status of Service:  Completed, signed off  Medicare Important Message Given:    Date Medicare IM Given:    Medicare IM give by:    Date Additional Medicare IM Given:    Additional Medicare Important Message give by:     If discussed at [place] of Stay Meetings, dates discussed:  02/25/2015  Additional Comments: Pt discharging home today with self care. Pt made f/u appointment at the [facility] and pt will f/u with GI. Pt encouraged to keep appointments and to call MD office if any questions or concerns arise about med needs or keeping appointments. Pt has already received MATCH voucher in the past year and understand she is not eligible for another. Pt has no further CM needs.   Sherald Barge, RN 02/25/2015, 12:13 PM

## 2015-02-25 NOTE — Telephone Encounter (Signed)
Called patient and [facility] regarding her OV appointment.  Mailed reminder card to pt.

## 2015-02-25 NOTE — Discharge Summary (Signed)
Physician Discharge Summary  Whitney Bush [id number] DOB: 19-Jul-1970 DOA: 02/16/2015  PCP: [place] date: 02/16/2015 Discharge date: 02/25/2015  Recommendations for Outpatient Follow-up:  1. Cirrhosis care. Repeat EGD in 1 year. 2. Follow up for possible liver transplant evaluation.  3. Follow up volume status, consider repeat BMP on follow-up.   Follow-up Information    Follow up with [facility] On 03/04/2015.   Why:  3:45pm - be there 30 mins before appointment    Contact information:   [address] [address] [phone]       Follow up with Manus Rudd, MD On 03/22/2015.   Specialty:  Gastroenterology   Why:  at 11:30 am   Contact information:   [address] [place] [address] [phone]      Discharge Diagnoses:  1. Anasarca secondary to cirrhosis 2. Anemia, multifactorial: gastritis, gastropathy, coagulopathy, chronic disease. 3. Cirrhosis with hypoalbuminemia, hyponatremia, esophageal varices, moderate portal hypertensive gastropathy and coagulopathy.  4. HCAP.  5. Trichomoniasis. 6. Alcohol abuse. 7. Hypokalemia.  Discharge Condition: Improved Disposition: home  Diet recommendation: Heart healthy  Filed Weights   02/23/15 0658 02/24/15 0448 02/25/15 0506  Weight: 114.17 kg (251 lb 11.2 oz) 112.084 kg (247 lb 1.6 oz) 111.041 kg (244 lb 12.8 oz)    History of present illness:  47 yow PMH ETOH cirrhosis and hepatitis, presented with edema and SOB. CXR reveal right and middle love pna that has been treated with 7 day course of abx. She was grossly volume overloaded and had anasarca. She was treated with IV lasix and had significant diuresis. Her hospital course has been complicated by the development of anemia and Hgb of 6.5, requiring transfusion of 2u PRBCs. Stool occult blood was positive and GI  was consulted.  Hospital Course:  Patient presented with evidence of significant  fluid overload with anasarca secondary to hepatic cirrhosis and medication noncompliance. She was started on IV Lasix and Aldactone with gradual improvement. She is currently down 36lbs since admission and will be continued on oral Lasix, Lactulose, and Aldactone as an outpatient. On 12/24, patient's Hgb dropped to 6.5. Her stool occult blood was positive however she has no evidence of gross bleeding. She was transfused 2U of PRBCs and her hgb has since remained stable at her baseline. GI was consulted and performed an EGD on 12/28 that revealed multifactorial anemia secondary to gastritis, gastropathy, coagulopathy, and chronic disease. She will need a repeat EGD in 1 year.  HCAP was treated with a 7 day course of abx. She is afebrile with a normal WBC.  Individual issues as below:  1. Anasarca and fluid overload, hx of hepatic cirrhosis. Markedly improved with IV Lasix and Aldactone. Will transition to PO Lasix and increase Aldactone per GI. Weight has decreased by 36 pounds since admission.  2. Anemia, multifactorial: gastritis, gastropathy, coagulopathy, chronic disease. Hgb at baseline s/p transfusion 2 units PRBC. Stool occult blood positive without any evidence of gross bleeding.S/p EGD with multifactorial etiology of anemia. 3. Cirrhosis with hypoalbuminemia, hyponatremia, esophageal varices, moderate portal hypertensive gastropathy and coagulopathy. Appears stable. She will need outpatient follow up for possible liver transplant.  4. HCAP, resolved. Afebrile, WBC wnl. Treated with 7 days of abx.  5. Trichomoniasis, treated with single dose of Flagyl. Negative HIV.  6. Alcohol abuse, counseled on the importance of cessation.  7. Hypokalemia, replaced.   Consultants:  GI  Procedures:  Transfused 2 units PRBC  EGD ENDOSCOPIC IMPRESSION: GRADE II ESOPHAGEAL VARICES MODERATE PORTAL HYPERTENSIVE GASTROPATHY MILD EROSIVE GASTRITIS ANEMIA LIKELY DUE TO MANY FACTORS: GASTRITIS,  GASTROPATHY, COAGULOPATHY, CHRONIC DISEASE  Antibiotics:  Vancomycin 12/20>>12/24  Cefepime 12/20>>12/24  Levofloxacin 12/24>>12/26  Discharge Instructions Discharge Instructions    Activity as tolerated - No restrictions    Complete by:  As directed      Diet - low sodium heart healthy    Complete by:  As directed      Discharge instructions    Complete by:  As directed   Call your physician or seek immediate medical attention for shortness of breath, swelling, weight gain more than 3 pounds. Weigh self daily and keep a log book. Call physician or seek medical attention for weight gain. Limit water intake to 1.5 liters per day.            Discharge Medication List as of 02/25/2015 12:19 PM    START taking these medications   Details  furosemide (LASIX) 40 MG tablet Take 1 tablet (40 mg total) by mouth 2 (two) times daily., Starting 02/25/2015, Until Discontinued, Normal    lactulose (CHRONULAC) 10 GM/15ML solution Take 15 mLs (10 g total) by mouth daily., Starting 02/25/2015, Until Discontinued, Normal    omeprazole (PRILOSEC) 40 MG capsule Take 1 capsule (40 mg total) by mouth 2 (two) times daily., Starting 02/25/2015, Until Discontinued, Normal    thiamine 100 MG tablet Take 1 tablet (100 mg total) by mouth daily., Starting 02/25/2015, Until Discontinued, Normal      CONTINUE these medications which have CHANGED   Details  spironolactone (ALDACTONE) 100 MG tablet Take 1 tablet (100 mg total) by mouth 2 (two) times daily., Starting 02/25/2015, Until Discontinued, Print      CONTINUE these medications which have NOT CHANGED   Details  B Complex Vitamins (VITAMIN B-COMPLEX PO) Take 1 tablet by mouth daily., Until Discontinued, Historical Med    Multiple Vitamin (MULTIVITAMIN WITH MINERALS) TABS tablet Take 1 tablet by mouth daily., Starting 12/05/2014, Until Discontinued, OTC    vitamin E 400 UNIT capsule Take 400 Units by mouth daily., Until Discontinued, Historical  Med    folic acid (FOLVITE) 1 MG tablet Take 1 tablet (1 mg total) by mouth daily., Starting 12/05/2014, Until Discontinued, OTC      STOP taking these medications     lisinopril-hydrochlorothiazide (PRINZIDE,ZESTORETIC) 20-25 MG tablet      pantoprazole (PROTONIX) 40 MG tablet      prednisoLONE (ORAPRED ODT) 10 MG disintegrating tablet        No Known Allergies  The results of significant diagnostics from this hospitalization (including imaging, microbiology, ancillary and laboratory) are listed below for reference.    Significant Diagnostic Studies: Dg Chest 2 View  02/16/2015  CLINICAL DATA:  Shortness of breath and nonproductive cough for 3 days EXAM: CHEST - 2 VIEW COMPARISON:  12/10/2014 FINDINGS: Cardiac shadow is stable. Elevation the right hemidiaphragm is noted with consolidation in the right middle and right lower lobe. No bony abnormality is noted. IMPRESSION: Consolidation in the right middle and right lower lobes. Followup PA and lateral chest X-ray is recommended in 3-4 weeks following trial of antibiotic therapy to ensure resolution and exclude underlying malignancy. Electronically Signed   By: Inez Catalina M.D.   On: 02/16/2015 16:46   Dg Chest Port 1 View  02/17/2015  CLINICAL DATA:  Right PICC line placement. EXAM: PORTABLE CHEST 1 VIEW COMPARISON:  02/16/2015. FINDINGS: The right PICC line tip is in  the mid SVC just above the level of the carina. No complicating features. Persistent right pleural effusion and overlying atelectasis. The left lung is relatively clear. IMPRESSION: The right PICC line tip is in the mid SVC. Persistent right effusion and atelectasis. Electronically Signed   By: Marijo Sanes M.D.   On: 02/17/2015 19:38    Microbiology: Recent Results (from the past 240 hour(s))  Blood culture (routine x 2)     Status: None   Collection Time: 02/16/15  5:15 PM  Result Value Ref Range Status   Specimen Description BLOOD LEFT ARM  Final   Special  Requests BOTTLES DRAWN AEROBIC AND ANAEROBIC 4CC EACH  Final   Culture NO GROWTH 5 DAYS  Final   Report Status 02/21/2015 FINAL  Final  Blood culture (routine x 2)     Status: None   Collection Time: 02/16/15  5:20 PM  Result Value Ref Range Status   Specimen Description BLOOD LEFT HAND  Final   Special Requests BOTTLES DRAWN AEROBIC ONLY 4CC  Final   Culture NO GROWTH 5 DAYS  Final   Report Status 02/21/2015 FINAL  Final  Culture, sputum-assessment     Status: None   Collection Time: 02/19/15  8:38 AM  Result Value Ref Range Status   Specimen Description SPU  Final   Special Requests NONE  Final   Sputum evaluation THIS SPECIMEN IS ACCEPTABLE FOR SPUTUM CULTURE  Final   Report Status 02/19/2015 FINAL  Final  Culture, respiratory (NON-Expectorated)     Status: None   Collection Time: 02/19/15  8:38 AM  Result Value Ref Range Status   Specimen Description SPU  Final   Special Requests NONE  Final   Gram Stain   Final    NO WBC SEEN FEW SQUAMOUS EPITHELIAL CELLS PRESENT FEW GRAM POSITIVE RODS RARE GRAM NEGATIVE RODS RARE GRAM POSITIVE COCCI    Culture   Final    NORMAL OROPHARYNGEAL FLORA Performed at [facility]    Report Status 02/21/2015 FINAL  Final     Labs: Basic Metabolic Panel:  Recent Labs Lab 02/20/15 0641 02/21/15 1123 02/22/15 0636 02/23/15 0826 02/24/15 0658 02/25/15 0558  NA 132* 134* 134* 133* 135 135  K 3.4* 3.3* 3.5 3.3* 3.2* 3.4*  CL 95* 94* 96* 95* 96* 95*  CO2 29 32 33* 31 32 33*  GLUCOSE 108* 106* 126* 135* 96 100*  BUN 9 8 9 10 10 10   CREATININE 1.09* 1.08* 1.12* 1.03* 0.95 1.01*  CALCIUM 8.3* 8.6* 8.5* 8.6* 8.5* 8.7*  MG 1.6* 1.6*  --   --   --   --    Liver Function Tests:  Recent Labs Lab 02/20/15 0641 02/22/15 0636 02/23/15 1422  AST 44* 36 40  ALT 18 16 15   ALKPHOS 78 59 56  BILITOT 4.0* 3.8* 4.1*  PROT 7.2 7.0 7.0  ALBUMIN 2.0* 2.6* 2.7*   CBC:  Recent Labs Lab 02/22/15 0636 02/23/15 0826 02/24/15 0658    WBC 8.8 8.6 10.6*  HGB 6.5* 8.2* 8.4*  HCT 19.6* 23.8* 24.8*  MCV 92.0 88.8 89.9  PLT 97* 111* 120*       Recent Labs  07/08/14 0912 02/16/15 1555  BNP 22.0 242.0*    Principal Problem:   Anasarca Active Problems:   Essential hypertension   Alcohol abuse   Cirrhosis (HCC)   HCAP (healthcare-associated pneumonia)   Dyspnea   Hypokalemia   Alcoholic cirrhosis of liver with ascites (HCC)   Heme  positive stool   Time coordinating discharge: 35 minutes  Signed:  Murray Hodgkins, MD [facility] 02/25/2015, 8:21 AM  By signing my name below, I, Rosalie Doctor attest that this documentation has been prepared under the direction and in the presence of Murray Hodgkins, MD Electronically signed: Rosalie Doctor, Scribe.  02/25/2015  I personally performed the services described in this documentation. All medical record entries made by the scribe were at my direction. I have reviewed the chart and agree that the record reflects my personal performance and is accurate and complete. Murray Hodgkins, MD

## 2015-02-25 NOTE — Progress Notes (Signed)
Removed PICC line . Documentation stated it was 43 cm but when PICC line pulled it was 46 cm

## 2015-02-25 NOTE — Anesthesia Postprocedure Evaluation (Signed)
Anesthesia Post Note  Patient: Whitney Bush  Procedure(s) Performed: Procedure(s) (LRB): ESOPHAGOGASTRODUODENOSCOPY (EGD) WITH PROPOFOL (N/A) BIOPSY  Patient location during evaluation: Nursing Unit Anesthesia Type: MAC Level of consciousness: awake and alert and oriented Pain management: pain level controlled Respiratory status: spontaneous breathing Cardiovascular status: blood pressure returned to baseline Postop Assessment: adequate PO intake Anesthetic complications: no    Last Vitals:  Filed Vitals:   02/24/15 2147 02/25/15 0644  BP: 99/47 119/78  Pulse: 109 101  Temp: 37.2 C 36.8 C  Resp: 20 20    Last Pain:  Filed Vitals:   02/25/15 0645  PainSc: 6                  Gazella Anglin

## 2015-02-25 NOTE — Telephone Encounter (Signed)
Please schedule for hospital follow up in 4-6 weeks for cirrhosis care, consider colonoscopy for anemia.  Please arrange for appointment at liver transplant center of patient's choice, [facility], [place], [facility].

## 2015-02-25 NOTE — Progress Notes (Signed)
PROGRESS NOTE  Whitney Bush [id number] DOB: [birth year] DOA: 02/16/2015 PCP: [place]  Summary: 89 yom PMHx of ETOH cirrhosis and hepatitis, presented with complaints of edema and SOB. CXR revealed right and middle lobe pna that has been treated with 7 day course of abx. She was grossly volume overloaded and had anasarca. She was treated with IV lasix and had significant diuresis. Her hospital course has been complicated by the development of anemia and Hgb of 6.5, requiring transfusion of 2u PRBCs. Stool occult blood was positive and GI has been consulted.  Assessment/Plan: 1. Anasarca and fluid overload, hx of hepatic cirrhosis. Markedly improved with IV Lasix and Aldactone. Will transition to PO Lasix and increase Aldactone per GI. Weight has decreased by 36 pounds since admission.  2. Anemia, multifactorial: gastritis, gastropathy, coagulopathy, chronic disease. Hgb at baseline s/p transfusion 2 units PRBC. Stool occult blood positive without any evidence of gross bleeding.S/p EGD with multifactorial etiology of anemia. 3. Cirrhosis with hypoalbuminemia, hyponatremia, esophageal varices, moderate portal hypertensive gastropathy and coagulopathy. Appears stable. She will need outpatient follow up for possible liver transplant.  4. HCAP, resolved. Afebrile, WBC wnl. Treated with 7 days of abx.  5. Trichomoniasis, treated with single dose of Flagyl. Negative HIV.  6. Alcohol abuse, counseled on the importance of cessation.  7. Hypokalemia, replaced.   Overall improved.  Discharge home today on Lasix, Aldactone, lactulose.  Code Status: Full DVT prophylaxis: SCDs Family Communication: No family at bedside.  Disposition Plan: home today  Murray Hodgkins, MD  [facility]  Pager [phone] If 7PM-7AM, please contact night-coverage at [email or web address], password [facility] 02/25/2015, 7:19 AM  LOS: 9 days   Consultants:  GI  Procedures:  Transfused 2  units PRBC  EGD ENDOSCOPIC IMPRESSION: GRADE II ESOPHAGEAL VARICES MODERATE PORTAL HYPERTENSIVE GASTROPATHY MILD EROSIVE GASTRITIS ANEMIA LIKELY DUE TO MANY FACTORS: GASTRITIS, GASTROPATHY, COAGULOPATHY, CHRONIC DISEASE  Antibiotics:  Vancomycin 12/20>>12/24  Cefepime 12/20>>12/24  Levofloxacin 12/24>>12/26  HPI/Subjective: Feels much improved. Swelling decreased.  Objective: Filed Vitals:   02/24/15 1515 02/24/15 2147 02/25/15 0506 02/25/15 0644  BP: 102/63 99/47  119/78  Pulse: 97 109  101  Temp:  98.9 F (37.2 C)  98.3 F (36.8 C)  TempSrc:  Oral  Oral  Resp: 20 20  20   Height:      Weight:   111.041 kg (244 lb 12.8 oz)   SpO2: 92% 98%  100%    Intake/Output Summary (Last 24 hours) at 02/25/15 0719 Last data filed at 02/24/15 2100  Gross per 24 hour  Intake    590 ml  Output    600 ml  Net    -10 ml     Filed Weights   02/23/15 0658 02/24/15 0448 02/25/15 0506  Weight: 114.17 kg (251 lb 11.2 oz) 112.084 kg (247 lb 1.6 oz) 111.041 kg (244 lb 12.8 oz)    Exam:    VSS, afebrile. General:  Appears calm and comfortable Cardiovascular: RRR, no m/r/g. 2+ BLE edema. Some abdominal wall edema. Respiratory: CTA bilaterally, no w/r/r. Normal respiratory effort. Abdomen: soft, ntnd Musculoskeletal: grossly normal tone BUE/BLE Psychiatric: grossly normal mood and affect, speech fluent and appropriate   New data reviewed:  Potassium 3.4, Creatinine 1.01  -36 lbs since admission.  Scheduled Meds: . folic acid  1 mg Oral Daily  . furosemide  40 mg Intravenous BID  . guaiFENesin  1,200 mg Oral BID  . lactulose  30 g Oral Daily  . multivitamin with  minerals  1 tablet Oral Daily  . pantoprazole  40 mg Oral BID AC  . spironolactone  100 mg Oral BID  . thiamine  100 mg Oral Daily   Continuous Infusions:   Principal Problem:   Anasarca Active Problems:   Essential hypertension   Alcohol abuse   Cirrhosis ([place])   HCAP (healthcare-associated  pneumonia)   Dyspnea   Hypokalemia   Alcoholic cirrhosis of liver with ascites ([place])   Heme positive stool   By signing my name below, I, Rosalie Doctor attest that this documentation has been prepared under the direction and in the presence of Murray Hodgkins, MD Electronically signed: Rosalie Doctor, Scribe.  02/25/2015  I personally performed the services described in this documentation. All medical record entries made by the scribe were at my direction. I have reviewed the chart and agree that the record reflects my personal performance and is accurate and complete. Murray Hodgkins, MD

## 2015-02-27 LAB — HCV RNA QUANT RFLX ULTRA OR GENOTYP
HCV RNA Qnt(log copy/mL): UNDETERMINED log10 IU/mL
HepC Qn: NOT DETECTED IU/mL

## 2015-03-04 ENCOUNTER — Telehealth: Payer: Self-pay | Admitting: Gastroenterology

## 2015-03-04 NOTE — Telephone Encounter (Signed)
Reminder in epic °

## 2015-03-04 NOTE — Telephone Encounter (Signed)
ON RECALL  °

## 2015-03-04 NOTE — Telephone Encounter (Signed)
Pt is aware.  Please nic

## 2015-03-04 NOTE — Telephone Encounter (Signed)
Please call pt. HER stomach Bx shows mild gastritis.   FOLLOW A LOW SALT DIET. TAKE OMEPRAZOLE ONCE OR TWICE DAILY. CONTINUE LASIX/ALDACTONE TWICE DAILY. REPEAT EGD IN ONE YEAR WITH DR. Gala Romney.

## 2015-03-22 ENCOUNTER — Ambulatory Visit (INDEPENDENT_AMBULATORY_CARE_PROVIDER_SITE_OTHER): Payer: Self-pay | Admitting: Gastroenterology

## 2015-03-22 ENCOUNTER — Encounter: Payer: Self-pay | Admitting: Gastroenterology

## 2015-03-22 VITALS — BP 115/73 | HR 92 | Temp 98.3°F | Ht 62.0 in | Wt 194.8 lb

## 2015-03-22 DIAGNOSIS — K7031 Alcoholic cirrhosis of liver with ascites: Secondary | ICD-10-CM

## 2015-03-22 DIAGNOSIS — D500 Iron deficiency anemia secondary to blood loss (chronic): Secondary | ICD-10-CM

## 2015-03-22 MED ORDER — FUROSEMIDE 40 MG PO TABS: 40.0000 mg | ORAL_TABLET | Freq: Two times a day (BID) | ORAL | Status: DC

## 2015-03-22 MED ORDER — SPIRONOLACTONE 100 MG PO TABS: 100.0000 mg | ORAL_TABLET | Freq: Two times a day (BID) | ORAL | Status: DC

## 2015-03-22 NOTE — Assessment & Plan Note (Addendum)
Plan for colonoscopy in the next couple months. Sooner if any overt GI bleeding. Await cone financial assistance per patient request.

## 2015-03-22 NOTE — Progress Notes (Signed)
cc'ed to pcp °

## 2015-03-22 NOTE — Progress Notes (Signed)
Primary Care Physician: Shade Flood, MD  Primary Gastroenterologist:  Garfield Cornea, MD   Chief Complaint  Patient presents with  . Follow-up    HPI: Whitney Bush is a 45 y.o. female here for hospital follow-up. She has a history of cirrhosis due to alcohol/alcoholic hepatitis. Also anemia requiring transfusion. 3 hospitalizations since October. Last time released about 3 weeks ago. She presented with significant anasarca at that time and pneumonia.  Patient's maximum weight was 278 back in 02/04/2015. She was 280 pounds on December 20 which presented for admission. At time of discharge she weighed 244 pounds. Today she is down to 198. She states she tried stopping Lasix and Aldactone for about 3 days but the fluid returned. Trying to follow low sodium diet, no more than 2 g daily. No alcohol in 2 months. Currently living with her mother who provides her with support. Patient has no complaints. She has a bowel movement about 3 times weekly. No blood in the stool or melena. Uses lactulose sometimes. No heartburn or dysphagia. Denies abdominal pain. Appetite okay. She notes her jaundice has dramatically improved.  Medicaid denied. Currently plans to apply for [employer] .   As far as cirrhosis care, she has had an EGD done. She had esophageal varices, 2 columns grade 2. Moderate gastropathy, mild erosive gastritis, reactive gastropathy on pathology but no H. pylori. Plans for repeat EGD in 1 year. For all has been made to [facility] transplant center however without Medicaid or insurance this may be a issue.  She has had profound anemia requiring transfusion. Heme positive. Plan for outpatient colonoscopy from the beginning and will try to have that done the upcoming couple of months. Patient wants to apply for cone financial assistance first. No overt GI bleeding noted.  Current Outpatient Prescriptions  Medication Sig Dispense Refill  . citalopram (CELEXA) 20 MG  tablet Take 20 mg by mouth daily.    . folic acid (FOLVITE) 1 MG tablet Take 1 tablet (1 mg total) by mouth daily.    . furosemide (LASIX) 40 MG tablet Take 1 tablet (40 mg total) by mouth 2 (two) times daily. 60 tablet 0  . lactulose (CHRONULAC) 10 GM/15ML solution Take 15 mLs (10 g total) by mouth daily. 473 mL 0  . Multiple Vitamin (MULTIVITAMIN WITH MINERALS) TABS tablet Take 1 tablet by mouth daily.    Marland Kitchen spironolactone (ALDACTONE) 100 MG tablet Take 1 tablet (100 mg total) by mouth 2 (two) times daily. 60 tablet 0  . vitamin E 400 UNIT capsule Take 400 Units by mouth daily.    . B Complex Vitamins (VITAMIN B-COMPLEX PO) Take 1 tablet by mouth daily.     No current facility-administered medications for this visit.    Allergies as of 03/22/2015  . (No Known Allergies)   Past Medical History  Diagnosis Date  . Hypertension   . Depression   . Migraines   . Peripheral edema   . Alcohol abuse    Past Surgical History  Procedure Laterality Date  . Abcess drainage    . Cesarean section    . Esophagogastroduodenoscopy (egd) with propofol N/A 02/24/2015    SLF: Grade II esophageal varices Moderate portal hypertensive gastropathy Mild erosive gastritis anemia likely due to many factors: gastritis, gastropathy, coagulopathy, chronic disease  . Esophageal biopsy  02/24/2015    Procedure: BIOPSY;  Surgeon: Danie Binder, MD;  Location: [facility];  Service: Endoscopy;;  gastric bx's  Family History  Problem Relation Age of Onset  . Diabetes Father   . Cancer Other   . Cervical cancer Maternal Grandmother   . Lung cancer Maternal Grandfather   . Alcohol abuse Other     multiple family members  . Colon cancer Neg Hx   . Liver disease Neg Hx    Social History   Social History  . Marital Status: Single    Spouse Name: N/A  . Number of Children: 1  . Years of Education: N/A   Occupational History  . unemployed    Social History Main Topics  . Smoking status: Former  Smoker -- 1.00 packs/day for 5 years    Types: Cigarettes    Quit date: 12/29/2010  . Smokeless tobacco: Former Systems developer    Types: Snuff  . Alcohol Use: 2.4 oz/week    4 Glasses of wine per week     Comment: occassional, vodka historically as well.   . Drug Use: No  . Sexual Activity: Yes    Birth Control/ Protection: None   Other Topics Concern  . Not on file   Social History Narrative    ROS:  General: Negative for anorexia, weight loss, fever, chills, fatigue, weakness. ENT: Negative for hoarseness, difficulty swallowing , nasal congestion. CV: Negative for chest pain, angina, palpitations, dyspnea on exertion, peripheral edema.  Respiratory: Negative for dyspnea at rest, dyspnea on exertion, cough, sputum, wheezing.  GI: See history of present illness. GU:  Negative for dysuria, hematuria, urinary incontinence, urinary frequency, nocturnal urination.  Endo: Negative for unusual weight change.    Physical Examination:   BP 115/73 mmHg  Pulse 92  Temp(Src) 98.3 F (36.8 C) (Oral)  Ht 5\' 2"  (1.575 m)  Wt 194 lb 12.8 oz (88.361 kg)  BMI 35.62 kg/m2  LMP 02/11/2015  General: Well-nourished, well-developed in no acute distress.  Eyes: No icterus. Mouth: Oropharyngeal mucosa moist and pink , no lesions erythema or exudate. Lungs: Clear to auscultation bilaterally.  Heart: Regular rate and rhythm, no murmurs rubs or gallops.  Abdomen: Bowel sounds are normal, nontender, nondistended, no hepatosplenomegaly or masses, no abdominal bruits or hernia , no rebound or guarding.   Extremities: No lower extremity edema. No clubbing or deformities. Neuro: Alert and oriented x 4   Skin: Warm and dry, no jaundice.   Psych: Alert and cooperative, normal mood and affect.  Labs:  Lab Results  Component Value Date   WBC 10.6* 02/24/2015   HGB 8.4* 02/24/2015   HCT 24.8* 02/24/2015   MCV 89.9 02/24/2015   PLT 120* 02/24/2015   Lab Results  Component Value Date   CREATININE 1.01*  02/25/2015   BUN 10 02/25/2015   NA 135 02/25/2015   K 3.4* 02/25/2015   CL 95* 02/25/2015   CO2 33* 02/25/2015   Lab Results  Component Value Date   ALT 15 02/23/2015   AST 40 02/23/2015   ALKPHOS 56 02/23/2015   BILITOT 4.1* 02/23/2015   Lab Results  Component Value Date   INR 1.94* 02/23/2015   INR 1.87* 02/16/2015   INR 1.93* 12/14/2014    Imaging Studies: No results found.

## 2015-03-22 NOTE — Assessment & Plan Note (Signed)
Clinically much improved. Anasarca resolving. Patient tried coming off of diuretics but started having recurrent edema. We'll readdress in the near future to consider decreasing dose. Labs ordered today for kidney function as well as to calculate meld. Also checking for immune status to hepatitis A and B and will vaccinate as appropriate. Discussed at length with patient regarding need to avoid alcohol forever. Currently 2 months without alcohol use.  EGD in 1 year.  Return to the office in 8 weeks.

## 2015-03-22 NOTE — Patient Instructions (Signed)
1. Please have your labs done. 2. You will be due for abdominal ultrasound in 05/2015. We will call and set up when it is time. 3. Return to the office in 8 weeks. Plan to schedule colonoscopy at that time.  4. Continue to avoid ALL alcohol. You are doing a great job!

## 2015-05-17 ENCOUNTER — Ambulatory Visit: Payer: Self-pay | Admitting: Gastroenterology

## 2015-06-04 ENCOUNTER — Other Ambulatory Visit: Payer: Self-pay

## 2015-06-04 ENCOUNTER — Encounter: Payer: Self-pay | Admitting: Gastroenterology

## 2015-06-04 ENCOUNTER — Ambulatory Visit (INDEPENDENT_AMBULATORY_CARE_PROVIDER_SITE_OTHER): Payer: Medicaid Other | Admitting: Gastroenterology

## 2015-06-04 VITALS — BP 127/78 | HR 89 | Temp 98.0°F | Ht 63.0 in | Wt 189.2 lb

## 2015-06-04 DIAGNOSIS — D62 Acute posthemorrhagic anemia: Secondary | ICD-10-CM | POA: Diagnosis not present

## 2015-06-04 DIAGNOSIS — L0292 Furuncle, unspecified: Secondary | ICD-10-CM | POA: Diagnosis not present

## 2015-06-04 DIAGNOSIS — K7030 Alcoholic cirrhosis of liver without ascites: Secondary | ICD-10-CM

## 2015-06-04 DIAGNOSIS — K625 Hemorrhage of anus and rectum: Secondary | ICD-10-CM

## 2015-06-04 HISTORY — DX: Furuncle, unspecified: L02.92

## 2015-06-04 MED ORDER — PEG 3350-KCL-NA BICARB-NACL 420 G PO SOLR: 4000.0000 mL | Freq: Once | ORAL | Status: DC

## 2015-06-04 MED ORDER — DOXYCYCLINE HYCLATE 100 MG PO CAPS: 100.0000 mg | ORAL_CAPSULE | Freq: Two times a day (BID) | ORAL | Status: DC

## 2015-06-04 MED ORDER — LACTULOSE 10 GM/15ML PO SOLN: 10.0000 g | Freq: Two times a day (BID) | ORAL | Status: DC

## 2015-06-04 NOTE — Progress Notes (Signed)
Primary Care Physician: Shade Flood, MD  Primary Gastroenterologist:  Garfield Cornea, MD   Chief Complaint  Patient presents with  . Follow-up    HPI: Whitney Bush is a 45 y.o. female here for three-month follow-up. She has a history of cirrhosis due to alcohol, alcoholic hepatitis, transfusion dependent anemia, anasarca. At her maximum weight with anasarca she was 280 pounds. When I saw her 3 months ago she was 198. Today she is stable.  As far as cirrhosis care, she has had an EGD done. She had esophageal varices, 2 columns grade 2. Moderate gastropathy, mild erosive gastritis, reactive gastropathy on pathology but no H. pylori. Plans for repeat EGD in 1 year. She had to cancel Her appointment at [facility] transplant center last month because her Medicaid was still pending. She plans to call and reschedule.  Patient complains of boil on lower abdomen present for 2-3 weeks. No drainage. No feverl. it is painful. Some mild LE edema. Spots of blood in stool. Stool is hard. Ran out of lactulose. Utilizing stool softeners but they are not helping. Complains of headaches. No abdominal pain. Appetite is okay. She had a relapse and consumes 2-3 glasses of wine at a party within the past week. She states she had been doing well up until that time with her sobriety. She asked me several times regarding certain alcoholic beverages whether it would be okay. She asked if she could do "Jell-O shots". She asked about "rum cake".    Current Outpatient Prescriptions  Medication Sig Dispense Refill  . B Complex Vitamins (VITAMIN B-COMPLEX PO) Take 1 tablet by mouth daily.    . citalopram (CELEXA) 20 MG tablet Take 20 mg by mouth daily.    . folic acid (FOLVITE) 1 MG tablet Take 1 tablet (1 mg total) by mouth daily.    . furosemide (LASIX) 40 MG tablet Take 1 tablet (40 mg total) by mouth 2 (two) times daily. 60 tablet 2  . lactulose (CHRONULAC) 10 GM/15ML solution Take 15 mLs (10 g total) by  mouth daily. 473 mL 0  . Multiple Vitamin (MULTIVITAMIN WITH MINERALS) TABS tablet Take 1 tablet by mouth daily.    Marland Kitchen spironolactone (ALDACTONE) 100 MG tablet Take 1 tablet (100 mg total) by mouth 2 (two) times daily. 60 tablet 2  . vitamin E 400 UNIT capsule Take 400 Units by mouth daily.     No current facility-administered medications for this visit.    Allergies as of 06/04/2015  . (No Known Allergies)   Past Medical History  Diagnosis Date  . Hypertension   . Depression   . Migraines   . Peripheral edema   . Alcohol abuse    Past Surgical History  Procedure Laterality Date  . Abcess drainage    . Cesarean section    . Esophagogastroduodenoscopy (egd) with propofol N/A 02/24/2015    SLF: Grade II esophageal varices Moderate portal hypertensive gastropathy Mild erosive gastritis anemia likely due to many factors: gastritis, gastropathy, coagulopathy, chronic disease  . Biopsy  02/24/2015    Procedure: BIOPSY;  Surgeon: Danie Binder, MD;  Location: [facility];  Service: Endoscopy;;  gastric bx's   Family History  Problem Relation Age of Onset  . Diabetes Father   . Cancer Other   . Cervical cancer Maternal Grandmother   . Lung cancer Maternal Grandfather   . Alcohol abuse Other     multiple family members  . Colon cancer Neg Hx   .  Liver disease Neg Hx    Social History   Social History  . Marital Status: Single    Spouse Name: N/A  . Number of Children: 1  . Years of Education: N/A   Occupational History  . unemployed    Social History Main Topics  . Smoking status: Former Smoker -- 1.00 packs/day for 5 years    Types: Cigarettes    Quit date: 12/29/2010  . Smokeless tobacco: Former Systems developer    Types: Snuff  . Alcohol Use: 2.4 oz/week    4 Glasses of wine per week     Comment: occassional, vodka historically as well. Patient states she quit 12/2014 but relapse 04/2015   . Drug Use: No  . Sexual Activity: Yes    Birth Control/ Protection: None   Other  Topics Concern  . None   Social History Narrative    ROS:  General: Negative for anorexia, weight loss, fever, chills, fatigue, weakness. ENT: Negative for hoarseness, difficulty swallowing , nasal congestion. CV: Negative for chest pain, angina, palpitations, dyspnea on exertion, peripheral edema.  Respiratory: Negative for dyspnea at rest, dyspnea on exertion, cough, sputum, wheezing.  GI: See history of present illness. GU:  Negative for dysuria, hematuria, urinary incontinence, urinary frequency, nocturnal urination.  Endo: Negative for unusual weight change.    Physical Examination:   BP 127/78 mmHg  Pulse 89  Temp(Src) 98 F (36.7 C) (Oral)  Ht 5\' 3"  (1.6 m)  Wt 189 lb 3.2 oz (85.821 kg)  BMI 33.52 kg/m2  LMP 06/01/2015  General: Well-nourished, well-developed in no acute distress.  Eyes: No icterus. Mouth: Oropharyngeal mucosa moist and pink , no lesions erythema or exudate. Lungs: Clear to auscultation bilaterally.  Heart: Regular rate and rhythm, no murmurs rubs or gallops.  Abdomen: Bowel sounds are normal, nontender, nondistended, no hepatosplenomegaly or masses, no abdominal bruits or hernia , no rebound or guarding.  3 cm round indurated tender lesion in the right lower abdomen without any evidence of drainage. Extremities: No lower extremity edema. No clubbing or deformities. Neuro: Alert and oriented x 4   Skin: Warm and dry, no jaundice.   Psych: Alert and cooperative, normal mood and affect.  Labs:  Patient did not have her labs done in January due to lack of insurance. Imaging Studies: No results found.   Impression/plan: 45 year old female with history of alcoholic cirrhosis, previously with significant anasarca who presents for follow-up. She has done well with regards to the anasarca. She dropped a tremendous amount of weight since her hospitalizations. Her weight has been stable now for 2 months. She continues diuretic regimen because she has  recurrent edema if she stops it. Unfortunately she had relapse with her alcohol consumption. She states it was only once. I have great concerns because she asked me if this okay if she takes "Jell-O shots". Discussed that she needs to avoid all alcohol forever.  She also has a history of anemia, rectal bleeding. Due for colonoscopy at this time. Plan on deep sedation in the OR. I have discussed the risks, alternatives, benefits with regards to but not limited to the risk of reaction to medication, bleeding, infection, perforation and the patient is agreeable to proceed. Written consent to be obtained. Start Linzess 290 g daily several days before procedure  Chronic constipation, resume lactulose as before to have 2-3 soft stools daily. No evidence of recent hepatic encephalopathy although she states she "sleeps all the time".  Abdominal wall boil, doxycycline RX. If no  improvement she may require lancing. She will follow up with her PCP.  Due for follow-up labs, meld score, check hepatitis a and B immune status. She will call [facility] liver transplant center and reschedule canceled office visit.  Laureen Ochs. Bernarda Caffey [facility] [phone] 4/7/201712:01 PM      her questioning regarding certain alcoholic beverages and whether or not she could have it.

## 2015-06-04 NOTE — Patient Instructions (Addendum)
1. Please have your labs done.  2. Colonoscopy as scheduled. See separate instructions.  3. Resume lactulose, titrate to 2-3 soft bowel movements daily. 4. Referral to the liver transplant center at [facility]. 5. Doxycycline 100mg  twice daily with food for 7 days.  6. NO ALCOHOL.

## 2015-06-07 NOTE — Progress Notes (Signed)
CC'ED TO PCP 

## 2015-06-22 NOTE — Patient Instructions (Signed)
TONDA PICCOLA  06/22/2015     @PREFPERIOPPHARMACY @   Your procedure is scheduled on  06/28/2015   Report to [facility] at  1245  P.M.  Call this number if you have problems the morning of surgery:  [phone]   Remember:  Do not eat food or drink liquids after midnight.  Take these medicines the morning of surgery with A SIP OF WATER  celexa   Do not wear jewelry, make-up or nail polish.  Do not wear lotions, powders, or perfumes.  You may wear deodorant.  Do not shave 48 hours prior to surgery.  Men may shave face and neck.  Do not bring valuables to the hospital.  [facility] is not responsible for any belongings or valuables.  Contacts, dentures or bridgework may not be worn into surgery.  Leave your suitcase in the car.  After surgery it may be brought to your room.  For patients admitted to the hospital, discharge time will be determined by your treatment team.  Patients discharged the day of surgery will not be allowed to drive home.   Name and phone number of your driver:   family Special instructions:  Follow the diet and prep instructions given to you by Dr Roseanne Kaufman office.  Please read over the following fact sheets that you were given. Coughing and Deep Breathing, Surgical Site Infection Prevention, Anesthesia Post-op Instructions and Care and Recovery After Surgery      Colonoscopy A colonoscopy is an exam to look at the entire large intestine (colon). This exam can help find problems such as tumors, polyps, inflammation, and areas of bleeding. The exam takes about 1 hour.  LET [facility] CARE PROVIDER KNOW ABOUT:   Any allergies you have.  All medicines you are taking, including vitamins, herbs, eye drops, creams, and over-the-counter medicines.  Previous problems you or members of your family have had with the use of anesthetics.  Any blood disorders you have.  Previous surgeries you have had.  Medical conditions you have. RISKS AND  COMPLICATIONS  Generally, this is a safe procedure. However, as with any procedure, complications can occur. Possible complications include:  Bleeding.  Tearing or rupture of the colon wall.  Reaction to medicines given during the exam.  Infection (rare). BEFORE THE PROCEDURE   Ask your health care provider about changing or stopping your regular medicines.  You may be prescribed an oral bowel prep. This involves drinking a large amount of medicated liquid, starting the day before your procedure. The liquid will cause you to have multiple loose stools until your stool is almost clear or light green. This cleans out your colon in preparation for the procedure.  Do not eat or drink anything else once you have started the bowel prep, unless your health care provider tells you it is safe to do so.  Arrange for someone to drive you home after the procedure. PROCEDURE   You will be given medicine to help you relax (sedative).  You will lie on your side with your knees bent.  A long, flexible tube with a light and camera on the end (colonoscope) will be inserted through the rectum and into the colon. The camera sends video back to a computer screen as it moves through the colon. The colonoscope also releases carbon dioxide gas to inflate the colon. This helps your health care provider see the area better.  During the exam, your health care provider may  take a small tissue sample (biopsy) to be examined under a microscope if any abnormalities are found.  The exam is finished when the entire colon has been viewed. AFTER THE PROCEDURE   Do not drive for 24 hours after the exam.  You may have a small amount of blood in your stool.  You may pass moderate amounts of gas and have mild abdominal cramping or bloating. This is caused by the gas used to inflate your colon during the exam.  Ask when your test results will be ready and how you will get your results. Make sure you get your test  results.   This information is not intended to replace advice given to you by your health care provider. Make sure you discuss any questions you have with your health care provider.   Document Released: 02/11/2000 Document Revised: 12/04/2012 Document Reviewed: 10/21/2012 Elsevier Interactive Patient Education 2016 [employer] Colonoscopy, Care After Refer to this sheet in the next few weeks. These instructions provide you with information on caring for yourself after your procedure. Your health care provider may also give you more specific instructions. Your treatment has been planned according to current medical practices, but problems sometimes occur. Call your health care provider if you have any problems or questions after your procedure. WHAT TO EXPECT AFTER THE PROCEDURE  After your procedure, it is typical to have the following:  A small amount of blood in your stool.  Moderate amounts of gas and mild abdominal cramping or bloating. HOME CARE INSTRUCTIONS  Do not drive, operate machinery, or sign important documents for 24 hours.  You may shower and resume your regular physical activities, but move at a slower pace for the first 24 hours.  Take frequent rest periods for the first 24 hours.  Walk around or put a warm pack on your abdomen to help reduce abdominal cramping and bloating.  Drink enough fluids to keep your urine clear or pale yellow.  You may resume your normal diet as instructed by your health care provider. Avoid heavy or fried foods that are hard to digest.  Avoid drinking alcohol for 24 hours or as instructed by your health care provider.  Only take over-the-counter or prescription medicines as directed by your health care provider.  If a tissue sample (biopsy) was taken during your procedure:  Do not take aspirin or blood thinners for 7 days, or as instructed by your health care provider.  Do not drink alcohol for 7 days, or as instructed by your health  care provider.  Eat soft foods for the first 24 hours. SEEK MEDICAL CARE IF: You have persistent spotting of blood in your stool 2-3 days after the procedure. SEEK IMMEDIATE MEDICAL CARE IF:  You have more than a small spotting of blood in your stool.  You pass large blood clots in your stool.  Your abdomen is swollen (distended).  You have nausea or vomiting.  You have a fever.  You have increasing abdominal pain that is not relieved with medicine.   This information is not intended to replace advice given to you by your health care provider. Make sure you discuss any questions you have with your health care provider.   Document Released: 09/28/2003 Document Revised: 12/04/2012 Document Reviewed: 10/21/2012 Elsevier Interactive Patient Education 2016 Elsevier Inc[employer] PATIENT INSTRUCTIONS POST-ANESTHESIA  IMMEDIATELY FOLLOWING SURGERY:  Do not drive or operate machinery for the first twenty four hours after surgery.  Do not make any important decisions for twenty four  hours after surgery or while taking narcotic pain medications or sedatives.  If you develop intractable nausea and vomiting or a severe headache please notify your doctor immediately.  FOLLOW-UP:  Please make an appointment with your surgeon as instructed. You do not need to follow up with anesthesia unless specifically instructed to do so.  WOUND CARE INSTRUCTIONS (if applicable):  Keep a dry clean dressing on the anesthesia/puncture wound site if there is drainage.  Once the wound has quit draining you may leave it open to air.  Generally you should leave the bandage intact for twenty four hours unless there is drainage.  If the epidural site drains for more than 36-48 hours please call the anesthesia department.  QUESTIONS?:  Please feel free to call your physician or the hospital operator if you have any questions, and they will be happy to assist you.

## 2015-06-23 ENCOUNTER — Encounter (HOSPITAL_COMMUNITY): Payer: Self-pay

## 2015-06-23 ENCOUNTER — Encounter (HOSPITAL_COMMUNITY)
Admission: RE | Admit: 2015-06-23 | Discharge: 2015-06-23 | Disposition: A | Discharge status: Home / Self Care | Payer: Medicaid Other | Source: Ambulatory Visit | Attending: Internal Medicine | Admitting: Internal Medicine

## 2015-06-23 DIAGNOSIS — Z01812 Encounter for preprocedural laboratory examination: Secondary | ICD-10-CM | POA: Diagnosis not present

## 2015-06-23 HISTORY — DX: Anemia, unspecified: D64.9

## 2015-06-23 HISTORY — DX: Alcoholic cirrhosis of liver without ascites: K70.30

## 2015-06-23 LAB — COMPREHENSIVE METABOLIC PANEL
ALT: 22 U/L (ref 14–54)
ANION GAP: 9 (ref 5–15)
AST: 45 U/L — AB (ref 15–41)
Albumin: 3 g/dL — ABNORMAL LOW (ref 3.5–5.0)
Alkaline Phosphatase: 60 U/L (ref 38–126)
BILIRUBIN TOTAL: 1.9 mg/dL — AB (ref 0.3–1.2)
BUN: 17 mg/dL (ref 6–20)
CHLORIDE: 96 mmol/L — AB (ref 101–111)
CO2: 28 mmol/L (ref 22–32)
Calcium: 9.4 mg/dL (ref 8.9–10.3)
Creatinine, Ser: 1.36 mg/dL — ABNORMAL HIGH (ref 0.44–1.00)
GFR, EST AFRICAN AMERICAN: 54 mL/min — AB (ref >60)
GFR, EST NON AFRICAN AMERICAN: 47 mL/min — AB (ref >60)
Glucose, Bld: 78 mg/dL (ref 65–99)
POTASSIUM: 4.1 mmol/L (ref 3.5–5.1)
Sodium: 133 mmol/L — ABNORMAL LOW (ref 135–145)
TOTAL PROTEIN: 7.8 g/dL (ref 6.5–8.1)

## 2015-06-23 LAB — CBC WITH DIFFERENTIAL/PLATELET
Basophils Absolute: 0 10*3/uL (ref 0.0–0.1)
Basophils Relative: 0 %
EOS PCT: 1 %
Eosinophils Absolute: 0.1 10*3/uL (ref 0.0–0.7)
HEMATOCRIT: 33.6 % — AB (ref 36.0–46.0)
Hemoglobin: 11.5 g/dL — ABNORMAL LOW (ref 12.0–15.0)
LYMPHS ABS: 2.8 10*3/uL (ref 0.7–4.0)
LYMPHS PCT: 25 %
MCH: 32.2 pg (ref 26.0–34.0)
MCHC: 34.2 g/dL (ref 30.0–36.0)
MCV: 94.1 fL (ref 78.0–100.0)
MONO ABS: 0.8 10*3/uL (ref 0.1–1.0)
MONOS PCT: 7 %
NEUTROS ABS: 7.5 10*3/uL (ref 1.7–7.7)
Neutrophils Relative %: 67 %
PLATELETS: 134 10*3/uL — AB (ref 150–400)
RBC: 3.57 MIL/uL — ABNORMAL LOW (ref 3.87–5.11)
RDW: 14.3 % (ref 11.5–15.5)
WBC: 11.2 10*3/uL — ABNORMAL HIGH (ref 4.0–10.5)

## 2015-06-23 LAB — PROTIME-INR
INR: 1.37 (ref 0.00–1.49)
PROTHROMBIN TIME: 17 s — AB (ref 11.6–15.2)

## 2015-06-23 LAB — HCG, SERUM, QUALITATIVE: PREG SERUM: NEGATIVE

## 2015-06-23 NOTE — Pre-Procedure Instructions (Signed)
Patient in for PAT. Noticed there were labs entered that were never completed on patient that had bee ordered by Neil Crouch, PA. Called Magda Paganini to see if these needed to be done prior to pt's TCS on Monday. Patient had failed to show for labs previously. So labs will be drawn now, per Neil Crouch PA.

## 2015-06-23 NOTE — Pre-Procedure Instructions (Signed)
Patient given information to sign up for my chart at home. 

## 2015-06-24 LAB — HEPATITIS B SURFACE ANTIBODY, QUANTITATIVE: Hep B S AB Quant (Post): >1000 m[IU]/mL (ref >9.9)

## 2015-06-24 LAB — HEPATITIS A ANTIBODY, TOTAL: HEP A TOTAL AB: POSITIVE — AB

## 2015-06-28 ENCOUNTER — Encounter (HOSPITAL_COMMUNITY)
Admission: RE | Disposition: A | Discharge status: Home / Self Care | Payer: Self-pay | Source: Ambulatory Visit | Attending: Internal Medicine

## 2015-06-28 ENCOUNTER — Ambulatory Visit (HOSPITAL_COMMUNITY): Payer: Medicaid Other | Admitting: Anesthesiology

## 2015-06-28 ENCOUNTER — Encounter (HOSPITAL_COMMUNITY): Payer: Self-pay | Admitting: *Deleted

## 2015-06-28 ENCOUNTER — Ambulatory Visit (HOSPITAL_COMMUNITY)
Admission: RE | Admit: 2015-06-28 | Discharge: 2015-06-28 | Disposition: A | Discharge status: Home / Self Care | Payer: Medicaid Other | Source: Ambulatory Visit | Attending: Internal Medicine | Admitting: Internal Medicine

## 2015-06-28 DIAGNOSIS — K7030 Alcoholic cirrhosis of liver without ascites: Secondary | ICD-10-CM | POA: Insufficient documentation

## 2015-06-28 DIAGNOSIS — K921 Melena: Secondary | ICD-10-CM | POA: Insufficient documentation

## 2015-06-28 DIAGNOSIS — K7010 Alcoholic hepatitis without ascites: Secondary | ICD-10-CM | POA: Diagnosis not present

## 2015-06-28 DIAGNOSIS — R601 Generalized edema: Secondary | ICD-10-CM | POA: Diagnosis not present

## 2015-06-28 DIAGNOSIS — Z87891 Personal history of nicotine dependence: Secondary | ICD-10-CM | POA: Diagnosis not present

## 2015-06-28 DIAGNOSIS — Z6833 Body mass index (BMI) 33.0-33.9, adult: Secondary | ICD-10-CM | POA: Insufficient documentation

## 2015-06-28 DIAGNOSIS — Z86010 Personal history of colonic polyps: Secondary | ICD-10-CM | POA: Insufficient documentation

## 2015-06-28 DIAGNOSIS — E669 Obesity, unspecified: Secondary | ICD-10-CM | POA: Diagnosis not present

## 2015-06-28 DIAGNOSIS — I10 Essential (primary) hypertension: Secondary | ICD-10-CM | POA: Diagnosis not present

## 2015-06-28 DIAGNOSIS — D123 Benign neoplasm of transverse colon: Secondary | ICD-10-CM | POA: Diagnosis not present

## 2015-06-28 DIAGNOSIS — D649 Anemia, unspecified: Secondary | ICD-10-CM | POA: Insufficient documentation

## 2015-06-28 DIAGNOSIS — Z860101 Personal history of adenomatous and serrated colon polyps: Secondary | ICD-10-CM | POA: Insufficient documentation

## 2015-06-28 DIAGNOSIS — F329 Major depressive disorder, single episode, unspecified: Secondary | ICD-10-CM | POA: Diagnosis not present

## 2015-06-28 DIAGNOSIS — Z538 Procedure and treatment not carried out for other reasons: Secondary | ICD-10-CM | POA: Diagnosis not present

## 2015-06-28 HISTORY — PX: POLYPECTOMY: SHX5525

## 2015-06-28 HISTORY — PX: COLONOSCOPY WITH PROPOFOL: SHX5780

## 2015-06-28 SURGERY — COLONOSCOPY WITH PROPOFOL
Anesthesia: Monitor Anesthesia Care

## 2015-06-28 MED ORDER — LACTATED RINGERS IV SOLN
INTRAVENOUS | Status: DC
  Administered 2015-06-28: 14:00:00 via INTRAVENOUS

## 2015-06-28 MED ORDER — MIDAZOLAM HCL 5 MG/5ML IJ SOLN
INTRAMUSCULAR | Status: DC | PRN
  Administered 2015-06-28: 2 mg via INTRAVENOUS

## 2015-06-28 MED ORDER — PROPOFOL 10 MG/ML IV BOLUS
INTRAVENOUS | Status: AC
  Filled 2015-06-28: qty 20

## 2015-06-28 MED ORDER — MIDAZOLAM HCL 2 MG/2ML IJ SOLN
1.0000 mg | INTRAMUSCULAR | Status: DC | PRN
Start: 2015-06-28 — End: 2015-06-28
  Administered 2015-06-28: 2 mg via INTRAVENOUS

## 2015-06-28 MED ORDER — PROPOFOL 10 MG/ML IV BOLUS
INTRAVENOUS | Status: DC | PRN
  Administered 2015-06-28: 10 mg via INTRAVENOUS

## 2015-06-28 MED ORDER — MIDAZOLAM HCL 2 MG/2ML IJ SOLN
INTRAMUSCULAR | Status: AC
  Filled 2015-06-28: qty 2

## 2015-06-28 MED ORDER — PROPOFOL 500 MG/50ML IV EMUL
INTRAVENOUS | Status: DC | PRN
  Administered 2015-06-28: 15:00:00 via INTRAVENOUS
  Administered 2015-06-28: 125 ug/kg/min via INTRAVENOUS
  Administered 2015-06-28: 15:00:00 via INTRAVENOUS

## 2015-06-28 MED ORDER — FLEET ENEMA 7-19 GM/118ML RE ENEM
1.0000 | ENEMA | Freq: Once | RECTAL | Status: AC
  Administered 2015-06-28: 1 via RECTAL
  Filled 2015-06-28: qty 1

## 2015-06-28 MED ORDER — FENTANYL CITRATE (PF) 100 MCG/2ML IJ SOLN: 25.0000 ug | INTRAMUSCULAR | Status: DC | PRN

## 2015-06-28 MED ORDER — ONDANSETRON HCL 4 MG/2ML IJ SOLN: 4.0000 mg | Freq: Once | INTRAMUSCULAR | Status: DC | PRN

## 2015-06-28 NOTE — Interval H&P Note (Signed)
History and Physical Interval Note:  06/28/2015 2:37 PM  Whitney Bush  has presented today for surgery, with the diagnosis of rectal bleeding, anemia  The various methods of treatment have been discussed with the patient and family. After consideration of risks, benefits and other options for treatment, the patient has consented to  Procedure(s) with comments: COLONOSCOPY WITH PROPOFOL (N/A) - 1415 as a surgical intervention .  The patient's history has been reviewed, patient examined, no change in status, stable for surgery.  I have reviewed the patient's chart and labs.  Questions were answered to the patient's satisfaction.     Zane Pellecchia  No change. Diagnostic colonoscopy per plan.  The risks, benefits, limitations, alternatives and imponderables have been reviewed with the patient. Questions have been answered. All parties are agreeable.

## 2015-06-28 NOTE — Transfer of Care (Signed)
Immediate Anesthesia Transfer of Care Note  Patient: Whitney Bush  Procedure(s) Performed: Procedure(s) with comments: COLONOSCOPY WITH PROPOFOL (N/A) - 1415 POLYPECTOMY - at splenic flexure  Patient Location: PACU  Anesthesia Type:MAC  Level of Consciousness: awake and patient cooperative  Airway & Oxygen Therapy: Patient Spontanous Breathing and Patient connected to face mask oxygen  Post-op Assessment: Report given to RN, Post -op Vital signs reviewed and stable and Patient moving all extremities  Post vital signs: Reviewed and stable  Last Vitals:  Filed Vitals:   06/28/15 1420 06/28/15 1425  BP: 99/62 103/63  Pulse:    Temp:    Resp: 17 17    Last Pain:  Filed Vitals:   06/28/15 1446  PainSc: 6       Patients Stated Pain Goal: 6 ([id number])  Complications: No apparent anesthesia complications

## 2015-06-28 NOTE — Addendum Note (Signed)
Addendum  created 06/28/15 1525 by Rusty Aus, MD   Modules edited: Anesthesia Attestations

## 2015-06-28 NOTE — Discharge Instructions (Signed)
°  Colonoscopy Discharge Instructions  Read the instructions outlined below and refer to this sheet in the next few weeks. These discharge instructions provide you with general information on caring for yourself after you leave the hospital. Your doctor may also give you specific instructions. While your treatment has been planned according to the most current medical practices available, unavoidable complications occasionally occur. If you have any problems or questions after discharge, call Dr. Gala Romney at [phone]. ACTIVITY  You may resume your regular activity, but move at a slower pace for the next 24 hours.   Take frequent rest periods for the next 24 hours.   Walking will help get rid of the air and reduce the bloated feeling in your belly (abdomen).   No driving for 24 hours (because of the medicine (anesthesia) used during the test).    Do not sign any important legal documents or operate any machinery for 24 hours (because of the anesthesia used during the test).  NUTRITION  Drink plenty of fluids.   You may resume your normal diet as instructed by your doctor.   Begin with a light meal and progress to your normal diet. Heavy or fried foods are harder to digest and may make you feel sick to your stomach (nauseated).   Avoid alcoholic beverages for 24 hours or as instructed.  MEDICATIONS  You may resume your normal medications unless your doctor tells you otherwise.  WHAT YOU CAN EXPECT TODAY  Some feelings of bloating in the abdomen.   Passage of more gas than usual.   Spotting of blood in your stool or on the toilet paper.  IF YOU HAD POLYPS REMOVED DURING THE COLONOSCOPY:  No aspirin products for 7 days or as instructed.   No alcohol for 7 days or as instructed.   Eat a soft diet for the next 24 hours.  FINDING OUT THE RESULTS OF YOUR TEST Not all test results are available during your visit. If your test results are not back during the visit, make an appointment  with your caregiver to find out the results. Do not assume everything is normal if you have not heard from your caregiver or the medical facility. It is important for you to follow up on all of your test results.  SEEK IMMEDIATE MEDICAL ATTENTION IF:  You have more than a spotting of blood in your stool.   Your belly is swollen (abdominal distention).   You are nauseated or vomiting.   You have a temperature over 101.   You have abdominal pain or discomfort that is severe or gets worse throughout the day.    Your colonoscopy preparation was unsatisfactory today  You did have a polyp which was removed.  Further recommendations to follow pending review of pathology report  No MRI in the future until clip gone

## 2015-06-28 NOTE — Anesthesia Postprocedure Evaluation (Signed)
Anesthesia Post Note  Patient: Whitney Bush  Procedure(s) Performed: Procedure(s) (LRB): COLONOSCOPY WITH PROPOFOL (N/A) POLYPECTOMY  Patient location during evaluation: PACU Anesthesia Type: MAC Level of consciousness: awake and alert, oriented and patient cooperative Pain management: pain level controlled Vital Signs Assessment: post-procedure vital signs reviewed and stable Respiratory status: nonlabored ventilation, spontaneous breathing and respiratory function stable Cardiovascular status: blood pressure returned to baseline Postop Assessment: no signs of nausea or vomiting Anesthetic complications: no    Last Vitals:  Filed Vitals:   06/28/15 1420 06/28/15 1425  BP: 99/62 103/63  Pulse:    Temp:    Resp: 17 17    Last Pain:  Filed Vitals:   06/28/15 1446  PainSc: 6                  [name] J

## 2015-06-28 NOTE — Op Note (Signed)
[facility] Patient Name: Whitney Bush Procedure Date: 06/28/2015 2:45 PM MRN: [id number] Date of Birth: February 04, 1971 Attending MD: Norvel Richards , MD CSN: [id number] Age: 45 Admit Type: Outpatient Procedure:                Colonoscopy with snare polypectomy and hemostasis                            clip placement Indications:              Hematochezia; anemia Providers:                Norvel Richards, MD, Gwenlyn Fudge, RN, Isabella Stalling, Technician Referring MD:              Medicines:                Monitored Anesthesia Care Complications:            No immediate complications. Estimated Blood Loss:     Estimated blood loss was minimal. Procedure:                Pre-Anesthesia Assessment:                           - Prior to the procedure, a History and Physical                            was performed, and patient medications and                            allergies were reviewed. The patient's tolerance of                            previous anesthesia was also reviewed. The risks                            and benefits of the procedure and the sedation                            options and risks were discussed with the patient.                            All questions were answered, and informed consent                            was obtained. Prior Anticoagulants: The patient has                            taken no previous anticoagulant or antiplatelet                            agents. ASA Grade Assessment: II - A patient with  mild systemic disease. After reviewing the risks                            and benefits, the patient was deemed in                            satisfactory condition to undergo the procedure.                           After obtaining informed consent, the colonoscope                            was passed under direct vision. Throughout the                            procedure, the  patient's blood pressure, pulse, and                            oxygen saturations were monitored continuously. The                            [id number] scope was introduced through                            the anus and advanced to the the cecum, identified                            by appendiceal orifice and ileocecal valve. The                            colonoscopy was performed without difficulty. The                            entire colon was examined. The ileocecal valve,                            appendiceal orifice, and rectum were photographed.                            The colonoscopy was performed without difficulty.                            The quality of the bowel preparation was inadequate. Scope In: 2:53:19 PM Scope Out: 3:12:44 PM Scope Withdrawal Time: 0 hours 9 minutes 29 seconds  Total Procedure Duration: 0 hours 19 minutes 25 seconds  Findings:      Colonoscopy preparation was inadequate. Patient's ingestion of at least       a [place] sandwich yesterday was in conflict with our written and verbal       prep instructions.      The perianal and digital rectal examinations were normal.      A 6 mm polyp was found in the splenic flexure. The polyp was       semi-pedunculated. The polyp was removed with a cold snare. Resection  and retrieval were complete. There was some oozing at the polypectomy       site for which a resolution clip was placed Impression:               - Preparation of the colon was inadequate.                           - One 6 mm polyp at the splenic flexure, removed                            with a cold snare. Resected and retrieved.                            hemostasis clip placed Moderate Sedation:      Moderate (conscious) sedation was personally administered by an       anesthesia professional. The following parameters were monitored: oxygen       saturation, heart rate, blood pressure, respiratory rate, EKG, adequacy        of pulmonary ventilation, and response to care. Total physician       intraservice time was 28 minutes. Recommendation:           - Patient has a contact number available for                            emergencies. The signs and symptoms of potential                            delayed complications were discussed with the                            patient. Return to normal activities tomorrow.                            Written discharge instructions were provided to the                            patient.                           - Advance diet as tolerated.                           - Continue present medications. No future MRI until                            clips gone. Patient will need early an interval                            follow-up colonoscopy given inadequate preparation                            today                           - Await pathology results.                           -  Repeat colonoscopy date to be determined after                            pending pathology results are reviewed for                            surveillance based on pathology results.                           - Await pathology results. Procedure Code(s):        --- Professional ---                           [id number], Colonoscopy, flexible; with removal of                            tumor(s), polyp(s), or other lesion(s) by snare                            technique Diagnosis Code(s):        --- Professional ---                           D12.3, Benign neoplasm of transverse colon (hepatic                            flexure or splenic flexure)                           K92.1, Melena (includes Hematochezia) CPT copyright 2016 American Medical Association. All rights reserved. The codes documented in this report are preliminary and upon coder review may  be revised to meet current compliance requirements. Cristopher Estimable. Rourk, MD Norvel Richards, MD 06/28/2015 3:22:26 PM This report has been  signed electronically. Number of Addenda: 0

## 2015-06-28 NOTE — H&P (View-Only) (Signed)
Primary Care Physician: Shade Flood, MD  Primary Gastroenterologist:  Garfield Cornea, MD   Chief Complaint  Patient presents with  . Follow-up    HPI: Whitney Bush is a 45 y.o. female here for three-month follow-up. She has a history of cirrhosis due to alcohol, alcoholic hepatitis, transfusion dependent anemia, anasarca. At her maximum weight with anasarca she was 280 pounds. When I saw her 3 months ago she was 198. Today she is stable.  As far as cirrhosis care, she has had an EGD done. She had esophageal varices, 2 columns grade 2. Moderate gastropathy, mild erosive gastritis, reactive gastropathy on pathology but no H. pylori. Plans for repeat EGD in 1 year. She had to cancel Her appointment at [facility] transplant center last month because her Medicaid was still pending. She plans to call and reschedule.  Patient complains of boil on lower abdomen present for 2-3 weeks. No drainage. No feverl. it is painful. Some mild LE edema. Spots of blood in stool. Stool is hard. Ran out of lactulose. Utilizing stool softeners but they are not helping. Complains of headaches. No abdominal pain. Appetite is okay. She had a relapse and consumes 2-3 glasses of wine at a party within the past week. She states she had been doing well up until that time with her sobriety. She asked me several times regarding certain alcoholic beverages whether it would be okay. She asked if she could do "Jell-O shots". She asked about "rum cake".    Current Outpatient Prescriptions  Medication Sig Dispense Refill  . B Complex Vitamins (VITAMIN B-COMPLEX PO) Take 1 tablet by mouth daily.    . citalopram (CELEXA) 20 MG tablet Take 20 mg by mouth daily.    . folic acid (FOLVITE) 1 MG tablet Take 1 tablet (1 mg total) by mouth daily.    . furosemide (LASIX) 40 MG tablet Take 1 tablet (40 mg total) by mouth 2 (two) times daily. 60 tablet 2  . lactulose (CHRONULAC) 10 GM/15ML solution Take 15 mLs (10 g total) by  mouth daily. 473 mL 0  . Multiple Vitamin (MULTIVITAMIN WITH MINERALS) TABS tablet Take 1 tablet by mouth daily.    Marland Kitchen spironolactone (ALDACTONE) 100 MG tablet Take 1 tablet (100 mg total) by mouth 2 (two) times daily. 60 tablet 2  . vitamin E 400 UNIT capsule Take 400 Units by mouth daily.     No current facility-administered medications for this visit.    Allergies as of 06/04/2015  . (No Known Allergies)   Past Medical History  Diagnosis Date  . Hypertension   . Depression   . Migraines   . Peripheral edema   . Alcohol abuse    Past Surgical History  Procedure Laterality Date  . Abcess drainage    . Cesarean section    . Esophagogastroduodenoscopy (egd) with propofol N/A 02/24/2015    SLF: Grade II esophageal varices Moderate portal hypertensive gastropathy Mild erosive gastritis anemia likely due to many factors: gastritis, gastropathy, coagulopathy, chronic disease  . Biopsy  02/24/2015    Procedure: BIOPSY;  Surgeon: Danie Binder, MD;  Location: [facility];  Service: Endoscopy;;  gastric bx's   Family History  Problem Relation Age of Onset  . Diabetes Father   . Cancer Other   . Cervical cancer Maternal Grandmother   . Lung cancer Maternal Grandfather   . Alcohol abuse Other     multiple family members  . Colon cancer Neg Hx   .  Liver disease Neg Hx    Social History   Social History  . Marital Status: Single    Spouse Name: N/A  . Number of Children: 1  . Years of Education: N/A   Occupational History  . unemployed    Social History Main Topics  . Smoking status: Former Smoker -- 1.00 packs/day for 5 years    Types: Cigarettes    Quit date: 12/29/2010  . Smokeless tobacco: Former Systems developer    Types: Snuff  . Alcohol Use: 2.4 oz/week    4 Glasses of wine per week     Comment: occassional, vodka historically as well. Patient states she quit 12/2014 but relapse 04/2015   . Drug Use: No  . Sexual Activity: Yes    Birth Control/ Protection: None   Other  Topics Concern  . None   Social History Narrative    ROS:  General: Negative for anorexia, weight loss, fever, chills, fatigue, weakness. ENT: Negative for hoarseness, difficulty swallowing , nasal congestion. CV: Negative for chest pain, angina, palpitations, dyspnea on exertion, peripheral edema.  Respiratory: Negative for dyspnea at rest, dyspnea on exertion, cough, sputum, wheezing.  GI: See history of present illness. GU:  Negative for dysuria, hematuria, urinary incontinence, urinary frequency, nocturnal urination.  Endo: Negative for unusual weight change.    Physical Examination:   BP 127/78 mmHg  Pulse 89  Temp(Src) 98 F (36.7 C) (Oral)  Ht 5\' 3"  (1.6 m)  Wt 189 lb 3.2 oz (85.821 kg)  BMI 33.52 kg/m2  LMP 06/01/2015  General: Well-nourished, well-developed in no acute distress.  Eyes: No icterus. Mouth: Oropharyngeal mucosa moist and pink , no lesions erythema or exudate. Lungs: Clear to auscultation bilaterally.  Heart: Regular rate and rhythm, no murmurs rubs or gallops.  Abdomen: Bowel sounds are normal, nontender, nondistended, no hepatosplenomegaly or masses, no abdominal bruits or hernia , no rebound or guarding.  3 cm round indurated tender lesion in the right lower abdomen without any evidence of drainage. Extremities: No lower extremity edema. No clubbing or deformities. Neuro: Alert and oriented x 4   Skin: Warm and dry, no jaundice.   Psych: Alert and cooperative, normal mood and affect.  Labs:  Patient did not have her labs done in January due to lack of insurance. Imaging Studies: No results found.   Impression/plan: 45 year old female with history of alcoholic cirrhosis, previously with significant anasarca who presents for follow-up. She has done well with regards to the anasarca. She dropped a tremendous amount of weight since her hospitalizations. Her weight has been stable now for 2 months. She continues diuretic regimen because she has  recurrent edema if she stops it. Unfortunately she had relapse with her alcohol consumption. She states it was only once. I have great concerns because she asked me if this okay if she takes "Jell-O shots". Discussed that she needs to avoid all alcohol forever.  She also has a history of anemia, rectal bleeding. Due for colonoscopy at this time. Plan on deep sedation in the OR. I have discussed the risks, alternatives, benefits with regards to but not limited to the risk of reaction to medication, bleeding, infection, perforation and the patient is agreeable to proceed. Written consent to be obtained. Start Linzess 290 g daily several days before procedure  Chronic constipation, resume lactulose as before to have 2-3 soft stools daily. No evidence of recent hepatic encephalopathy although she states she "sleeps all the time".  Abdominal wall boil, doxycycline RX. If no  improvement she may require lancing. She will follow up with her PCP.  Due for follow-up labs, meld score, check hepatitis a and B immune status. She will call [facility] liver transplant center and reschedule canceled office visit.  Laureen Ochs. Bernarda Caffey [facility] [phone] 4/7/201712:01 PM      her questioning regarding certain alcoholic beverages and whether or not she could have it.

## 2015-06-28 NOTE — Anesthesia Preprocedure Evaluation (Signed)
Anesthesia Evaluation  Patient identified by MRN, date of birth, ID band Patient awake    Reviewed: Allergy & Precautions, NPO status , Patient's Chart, lab work & pertinent test results  Airway Mallampati: III  TM Distance: >3 FB Neck ROM: Full    Dental  (+) Teeth Intact,    Pulmonary shortness of breath and with exertion, pneumonia, resolved, former smoker,    Pulmonary exam normal        Cardiovascular hypertension, Pt. on medications Normal cardiovascular exam     Neuro/Psych  Headaches, PSYCHIATRIC DISORDERS Depression    GI/Hepatic (+) Cirrhosis   ascites  substance abuse  alcohol use,   Endo/Other    Renal/GU Renal InsufficiencyRenal disease     Musculoskeletal   Abdominal (+) + obese,   Peds  Hematology  (+) anemia ,   Anesthesia Other Findings   Reproductive/Obstetrics                             Anesthesia Physical Anesthesia Plan  ASA: IV  Anesthesia Plan: MAC   Post-op Pain Management:    Induction: Intravenous  Airway Management Planned: Nasal Cannula  Additional Equipment:   Intra-op Plan:   Post-operative Plan:   Informed Consent: I have reviewed the patients History and Physical, chart, labs and discussed the procedure including the risks, benefits and alternatives for the proposed anesthesia with the patient or authorized representative who has indicated his/her understanding and acceptance.   Dental advisory given  Plan Discussed with: CRNA  Anesthesia Plan Comments:         Anesthesia Quick Evaluation

## 2015-06-29 NOTE — Progress Notes (Signed)
Quick Note:  MELD 19. Immune to Hep A and B. Due for abd u/s for [facility] screening. Recommend repeat PT/INR, CMET in 6 weeks. No etoh. ______

## 2015-06-30 ENCOUNTER — Encounter: Payer: Self-pay | Admitting: Internal Medicine

## 2015-07-02 ENCOUNTER — Other Ambulatory Visit: Payer: Self-pay | Admitting: Gastroenterology

## 2015-07-02 ENCOUNTER — Encounter (HOSPITAL_COMMUNITY): Payer: Self-pay | Admitting: Internal Medicine

## 2015-07-02 DIAGNOSIS — K7031 Alcoholic cirrhosis of liver with ascites: Secondary | ICD-10-CM

## 2015-07-16 ENCOUNTER — Other Ambulatory Visit: Payer: Self-pay

## 2015-07-16 DIAGNOSIS — K7031 Alcoholic cirrhosis of liver with ascites: Secondary | ICD-10-CM

## 2015-07-27 ENCOUNTER — Inpatient Hospital Stay (HOSPITAL_COMMUNITY)
Admission: EM | Admit: 2015-07-27 | Discharge: 2015-07-29 | DRG: 378 | Disposition: A | Discharge status: Home / Self Care | Payer: Medicaid Other | Attending: Internal Medicine | Admitting: Internal Medicine

## 2015-07-27 ENCOUNTER — Encounter (HOSPITAL_COMMUNITY): Payer: Self-pay | Admitting: Emergency Medicine

## 2015-07-27 ENCOUNTER — Emergency Department (HOSPITAL_COMMUNITY): Payer: Medicaid Other

## 2015-07-27 DIAGNOSIS — Z87891 Personal history of nicotine dependence: Secondary | ICD-10-CM

## 2015-07-27 DIAGNOSIS — K7030 Alcoholic cirrhosis of liver without ascites: Secondary | ICD-10-CM | POA: Diagnosis present

## 2015-07-27 DIAGNOSIS — K922 Gastrointestinal hemorrhage, unspecified: Principal | ICD-10-CM | POA: Diagnosis present

## 2015-07-27 DIAGNOSIS — L02211 Cutaneous abscess of abdominal wall: Secondary | ICD-10-CM | POA: Diagnosis present

## 2015-07-27 DIAGNOSIS — D6959 Other secondary thrombocytopenia: Secondary | ICD-10-CM | POA: Diagnosis present

## 2015-07-27 DIAGNOSIS — M79672 Pain in left foot: Secondary | ICD-10-CM | POA: Diagnosis present

## 2015-07-27 DIAGNOSIS — L0292 Furuncle, unspecified: Secondary | ICD-10-CM

## 2015-07-27 DIAGNOSIS — R600 Localized edema: Secondary | ICD-10-CM

## 2015-07-27 DIAGNOSIS — K7031 Alcoholic cirrhosis of liver with ascites: Secondary | ICD-10-CM

## 2015-07-27 DIAGNOSIS — M79671 Pain in right foot: Secondary | ICD-10-CM | POA: Diagnosis present

## 2015-07-27 DIAGNOSIS — F1010 Alcohol abuse, uncomplicated: Secondary | ICD-10-CM | POA: Diagnosis not present

## 2015-07-27 DIAGNOSIS — N179 Acute kidney failure, unspecified: Secondary | ICD-10-CM | POA: Diagnosis present

## 2015-07-27 DIAGNOSIS — F1020 Alcohol dependence, uncomplicated: Secondary | ICD-10-CM | POA: Diagnosis present

## 2015-07-27 DIAGNOSIS — Z8049 Family history of malignant neoplasm of other genital organs: Secondary | ICD-10-CM

## 2015-07-27 DIAGNOSIS — Z801 Family history of malignant neoplasm of trachea, bronchus and lung: Secondary | ICD-10-CM

## 2015-07-27 DIAGNOSIS — R195 Other fecal abnormalities: Secondary | ICD-10-CM

## 2015-07-27 DIAGNOSIS — I10 Essential (primary) hypertension: Secondary | ICD-10-CM | POA: Diagnosis present

## 2015-07-27 DIAGNOSIS — Z833 Family history of diabetes mellitus: Secondary | ICD-10-CM

## 2015-07-27 DIAGNOSIS — T39395A Adverse effect of other nonsteroidal anti-inflammatory drugs [NSAID], initial encounter: Secondary | ICD-10-CM | POA: Diagnosis present

## 2015-07-27 LAB — URINALYSIS, ROUTINE W REFLEX MICROSCOPIC
BILIRUBIN URINE: NEGATIVE
GLUCOSE, UA: NEGATIVE mg/dL
HGB URINE DIPSTICK: NEGATIVE
Ketones, ur: NEGATIVE mg/dL
Leukocytes, UA: NEGATIVE
NITRITE: NEGATIVE
PH: 5.5 (ref 5.0–8.0)
Protein, ur: NEGATIVE mg/dL
SPECIFIC GRAVITY, URINE: 1.01 (ref 1.005–1.030)

## 2015-07-27 LAB — CBC WITH DIFFERENTIAL/PLATELET
BASOS ABS: 0 10*3/uL (ref 0.0–0.1)
Basophils Relative: 0 %
EOS ABS: 0.2 10*3/uL (ref 0.0–0.7)
EOS PCT: 1 %
HCT: 32.5 % — ABNORMAL LOW (ref 36.0–46.0)
HEMOGLOBIN: 11.3 g/dL — AB (ref 12.0–15.0)
LYMPHS PCT: 26 %
Lymphs Abs: 3.5 10*3/uL (ref 0.7–4.0)
MCH: 31.7 pg (ref 26.0–34.0)
MCHC: 34.8 g/dL (ref 30.0–36.0)
MCV: 91 fL (ref 78.0–100.0)
Monocytes Absolute: 1.3 10*3/uL — ABNORMAL HIGH (ref 0.1–1.0)
Monocytes Relative: 9 %
NEUTROS PCT: 64 %
Neutro Abs: 8.8 10*3/uL — ABNORMAL HIGH (ref 1.7–7.7)
PLATELETS: 134 10*3/uL — AB (ref 150–400)
RBC: 3.57 MIL/uL — AB (ref 3.87–5.11)
RDW: 13.5 % (ref 11.5–15.5)
WBC: 13.8 10*3/uL — AB (ref 4.0–10.5)

## 2015-07-27 LAB — COMPREHENSIVE METABOLIC PANEL
ALBUMIN: 3.1 g/dL — AB (ref 3.5–5.0)
ALK PHOS: 61 U/L (ref 38–126)
ALT: 23 U/L (ref 14–54)
AST: 42 U/L — AB (ref 15–41)
Anion gap: 7 (ref 5–15)
BUN: 43 mg/dL — AB (ref 6–20)
CHLORIDE: 100 mmol/L — AB (ref 101–111)
CO2: 26 mmol/L (ref 22–32)
CREATININE: 2.45 mg/dL — AB (ref 0.44–1.00)
Calcium: 9.6 mg/dL (ref 8.9–10.3)
GFR calc Af Amer: 27 mL/min — ABNORMAL LOW (ref >60)
GFR calc non Af Amer: 23 mL/min — ABNORMAL LOW (ref >60)
GLUCOSE: 83 mg/dL (ref 65–99)
Potassium: 4.1 mmol/L (ref 3.5–5.1)
SODIUM: 133 mmol/L — AB (ref 135–145)
Total Bilirubin: 1.7 mg/dL — ABNORMAL HIGH (ref 0.3–1.2)
Total Protein: 7.9 g/dL (ref 6.5–8.1)

## 2015-07-27 LAB — PROTIME-INR
INR: 1.36 (ref 0.00–1.49)
Prothrombin Time: 16.9 seconds — ABNORMAL HIGH (ref 11.6–15.2)

## 2015-07-27 LAB — APTT: aPTT: 41 seconds — ABNORMAL HIGH (ref 24–37)

## 2015-07-27 LAB — BRAIN NATRIURETIC PEPTIDE: B Natriuretic Peptide: 29 pg/mL (ref 0.0–100.0)

## 2015-07-27 LAB — POC OCCULT BLOOD, ED: FECAL OCCULT BLD: POSITIVE — AB

## 2015-07-27 MED ORDER — LIDOCAINE-EPINEPHRINE (PF) 2 %-1:200000 IJ SOLN
20.0000 mL | Freq: Once | INTRAMUSCULAR | Status: AC
  Administered 2015-07-27: 20 mL
  Filled 2015-07-27: qty 20

## 2015-07-27 MED ORDER — POVIDONE-IODINE 10 % EX SOLN
CUTANEOUS | Status: AC
  Filled 2015-07-27: qty 118

## 2015-07-27 NOTE — ED Notes (Signed)
Attempted x 2 for iv access without success, CN Bel[place], RN in room to assess.  Reported that pt will be admitted

## 2015-07-27 NOTE — ED Notes (Signed)
Patient states that her feet are throbbing. Having pain in lower back and abdominal pain also.

## 2015-07-27 NOTE — ED Provider Notes (Addendum)
CSN: [id number]     Arrival date & time 07/27/15  1429 History   First MD Initiated Contact with Patient 07/27/15 1817     Chief Complaint  Patient presents with  . Leg Swelling     (Consider location/radiation/quality/duration/timing/severity/associated sxs/prior Treatment) HPI 45 year old female who presents with lower extremity edema. She has a history of alcoholic cirrhosis complicated by prior chronic anemia. States that over the past month she has had increasing lower extremity edema abdominal distention. Has been compliant with her Lasix, but states that she has been urinating less. No significant abdominal pain but reports recurrent boil in her right lower abdomen which she was recently treated for the course of antibiotics one month ago. States that her current weight is about 200-205 pounds. Her dry weight in the office of her GI doctor who was noted to be around 198 pounds. Notes that she has had some shortness of breath with activity and some difficulty lying flat due to shortness of breath and abdominal distention. No nausea, vomiting, diarrhea, melena or hematochezia although does note that her stool appears darker in color. No fevers or chills. No cough or chest pain. Past Medical History  Diagnosis Date  . Hypertension   . Depression   . Migraines   . Peripheral edema   . Alcohol abuse   . Alcoholic cirrhosis of liver ([place])   . Anemia    Past Surgical History  Procedure Laterality Date  . Abcess drainage      x5; neck, arm, chest, back  . Cesarean section    . Esophagogastroduodenoscopy (egd) with propofol N/A 02/24/2015    SLF: Grade II esophageal varices Moderate portal hypertensive gastropathy Mild erosive gastritis anemia likely due to many factors: gastritis, gastropathy, coagulopathy, chronic disease  . Biopsy  02/24/2015    Procedure: BIOPSY;  Surgeon: Danie Binder, MD;  [facility];  Service: Endoscopy;;  gastric bx's  . Colonoscopy with propofol  N/A 06/28/2015    Procedure: COLONOSCOPY WITH PROPOFOL;  Surgeon: Daneil Dolin, MD;  [facility];  Service: Endoscopy;  Laterality: N/A;  1415  . Polypectomy  06/28/2015    Procedure: POLYPECTOMY;  Surgeon: Daneil Dolin, MD;  Location: [facility];  Service: Endoscopy;;  at splenic flexure   Family History  Problem Relation Age of Onset  . Diabetes Father   . Cancer Other   . Cervical cancer Maternal Grandmother   . Lung cancer Maternal Grandfather   . Alcohol abuse Other     multiple family members  . Colon cancer Neg Hx   . Liver disease Neg Hx    Social History  Substance Use Topics  . Smoking status: Former Smoker -- 1.00 packs/day for 5 years    Types: Cigarettes    Quit date: 12/29/2010  . Smokeless tobacco: Former Systems developer    Types: Snuff  . Alcohol Use: 2.4 oz/week    4 Glasses of wine per week     Comment: occassional, vodka historically as well. Patient states she quit 12/2014 but relapse 04/2015; pt sts none since 04/2015 on 06/23/2015.   OB History    Gravida Para Term Preterm AB TAB SAB Ectopic Multiple Living   2 1 1  1     1      Review of Systems 10/14 systems reviewed and are negative other than those stated in the HPI   Allergies  Review of patient's allergies indicates no known allergies.  Home Medications   Prior  to Admission medications   Medication Sig Start Date End Date Taking? Authorizing Provider  B Complex Vitamins (VITAMIN B-COMPLEX PO) Take 1 tablet by mouth daily.   Yes Historical Provider, MD  citalopram (CELEXA) 20 MG tablet Take 20 mg by mouth daily.   Yes Historical Provider, MD  folic acid (FOLVITE) 1 MG tablet Take 1 tablet (1 mg total) by mouth daily. 12/05/14  Yes Estela Leonie Green, MD  furosemide (LASIX) 40 MG tablet Take 1 tablet (40 mg total) by mouth 2 (two) times daily. 03/22/15  Yes Mahala Menghini, PA-C  lactulose (CHRONULAC) 10 GM/15ML solution Take 15 mLs (10 g total) by mouth 2 (two) times daily. 06/04/15  Yes  Mahala Menghini, PA-C  Multiple Vitamin (MULTIVITAMIN WITH MINERALS) TABS tablet Take 1 tablet by mouth daily. 12/05/14  Yes Estela Leonie Green, MD  spironolactone (ALDACTONE) 100 MG tablet Take 1 tablet (100 mg total) by mouth 2 (two) times daily. 03/22/15  Yes Mahala Menghini, PA-C  vitamin E 400 UNIT capsule Take 400 Units by mouth daily.   Yes Historical Provider, MD  doxycycline (VIBRAMYCIN) 100 MG capsule Take 1 capsule (100 mg total) by mouth 2 (two) times daily with a meal. Patient not taking: Reported on 06/15/2015 06/04/15   Mahala Menghini, PA-C  polyethylene glycol-electrolytes (NULYTELY/GOLYTELY) 420 g solution Take 4,000 mLs by mouth once. Patient not taking: Reported on 07/27/2015 06/04/15   Mahala Menghini, PA-C   BP 118/74 mmHg  Pulse 88  Temp(Src) 98.8 F (37.1 C) (Oral)  Resp 18  Ht 5\' 3"  (1.6 m)  Wt 199 lb (90.266 kg)  BMI 35.26 kg/m2  SpO2 100%  LMP 07/18/2015 Physical Exam Physical Exam  Nursing note and vitals reviewed. Constitutional: Well developed, well nourished, non-toxic, and in no acute distress Head: Normocephalic and atraumatic.  Mouth/Throat: Oropharynx is clear and moist.  Neck: Normal range of motion. Neck supple.  Cardiovascular: Normal rate and regular rhythm.  Pedal edema bilaterally Pulmonary/Chest: Effort normal and breath sounds normal.  Abdominal: Soft. Moderate distension. There is no tenderness. There is no rebound and no guarding.  3x 2 cm abscess over anterior abdominal wall Musculoskeletal: Normal range of motion.  Neurological: Alert, no facial droop, fluent speech, moves all extremities symmetrically Skin: Skin is warm and dry.  Psychiatric: Cooperative  ED Course  .Marland KitchenIncision and Drainage Date/Time: 07/27/2015 11:32 PM Performed by: Brantley Stage DUO Authorized by: Brantley Stage DUO Consent: Verbal consent obtained. Risks and benefits: risks, benefits and alternatives were discussed Consent given by: patient Type: abscess Body area:  trunk Location details: abdomen Anesthesia: local infiltration Local anesthetic: lidocaine 2% with epinephrine Anesthetic total: 2 ml Patient sedated: no Scalpel size: 11 Incision type: single straight Incision depth: dermal Drainage: bloody and  purulent Drainage amount: moderate Wound treatment: wound left open Packing material: none Patient tolerance: Patient tolerated the procedure well with no immediate complications   (including critical care time) Labs Review Labs Reviewed  CBC WITH DIFFERENTIAL/PLATELET - Abnormal; Notable for the following:    WBC 13.8 (*)    RBC 3.57 (*)    Hemoglobin 11.3 (*)    HCT 32.5 (*)    Platelets 134 (*)    Neutro Abs 8.8 (*)    Monocytes Absolute 1.3 (*)    All other components within normal limits  COMPREHENSIVE METABOLIC PANEL - Abnormal; Notable for the following:    Sodium 133 (*)    Chloride 100 (*)    BUN 43 (*)  Creatinine, Ser 2.45 (*)    Albumin 3.1 (*)    AST 42 (*)    Total Bilirubin 1.7 (*)    GFR calc non Af Amer 23 (*)    GFR calc Af Amer 27 (*)    All other components within normal limits  PROTIME-INR - Abnormal; Notable for the following:    Prothrombin Time 16.9 (*)    All other components within normal limits  APTT - Abnormal; Notable for the following:    aPTT 41 (*)    All other components within normal limits  POC OCCULT BLOOD, ED - Abnormal; Notable for the following:    Fecal Occult Bld POSITIVE (*)    All other components within normal limits  URINALYSIS, ROUTINE W REFLEX MICROSCOPIC (NOT AT [facility])  BRAIN NATRIURETIC PEPTIDE    Imaging Review Dg Chest 2 View  07/27/2015  CLINICAL DATA:  Lower extremity edema, fatigue and abdominal distension. EXAM: CHEST  2 VIEW COMPARISON:  Chest x-rays dated 02/17/2015 and 12/27/2011. FINDINGS: Heart size is normal. Overall cardiomediastinal silhouette is normal in size and configuration. Lungs are clear. No evidence of pneumonia. No pleural effusion or pneumothorax  seen. Osseous and soft tissue structures about the chest are unremarkable. IMPRESSION: Lungs are clear and there is no evidence of acute cardiopulmonary abnormality. Electronically Signed   By: Franki Cabot M.D.   On: 07/27/2015 19:27   I have personally reviewed and evaluated these images and lab results as part of my medical decision-making.   EKG Interpretation None      MDM   Final diagnoses:  Alcoholic cirrhosis of liver with ascites ([facility])  Guaiac positive stools  Bilateral edema of lower extremity   45 year old female with history of alcoholic cirrhosis complicated by varices who presents with dark stools and increased lower extremity edema. She is well-appearing in no acute distress on presentation. Vital signs are stable. She has a soft and benign abdomen with some moderate distention. Pedal edema is also noted. On rectal exam she has guaiac positive [place] stool, but no melena or hematochezia. Her BUN/creatinine ratio is mildly elevated, and she has acute kidney injury from a baseline of 1.3 to 2.45 today, which is the likely cause of her edema. Discussed with Dr. Laural Golden from GI given her history of varices with guaiac positive stool and mildly elevated BUN/creatinine ratio. He will evaluate patient in the morning for potential endoscopy. He did not recommend octreotide drip currently. I subsequently discussed with Dr. Marin Comment who will admit for serial hemoglobins and monitoring.    Forde Dandy, MD 07/27/15 [id number]  Forde Dandy, MD 07/27/15 [phone]

## 2015-07-27 NOTE — H&P (Signed)
History and Physical    Whitney Bush [id number] DOB: 1971-02-21 DOA: 07/27/2015  Referring MD/NP/PA: Forde Dandy, MD PCP: Shade Flood, MD  Outpatient Specialists: Gastroenterology; Daneil Dolin, MD Patient coming from: home  Chief Complaint: leg swelling  HPI: Whitney Bush is a 45 y.o. female with medical history significant of lower extremity edema, alcoholic cirrhosis presented with complaints of increasing lower extremity edema and abdominal distention over the past month. She reports that she has been compliant with her Lasix, but has been urinating less. She also reports associated shortness of breath with exertion and difficulty breathing while lying supine. She denies any nausea, vomiting, diarrhea, melena, hematochezia, chills, cough, or chest pain.    While in the ED, her vita signs were noted to be stable. Her BUN and Cr were also noted to be elevated, with Cr at 2.45. BNP level was wnl. CBC showed a mildly elevated WBC count at 13.8, and Hgb was mildly decreased at 11.3. Fecal occult blood test was done and was positive. CXR was performed and showed no acute cardiopulmonary disease. Hospitalist was asked to admit for observation of possible GI bleed after EDP consultation with Dr Dereck Leep, and for AKI.    Review of Systems: As per HPI otherwise 10 point review of systems negative.   Past Medical History  Diagnosis Date  . Hypertension   . Depression   . Migraines   . Peripheral edema   . Alcohol abuse   . Alcoholic cirrhosis of liver ([place])   . Anemia     Past Surgical History  Procedure Laterality Date  . Abcess drainage      x5; neck, arm, chest, back  . Cesarean section    . Esophagogastroduodenoscopy (egd) with propofol N/A 02/24/2015    SLF: Grade II esophageal varices Moderate portal hypertensive gastropathy Mild erosive gastritis anemia likely due to many factors: gastritis, gastropathy, coagulopathy, chronic disease  . Biopsy  02/24/2015   Procedure: BIOPSY;  Surgeon: Danie Binder, MD;  [facility];  Service: Endoscopy;;  gastric bx's  . Colonoscopy with propofol N/A 06/28/2015    Procedure: COLONOSCOPY WITH PROPOFOL;  Surgeon: Daneil Dolin, MD;  [facility];  Service: Endoscopy;  Laterality: N/A;  1415  . Polypectomy  06/28/2015    Procedure: POLYPECTOMY;  Surgeon: Daneil Dolin, MD;  [facility];  Service: Endoscopy;;  at splenic flexure     reports that she quit smoking about 4 years ago. Her smoking use included Cigarettes. She has a 5 pack-year smoking history. She has quit using smokeless tobacco. Her smokeless tobacco use included Snuff. She reports that she drinks about 2.4 oz of alcohol per week. She reports that she does not use illicit drugs.  No Known Allergies  Family History  Problem Relation Age of Onset  . Diabetes Father   . Cancer Other   . Cervical cancer Maternal Grandmother   . Lung cancer Maternal Grandfather   . Alcohol abuse Other     multiple family members  . Colon cancer Neg Hx   . Liver disease Neg Hx     Prior to Admission medications   Medication Sig Start Date End Date Taking? Authorizing Provider  B Complex Vitamins (VITAMIN B-COMPLEX PO) Take 1 tablet by mouth daily.   Yes Historical Provider, MD  citalopram (CELEXA) 20 MG tablet Take 20 mg by mouth daily.   Yes Historical Provider, MD  folic acid (FOLVITE) 1 MG tablet  Take 1 tablet (1 mg total) by mouth daily. 12/05/14  Yes Estela Leonie Green, MD  furosemide (LASIX) 40 MG tablet Take 1 tablet (40 mg total) by mouth 2 (two) times daily. 03/22/15  Yes Mahala Menghini, PA-C  lactulose (CHRONULAC) 10 GM/15ML solution Take 15 mLs (10 g total) by mouth 2 (two) times daily. 06/04/15  Yes Mahala Menghini, PA-C  Multiple Vitamin (MULTIVITAMIN WITH MINERALS) TABS tablet Take 1 tablet by mouth daily. 12/05/14  Yes Estela Leonie Green, MD  spironolactone (ALDACTONE) 100 MG tablet Take 1 tablet (100 mg  total) by mouth 2 (two) times daily. 03/22/15  Yes Mahala Menghini, PA-C  vitamin E 400 UNIT capsule Take 400 Units by mouth daily.   Yes Historical Provider, MD  doxycycline (VIBRAMYCIN) 100 MG capsule Take 1 capsule (100 mg total) by mouth 2 (two) times daily with a meal. Patient not taking: Reported on 06/15/2015 06/04/15   Mahala Menghini, PA-C  polyethylene glycol-electrolytes (NULYTELY/GOLYTELY) 420 g solution Take 4,000 mLs by mouth once. Patient not taking: Reported on 07/27/2015 06/04/15   Mahala Menghini, PA-C    Physical Exam: Filed Vitals:   07/27/15 1437 07/27/15 1930  BP: 131/90 118/74  Pulse: 85 88  Temp: 98.8 F (37.1 C)   TempSrc: Oral   Resp: 16 18  Height: 5\' 3"  (1.6 m)   Weight: 90.266 kg (199 lb)   SpO2: 98% 100%   Constitutional: NAD, calm, comfortable Filed Vitals:   07/27/15 1437 07/27/15 1930  BP: 131/90 118/74  Pulse: 85 88  Temp: 98.8 F (37.1 C)   TempSrc: Oral   Resp: 16 18  Height: 5\' 3"  (1.6 m)   Weight: 90.266 kg (199 lb)   SpO2: 98% 100%   Eyes: PERRL, lids and conjunctivae normal ENMT: Mucous membranes are moist. Posterior pharynx clear of any exudate or lesions.Normal dentition.  Neck: normal, supple, no masses, no thyromegaly Respiratory: clear to auscultation bilaterally, no wheezing, no crackles. Normal respiratory effort. No accessory muscle use.  Cardiovascular: Regular rate and rhythm, no murmurs / rubs / gallops. No extremity edema. 2+ pedal pulses. No carotid bruits.  Abdomen: no tenderness, no masses palpated. No hepatosplenomegaly. Bowel sounds positive. Small nonpurulent boil on abdomen.  I&D was performed. Musculoskeletal: no clubbing / cyanosis. No joint deformity upper and lower extremities. Good ROM, no contractures. Normal muscle tone.  Skin: no rashes, lesions, ulcers. No induration Neurologic: CN 2-12 grossly intact. Sensation intact, DTR normal. Strength 5/5 in all 4.  Psychiatric: Normal judgment and insight. Alert and oriented x  3. Normal mood.   Labs on Admission: I have personally reviewed following labs and imaging studies  CBC:  Recent Labs Lab 07/27/15 1920  WBC 13.8*  NEUTROABS 8.8*  HGB 11.3*  HCT 32.5*  MCV 91.0  PLT [id number]*   Basic Metabolic Panel:  Recent Labs Lab 07/27/15 1920  NA 133*  K 4.1  CL 100*  CO2 26  GLUCOSE 83  BUN 43*  CREATININE 2.45*  CALCIUM 9.6   GFR: Estimated Creatinine Clearance: 31.6 mL/min (by C-G formula based on Cr of 2.45). Liver Function Tests:  Recent Labs Lab 07/27/15 1920  AST 42*  ALT 23  ALKPHOS 61  BILITOT 1.7*  PROT 7.9  ALBUMIN 3.1*   Coagulation Profile:  Recent Labs Lab 07/27/15 1920  INR 1.36   Urine analysis:    Component Value Date/Time   COLORURINE YELLOW 07/27/2015 1817   APPEARANCEUR CLEAR 07/27/2015 1817  LABSPEC 1.010 07/27/2015 1817   PHURINE 5.5 07/27/2015 1817   GLUCOSEU NEGATIVE 07/27/2015 1817   HGBUR NEGATIVE 07/27/2015 1817   BILIRUBINUR NEGATIVE 07/27/2015 1817   KETONESUR NEGATIVE 07/27/2015 1817   PROTEINUR NEGATIVE 07/27/2015 1817   UROBILINOGEN 0.2 12/11/2014 1222   NITRITE NEGATIVE 07/27/2015 1817   LEUKOCYTESUR NEGATIVE 07/27/2015 1817    Radiological Exams on Admission: Dg Chest 2 View  07/27/2015  CLINICAL DATA:  Lower extremity edema, fatigue and abdominal distension. EXAM: CHEST  2 VIEW COMPARISON:  Chest x-rays dated 02/17/2015 and 12/27/2011. FINDINGS: Heart size is normal. Overall cardiomediastinal silhouette is normal in size and configuration. Lungs are clear. No evidence of pneumonia. No pleural effusion or pneumothorax seen. Osseous and soft tissue structures about the chest are unremarkable. IMPRESSION: Lungs are clear and there is no evidence of acute cardiopulmonary abnormality. Electronically Signed   By: Franki Cabot M.D.   On: 07/27/2015 19:27    EKG: Independently reviewed.   Assessment/Plan Principal Problem:   GI bleed Active Problems:   Alcohol abuse   Rectal bleeding    Boil   GI bleeding  1. Possible upper GI bleed. She is not actively bleeding at this time. Hx of esophageal varices. Guiac positive stool and Hgb mildly decreased at 11.3.  Will type and screen blood in the event that she needs a transfusion. GI has been consulted and Dr. Laural Golden will evaluate the patient in the morning for possible endoscopy. He did not recommend octreotide drip. 2. AKI. Cr elevated to 2.45, up from baseline of 1.3. Patient is probably volume depleted. Follow, give fluids, and hold diuretics for now.  3. Alcohol abuse. Pt has a hx of cirrhosis of the liver. Counseled on the importance of cessation.    DVT prophylaxis: SCD's Code Status: Full Family Communication: Discussed with patient, no family present at bedside. Disposition Plan: Discharge home once improved. Consults called: Gastro Admission status: Admit to observation.  Orvan Falconer, MD FACP [facility]  If 7PM-7AM, please contact night-coverage [email or web address] Password [email or web address]  07/27/2015, 11:16 PM   By signing my name below, I, Delene Ruffini, attest that this documentation has been prepared under the direction and in the presence of Orvan Falconer, MD. Electronically Signed: Delene Ruffini, Scribe 07/27/2015 11:15pm

## 2015-07-27 NOTE — ED Notes (Signed)
Pt reports lower extremity edema, increased fatige, and abdominal distention. Pt also reports a possible abscess to abdomen. Pt hx of liver disease.

## 2015-07-28 DIAGNOSIS — R195 Other fecal abnormalities: Secondary | ICD-10-CM | POA: Diagnosis not present

## 2015-07-28 DIAGNOSIS — Z801 Family history of malignant neoplasm of trachea, bronchus and lung: Secondary | ICD-10-CM | POA: Diagnosis not present

## 2015-07-28 DIAGNOSIS — I10 Essential (primary) hypertension: Secondary | ICD-10-CM | POA: Diagnosis present

## 2015-07-28 DIAGNOSIS — L02211 Cutaneous abscess of abdominal wall: Secondary | ICD-10-CM | POA: Diagnosis present

## 2015-07-28 DIAGNOSIS — K7030 Alcoholic cirrhosis of liver without ascites: Secondary | ICD-10-CM | POA: Diagnosis present

## 2015-07-28 DIAGNOSIS — R140 Abdominal distension (gaseous): Secondary | ICD-10-CM | POA: Diagnosis present

## 2015-07-28 DIAGNOSIS — D6959 Other secondary thrombocytopenia: Secondary | ICD-10-CM | POA: Diagnosis present

## 2015-07-28 DIAGNOSIS — Z833 Family history of diabetes mellitus: Secondary | ICD-10-CM | POA: Diagnosis not present

## 2015-07-28 DIAGNOSIS — F1010 Alcohol abuse, uncomplicated: Secondary | ICD-10-CM | POA: Diagnosis present

## 2015-07-28 DIAGNOSIS — K7031 Alcoholic cirrhosis of liver with ascites: Secondary | ICD-10-CM

## 2015-07-28 DIAGNOSIS — K922 Gastrointestinal hemorrhage, unspecified: Principal | ICD-10-CM

## 2015-07-28 DIAGNOSIS — M79671 Pain in right foot: Secondary | ICD-10-CM | POA: Diagnosis present

## 2015-07-28 DIAGNOSIS — M79672 Pain in left foot: Secondary | ICD-10-CM | POA: Diagnosis present

## 2015-07-28 DIAGNOSIS — T39395A Adverse effect of other nonsteroidal anti-inflammatory drugs [NSAID], initial encounter: Secondary | ICD-10-CM | POA: Diagnosis present

## 2015-07-28 DIAGNOSIS — Z8049 Family history of malignant neoplasm of other genital organs: Secondary | ICD-10-CM | POA: Diagnosis not present

## 2015-07-28 DIAGNOSIS — Z87891 Personal history of nicotine dependence: Secondary | ICD-10-CM | POA: Diagnosis not present

## 2015-07-28 DIAGNOSIS — N179 Acute kidney failure, unspecified: Secondary | ICD-10-CM | POA: Diagnosis present

## 2015-07-28 DIAGNOSIS — R600 Localized edema: Secondary | ICD-10-CM | POA: Diagnosis not present

## 2015-07-28 HISTORY — DX: Gastrointestinal hemorrhage, unspecified: K92.2

## 2015-07-28 LAB — CBC
HEMATOCRIT: 28.5 % — AB (ref 36.0–46.0)
Hemoglobin: 9.8 g/dL — ABNORMAL LOW (ref 12.0–15.0)
MCH: 31.4 pg (ref 26.0–34.0)
MCHC: 34.4 g/dL (ref 30.0–36.0)
MCV: 91.3 fL (ref 78.0–100.0)
PLATELETS: 124 10*3/uL — AB (ref 150–400)
RBC: 3.12 MIL/uL — ABNORMAL LOW (ref 3.87–5.11)
RDW: 14.1 % (ref 11.5–15.5)
WBC: 12.1 10*3/uL — ABNORMAL HIGH (ref 4.0–10.5)

## 2015-07-28 LAB — BASIC METABOLIC PANEL
Anion gap: 8 (ref 5–15)
BUN: 44 mg/dL — ABNORMAL HIGH (ref 6–20)
CALCIUM: 9 mg/dL (ref 8.9–10.3)
CO2: 25 mmol/L (ref 22–32)
CREATININE: 2.16 mg/dL — AB (ref 0.44–1.00)
Chloride: 101 mmol/L (ref 101–111)
GFR, EST AFRICAN AMERICAN: 31 mL/min — AB (ref >60)
GFR, EST NON AFRICAN AMERICAN: 27 mL/min — AB (ref >60)
Glucose, Bld: 84 mg/dL (ref 65–99)
Potassium: 3.9 mmol/L (ref 3.5–5.1)
Sodium: 134 mmol/L — ABNORMAL LOW (ref 135–145)

## 2015-07-28 MED ORDER — CITALOPRAM HYDROBROMIDE 20 MG PO TABS
20.0000 mg | ORAL_TABLET | Freq: Every day | ORAL | Status: DC
  Administered 2015-07-28 – 2015-07-29 (×2): 20 mg via ORAL
  Filled 2015-07-28 (×2): qty 1

## 2015-07-28 MED ORDER — FOLIC ACID 1 MG PO TABS
1.0000 mg | ORAL_TABLET | Freq: Every day | ORAL | Status: DC
  Administered 2015-07-28 – 2015-07-29 (×2): 1 mg via ORAL
  Filled 2015-07-28 (×2): qty 1

## 2015-07-28 MED ORDER — ADULT MULTIVITAMIN W/MINERALS CH
1.0000 | ORAL_TABLET | Freq: Every day | ORAL | Status: DC
  Administered 2015-07-28 – 2015-07-29 (×2): 1 via ORAL
  Filled 2015-07-28 (×2): qty 1

## 2015-07-28 MED ORDER — PANTOPRAZOLE SODIUM 40 MG IV SOLR
40.0000 mg | Freq: Two times a day (BID) | INTRAVENOUS | Status: DC
  Administered 2015-07-28 – 2015-07-29 (×4): 40 mg via INTRAVENOUS
  Filled 2015-07-28 (×4): qty 40

## 2015-07-28 MED ORDER — DEXTROSE-NACL 5-0.9 % IV SOLN
INTRAVENOUS | Status: DC
  Administered 2015-07-28 (×2): via INTRAVENOUS

## 2015-07-28 MED ORDER — LACTULOSE 10 GM/15ML PO SOLN
10.0000 g | Freq: Two times a day (BID) | ORAL | Status: DC
  Administered 2015-07-28 – 2015-07-29 (×4): 10 g via ORAL
  Filled 2015-07-28 (×3): qty 30

## 2015-07-28 MED ORDER — TRAMADOL HCL 50 MG PO TABS
25.0000 mg | ORAL_TABLET | Freq: Four times a day (QID) | ORAL | Status: DC | PRN
  Administered 2015-07-28 – 2015-07-29 (×3): 25 mg via ORAL
  Filled 2015-07-28 (×3): qty 1

## 2015-07-28 NOTE — Progress Notes (Signed)
Pt complained of numbness in her feet.  No pulses were observed by touch nor by doppler.

## 2015-07-28 NOTE — ED Notes (Signed)
Report given to [facility] on 300

## 2015-07-28 NOTE — Consult Note (Signed)
Reason for Consult:GI bleed Referring Physician: Hospitalist  Whitney Bush is an 45 y.o. female.  HPI: Admitted yesterday afternoon around 2pm. She Says she has an abscess on her rt lower abdomen. She underwent an I and D while in the ED. She came to the ED because of headaches, back pain and her feet were swelling. She also says her stools were chocolate [place]. Her stool was posiitve for blood in the ED. Dark stools for about a week. Has had a drop from 11.3 to 9.8. Recent hx of EGD December, 2016 for melena by Dr .Oneida Alar. Hx of cirrhosis. Hx of jaundice. Hx of alcoholiism. Has not drank in a couple of months.  Hx of ascites in the past per patient. Hx of positive Hep C antibody but undetected viral load. No hx of IV drugs. Underwent a colonoscopy 06/28/2015 by Dr. Gala Romney for rectal bleeding, anemia.  Findings:  Colonoscopy preparation was inadequate. Patient's ingestion of at least   a [place] sandwich yesterday was in conflict with our written and verbal   prep instructions.  The perianal and digital rectal examinations were normal.  A 6 mm polyp was found in the splenic flexure. The polyp was   semi-pedunculated. The polyp was removed with a cold snare. Resection   and retrieval were complete. There was some oozing at the polypectomy   site for which a resolution clip was placed Biopsy: Tubular adenoma  CBC Latest Ref Rng 07/28/2015 07/27/2015 06/23/2015  WBC 4.0 - 10.5 K/uL 12.1(H) 13.8(H) 11.2(H)  Hemoglobin 12.0 - 15.0 g/dL 9.8(L) 11.3(L) 11.5(L)  Hematocrit 36.0 - 46.0 % 28.5(L) 32.5(L) 33.6(L)  Platelets 150 - 400 K/uL 124(L) 134(L) 134(L)      Past Medical History  Diagnosis Date  . Hypertension   . Depression   . Migraines   . Peripheral edema   . Alcohol abuse   . Alcoholic cirrhosis of liver ([place])   . Anemia     Past Surgical History  Procedure Laterality Date  . Abcess drainage      x5; neck, arm, chest, back  . Cesarean  section    . Esophagogastroduodenoscopy (egd) with propofol N/A 02/24/2015    SLF: Grade II esophageal varices Moderate portal hypertensive gastropathy Mild erosive gastritis anemia likely due to many factors: gastritis, gastropathy, coagulopathy, chronic disease  . Biopsy  02/24/2015    Procedure: BIOPSY;  Surgeon: Danie Binder, MD;  [facility];  Service: Endoscopy;;  gastric bx's  . Colonoscopy with propofol N/A 06/28/2015    Procedure: COLONOSCOPY WITH PROPOFOL;  Surgeon: Daneil Dolin, MD;  Location: [facility];  Service: Endoscopy;  Laterality: N/A;  1415  . Polypectomy  06/28/2015    Procedure: POLYPECTOMY;  Surgeon: Daneil Dolin, MD;  Location: [facility];  Service: Endoscopy;;  at splenic flexure    Family History  Problem Relation Age of Onset  . Diabetes Father   . Cancer Other   . Cervical cancer Maternal Grandmother   . Lung cancer Maternal Grandfather   . Alcohol abuse Other     multiple family members  . Colon cancer Neg Hx   . Liver disease Neg Hx     Social History:  reports that she quit smoking about 4 years ago. Her smoking use included Cigarettes. She has a 5 pack-year smoking history. She has quit using smokeless tobacco. Her smokeless tobacco use included Snuff. She reports that she drinks about 2.4 oz of alcohol per week. She reports  that she does not use illicit drugs.  Allergies: No Known Allergies  Medications: I have reviewed the patient's current medications.  Results for orders placed or performed during the hospital encounter of 07/27/15 (from the past 48 hour(s))  Urinalysis, Routine w reflex microscopic (not at [facility])     Status: None   Collection Time: 07/27/15  6:17 PM  Result Value Ref Range   Color, Urine YELLOW YELLOW   APPearance CLEAR CLEAR   Specific Gravity, Urine 1.010 1.005 - 1.030   pH 5.5 5.0 - 8.0   Glucose, UA NEGATIVE NEGATIVE mg/dL   Hgb urine dipstick NEGATIVE NEGATIVE   Bilirubin Urine NEGATIVE NEGATIVE    Ketones, ur NEGATIVE NEGATIVE mg/dL   Protein, ur NEGATIVE NEGATIVE mg/dL   Nitrite NEGATIVE NEGATIVE   Leukocytes, UA NEGATIVE NEGATIVE    Comment: MICROSCOPIC NOT DONE ON URINES WITH NEGATIVE PROTEIN, BLOOD, LEUKOCYTES, NITRITE, OR GLUCOSE <1000 mg/dL.  POC occult blood, ED     Status: Abnormal   Collection Time: 07/27/15  6:39 PM  Result Value Ref Range   Fecal Occult Bld POSITIVE (A) NEGATIVE  CBC with Differential     Status: Abnormal   Collection Time: 07/27/15  7:20 PM  Result Value Ref Range   WBC 13.8 (H) 4.0 - 10.5 K/uL   RBC 3.57 (L) 3.87 - 5.11 MIL/uL   Hemoglobin 11.3 (L) 12.0 - 15.0 g/dL   HCT 32.5 (L) 36.0 - 46.0 %   MCV 91.0 78.0 - 100.0 fL   MCH 31.7 26.0 - 34.0 pg   MCHC 34.8 30.0 - 36.0 g/dL   RDW 13.5 11.5 - 15.5 %   Platelets 134 (L) 150 - 400 K/uL   Neutrophils Relative % 64 %   Neutro Abs 8.8 (H) 1.7 - 7.7 K/uL   Lymphocytes Relative 26 %   Lymphs Abs 3.5 0.7 - 4.0 K/uL   Monocytes Relative 9 %   Monocytes Absolute 1.3 (H) 0.1 - 1.0 K/uL   Eosinophils Relative 1 %   Eosinophils Absolute 0.2 0.0 - 0.7 K/uL   Basophils Relative 0 %   Basophils Absolute 0.0 0.0 - 0.1 K/uL  Comprehensive metabolic panel     Status: Abnormal   Collection Time: 07/27/15  7:20 PM  Result Value Ref Range   Sodium 133 (L) 135 - 145 mmol/L   Potassium 4.1 3.5 - 5.1 mmol/L   Chloride 100 (L) 101 - 111 mmol/L   CO2 26 22 - 32 mmol/L   Glucose, Bld 83 65 - 99 mg/dL   BUN 43 (H) 6 - 20 mg/dL   Creatinine, Ser 2.45 (H) 0.44 - 1.00 mg/dL   Calcium 9.6 8.9 - 10.3 mg/dL   Total Protein 7.9 6.5 - 8.1 g/dL   Albumin 3.1 (L) 3.5 - 5.0 g/dL   AST 42 (H) 15 - 41 U/L   ALT 23 14 - 54 U/L   Alkaline Phosphatase 61 38 - 126 U/L   Total Bilirubin 1.7 (H) 0.3 - 1.2 mg/dL   GFR calc non Af Amer 23 (L) >60 mL/min   GFR calc Af Amer 27 (L) >60 mL/min    Comment: (NOTE) The eGFR has been calculated using the CKD EPI equation. This calculation has not been validated in all clinical  situations. eGFR's persistently <60 mL/min signify possible Chronic Kidney Disease.    Anion gap 7 5 - 15  Protime-INR     Status: Abnormal   Collection Time: 07/27/15  7:20 PM  Result Value  Ref Range   Prothrombin Time 16.9 (H) 11.6 - 15.2 seconds   INR 1.36 0.00 - 1.49  APTT     Status: Abnormal   Collection Time: 07/27/15  7:20 PM  Result Value Ref Range   aPTT 41 (H) 24 - 37 seconds    Comment:        IF BASELINE aPTT IS ELEVATED, SUGGEST PATIENT RISK ASSESSMENT BE USED TO DETERMINE APPROPRIATE ANTICOAGULANT THERAPY.   Brain natriuretic peptide     Status: None   Collection Time: 07/27/15  7:20 PM  Result Value Ref Range   B Natriuretic Peptide 29.0 0.0 - 100.0 pg/mL  CBC     Status: Abnormal   Collection Time: 07/28/15  4:11 AM  Result Value Ref Range   WBC 12.1 (H) 4.0 - 10.5 K/uL   RBC 3.12 (L) 3.87 - 5.11 MIL/uL   Hemoglobin 9.8 (L) 12.0 - 15.0 g/dL   HCT 28.5 (L) 36.0 - 46.0 %   MCV 91.3 78.0 - 100.0 fL   MCH 31.4 26.0 - 34.0 pg   MCHC 34.4 30.0 - 36.0 g/dL   RDW 14.1 11.5 - 15.5 %   Platelets 124 (L) 150 - 400 K/uL  Basic metabolic panel     Status: Abnormal   Collection Time: 07/28/15  4:11 AM  Result Value Ref Range   Sodium 134 (L) 135 - 145 mmol/L   Potassium 3.9 3.5 - 5.1 mmol/L   Chloride 101 101 - 111 mmol/L   CO2 25 22 - 32 mmol/L   Glucose, Bld 84 65 - 99 mg/dL   BUN 44 (H) 6 - 20 mg/dL   Creatinine, Ser 2.16 (H) 0.44 - 1.00 mg/dL   Calcium 9.0 8.9 - 10.3 mg/dL   GFR calc non Af Amer 27 (L) >60 mL/min   GFR calc Af Amer 31 (L) >60 mL/min    Comment: (NOTE) The eGFR has been calculated using the CKD EPI equation. This calculation has not been validated in all clinical situations. eGFR's persistently <60 mL/min signify possible Chronic Kidney Disease.    Anion gap 8 5 - 15    Dg Chest 2 View  07/27/2015  CLINICAL DATA:  Lower extremity edema, fatigue and abdominal distension. EXAM: CHEST  2 VIEW COMPARISON:  Chest x-rays dated 02/17/2015 and  12/27/2011. FINDINGS: Heart size is normal. Overall cardiomediastinal silhouette is normal in size and configuration. Lungs are clear. No evidence of pneumonia. No pleural effusion or pneumothorax seen. Osseous and soft tissue structures about the chest are unremarkable. IMPRESSION: Lungs are clear and there is no evidence of acute cardiopulmonary abnormality. Electronically Signed   By: Franki Cabot M.D.   On: 07/27/2015 19:27    ROS Blood pressure 124/81, pulse 94, temperature 98 F (36.7 C), temperature source Oral, resp. rate 20, height 5' 3"  (1.6 m), weight 197 lb 4.8 oz (89.495 kg), last menstrual period 07/18/2015, SpO2 94 %. Physical Exam Alert and oriented. Skin warm and dry. Oral mucosa is moist.   . Sclera anicteric, conjunctivae is pink. Thyroid not enlarged. No cervical lymphadenopathy. Lungs clear. Heart regular rate and rhythm.  Abdomen is soft. Bowel sounds are positive. No hepatomegaly. No abdominal masses felt. No tenderness. 1+ edema to lower extremities.     Assessment/Plan: UGI bleed. Will discuss with Dr. Laural Golden.   SETZER,TERRI W 07/28/2015, 7:44 AM   GI attending note: Patient interviewed and examined. Patient is 44 year old African-American female with known alcoholic cirrhosis who was admitted last evening via emergency  room where she presented with headache pain in her back and both feet. She was noted to have heme-positive stool and elevated serum creatinine. Patient denies hematemesis melena or rectal bleeding. She states she has had headache and has been taking 2-4 tablets of Aleve per day. She states she has gained few pounds last month. Admission she weighed 197 pounds. She weighed 247 pounds back in December 2016. She states she has had one drink this year. Patient is alert and does not have asterixis. She does not have asterixis. Abdomen is full but soft and nontender without organomegaly or masses. Trace edema noted around ankles.  Lab data: INR 1.36 on  07/27/2015 Hemoglobin yesterday was 11.3 and 9.8 this morning Platelet count this morning 124K AST 42, ALT 23 and serum albumin 3.1.   Assessment:  #1. Anemia. She has no evidence of overt GI bleed. Heme positive stool most likely secondary to daily NSAID use. #2. AKI most likely secondary to NSAID use. Renal function is improving. Grieved with gentle hydration but doubt that she has hepatorenal syndrome. Patient advised not to take OTC NSAIDs. #3. Alcoholic liver disease. She does not have ascites. INR has been gradually improving and serum albumin has been creeping up over the last 6 months which is reassuring and would indicate that she is not drinking alcohol.  Recommendations:  Diet advanced to heart healthy diet. Patient have repeat CBC and metabolic 7 in a.m.

## 2015-07-28 NOTE — Progress Notes (Signed)
Notified Dr Cruzita Lederer via text page of pt c/o headache, and no PRN meds ordered.  Also pt is requesting Lasix BID, that is on PTA med list.  Awaiting call back.

## 2015-07-28 NOTE — Progress Notes (Signed)
PROGRESS NOTE  Whitney Bush [id number] DOB: 01/06/1971 DOA: 07/27/2015 PCP: Shade Flood, MD     Brief Narrative: 45 y.o. female with medical history significant of lower extremity edema, alcoholic cirrhosis presented with complaints of increasing lower extremity edema and abdominal distention over the past month. She reports that she has been compliant with her Lasix, but has been urinating less. She also reports associated shortness of breath with exertion and difficulty breathing while lying supine. She denies any nausea, vomiting, diarrhea, melena, hematochezia, chills, cough, or chest pain.   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 Complaints of black stools.  Objective: Filed Vitals:   07/27/15 1437 07/27/15 1930 07/28/15 0244 07/28/15 0510  BP: 131/90 118/74 122/81 124/81  Pulse: 85 88 93 94  Temp: 98.8 F (37.1 C)  97.1 F (36.2 C) 98 F (36.7 C)  TempSrc: Oral  Oral Oral  Resp: 16 18 20 20   Height: 5\' 3"  (1.6 m)  5\' 3"  (1.6 m)   Weight: 90.266 kg (199 lb)  89.495 kg (197 lb 4.8 oz)   SpO2: 98% 100% 93% 94%   No intake or output data in the 24 hours  ending 07/28/15 0907 Filed Weights   07/27/15 1437 07/28/15 0244  Weight: 90.266 kg (199 lb) 89.495 kg (197 lb 4.8 oz)    Examination: Constitutional: NAD Filed Vitals:   07/27/15 1437 07/27/15 1930 07/28/15 0244 07/28/15 0510  BP: 131/90 118/74 122/81 124/81  Pulse: 85 88 93 94  Temp: 98.8 F (37.1 C)  97.1 F (36.2 C) 98 F (36.7 C)  TempSrc: Oral  Oral Oral  Resp: 16 18 20 20   Height: 5\' 3"  (1.6 m)  5\' 3"  (1.6 m)   Weight: 90.266 kg (199 lb)  89.495 kg (197 lb 4.8 oz)   SpO2: 98% 100% 93% 94%   Eyes: PERRL, lids and conjunctivae normal, mild scleral icterus ENMT: Mucous membranes are moist.  Respiratory: clear to auscultation bilaterally, no wheezing, no crackles. Normal respiratory effort. No accessory muscle use.  Cardiovascular: Regular rate and rhythm, no murmurs / rubs / gallops. No LE edema. 2+ pedal pulses. No carotid bruits.  Abdomen: no tenderness. Bowel sounds positive.  Musculoskeletal: no clubbing / cyanosis. No joint deformity upper and lower extremities. No contractures. Normal muscle tone.    Data Reviewed: I have personally reviewed following labs and imaging studies  CBC:  Recent Labs Lab 07/27/15 1920 07/28/15 0411  WBC 13.8* 12.1*  NEUTROABS 8.8*  --   HGB 11.3* 9.8*  HCT 32.5* 28.5*  MCV 91.0 91.3  PLT 134* [id number]*   Basic Metabolic Panel:  Recent Labs Lab  07/27/15 1920 07/28/15 0411  NA 133* 134*  K 4.1 3.9  CL 100* 101  CO2 26 25  GLUCOSE 83 84  BUN 43* 44*  CREATININE 2.45* 2.16*  CALCIUM 9.6 9.0   GFR: Estimated Creatinine Clearance: 35.6 mL/min (by C-G formula based on Cr of 2.16). Liver Function Tests:  Recent Labs Lab 07/27/15 1920  AST 42*  ALT 23  ALKPHOS 61  BILITOT 1.7*  PROT 7.9  ALBUMIN 3.1*   No results for input(s): LIPASE, AMYLASE in the last 168 hours. No results for input(s): AMMONIA in the last 168 hours. Coagulation Profile:  Recent Labs Lab 07/27/15 1920  INR 1.36   Cardiac Enzymes: No results  for input(s): CKTOTAL, CKMB, CKMBINDEX, TROPONINI in the last 168 hours. BNP (last 3 results) No results for input(s): PROBNP in the last 8760 hours. HbA1C: No results for input(s): HGBA1C in the last 72 hours. CBG: No results for input(s): GLUCAP in the last 168 hours. Lipid Profile: No results for input(s): CHOL, HDL, LDLCALC, TRIG, CHOLHDL, LDLDIRECT in the last 72 hours. Thyroid Function Tests: No results for input(s): TSH, T4TOTAL, FREET4, T3FREE, THYROIDAB in the last 72 hours. Anemia Panel: No results for input(s): VITAMINB12, FOLATE, FERRITIN, TIBC, IRON, RETICCTPCT in the last 72 hours. Urine analysis:    Component Value Date/Time   COLORURINE YELLOW 07/27/2015 1817   APPEARANCEUR CLEAR 07/27/2015 1817   LABSPEC 1.010 07/27/2015 1817   PHURINE 5.5 07/27/2015 1817   GLUCOSEU NEGATIVE 07/27/2015 1817   HGBUR NEGATIVE 07/27/2015 1817   BILIRUBINUR NEGATIVE 07/27/2015 1817   KETONESUR NEGATIVE 07/27/2015 1817   PROTEINUR NEGATIVE 07/27/2015 1817   UROBILINOGEN 0.2 12/11/2014 1222   NITRITE NEGATIVE 07/27/2015 1817   LEUKOCYTESUR NEGATIVE 07/27/2015 1817   Sepsis Labs: Invalid input(s): PROCALCITONIN, LACTICIDVEN  No results found for this or any previous visit (from the past 240 hour(s)).    Radiology Studies: Dg Chest 2 View  07/27/2015  CLINICAL DATA:  Lower extremity edema, fatigue and abdominal distension. EXAM: CHEST  2 VIEW COMPARISON:  Chest x-rays dated 02/17/2015 and 12/27/2011. FINDINGS: Heart size is normal. Overall cardiomediastinal silhouette is normal in size and configuration. Lungs are clear. No evidence of pneumonia. No pleural effusion or pneumothorax seen. Osseous and soft tissue structures about the chest are unremarkable. IMPRESSION: Lungs are clear and there is no evidence of acute cardiopulmonary abnormality. Electronically Signed   By: Franki Cabot M.D.   On: 07/27/2015 19:27     Scheduled Meds: . citalopram  20 mg Oral Daily  . folic acid  1  mg Oral Daily  . lactulose  10 g Oral BID  . multivitamin with minerals  1 tablet Oral Daily  . pantoprazole (PROTONIX) IV  40 mg Intravenous Q12H   Continuous Infusions: . dextrose 5 % and 0.9% NaCl 50 mL/hr at 07/28/15 0147    Marzetta Board, MD, PhD [facility] Pager [phone]  If 7PM-7AM, please contact night-coverage [email or web address] Password [email or web address] 07/28/2015, 9:07 AM

## 2015-07-28 NOTE — Care Management Note (Signed)
Case Management Note  Patient Details  Name: Whitney Bush MRN: [id number] Date of Birth: [birth year]  Subjective/Objective: Spoke with patient for discharge planning. Patient alert and oriented from home with no DME or equipment. Ambulates without difficulty. Denies issues with filling RX.  Does not drive and relies on mother to help take her to appointments.    No CM need identified.                Action/Plan:    Home with self care.   Expected Discharge Date:                  Expected Discharge Plan:  Home/Self Care  In-House Referral:     Discharge planning Services  CM Consult  Post Acute Care Choice:  NA Choice offered to:  NA  DME Arranged:  N/A DME Agency:     HH Arranged:    [place] Agency:     Status of Service:  Completed, signed off  Medicare Important Message Given:    Date Medicare IM Given:    Medicare IM give by:    Date Additional Medicare IM Given:    Additional Medicare Important Message give by:     If discussed at [place] of Stay Meetings, dates discussed:    Additional Comments:  Alvie Heidelberg, RN 07/28/2015, 10:32 AM

## 2015-07-29 DIAGNOSIS — F1010 Alcohol abuse, uncomplicated: Secondary | ICD-10-CM

## 2015-07-29 DIAGNOSIS — R600 Localized edema: Secondary | ICD-10-CM

## 2015-07-29 LAB — CBC
HCT: 28.4 % — ABNORMAL LOW (ref 36.0–46.0)
HEMOGLOBIN: 9.7 g/dL — AB (ref 12.0–15.0)
MCH: 31.7 pg (ref 26.0–34.0)
MCHC: 34.2 g/dL (ref 30.0–36.0)
MCV: 92.8 fL (ref 78.0–100.0)
Platelets: 109 10*3/uL — ABNORMAL LOW (ref 150–400)
RBC: 3.06 MIL/uL — ABNORMAL LOW (ref 3.87–5.11)
RDW: 14.2 % (ref 11.5–15.5)
WBC: 8.6 10*3/uL (ref 4.0–10.5)

## 2015-07-29 LAB — BASIC METABOLIC PANEL
ANION GAP: 3 — AB (ref 5–15)
BUN: 31 mg/dL — AB (ref 6–20)
CALCIUM: 8.5 mg/dL — AB (ref 8.9–10.3)
CO2: 27 mmol/L (ref 22–32)
Chloride: 104 mmol/L (ref 101–111)
Creatinine, Ser: 1.56 mg/dL — ABNORMAL HIGH (ref 0.44–1.00)
GFR calc Af Amer: 46 mL/min — ABNORMAL LOW (ref >60)
GFR, EST NON AFRICAN AMERICAN: 40 mL/min — AB (ref >60)
GLUCOSE: 99 mg/dL (ref 65–99)
Potassium: 4.5 mmol/L (ref 3.5–5.1)
Sodium: 134 mmol/L — ABNORMAL LOW (ref 135–145)

## 2015-07-29 MED ORDER — GABAPENTIN 100 MG PO CAPS: 100.0000 mg | ORAL_CAPSULE | Freq: Two times a day (BID) | ORAL | Status: DC

## 2015-07-29 MED ORDER — GABAPENTIN 100 MG PO CAPS
100.0000 mg | ORAL_CAPSULE | Freq: Two times a day (BID) | ORAL | Status: DC
  Administered 2015-07-29: 100 mg via ORAL
  Filled 2015-07-29: qty 1

## 2015-07-29 MED ORDER — PANTOPRAZOLE SODIUM 40 MG PO TBEC: 40.0000 mg | DELAYED_RELEASE_TABLET | Freq: Every day | ORAL | Status: DC

## 2015-07-29 MED ORDER — TRAMADOL HCL 50 MG PO TABS: 25.0000 mg | ORAL_TABLET | Freq: Four times a day (QID) | ORAL | Status: DC | PRN

## 2015-07-29 NOTE — Progress Notes (Signed)
Subjective: Feels like she's getting better. Swelling in her ankles is improved. Took NSAIDs for knee and foot swelling and pain. Last bowel movement yesterday, no blood or dark stools. Denies abdominal pain, N/V. No other upper or lower GI complaints at this time. Is having shoulder soreness/stiffness.  Objective: Vital signs in last 24 hours: Temp:  [98.1 F (36.7 C)-98.6 F (37 C)] 98.6 F (37 C) (06/01 0513) Pulse Rate:  [79-90] 90 (06/01 0513) Resp:  [18-20] 20 (06/01 0513) BP: (97-109)/(61-62) 109/62 mmHg (06/01 0513) SpO2:  [100 %] 100 % (06/01 0513) Last BM Date: 07/28/15 General:   Alert and oriented, pleasant Head:  Normocephalic and atraumatic. Eyes:  No icterus, sclera clear. Conjuctiva pink.  Heart:  S1, S2 present, no murmurs noted.  Lungs: Clear to auscultation bilaterally, without wheezing, rales, or rhonchi.  Abdomen:  Bowel sounds present, rounded but soft, non-tender, non-distended. No rebound or guarding. Msk:  Symmetrical without gross deformities. Pulses:  Normal bilateral DP pulses noted. Extremities:  Trace bilateral ankle edema. Neurologic:  Alert and  oriented x4;  grossly normal neurologically.  Psych:  Alert and cooperative. Normal mood and affect.  Intake/Output from previous day: 05/31 0701 - 06/01 0700 In: 1299.2 [P.O.:480; I.V.:819.2] Out: -  Intake/Output this shift: Total I/O In: 240 [P.O.:240] Out: -   Lab Results:  Recent Labs  07/27/15 1920 07/28/15 0411 07/29/15 0600  WBC 13.8* 12.1* 8.6  HGB 11.3* 9.8* 9.7*  HCT 32.5* 28.5* 28.4*  PLT 134* 124* 109*   BMET  Recent Labs  07/27/15 1920 07/28/15 0411 07/29/15 0600  NA 133* 134* 134*  K 4.1 3.9 4.5  CL 100* 101 104  CO2 26 25 27   GLUCOSE 83 84 99  BUN 43* 44* 31*  CREATININE 2.45* 2.16* 1.56*  CALCIUM 9.6 9.0 8.5*   LFT  Recent Labs  07/27/15 1920  PROT 7.9  ALBUMIN 3.1*  AST 42*  ALT 23  ALKPHOS 61  BILITOT 1.7*   PT/INR  Recent Labs   07/27/15 1920  LABPROT 16.9*  INR 1.36   Hepatitis Panel No results for input(s): HEPBSAG, HCVAB, HEPAIGM, HEPBIGM in the last 72 hours.   Studies/Results: Dg Chest 2 View  07/27/2015  CLINICAL DATA:  Lower extremity edema, fatigue and abdominal distension. EXAM: CHEST  2 VIEW COMPARISON:  Chest x-rays dated 02/17/2015 and 12/27/2011. FINDINGS: Heart size is normal. Overall cardiomediastinal silhouette is normal in size and configuration. Lungs are clear. No evidence of pneumonia. No pleural effusion or pneumothorax seen. Osseous and soft tissue structures about the chest are unremarkable. IMPRESSION: Lungs are clear and there is no evidence of acute cardiopulmonary abnormality. Electronically Signed   By: Franki Cabot M.D.   On: 07/27/2015 19:27    Assessment: 45 year old female with a history of ETOH cirrhosis. Last office note 06/14/15 stated patient had relapsed and was asking about several different drinks and if she was able to drink them. History of anasarca recently which was brough under control with po diruesis, has been weight stable for 2-3 months on Spironolactone 100 mg bid and Lasix 40 mg bid. Previous office note states if her diuretics are altered she begins to have worsening edema.   Admitted with heme+ stool and increase Cr. Per Dr. Laural Golden who kindly saw this patient consult for [place] initially, both likely due to recent frequent use of NSAIDs. Over the past 5-6 months has had significant improvement in bilirubin, increase in albumin. Current MELD 23 with Cr  2.45 (2 days ago) and 18 using Cr  1.56 (today). Child-Pugh A.  Today she's clinically improved. Feeling better, no recurrent GI bleed. Hgb today 9.7 (stable from yesterday and within baseline of the past 5 months). Edema improved. Currently with trace ankle edema, no tense ascites. Cr significantly improved which fits picture of NSAID effect. Doubtful hepatorenal syndrome.    Plan: 1. Continue to monitor H/H and for overt  GI bleed 2. When ready for discharge can restart previous dose of diuretics: Spironolactone 100 mg and furosemide 40 mg bid.  3. Outpatient GI follow-up in 2-4 weeks 4. Avoid ALL forms of ETOH 5. Supportive measures 6. Appears stable for discharge from a GI standpoint.    Walden Field, AGNP-C Adult & Gerontological Nurse Practitioner [facility]    LOS: 1 day    07/29/2015, 1:27 PM    Discussed with Dr. Cruzita Lederer

## 2015-07-29 NOTE — Discharge Summary (Addendum)
Physician Discharge Summary  Whitney Bush [id number] DOB: [birth year] DOA: 07/27/2015  PCP: Shade Flood, MD  Admit date: 07/27/2015 Discharge date: 07/29/2015  Time spent: >30 minutes  Recommendations for Outpatient Follow-up:  1. Follow up with GI in 1 week 2. Repeat BMP and CBC at follow up  Discharge Diagnoses:  Principal Problem:   GI bleed Active Problems:   Alcohol abuse   Rectal bleeding   Boil   GI bleeding   GI (gastrointestinal bleed)   Bilateral edema of lower extremity   Guaiac positive stools   Discharge Condition: stable  Diet recommendation: low salt  Filed Weights   07/27/15 1437 07/28/15 0244  Weight: 90.266 kg (199 lb) 89.495 kg (197 lb 4.8 oz)    History of present illness:  See H&P, Labs, Consult and Test reports for all details in brief, patient is a 45 y.o. female with medical history significant of lower extremity edema, alcoholic cirrhosis presented with complaints of increasing lower extremity edema and abdominal distention over the past month. She reports that she has been compliant with her Lasix, but has been urinating less. She also reports associated shortness of breath with exertion and difficulty breathing while lying supine. She denies any nausea, vomiting, diarrhea, melena, hematochezia, chills, cough, or chest pain.   Hospital Course:  Upper GI Bleeding - GI consulted and followed patient while hospitalized. This is likely due to her excessive NSAID use at home. She was placed on Protonix with clinical resolution of her bleeding. Discussed with Dr. 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 Bilateral neuropathic foot  pain - main reason why she was taking NSAIDs. Started on low dose gabapentin, monitor as an outpatient and up-titrate if needed  Procedures:  None   Consultations:  Gastroenterology   Discharge Exam: Filed Vitals:   07/28/15 2055 07/28/15 2208 07/29/15 0513 07/29/15 1412  BP: 98/62  109/62 105/62  Pulse: 79  90 80  Temp: 98.1 F (36.7 C)  98.6 F (37 C) 98.3 F (36.8 C)  TempSrc: Oral  Oral Oral  Resp: 20  20 20   Height:      Weight:      SpO2: 100% 100% 100% 100%    General: NAD Cardiovascular: RRR Respiratory: CTA biL  Discharge Instructions Activity:  As tolerated   Get Medicines reviewed and adjusted: Please take all your medications with you for your next visit with your Primary MD  Please request your Primary MD to go over all hospital tests and procedure/radiological results at the follow up, please ask your Primary MD to get all Hospital records sent to his/her office.  If you experience worsening of your admission symptoms, develop shortness of breath, life threatening emergency, suicidal or homicidal thoughts you must seek medical attention immediately by calling 911 or calling your MD immediately if symptoms less severe.  You must read complete instructions/literature along with all the possible adverse reactions/side effects for all the Medicines you take and that have been prescribed to you. Take any new Medicines after you have completely understood and accpet all the possible adverse reactions/side effects.   Do not drive when taking Pain medications.   Do not take more than  prescribed Pain, Sleep and Anxiety Medications  Special Instructions: If you have smoked or chewed Tobacco in the last 2 yrs please stop smoking, stop any regular Alcohol and or any Recreational drug use.  Wear Seat belts while driving.  Please note  You were cared for by a hospitalist during your hospital stay. Once you are discharged, your primary care physician will  handle any further medical issues. Please note that NO REFILLS for any discharge medications will be authorized once you are discharged, as it is imperative that you return to your primary care physician (or establish a relationship with a primary care physician if you do not have one) for your aftercare needs so that they can reassess your need for medications and monitor your lab values.    Medication List    STOP taking these medications        doxycycline 100 MG capsule  Commonly known as:  VIBRAMYCIN      TAKE these medications        citalopram 20 MG tablet  Commonly known as:  CELEXA  Take 20 mg by mouth daily.     folic acid 1 MG tablet  Commonly known as:  FOLVITE  Take 1 tablet (1 mg total) by mouth daily.     furosemide 40 MG tablet  Commonly known as:  LASIX  Take 1 tablet (40 mg total) by mouth 2 (two) times daily.     gabapentin 100 MG capsule  Commonly known as:  NEURONTIN  Take 1 capsule (100 mg total) by mouth 2 (two) times daily.     lactulose 10 GM/15ML solution  Commonly known as:  CHRONULAC  Take 15 mLs (10 g total) by mouth 2 (two) times daily.     multivitamin with minerals Tabs tablet  Take 1 tablet by mouth daily.     pantoprazole 40 MG tablet  Commonly known as:  PROTONIX  Take 1 tablet (40 mg total) by mouth daily.     polyethylene glycol-electrolytes 420 g solution  Commonly known as:  NuLYTELY/GoLYTELY  Take 4,000 mLs by mouth once.     spironolactone 100 MG tablet  Commonly known as:  ALDACTONE  Take 1 tablet (100 mg total) by mouth 2 (two) times daily.     traMADol 50 MG tablet  Commonly known as:  ULTRAM  Take 0.5 tablets (25 mg total) by mouth every 6 (six) hours as needed for severe pain.     VITAMIN B-COMPLEX PO  Take 1 tablet by mouth daily.     vitamin E 400 UNIT capsule  Take 400 Units by mouth daily.       Follow-up Information    Follow up with Neil Crouch, PA-C. Schedule an appointment as soon as possible for a  visit in 1 week.   Specialty:  Gastroenterology   Contact information:   [address] [place] [address] [phone]       The results of significant diagnostics from this hospitalization (including imaging, microbiology, ancillary and laboratory) are listed below for reference.    Significant Diagnostic Studies: Dg Chest 2 View  07/27/2015  CLINICAL DATA:  Lower extremity edema, fatigue and abdominal distension. EXAM: CHEST  2 VIEW COMPARISON:  Chest x-rays dated 02/17/2015 and 12/27/2011. FINDINGS: Heart size is normal. Overall cardiomediastinal silhouette is normal in size and configuration. Lungs are clear. No evidence of pneumonia. No pleural effusion or pneumothorax seen. Osseous and soft tissue structures about the chest  are unremarkable. IMPRESSION: Lungs are clear and there is no evidence of acute cardiopulmonary abnormality. Electronically Signed   By: Franki Cabot M.D.   On: 07/27/2015 19:27    Microbiology: No results found for this or any previous visit (from the past 240 hour(s)).   Labs: Basic Metabolic Panel:  Recent Labs Lab 07/27/15 1920 07/28/15 0411 07/29/15 0600  NA 133* 134* 134*  K 4.1 3.9 4.5  CL 100* 101 104  CO2 26 25 27   GLUCOSE 83 84 99  BUN 43* 44* 31*  CREATININE 2.45* 2.16* 1.56*  CALCIUM 9.6 9.0 8.5*   Liver Function Tests:  Recent Labs Lab 07/27/15 1920  AST 42*  ALT 23  ALKPHOS 61  BILITOT 1.7*  PROT 7.9  ALBUMIN 3.1*   No results for input(s): LIPASE, AMYLASE in the last 168 hours. No results for input(s): AMMONIA in the last 168 hours. CBC:  Recent Labs Lab 07/27/15 1920 07/28/15 0411 07/29/15 0600  WBC 13.8* 12.1* 8.6  NEUTROABS 8.8*  --   --   HGB 11.3* 9.8* 9.7*  HCT 32.5* 28.5* 28.4*  MCV 91.0 91.3 92.8  PLT 134* 124* 109*   Cardiac Enzymes: No results for input(s): CKTOTAL, CKMB, CKMBINDEX, TROPONINI in the last 168 hours. BNP: BNP (last 3 results)  Recent Labs   02/16/15 1555 07/27/15 1920  BNP 242.0* 29.0    ProBNP (last 3 results) No results for input(s): PROBNP in the last 8760 hours.  CBG: No results for input(s): GLUCAP in the last 168 hours.     SignedMarzetta Board  [facility] 07/29/2015, 5:12 PM

## 2015-07-29 NOTE — Discharge Instructions (Signed)
Follow with Shade Flood, MD in 5-7 days  Please get a complete blood count and chemistry panel checked by your Primary MD at your next visit, and again as instructed by your Primary MD. Please get your medications reviewed and adjusted by your Primary MD.  Please request your Primary MD to go over all Hospital Tests and Procedure/Radiological results at the follow up, please get all Hospital records sent to your Prim MD by signing hospital release before you go home.  If you had Pneumonia of Lung problems at the Hospital: Please get a 2 view Chest X ray done in 6-8 weeks after hospital discharge or sooner if instructed by your Primary MD.  If you have Congestive Heart Failure: Please call your Cardiologist or Primary MD anytime you have any of the following symptoms:  1) 3 pound weight gain in 24 hours or 5 pounds in 1 week  2) shortness of breath, with or without a dry hacking cough  3) swelling in the hands, feet or stomach  4) if you have to sleep on extra pillows at night in order to breathe  Follow cardiac low salt diet and 1.5 lit/day fluid restriction.  If you have diabetes Accuchecks 4 times/day, Once in AM empty stomach and then before each meal. Log in all results and show them to your primary doctor at your next visit. If any glucose reading is under 80 or above 300 call your primary MD immediately.  If you have Seizure/Convulsions/Epilepsy: Please do not drive, operate heavy machinery, participate in activities at heights or participate in high speed sports until you have seen by Primary MD or a Neurologist and advised to do so again.  If you had Gastrointestinal Bleeding: Please ask your Primary MD to check a complete blood count within one week of discharge or at your next visit. Your endoscopic/colonoscopic biopsies that are pending at the time of discharge, will also need to followed by your Primary MD.  Get Medicines reviewed and adjusted. Please take all your  medications with you for your next visit with your Primary MD  Please request your Primary MD to go over all hospital tests and procedure/radiological results at the follow up, please ask your Primary MD to get all Hospital records sent to his/her office.  If you experience worsening of your admission symptoms, develop shortness of breath, life threatening emergency, suicidal or homicidal thoughts you must seek medical attention immediately by calling 911 or calling your MD immediately  if symptoms less severe.  You must read complete instructions/literature along with all the possible adverse reactions/side effects for all the Medicines you take and that have been prescribed to you. Take any new Medicines after you have completely understood and accpet all the possible adverse reactions/side effects.   Do not drive or operate heavy machinery when taking Pain medications.   Do not take more than prescribed Pain, Sleep and Anxiety Medications  Special Instructions: If you have smoked or chewed Tobacco  in the last 2 yrs please stop smoking, stop any regular Alcohol  and or any Recreational drug use.  Wear Seat belts while driving.  Please note You were cared for by a hospitalist during your hospital stay. If you have any questions about your discharge medications or the care you received while you were in the hospital after you are discharged, you can call the unit and asked to speak with the hospitalist on call if the hospitalist that took care of you is not available.  Once you are discharged, your primary care physician will handle any further medical issues. Please note that NO REFILLS for any discharge medications will be authorized once you are discharged, as it is imperative that you return to your primary care physician (or establish a relationship with a primary care physician if you do not have one) for your aftercare needs so that they can reassess your need for medications and monitor your  lab values.  You can reach the hospitalist office at phone [phone] or fax [phone]   If you do not have a primary care physician, you can call [phone] for a physician referral.  Activity: As tolerated with Full fall precautions use walker/cane & assistance as needed  Diet: low salt  Disposition Home

## 2015-08-16 ENCOUNTER — Other Ambulatory Visit: Payer: Self-pay

## 2015-08-16 MED ORDER — SPIRONOLACTONE 100 MG PO TABS: 100.0000 mg | ORAL_TABLET | Freq: Two times a day (BID) | ORAL | Status: DC

## 2015-08-16 MED ORDER — FUROSEMIDE 40 MG PO TABS: 40.0000 mg | ORAL_TABLET | Freq: Two times a day (BID) | ORAL | Status: DC

## 2015-09-27 ENCOUNTER — Other Ambulatory Visit: Payer: Self-pay

## 2015-10-12 ENCOUNTER — Telehealth: Payer: Self-pay | Admitting: Internal Medicine

## 2015-10-12 NOTE — Telephone Encounter (Signed)
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 She is wanting me to fax letter to her lawyer because she has court tomorrow. I told her that I wouldn't think that we would've been treating her for nerve damage but she said that we had seen her while in the hospital. Pt can be reached at home number [phone]

## 2015-10-12 NOTE — Telephone Encounter (Signed)
Routing to EG, who saw the pt on 07/29/15 in the hospital.

## 2015-10-13 NOTE — Telephone Encounter (Signed)
FYI, forwarding this to you per our discussion.

## 2015-10-13 NOTE — Telephone Encounter (Signed)
I tried to call the patient, no answer,lmom 

## 2015-10-26 LAB — PROTIME-INR
INR: 1.2 — ABNORMAL HIGH
PROTHROMBIN TIME: 12.7 s — AB (ref 9.0–11.5)

## 2015-10-26 LAB — COMPREHENSIVE METABOLIC PANEL
ALBUMIN: 3.4 g/dL — AB (ref 3.6–5.1)
ALT: 14 U/L (ref 6–29)
AST: 25 U/L (ref 10–30)
Alkaline Phosphatase: 70 U/L (ref 33–115)
BILIRUBIN TOTAL: 1.1 mg/dL (ref 0.2–1.2)
BUN: 29 mg/dL — ABNORMAL HIGH (ref 7–25)
CO2: 28 mmol/L (ref 20–31)
CREATININE: 1.98 mg/dL — AB (ref 0.50–1.10)
Calcium: 9.4 mg/dL (ref 8.6–10.2)
Chloride: 96 mmol/L — ABNORMAL LOW (ref 98–110)
GLUCOSE: 123 mg/dL — AB (ref 65–99)
Potassium: 3.5 mmol/L (ref 3.5–5.3)
SODIUM: 135 mmol/L (ref 135–146)
Total Protein: 8.2 g/dL — ABNORMAL HIGH (ref 6.1–8.1)

## 2015-10-27 NOTE — Progress Notes (Signed)
Her Creatinine and BUN are up. Possibly related to diuretics. Her MELD 17 in this setting. Stable from before.   Please find out what diuretics and dosages she is on. Please find out if she is still having swelling in your legs. She needs OV with RMR only next available.

## 2015-10-29 ENCOUNTER — Encounter: Payer: Self-pay | Admitting: Internal Medicine

## 2015-11-04 NOTE — Progress Notes (Signed)
I would recommend she decrease her Lasix to 40 mg daily and spironolactone 100 mg daily. Continue to monitor weight, if increases more than 5 pounds, let us know. Repeat met 7 in 2 weeks. Keep office visit with Dr. Gala Romney.

## 2015-11-15 ENCOUNTER — Other Ambulatory Visit: Payer: Self-pay | Admitting: Gastroenterology

## 2015-11-15 ENCOUNTER — Other Ambulatory Visit: Payer: Self-pay

## 2015-11-15 DIAGNOSIS — K7030 Alcoholic cirrhosis of liver without ascites: Secondary | ICD-10-CM

## 2015-11-23 ENCOUNTER — Other Ambulatory Visit: Payer: Self-pay

## 2015-11-23 ENCOUNTER — Ambulatory Visit (INDEPENDENT_AMBULATORY_CARE_PROVIDER_SITE_OTHER): Payer: Medicaid Other | Admitting: Internal Medicine

## 2015-11-23 VITALS — BP 108/68 | HR 90 | Temp 98.4°F | Ht 62.0 in | Wt 204.4 lb

## 2015-11-23 DIAGNOSIS — K7460 Unspecified cirrhosis of liver: Secondary | ICD-10-CM

## 2015-11-23 DIAGNOSIS — Z7689 Persons encountering health services in other specified circumstances: Secondary | ICD-10-CM

## 2015-11-23 DIAGNOSIS — K7031 Alcoholic cirrhosis of liver with ascites: Secondary | ICD-10-CM | POA: Diagnosis not present

## 2015-11-23 MED ORDER — FUROSEMIDE 40 MG PO TABS: 40.0000 mg | ORAL_TABLET | Freq: Every day | ORAL | 11 refills | Status: DC

## 2015-11-23 NOTE — Patient Instructions (Signed)
Resume Lasix at a dose of 40 mg daily  Aldactone 100 mg once daily  Continue 2 g sodium diet.  Continue lactulose  Alcohol avoidance.  We will refer you to a primary care physician in within [facility]  Re-establish [facility] transplant clinic appointment  B-MET in 2 weeks  Office visit in 6 weeks

## 2015-11-23 NOTE — Progress Notes (Signed)
Primary Care Physician:  Shade Flood, MD Primary Gastroenterologist:  Dr. Gala Romney  Pre-Procedure History & Physical: HPI:  Whitney Bush is a 45 y.o. female here for EtOH related cirrhosis/anasarca. Did not yet go to [facility] because of no insurance. Patient reports now has Medicaid and will reestablish a [facility] transplant clinic appointment.  Patient ran out of her decreased dose of Lasix (i.e. 40 mg daily) several days ago. Has gained 15 pounds have swollen hands and feet. Tells me she is not drinking alcohol. We discussed avoidance of alcohol in all of its forms. History of colonic adenoma;  prior colonoscopy earlier this year. Had some skin boils on her shoulders for which she was treated here with doxycycline. They have diminished but not totally resolved. She is having a hard time finding a primary care physician. Is scheduled to have a BMET done tomorrow but again has not been on her diuretic therapy. Lasix was cut down from 40 twice a day to 40 daily because of her creatinine and BUN. Aldactone decreased to 100 mg daily. GERD well controlled on Protonix 40 mg daily.   Past Medical History:  Diagnosis Date  . Alcohol abuse   . 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 Anemia   . Depression   . Hypertension   . Migraines   . Peripheral edema   . Tubular adenoma     Past Surgical History:  Procedure Laterality Date  . ABCESS DRAINAGE     x5; neck, arm, chest, back  . BIOPSY  02/24/2015   Procedure: BIOPSY;  Surgeon: Danie Binder, MD;  Location: [facility];  Service: Endoscopy;;  gastric bx's  . CESAREAN SECTION    . COLONOSCOPY WITH PROPOFOL N/A 06/28/2015   Dr.Rakeem Colley- inadequate prep- One 6 mm polyp at the splenic flexure bx= tubular adenoma  . ESOPHAGOGASTRODUODENOSCOPY (EGD) WITH PROPOFOL N/A 02/24/2015   SLF: Grade II esophageal varices Moderate portal hypertensive gastropathy Mild erosive gastritis anemia likely due to many factors:  gastritis, gastropathy, coagulopathy, chronic disease  . POLYPECTOMY  06/28/2015   Procedure: POLYPECTOMY;  Surgeon: Daneil Dolin, MD;  Location: [facility];  Service: Endoscopy;;  at splenic flexure    Prior to Admission medications   Medication Sig Start Date End Date Taking? Authorizing Provider  B Complex Vitamins (VITAMIN B-COMPLEX PO) Take 1 tablet by mouth daily.   Yes Historical Provider, MD  citalopram (CELEXA) 20 MG tablet Take 20 mg by mouth daily.   Yes Historical Provider, MD  folic acid (FOLVITE) 1 MG tablet Take 1 tablet (1 mg total) by mouth daily. 12/05/14  Yes Estela Leonie Green, MD  furosemide (LASIX) 40 MG tablet Take 1 tablet (40 mg total) by mouth 2 (two) times daily. 08/16/15  Yes Mahala Menghini, PA-C  gabapentin (NEURONTIN) 100 MG capsule Take 1 capsule (100 mg total) by mouth 2 (two) times daily. 07/29/15  Yes Costin Karlyne Greenspan, MD  lactulose (CHRONULAC) 10 GM/15ML solution Take 15 mLs (10 g total) by mouth 2 (two) times daily. 06/04/15  Yes Mahala Menghini, PA-C  Multiple Vitamin (MULTIVITAMIN WITH MINERALS) TABS tablet Take 1 tablet by mouth daily. 12/05/14  Yes Erline Hau, MD  pantoprazole (PROTONIX) 40 MG tablet Take 1 tablet (40 mg total) by mouth daily. 07/29/15  Yes Costin Karlyne Greenspan, MD  spironolactone (ALDACTONE) 100 MG tablet Take 1 tablet (100 mg total) by mouth  2 (two) times daily. 08/16/15  Yes Mahala Menghini, PA-C  traMADol (ULTRAM) 50 MG tablet Take 0.5 tablets (25 mg total) by mouth every 6 (six) hours as needed for severe pain. 07/29/15  Yes Costin Karlyne Greenspan, MD  vitamin E 400 UNIT capsule Take 400 Units by mouth daily.   Yes Historical Provider, MD  polyethylene glycol-electrolytes (NULYTELY/GOLYTELY) 420 g solution Take 4,000 mLs by mouth once. Patient not taking: Reported on 11/23/2015 06/04/15   Mahala Menghini, PA-C    Allergies as of 11/23/2015  . (No Known Allergies)    Family History  Problem Relation Age of Onset  . Diabetes Father    . Cancer Other   . Cervical cancer Maternal Grandmother   . Lung cancer Maternal Grandfather   . Alcohol abuse Other     multiple family members  . Colon cancer Neg Hx   . Liver disease Neg Hx     Social History   Social History  . Marital status: Widowed    Spouse name: N/A  . Number of children: 1  . Years of education: N/A   Occupational History  . unemployed    Social History Main Topics  . Smoking status: Former Smoker    Packs/day: 1.00    Years: 5.00    Types: Cigarettes    Quit date: 12/29/2010  . Smokeless tobacco: Former Systems developer    Types: Snuff  . Alcohol use 2.4 oz/week    4 Glasses of wine per week     Comment: occassional, vodka historically as well. Patient states she quit 12/2014 but relapse 04/2015; pt sts none since 04/2015 on 06/23/2015.  . Drug use: No  . Sexual activity: Yes    Birth control/ protection: None   Other Topics Concern  . Not on file   Social History Narrative  . No narrative on file    Review of Systems: See HPI, otherwise negative ROS  Physical Exam: BP 108/68   Pulse 90   Temp 98.4 F (36.9 C) (Oral)   Ht _0  (1.575 m)   Wt 204 lb 6.4 oz (92.7 kg)   BMI 37.39 kg/m  General:    pleasant and cooperative in NAD Neck:  Supple; no masses or thyromegaly. No significant cervical adenopathy. Lungs:  Clear throughout to auscultation.   No wheezes, crackles, or rhonchi. No acute distress. Heart:  Regular rate and rhythm; no murmurs, clicks, rubs,  or gallops. Abdomen: full. Positive bowel sounds soft with minimal diffuse tenderness. No obvious fluid labor shifting dullness.  Pulses:  Normal pulses noted. Extremities:  1+ LE edema  Impression:   Pleasant 89 Lady with EtOH related cirrhosis and secondary anasarca. She's off of Lasix now for several days  -  she's gained 15 pounds. Follow-up lab work pending not yet done; we'll hold on labs another 2 weeks due to her being off diuretics. Reviewed the need to avoid all alcohol in all  forms. States she's not consuming alcohol and this time. Still has some issues with skin boils on her shoulders now after doxycycline prescribed through this office. No primary care physician. GERD well controlled on Protonix. Lactulose maintaining bowel function.  History of colonic adenoma   Recommendations:    Resume Lasix at a dose of 40 mg daily  Aldactone 100 mg once daily  Continue Protonix 40 mg daily  Continue 2 g sodium diet.  Continue lactulose  Alcohol avoidance.  We will refer you to a primary care physician in  within  medical group  Re-establish [facility] transplant clinic appointment  B-MET in 2 weeks  Repeat colonoscopy in 5 years  Office visit in 6 weeks   Notice: This dictation was prepared with Dragon dictation along with smaller phrase technology. Any transcriptional errors that result from this process are unintentional and may not be corrected upon review.

## 2015-12-02 ENCOUNTER — Ambulatory Visit (INDEPENDENT_AMBULATORY_CARE_PROVIDER_SITE_OTHER): Payer: Medicaid Other | Admitting: Family Medicine

## 2015-12-02 ENCOUNTER — Encounter: Payer: Self-pay | Admitting: Family Medicine

## 2015-12-02 VITALS — BP 108/80 | HR 84 | Ht 62.0 in | Wt 202.0 lb

## 2015-12-02 DIAGNOSIS — N179 Acute kidney failure, unspecified: Secondary | ICD-10-CM

## 2015-12-02 DIAGNOSIS — Z23 Encounter for immunization: Secondary | ICD-10-CM | POA: Diagnosis not present

## 2015-12-02 DIAGNOSIS — K7031 Alcoholic cirrhosis of liver with ascites: Secondary | ICD-10-CM

## 2015-12-02 DIAGNOSIS — I10 Essential (primary) hypertension: Secondary | ICD-10-CM | POA: Diagnosis not present

## 2015-12-02 MED ORDER — LACTULOSE 10 GM/15ML PO SOLN: 10.0000 g | Freq: Two times a day (BID) | ORAL | 3 refills | Status: DC

## 2015-12-02 MED ORDER — PANTOPRAZOLE SODIUM 40 MG PO TBEC: 40.0000 mg | DELAYED_RELEASE_TABLET | Freq: Every day | ORAL | 2 refills | Status: DC

## 2015-12-02 MED ORDER — GABAPENTIN 100 MG PO CAPS: 100.0000 mg | ORAL_CAPSULE | Freq: Two times a day (BID) | ORAL | 0 refills | Status: DC

## 2015-12-02 NOTE — Progress Notes (Addendum)
Chief Complaint  Patient presents with  . Establish Care    states she has gained alot of weight recently due to fluid. GI had her on lasix 40 bid and it was recently cut to qd and she doesn't have any more of her spironolactone, and isn't really sure what all she is to be taking because she doesn't have most of what is on her medlist.   Here new to establish with this office Records reviewed and updated This is a pleasant 45 year old woman who is under the care of gastroenterology for alcoholic cirrhosis with ascites, peripheral edema. She is here at the request of her gastroenterologist to follow with primary care. She is currently out of many of her medications and is encouraged to go to  the pharmacy today to get back on spironolactone and lactulose Neurontin and pantoprazole. Her only complaint is management of her pedal edema, and management of her nodular acne. She is currently living with her parents. Her husband died in 02-11-2023 by cardiac event. She takes citalopram for depression and states is working reasonably well but "might not be strong enough". She is abstaining from alcohol. She states her last alcohol intake was in April 2017. She appears to understand the importance of not drinking alcohol, even a small amount, to protect her liver. She is also reminded not to take over-the-counter products without clearing with her doctor, especially NSAID and acetaminophen products. She tries to eat a well-balanced diet. She does have some difficulty reducing her salt intake. She is not up-to-date with health maintenance. She is scheduled with health dept. for a Pap smear and breast exam and mammogram next month. She is encouraged to keep this appointment. She is scheduled to see a dentist next month for her routine cleaning. She is due for lab work and is advised of this today She requests a letter supporting her application for disability in order to obtain subsidized housing. Is not  up-to-date with immunization. She is given a flu shot and Prevnar today. She'll receive a TD up at her next visit. I explained to her that prevention of infections will be important especially if she is considered for a liver transplant.    Patient Active Problem List   Diagnosis Date Noted  . Bilateral edema of lower extremity   . History of colonic polyps   . Alcoholic cirrhosis of liver with ascites ([place])   . Anasarca 02/16/2015  . Alcoholic hepatitis with ascites   . Ascites   . Bleeding gastrointestinal   . AKI (acute kidney injury) ([place]) 12/10/2014  . Volume overload 12/10/2014  . Cirrhosis ([place])   . Elevated bilirubin   . Nausea with vomiting   . Diarrhea   . Absolute anemia 12/02/2014  . Jaundice 12/02/2014  . Essential hypertension 12/02/2014  . Alcohol abuse 12/02/2014  . Abdominal pain 12/02/2014    Outpatient Encounter Prescriptions as of 12/02/2015  Medication Sig  . B Complex Vitamins (VITAMIN B-COMPLEX PO) Take 1 tablet by mouth daily.  . citalopram (CELEXA) 20 MG tablet Take 20 mg by mouth daily.  . folic acid (FOLVITE) 1 MG tablet Take 1 tablet (1 mg total) by mouth daily.  . furosemide (LASIX) 40 MG tablet Take 1 tablet (40 mg total) by mouth daily.  Marland Kitchen gabapentin (NEURONTIN) 100 MG capsule Take 1 capsule (100 mg total) by mouth 2 (two) times daily.  Marland Kitchen lactulose (CHRONULAC) 10 GM/15ML solution Take 15 mLs (10 g total) by mouth 2 (two) times daily.  Marland Kitchen  Multiple Vitamin (MULTIVITAMIN WITH MINERALS) TABS tablet Take 1 tablet by mouth daily. (Patient not taking: Reported on 12/02/2015)  . pantoprazole (PROTONIX) 40 MG tablet Take 1 tablet (40 mg total) by mouth daily.  Marland Kitchen spironolactone (ALDACTONE) 100 MG tablet Take 1 tablet (100 mg total) by mouth 2 (two) times daily. (Patient not taking: Reported on 12/02/2015)  . vitamin E 400 UNIT capsule Take 400 Units by mouth daily.   No facility-administered encounter medications on file as of 12/02/2015.     Past Medical  History:  Diagnosis Date  . Alcohol abuse   . 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 Anemia   . Blood transfusion without reported diagnosis   . Boil 06/04/2015  . Chronic kidney disease   . Depression   . GERD (gastroesophageal reflux disease)   . GI (gastrointestinal bleed) 07/28/2015  . Heart murmur   . Hyperlipidemia   . Hypertension   . Migraines   . Peripheral edema   . Tubular adenoma     Past Surgical History:  Procedure Laterality Date  . ABCESS DRAINAGE     x5; neck, arm, chest, back  . BIOPSY  02/24/2015   Procedure: BIOPSY;  Surgeon: Danie Binder, MD;  [facility];  Service: Endoscopy;;  gastric bx's  . CESAREAN SECTION    . COLONOSCOPY WITH PROPOFOL N/A 06/28/2015   Dr.Rourk- inadequate prep- One 6 mm polyp at the splenic flexure bx= tubular adenoma  . ESOPHAGOGASTRODUODENOSCOPY (EGD) WITH PROPOFOL N/A 02/24/2015   SLF: Grade II esophageal varices Moderate portal hypertensive gastropathy Mild erosive gastritis anemia likely due to many factors: gastritis, gastropathy, coagulopathy, chronic disease  . POLYPECTOMY  06/28/2015   Procedure: POLYPECTOMY;  Surgeon: Daneil Dolin, MD;  [facility];  Service: Endoscopy;;  at splenic flexure    Social History   Social History  . Marital status: Widowed    Spouse name: N/A  . Number of children: 1  . Years of education: 63   Occupational History  . unemployed    Social History Main Topics  . Smoking status: Former Smoker    Packs/day: 1.00    Years: 5.00    Types: Cigarettes    Quit date: 12/29/2010  . Smokeless tobacco: Former Systems developer    Types: Snuff  . Alcohol use No     Comment: Last drink April 2017  . Drug use: No  . Sexual activity: Not Currently    Birth control/ protection: None   Other Topics Concern  . Not on file   Social History Narrative   Lives with parents    Family History  Problem Relation Age of Onset  . Diabetes Father   .  Hyperlipidemia Father   . Alcohol abuse Father   . Cancer Other   . Cervical cancer Maternal Grandmother   . Lung cancer Maternal Grandfather   . Alcohol abuse Other     multiple family members  . Diabetes Sister   . Hypertension Brother   . Colon cancer Neg Hx   . Liver disease Neg Hx     Review of Systems  Constitutional: Positive for malaise/fatigue. Negative for chills, fever and weight loss.       Weight Fluctuates with fluid  HENT: Negative for congestion and hearing loss.   Eyes: Negative for blurred vision and pain.  Respiratory: Negative for cough and shortness of breath.   Cardiovascular: Positive for palpitations  and leg swelling. Negative for chest pain.       History of uncertain palpitations. Chronic leg swelling  Gastrointestinal: Positive for abdominal pain, heartburn and nausea. Negative for constipation and diarrhea.  Genitourinary: Negative for dysuria and frequency.       Irregular menses  Musculoskeletal: Negative for falls, joint pain and myalgias.  Skin:       Chronic recurring acne, boils  Neurological: Positive for tingling and sensory change. Negative for dizziness, seizures and headaches.       Tingling and burning in her feet  Psychiatric/Behavioral: Positive for depression and substance abuse. Negative for suicidal ideas. The patient is not nervous/anxious and does not have insomnia.        Currently abstaining from alcohol. Grief and depression from the loss of her husband   BP 108/80   Pulse 84   Ht 5\' 2"  (1.575 m)   Wt 202 lb (91.6 kg)   SpO2 100%   BMI 36.95 kg/m   Physical Exam  Constitutional: She is oriented to person, place, and time. She appears well-developed and well-nourished.  Quiet demeanor. Hesitant responses  HENT:  Head: Normocephalic and atraumatic.  Right Ear: External ear normal.  Left Ear: External ear normal.  Nose: Nose normal.  Mouth/Throat: Oropharynx is clear and moist.  Eyes: Conjunctivae are normal. Pupils are  equal, round, and reactive to light.  Neck: Normal range of motion. Neck supple. No JVD present.  Cardiovascular: Normal rate, regular rhythm, normal heart sounds and intact distal pulses.   Pulmonary/Chest: Effort normal and breath sounds normal. She has no rales.  Abdominal: Soft. Bowel sounds are normal. She exhibits distension.  Musculoskeletal: Normal range of motion. She exhibits edema.  Pitting edema both feet,  pes planus  Lymphadenopathy:    She has no cervical adenopathy.  Neurological: She is alert and oriented to person, place, and time.  Skin: Skin is warm and dry.  Multiple hyperpigmented nodules from old acne scars  Psychiatric: She has a normal mood and affect. Her behavior is normal. Thought content normal.   ASSESSMENT/PLAN:  1. Essential hypertension Controlled  2. Alcoholic cirrhosis of liver with ascites (HCC) Currently abstaining from alcohol. Not entirely compliant with medications, will pick up prescriptions today.  - COMPLETE METABOLIC PANEL WITH GFR - CBC with Differential/Platelet - Lipid panel - VITAMIN D 25 Hydroxy (Vit-D Deficiency, Fractures)  3. AKI (acute kidney injury) ([place]) Labs were ordered today. Careful fluid management discussed.  4. New to establish Reviewed health maintenance. Pap and mammogram pending. Dental care pending. Immunizations updated.  Patient Instructions  Per note from Dr Emerson Monte: "Resume Lasix at a dose of 40 mg daily  Aldactone 100 mg once daily  Continue 2 g sodium diet.  Continue lactulose  Alcohol avoidance."  You are due for blood work  medicines refilled  Flu shot and pneumonia shot today Due for tetanus booster  Get PAP and mammogram as scheduled through health department Get dental care as scheduled  See me in 1-2 months for follow up     Raylene Everts, MD

## 2015-12-02 NOTE — Patient Instructions (Addendum)
Per note from Dr Emerson Monte: "Resume Lasix at a dose of 40 mg daily  Aldactone 100 mg once daily  Continue 2 g sodium diet.  Continue lactulose  Alcohol avoidance."  You are due for blood work  medicines refilled  Flu shot and pneumonia shot today Due for tetanus booster  Get PAP and mammogram as scheduled through health department Get dental care as scheduled  See me in 1-2 months for follow up

## 2015-12-30 ENCOUNTER — Telehealth: Payer: Self-pay | Admitting: Family Medicine

## 2015-12-30 NOTE — Telephone Encounter (Signed)
No.  One or other

## 2015-12-30 NOTE — Telephone Encounter (Signed)
Called [facility], aware.

## 2015-12-30 NOTE — Telephone Encounter (Signed)
Pt is on protonix 40 mg daily, she told pharmacy she takes omeprazole as well?

## 2016-01-04 ENCOUNTER — Encounter: Payer: Self-pay | Admitting: Internal Medicine

## 2016-01-04 ENCOUNTER — Ambulatory Visit (INDEPENDENT_AMBULATORY_CARE_PROVIDER_SITE_OTHER): Payer: Medicaid Other | Admitting: Internal Medicine

## 2016-01-04 VITALS — BP 123/80 | HR 87 | Temp 97.9°F | Ht 63.0 in | Wt 204.6 lb

## 2016-01-04 DIAGNOSIS — K7031 Alcoholic cirrhosis of liver with ascites: Secondary | ICD-10-CM

## 2016-01-04 DIAGNOSIS — K219 Gastro-esophageal reflux disease without esophagitis: Secondary | ICD-10-CM

## 2016-01-04 MED ORDER — NADOLOL 40 MG PO TABS: 40.0000 mg | ORAL_TABLET | Freq: Every day | ORAL | 11 refills | Status: DC

## 2016-01-04 NOTE — Progress Notes (Signed)
Primary Care Physician:  Raylene Everts, MD Primary Gastroenterologist:  Dr. Gala Romney  Pre-Procedure History & Physical: HPI:  Whitney Bush is a 45 y.o. female here for EtOH related cirrhosis. She continues to not use any substances including alcohol. Saw Dr. Ahmed Prima at [facility] recently. Has not yet linked up with a substance abuse counselor. She scheduled to see the doctors at [facility] back next month.  She cites compliance with her medications. She has known esophageal varices. She is not on nonselective beta blocker as of yet.  Past Medical History:  Diagnosis Date  . Alcohol abuse   . 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 Anemia   . Blood transfusion without reported diagnosis   . Boil 06/04/2015  . Chronic kidney disease   . Depression   . GERD (gastroesophageal reflux disease)   . GI (gastrointestinal bleed) 07/28/2015  . Heart murmur   . Hyperlipidemia   . Hypertension   . Migraines   . Peripheral edema   . Tubular adenoma     Past Surgical History:  Procedure Laterality Date  . ABCESS DRAINAGE     x5; neck, arm, chest, back  . BIOPSY  02/24/2015   Procedure: BIOPSY;  Surgeon: Danie Binder, MD;  [facility];  Service: Endoscopy;;  gastric bx's  . CESAREAN SECTION    . COLONOSCOPY WITH PROPOFOL N/A 06/28/2015   Dr.Nataliah Hatlestad- inadequate prep- One 6 mm polyp at the splenic flexure bx= tubular adenoma  . ESOPHAGOGASTRODUODENOSCOPY (EGD) WITH PROPOFOL N/A 02/24/2015   SLF: Grade II esophageal varices Moderate portal hypertensive gastropathy Mild erosive gastritis anemia likely due to many factors: gastritis, gastropathy, coagulopathy, chronic disease  . POLYPECTOMY  06/28/2015   Procedure: POLYPECTOMY;  Surgeon: Daneil Dolin, MD;  [facility];  Service: Endoscopy;;  at splenic flexure    Prior to Admission medications   Medication Sig Start Date End Date Taking? Authorizing Provider  B Complex Vitamins (VITAMIN B-COMPLEX  PO) Take 1 tablet by mouth daily.   Yes Historical Provider, MD  citalopram (CELEXA) 20 MG tablet Take 20 mg by mouth daily.   Yes Historical Provider, MD  folic acid (FOLVITE) 1 MG tablet Take 1 tablet (1 mg total) by mouth daily. 12/05/14  Yes Erline Hau, MD  furosemide (LASIX) 40 MG tablet Take 1 tablet (40 mg total) by mouth daily. 11/23/15  Yes Daneil Dolin, MD  gabapentin (NEURONTIN) 100 MG capsule Take 1 capsule (100 mg total) by mouth 2 (two) times daily. 12/02/15  Yes Raylene Everts, MD  lactulose (CHRONULAC) 10 GM/15ML solution Take 15 mLs (10 g total) by mouth 2 (two) times daily. 12/02/15  Yes Raylene Everts, MD  Multiple Vitamin (MULTIVITAMIN WITH MINERALS) TABS tablet Take 1 tablet by mouth daily. 12/05/14  Yes Erline Hau, MD  Norgestimate-Ethinyl Estradiol Triphasic (TRI-LO-ESTARYLLA) 0.18/0.215/0.25 MG-25 MCG tab Take 1 tablet by mouth daily.   Yes Historical Provider, MD  pantoprazole (PROTONIX) 40 MG tablet Take 1 tablet (40 mg total) by mouth daily. 12/02/15  Yes Raylene Everts, MD  vitamin E 400 UNIT capsule Take 400 Units by mouth daily.   Yes Historical Provider, MD  spironolactone (ALDACTONE) 100 MG tablet Take 1 tablet (100 mg total) by mouth 2 (two) times daily. Patient not taking: Reported on 01/04/2016 08/16/15   Mahala Menghini, PA-C    Allergies as of 01/04/2016  . (  No Known Allergies)    Family History  Problem Relation Age of Onset  . Diabetes Father   . Hyperlipidemia Father   . Alcohol abuse Father   . Cancer Other   . Cervical cancer Maternal Grandmother   . Lung cancer Maternal Grandfather   . Alcohol abuse Other     multiple family members  . Diabetes Sister   . Hypertension Brother   . Colon cancer Neg Hx   . Liver disease Neg Hx     Social History   Social History  . Marital status: Widowed    Spouse name: N/A  . Number of children: 1  . Years of education: 60   Occupational History  . unemployed     Social History Main Topics  . Smoking status: Former Smoker    Packs/day: 1.00    Years: 5.00    Types: Cigarettes    Quit date: 12/29/2010  . Smokeless tobacco: Former Systems developer    Types: Snuff  . Alcohol use No     Comment: Last drink April 2017  . Drug use: No  . Sexual activity: Not Currently    Birth control/ protection: None   Other Topics Concern  . Not on file   Social History Narrative   Lives with parents    Review of Systems: See HPI, otherwise negative ROS  Physical Exam: BP 123/80   Pulse 87   Temp 97.9 F (36.6 C) (Oral)   Ht 5\' 3"  (1.6 m)   Wt 204 lb 9.6 oz (92.8 kg)   LMP 12/27/2015 (Approximate)   BMI 36.24 kg/m  General:   Alert,, pleasant and cooperative in NAD Skin:  Intact without significant lesions or rashes. Eyes:  Sclera clear, no icterus.   Conjunctiva pink. Mouth:  No deformity or lesions. Neck:  Supple; no masses or thyromegaly. No significant cervical adenopathy. Lungs:  Clear throughout to auscultation.   No wheezes, crackles, or rhonchi. No acute distress. Heart:  Regular rate and rhythm; no murmurs, clicks, rubs,  or gallops. Abdomen: Non-distended, normal bowel sounds.  Soft and nontender without appreciable mass or hepatosplenomegaly.  Pulses:  Normal pulses noted. Extremities:  Without clubbing or edema.  Impression:  45 year old lady with EtOH related cirrhosis. Commended on her abstinence. She needs third-party help as emphasized by [facility]. I strongly urged third-party help to decrease the chances of relapse.   She is to see Randalyn Rhea again next month. She should have an ultrasound every 6 months.  She needs be started on nonselective beta blocker.   Current symptoms well controlled on Protonix.  Recommendations:  Continue Spironolactone 100 mg twice daily  Continue lasix 40 mg daily  Continue lactulose daily  Continue Protonix daily  Continue 2 gram sodium diet  Add nadolol 40 mg daily to decrease chances of variceal  hemorrhage  OV with [place] January 2018  Information on 2 gram sodium diet        Notice: This dictation was prepared with Dragon dictation along with smaller phrase technology. Any transcriptional errors that result from this process are unintentional and may not be corrected upon review.

## 2016-01-04 NOTE — Patient Instructions (Addendum)
Continue Spironolactone 100 mg twice daily  Continue lasix 40 mg daily  Continue lactulose daily  Continue Protonix daily  Continue 2 gram sodium diet  Add nadolol 40 mg daily to decrease chances of variceal hemorrhage  OV with [place] January 2018  Information on 2 gram sodium diet

## 2016-01-07 ENCOUNTER — Telehealth: Payer: Self-pay

## 2016-01-07 NOTE — Telephone Encounter (Signed)
Received a fax from the pharmacy for Nadolol. Pt has [place] medicaid. They will not pay for Nadolol unless she has tried and failed propranolol. Is it ok to change?

## 2016-01-08 ENCOUNTER — Other Ambulatory Visit: Payer: Self-pay | Admitting: Family Medicine

## 2016-01-10 MED ORDER — PROPRANOLOL HCL 10 MG PO TABS: 10.0000 mg | ORAL_TABLET | Freq: Three times a day (TID) | ORAL | 11 refills | Status: DC

## 2016-01-10 NOTE — Telephone Encounter (Signed)
Yes. Call in prescription for Inderal 10 mg 3 times a day. Dispense 120. 11 refills.

## 2016-01-10 NOTE — Telephone Encounter (Signed)
rx has been sent to the pharmacy. #90 per AB.

## 2016-01-18 ENCOUNTER — Other Ambulatory Visit: Payer: Self-pay

## 2016-01-18 MED ORDER — GABAPENTIN 100 MG PO CAPS: 100.0000 mg | ORAL_CAPSULE | Freq: Two times a day (BID) | ORAL | 0 refills | Status: DC

## 2016-02-01 ENCOUNTER — Ambulatory Visit: Payer: Medicaid Other | Admitting: Family Medicine

## 2016-02-04 ENCOUNTER — Other Ambulatory Visit: Payer: Self-pay

## 2016-02-04 MED ORDER — SPIRONOLACTONE 100 MG PO TABS: 100.0000 mg | ORAL_TABLET | Freq: Two times a day (BID) | ORAL | 2 refills | Status: DC

## 2016-02-07 ENCOUNTER — Ambulatory Visit (INDEPENDENT_AMBULATORY_CARE_PROVIDER_SITE_OTHER): Payer: Self-pay | Admitting: Family Medicine

## 2016-02-07 ENCOUNTER — Encounter: Payer: Self-pay | Admitting: Family Medicine

## 2016-02-07 VITALS — BP 110/68 | HR 84 | Temp 98.6°F | Resp 18 | Ht 63.0 in | Wt 219.1 lb

## 2016-02-07 DIAGNOSIS — I10 Essential (primary) hypertension: Secondary | ICD-10-CM

## 2016-02-07 DIAGNOSIS — F32A Depression, unspecified: Secondary | ICD-10-CM | POA: Insufficient documentation

## 2016-02-07 DIAGNOSIS — K7031 Alcoholic cirrhosis of liver with ascites: Secondary | ICD-10-CM

## 2016-02-07 DIAGNOSIS — F4321 Adjustment disorder with depressed mood: Secondary | ICD-10-CM

## 2016-02-07 DIAGNOSIS — R600 Localized edema: Secondary | ICD-10-CM

## 2016-02-07 DIAGNOSIS — F329 Major depressive disorder, single episode, unspecified: Secondary | ICD-10-CM | POA: Insufficient documentation

## 2016-02-07 MED ORDER — SPIRONOLACTONE 100 MG PO TABS
100.0000 mg | ORAL_TABLET | Freq: Two times a day (BID) | ORAL | 2 refills | Status: DC
Start: 2016-02-07 — End: 2016-04-20

## 2016-02-07 MED ORDER — FLUOXETINE HCL 20 MG PO TABS: 20.0000 mg | ORAL_TABLET | Freq: Every day | ORAL | 3 refills | Status: DC

## 2016-02-07 MED ORDER — FOLIC ACID 1 MG PO TABS
1.0000 mg | ORAL_TABLET | Freq: Every day | ORAL | 1 refills | Status: DC
Start: 2016-02-07 — End: 2016-06-09

## 2016-02-07 NOTE — Patient Instructions (Signed)
Stay on the fluid pills  Stop citalopram Take the fluoxetine once a day instead  Call the counselor for appointment  See me in a couple of months  Call sooner for problems

## 2016-02-07 NOTE — Progress Notes (Signed)
Chief Complaint  Patient presents with  . Follow-up    2 month   Routine follow up had gained weight on the citalopram so discontinued Is tearful as she discusses her situation - husbands death and the need to live with her parents pending disability approval and getting her own place- being supported by her daughter. She agrees to go for counseling.  Agrees to try a different SSRI that does not cause weight gain.    She says she feels capable of following the low sodium diet recommendations.  Declines nutrition referral Is not compliant with diuretics, is out of spironolactone again She also is insistent that she does not drink any amount of alcohol since quitting in April.  She questions whether she needs another EGD - and is encouraged to follow her GI doctors advice.  She had her PAP and mammogram through the health department    Patient Active Problem List   Diagnosis Date Noted  . Situational depression 02/07/2016  . Bilateral edema of lower extremity   . History of colonic polyps   . Alcoholic cirrhosis of liver with ascites ([place])   . Anasarca 02/16/2015  . Alcoholic hepatitis with ascites   . Ascites   . Bleeding gastrointestinal   . AKI (acute kidney injury) ([place]) 12/10/2014  . Volume overload 12/10/2014  . Cirrhosis ([place])   . Elevated bilirubin   . Nausea with vomiting   . Diarrhea   . Absolute anemia 12/02/2014  . Jaundice 12/02/2014  . Essential hypertension 12/02/2014  . Alcohol abuse 12/02/2014  . Abdominal pain 12/02/2014    Outpatient Encounter Prescriptions as of 02/07/2016  Medication Sig  . B Complex Vitamins (VITAMIN B-COMPLEX PO) Take 1 tablet by mouth daily.  . folic acid (FOLVITE) 1 MG tablet Take 1 tablet (1 mg total) by mouth daily.  . furosemide (LASIX) 40 MG tablet Take 1 tablet (40 mg total) by mouth daily.  Marland Kitchen gabapentin (NEURONTIN) 100 MG capsule Take 1 capsule (100 mg total) by mouth 2 (two) times daily.  Marland Kitchen lactulose (CHRONULAC) 10  GM/15ML solution Take 15 mLs (10 g total) by mouth 2 (two) times daily.  . Multiple Vitamin (MULTIVITAMIN WITH MINERALS) TABS tablet Take 1 tablet by mouth daily.  . Norgestimate-Ethinyl Estradiol Triphasic (TRI-LO-ESTARYLLA) 0.18/0.215/0.25 MG-25 MCG tab Take 1 tablet by mouth daily.  . pantoprazole (PROTONIX) 40 MG tablet Take 1 tablet (40 mg total) by mouth daily.  . propranolol (INDERAL) 10 MG tablet Take 1 tablet (10 mg total) by mouth 3 (three) times daily.  Marland Kitchen spironolactone (ALDACTONE) 100 MG tablet Take 1 tablet (100 mg total) by mouth 2 (two) times daily.  . vitamin E 400 UNIT capsule Take 400 Units by mouth daily.  . Vitamins/Minerals TABS Take by mouth.  Marland Kitchen FLUoxetine (PROZAC) 20 MG tablet Take 1 tablet (20 mg total) by mouth daily.   No facility-administered encounter medications on file as of 02/07/2016.    No Known Allergies  Review of Systems  Constitutional: Positive for activity change, appetite change, fatigue and unexpected weight change.       Inactivity - gaining weight  HENT: Negative for congestion and dental problem.   Eyes: Negative for photophobia and visual disturbance.  Respiratory: Positive for shortness of breath. Negative for cough.        Short of breath with exertion  Cardiovascular: Positive for leg swelling. Negative for chest pain and palpitations.  Gastrointestinal: Negative for blood in stool, constipation and diarrhea.  Genitourinary: Negative  for difficulty urinating and menstrual problem.  Musculoskeletal: Positive for back pain.  Neurological: Negative for dizziness and headaches.  Psychiatric/Behavioral: Positive for decreased concentration, dysphoric mood and sleep disturbance. Negative for self-injury and suicidal ideas. The patient is nervous/anxious.    BP 110/68 (BP Location: Right Arm, Patient Position: Sitting, Cuff Size: Normal)   Pulse 84   Temp 98.6 F (37 C) (Oral)   Resp 18   Ht 5\' 3"  (1.6 m)   Wt 219 lb 1.9 oz (99.4 kg)   LMP  01/23/2016 (Exact Date)   SpO2 99%   BMI 38.82 kg/m   Physical Exam  Constitutional: She appears well-developed and well-nourished. She appears distressed.  tearful  HENT:  Head: Normocephalic and atraumatic.  Mouth/Throat: Oropharynx is clear and moist.  Eyes: Conjunctivae are normal. Pupils are equal, round, and reactive to light.  Neck: Normal range of motion. No thyromegaly present.  Cardiovascular: Normal rate, regular rhythm and normal heart sounds.   Pulmonary/Chest: Effort normal and breath sounds normal. She has no rales.  Abdominal: Soft. Bowel sounds are normal. She exhibits distension. There is no tenderness.  Musculoskeletal: She exhibits edema.  Lymphadenopathy:    She has no cervical adenopathy.  Psychiatric: Thought content normal.  Still refuses alcohol counseling Sad affect    ASSESSMENT/PLAN:  1. Situational depression Start fluoxetine Counseling recommended Daily walking as tolerated.  2. Alcoholic cirrhosis of liver with ascites ([place]) Follow with GI  3. Essential hypertension Controlled  4. Bilateral edema lower extremities Salt restriction Compliance with diuretics   Patient Instructions  Stay on the fluid pills  Stop citalopram Take the fluoxetine once a day instead  Call the counselor for appointment  See me in a couple of months  Call sooner for problems   Raylene Everts, MD

## 2016-02-08 ENCOUNTER — Ambulatory Visit: Payer: Medicaid Other | Admitting: Gastroenterology

## 2016-02-08 ENCOUNTER — Encounter: Payer: Self-pay | Admitting: Internal Medicine

## 2016-03-03 ENCOUNTER — Other Ambulatory Visit: Payer: Self-pay | Admitting: Family Medicine

## 2016-03-09 ENCOUNTER — Encounter: Payer: Self-pay | Admitting: Gastroenterology

## 2016-03-09 ENCOUNTER — Ambulatory Visit (INDEPENDENT_AMBULATORY_CARE_PROVIDER_SITE_OTHER): Payer: Medicaid Other | Admitting: Gastroenterology

## 2016-03-09 ENCOUNTER — Ambulatory Visit: Payer: Medicaid Other | Admitting: Gastroenterology

## 2016-03-09 VITALS — BP 123/80 | HR 81 | Temp 98.2°F | Ht 63.0 in | Wt 217.8 lb

## 2016-03-09 DIAGNOSIS — K7031 Alcoholic cirrhosis of liver with ascites: Secondary | ICD-10-CM

## 2016-03-09 DIAGNOSIS — R112 Nausea with vomiting, unspecified: Secondary | ICD-10-CM | POA: Diagnosis not present

## 2016-03-09 DIAGNOSIS — K219 Gastro-esophageal reflux disease without esophagitis: Secondary | ICD-10-CM | POA: Diagnosis not present

## 2016-03-09 NOTE — Progress Notes (Signed)
Primary Care Physician: Raylene Everts, MD  Primary Gastroenterologist:  Garfield Cornea, MD   Chief Complaint  Patient presents with  . Nausea    HPI: Whitney Bush is a 46 y.o. female here for follow up of ETOH related cirrhosis. Last seen in 12/2015 by Dr. Gala Romney. Due to be seen at [facility] liver clinic this month. Has not linked up with substance abuse counselor as recommended by [facility], requirement of 6 months of treatment as well as etoh cessation prior to being a candidate for liver transplantation. Patient tells me she's not sure if she will ever want a transplant and understands that right now she is stable and does not need one.   Patient had labs in 11/2015 at [facility]. Also abd u/s for hepatoma screening at that time. Her EGD was 01/2015 for anemia. She had two columns of grade II esophageal varices and portal gastropathy. No banding performed. Last colonoscopy, inadequate prep, tubular adenoma removed, next TCS 5 years.   Overall she feels good. She notes N/V couple of times day but admits she thinks its from the types of food she eats. Happens with fatty foods. GB looked ok on recent u/s. Some early satiety, cannot eat a whole cheeseburger at one sitting. Drinks sugary drinks. Weight gain but no evidence of significant fluid. She complains of tingling in legs with walking. No cramps. No confusion. No tremors. Lives with mother. bm 2-3 per day. No melena, brbpr.    01/2015 244 lb 05/2015 189 lb 10/2015 204 lb 02/2016 217 lb  Current Outpatient Prescriptions  Medication Sig Dispense Refill  . B Complex Vitamins (VITAMIN B-COMPLEX PO) Take 1 tablet by mouth daily.    Marland Kitchen FLUoxetine (PROZAC) 20 MG tablet Take 1 tablet (20 mg total) by mouth daily. 30 tablet 3  . folic acid (FOLVITE) 1 MG tablet Take 1 tablet (1 mg total) by mouth daily. 90 tablet 1  . furosemide (LASIX) 40 MG tablet Take 1 tablet (40 mg total) by mouth daily. 30 tablet 11  . gabapentin (NEURONTIN) 100 MG capsule  TAKE ONE CAPSULE BY MOUTH TWICE DAILY 180 capsule 1  . lactulose (CHRONULAC) 10 GM/15ML solution Take 15 mLs (10 g total) by mouth 2 (two) times daily. 946 mL 3  . Multiple Vitamin (MULTIVITAMIN WITH MINERALS) TABS tablet Take 1 tablet by mouth daily.    . Norgestimate-Ethinyl Estradiol Triphasic (TRI-LO-ESTARYLLA) 0.18/0.215/0.25 MG-25 MCG tab Take 1 tablet by mouth daily.    . pantoprazole (PROTONIX) 40 MG tablet Take 1 tablet (40 mg total) by mouth daily. 30 tablet 2  . propranolol (INDERAL) 10 MG tablet Take 1 tablet (10 mg total) by mouth 3 (three) times daily. 90 tablet 11  . spironolactone (ALDACTONE) 100 MG tablet Take 1 tablet (100 mg total) by mouth 2 (two) times daily. 60 tablet 2  . vitamin E 400 UNIT capsule Take 400 Units by mouth daily.    . Vitamins/Minerals TABS Take by mouth.     No current facility-administered medications for this visit.     Allergies as of 03/09/2016  . (No Known Allergies)    ROS:  General: Negative for anorexia, weight loss, fever, chills, fatigue, weakness. ENT: Negative for hoarseness, difficulty swallowing , nasal congestion. CV: Negative for chest pain, angina, palpitations, dyspnea on exertion, peripheral edema.  Respiratory: Negative for dyspnea at rest, dyspnea on exertion, cough, sputum, wheezing.  GI: See history of present illness. GU:  Negative for dysuria, hematuria, urinary incontinence,  urinary frequency, nocturnal urination.  Endo: Negative for unusual weight change.    Physical Examination:   BP 123/80   Pulse 81   Temp 98.2 F (36.8 C) (Oral)   Ht 5\' 3"  (1.6 m)   Wt 217 lb 12.8 oz (98.8 kg)   BMI 38.58 kg/m   General: Well-nourished, well-developed in no acute distress.  Eyes: No icterus. Mouth: Oropharyngeal mucosa moist and pink , no lesions erythema or exudate. Lungs: Clear to auscultation bilaterally.  Heart: Regular rate and rhythm, no murmurs rubs or gallops.  Abdomen: Bowel sounds are normal, nontender,  nondistended, no hepatosplenomegaly or masses, no abdominal bruits or hernia , no rebound or guarding.   Extremities: No lower extremity edema. No clubbing or deformities. Neuro: Alert and oriented x 4   Skin: Warm and dry, no jaundice.   Psych: Alert and cooperative, normal mood and affect.   Imaging Studies: No results found.

## 2016-03-09 NOTE — Progress Notes (Signed)
CC'ED TO PCP 

## 2016-03-09 NOTE — Patient Instructions (Signed)
1. Cut back on the spironolactone to ONCE daily. Continue lasix once daily. If you note worsening of fluid in your legs let me know. 2. Continue to follow a 2 gram sodium diet. You should avoid fatty and greasy foods. You should cut back on your soft drinks and juices as they have too many EMPTY calories.  3. If you continue to have vomiting with dietary changes, let me know. 4. Keep up on the good work with avoiding alcohol. 5. I will follow up on [facility] labs once they are done.    Low-Sodium Eating Plan Sodium raises blood pressure and causes water to be held in the body. Getting less sodium from food will help lower your blood pressure, reduce any swelling, and protect your heart, liver, and kidneys. We get sodium by adding salt (sodium chloride) to food. Most of our sodium comes from canned, boxed, and frozen foods. Restaurant foods, fast foods, and pizza are also very high in sodium. Even if you take medicine to lower your blood pressure or to reduce fluid in your body, getting less sodium from your food is important. What is my plan? Most people should limit their sodium intake to 2,300 mg a day. Your health care provider recommends that you limit your sodium intake to _______2000 mg___ a day. What do I need to know about this eating plan? For the low-sodium eating plan, you will follow these general guidelines:  Choose foods with a % Daily Value for sodium of less than 5% (as listed on the food label).  Use salt-free seasonings or herbs instead of table salt or sea salt.  Check with your health care provider or pharmacist before using salt substitutes.  Eat fresh foods.  Eat more vegetables and fruits.  Limit canned vegetables. If you do use them, rinse them well to decrease the sodium.  Limit cheese to 1 oz (28 g) per day.  Eat lower-sodium products, often labeled as "lower sodium" or "no salt added."  Avoid foods that contain monosodium glutamate (MSG). MSG is sometimes added to  [place] food and some canned foods.  Check food labels (Nutrition Facts labels) on foods to learn how much sodium is in one serving.  Eat more home-cooked food and less restaurant, buffet, and fast food.  When eating at a restaurant, ask that your food be prepared with less salt, or no salt if possible. How do I read food labels for sodium information? The Nutrition Facts label lists the amount of sodium in one serving of the food. If you eat more than one serving, you must multiply the listed amount of sodium by the number of servings. Food labels may also identify foods as:  Sodium free-Less than 5 mg in a serving.  Very low sodium-35 mg or less in a serving.  Low sodium-140 mg or less in a serving.  Light in sodium-50% less sodium in a serving. For example, if a food that usually has 300 mg of sodium is changed to become light in sodium, it will have 150 mg of sodium.  Reduced sodium-25% less sodium in a serving. For example, if a food that usually has 400 mg of sodium is changed to reduced sodium, it will have 300 mg of sodium. What foods can I eat? Grains  Low-sodium cereals, including oats, puffed wheat and rice, and shredded wheat cereals. Low-sodium crackers. Unsalted rice and pasta. Lower-sodium bread. Vegetables  Frozen or fresh vegetables. Low-sodium or reduced-sodium canned vegetables. Low-sodium or reduced-sodium tomato sauce and  paste. Low-sodium or reduced-sodium tomato and vegetable juices. Fruits  Fresh, frozen, and canned fruit. Fruit juice. Meat and Other Protein Products  Low-sodium canned tuna and salmon. Fresh or frozen meat, poultry, seafood, and fish. Lamb. Unsalted nuts. Dried beans, peas, and lentils without added salt. Unsalted canned beans. Homemade soups without salt. Eggs. Dairy  Milk. Soy milk. Ricotta cheese. Low-sodium or reduced-sodium cheeses. Yogurt. Condiments  Fresh and dried herbs and spices. Salt-free seasonings. Onion and garlic powders.  Low-sodium varieties of mustard and ketchup. Fresh or refrigerated horseradish. Lemon juice. Fats and Oils  Reduced-sodium salad dressings. Unsalted butter. Other  Unsalted popcorn and pretzels. The items listed above may not be a complete list of recommended foods or beverages. Contact your dietitian for more options.  What foods are not recommended? Grains  Instant hot cereals. Bread stuffing, pancake, and biscuit mixes. Croutons. Seasoned rice or pasta mixes. Noodle soup cups. Boxed or frozen macaroni and cheese. Self-rising flour. Regular salted crackers. Vegetables  Regular canned vegetables. Regular canned tomato sauce and paste. Regular tomato and vegetable juices. Frozen vegetables in sauces. Salted [place] fries. Olives. Angie Fava. Relishes. Sauerkraut. Salsa. Meat and Other Protein Products  Salted, canned, smoked, spiced, or pickled meats, seafood, or fish. Bacon, ham, sausage, hot dogs, corned beef, chipped beef, and packaged luncheon meats. Salt pork. Jerky. Pickled herring. Anchovies, regular canned tuna, and sardines. Salted nuts. Dairy  Processed cheese and cheese spreads. Cheese curds. Blue cheese and cottage cheese. Buttermilk. Condiments  Onion and garlic salt, seasoned salt, table salt, and sea salt. Canned and packaged gravies. Worcestershire sauce. Tartar sauce. Barbecue sauce. Teriyaki sauce. Soy sauce, including reduced sodium. Steak sauce. Fish sauce. Oyster sauce. Cocktail sauce. Horseradish that you find on the shelf. Regular ketchup and mustard. Meat flavorings and tenderizers. Bouillon cubes. Hot sauce. Tabasco sauce. Marinades. Taco seasonings. Relishes. Fats and Oils  Regular salad dressings. Salted butter. Margarine. Ghee. Bacon fat. Other  Potato and tortilla chips. Corn chips and puffs. Salted popcorn and pretzels. Canned or dried soups. Pizza. Frozen entrees and pot pies. The items listed above may not be a complete list of foods and beverages to avoid. Contact  your dietitian for more information.  This information is not intended to replace advice given to you by your health care provider. Make sure you discuss any questions you have with your health care provider. Document Released: 08/05/2001 Document Revised: 07/22/2015 Document Reviewed: 12/18/2012 Elsevier Interactive Patient Education  2017 [employer].

## 2016-03-09 NOTE — Assessment & Plan Note (Signed)
Clinically stable. She continues to avoid etoh. States it has been well over six months since any etoh. No evidence of 3rd spacing/edema. She has gained weight, likely due to increased oral intake and types of foods she is eating. Plan to decrease aldactone to 100mg  daily. She will call if recurrent edema. She did not want labs today as she will have them at [facility] in less than two weeks. I will follow up on labs as available.   She has been advised to update her EGD per [facility]. Encouraged her to follow their recommendations.   Regarding N/V, possibly related to GERD. GB looked ok on U/S. Another good reason to have EGD as outlined. Encouraged low fat diet along with 2g na diet. Handout provided. Encouraged cutting back on soft drinks and fruit juices.   Return to the office in 3 months or sooner if needed

## 2016-03-23 ENCOUNTER — Ambulatory Visit: Payer: Self-pay | Admitting: Gastroenterology

## 2016-04-19 ENCOUNTER — Inpatient Hospital Stay
Admission: RE | Admit: 2016-04-19 | Discharge: 2016-04-21 | DRG: 885 | Disposition: A | Discharge status: Home / Self Care | Payer: Medicaid Other | Source: Intra-hospital | Attending: Psychiatry | Admitting: Psychiatry

## 2016-04-19 ENCOUNTER — Emergency Department
Admission: EM | Admit: 2016-04-19 | Discharge: 2016-04-19 | Disposition: A | Discharge status: Other Acute Inpatient Hospital | Payer: Medicaid Other | Attending: Emergency Medicine | Admitting: Emergency Medicine

## 2016-04-19 DIAGNOSIS — N189 Chronic kidney disease, unspecified: Secondary | ICD-10-CM | POA: Diagnosis not present

## 2016-04-19 DIAGNOSIS — F329 Major depressive disorder, single episode, unspecified: Secondary | ICD-10-CM | POA: Diagnosis not present

## 2016-04-19 DIAGNOSIS — F332 Major depressive disorder, recurrent severe without psychotic features: Principal | ICD-10-CM | POA: Diagnosis present

## 2016-04-19 DIAGNOSIS — Z8249 Family history of ischemic heart disease and other diseases of the circulatory system: Secondary | ICD-10-CM | POA: Diagnosis not present

## 2016-04-19 DIAGNOSIS — Z046 Encounter for general psychiatric examination, requested by authority: Secondary | ICD-10-CM | POA: Diagnosis present

## 2016-04-19 DIAGNOSIS — K766 Portal hypertension: Secondary | ICD-10-CM | POA: Diagnosis present

## 2016-04-19 DIAGNOSIS — K7030 Alcoholic cirrhosis of liver without ascites: Secondary | ICD-10-CM | POA: Diagnosis present

## 2016-04-19 DIAGNOSIS — F1020 Alcohol dependence, uncomplicated: Secondary | ICD-10-CM | POA: Diagnosis present

## 2016-04-19 DIAGNOSIS — Z87891 Personal history of nicotine dependence: Secondary | ICD-10-CM

## 2016-04-19 DIAGNOSIS — Z818 Family history of other mental and behavioral disorders: Secondary | ICD-10-CM | POA: Diagnosis not present

## 2016-04-19 DIAGNOSIS — Z833 Family history of diabetes mellitus: Secondary | ICD-10-CM

## 2016-04-19 DIAGNOSIS — F32A Depression, unspecified: Secondary | ICD-10-CM

## 2016-04-19 DIAGNOSIS — F429 Obsessive-compulsive disorder, unspecified: Secondary | ICD-10-CM | POA: Diagnosis present

## 2016-04-19 DIAGNOSIS — F4310 Post-traumatic stress disorder, unspecified: Secondary | ICD-10-CM | POA: Diagnosis present

## 2016-04-19 DIAGNOSIS — Z79899 Other long term (current) drug therapy: Secondary | ICD-10-CM | POA: Diagnosis not present

## 2016-04-19 DIAGNOSIS — K219 Gastro-esophageal reflux disease without esophagitis: Secondary | ICD-10-CM | POA: Diagnosis present

## 2016-04-19 DIAGNOSIS — I129 Hypertensive chronic kidney disease with stage 1 through stage 4 chronic kidney disease, or unspecified chronic kidney disease: Secondary | ICD-10-CM | POA: Insufficient documentation

## 2016-04-19 DIAGNOSIS — I10 Essential (primary) hypertension: Secondary | ICD-10-CM | POA: Diagnosis present

## 2016-04-19 DIAGNOSIS — G4700 Insomnia, unspecified: Secondary | ICD-10-CM | POA: Diagnosis present

## 2016-04-19 DIAGNOSIS — Z801 Family history of malignant neoplasm of trachea, bronchus and lung: Secondary | ICD-10-CM

## 2016-04-19 DIAGNOSIS — Z811 Family history of alcohol abuse and dependence: Secondary | ICD-10-CM | POA: Diagnosis not present

## 2016-04-19 DIAGNOSIS — Y907 Blood alcohol level of 200-239 mg/100 ml: Secondary | ICD-10-CM | POA: Diagnosis present

## 2016-04-19 DIAGNOSIS — D529 Folate deficiency anemia, unspecified: Secondary | ICD-10-CM | POA: Diagnosis present

## 2016-04-19 DIAGNOSIS — G621 Alcoholic polyneuropathy: Secondary | ICD-10-CM | POA: Diagnosis present

## 2016-04-19 DIAGNOSIS — Z8049 Family history of malignant neoplasm of other genital organs: Secondary | ICD-10-CM

## 2016-04-19 DIAGNOSIS — E785 Hyperlipidemia, unspecified: Secondary | ICD-10-CM | POA: Diagnosis present

## 2016-04-19 DIAGNOSIS — Z86010 Personal history of colonic polyps: Secondary | ICD-10-CM

## 2016-04-19 LAB — CBC
HCT: 33.1 % — ABNORMAL LOW (ref 35.0–47.0)
Hemoglobin: 10.3 g/dL — ABNORMAL LOW (ref 12.0–16.0)
MCH: 24.5 pg — ABNORMAL LOW (ref 26.0–34.0)
MCHC: 31.3 g/dL — ABNORMAL LOW (ref 32.0–36.0)
MCV: 78.3 fL — ABNORMAL LOW (ref 80.0–100.0)
PLATELETS: 182 10*3/uL (ref 150–440)
RBC: 4.23 MIL/uL (ref 3.80–5.20)
RDW: 19.4 % — AB (ref 11.5–14.5)
WBC: 12 10*3/uL — AB (ref 3.6–11.0)

## 2016-04-19 LAB — SALICYLATE LEVEL

## 2016-04-19 LAB — COMPREHENSIVE METABOLIC PANEL
ALBUMIN: 3.4 g/dL — AB (ref 3.5–5.0)
ALT: 19 U/L (ref 14–54)
ANION GAP: 7 (ref 5–15)
AST: 35 U/L (ref 15–41)
Alkaline Phosphatase: 80 U/L (ref 38–126)
BILIRUBIN TOTAL: 0.4 mg/dL (ref 0.3–1.2)
BUN: 18 mg/dL (ref 6–20)
CO2: 25 mmol/L (ref 22–32)
Calcium: 8.6 mg/dL — ABNORMAL LOW (ref 8.9–10.3)
Chloride: 102 mmol/L (ref 101–111)
Creatinine, Ser: 1.42 mg/dL — ABNORMAL HIGH (ref 0.44–1.00)
GFR calc Af Amer: 51 mL/min — ABNORMAL LOW (ref >60)
GFR calc non Af Amer: 44 mL/min — ABNORMAL LOW (ref >60)
GLUCOSE: 93 mg/dL (ref 65–99)
Potassium: 3.9 mmol/L (ref 3.5–5.1)
SODIUM: 134 mmol/L — AB (ref 135–145)
TOTAL PROTEIN: 8.5 g/dL — AB (ref 6.5–8.1)

## 2016-04-19 LAB — AMMONIA: Ammonia: 46 umol/L — ABNORMAL HIGH (ref 9–35)

## 2016-04-19 LAB — ETHANOL: ALCOHOL ETHYL (B): 203 mg/dL — AB (ref <5)

## 2016-04-19 LAB — ACETAMINOPHEN LEVEL

## 2016-04-19 MED ORDER — GABAPENTIN 100 MG PO CAPS
100.0000 mg | ORAL_CAPSULE | Freq: Two times a day (BID) | ORAL | Status: DC
  Administered 2016-04-19 (×2): 100 mg via ORAL
  Filled 2016-04-19 (×2): qty 1

## 2016-04-19 MED ORDER — LORAZEPAM 2 MG/ML IJ SOLN
2.0000 mg | Freq: Once | INTRAMUSCULAR | Status: AC
  Administered 2016-04-19: 2 mg via INTRAMUSCULAR

## 2016-04-19 MED ORDER — DIPHENHYDRAMINE HCL 50 MG/ML IJ SOLN
50.0000 mg | Freq: Once | INTRAMUSCULAR | Status: AC
  Administered 2016-04-19: 50 mg via INTRAMUSCULAR

## 2016-04-19 MED ORDER — FOLIC ACID 1 MG PO TABS: 1.0000 mg | ORAL_TABLET | Freq: Every day | ORAL | Status: DC

## 2016-04-19 MED ORDER — PROPRANOLOL HCL 10 MG PO TABS
10.0000 mg | ORAL_TABLET | Freq: Three times a day (TID) | ORAL | Status: DC
  Administered 2016-04-20 – 2016-04-21 (×5): 10 mg via ORAL
  Filled 2016-04-19 (×7): qty 1

## 2016-04-19 MED ORDER — FUROSEMIDE 40 MG PO TABS
40.0000 mg | ORAL_TABLET | Freq: Every day | ORAL | Status: DC
  Administered 2016-04-19: 40 mg via ORAL
  Filled 2016-04-19: qty 1

## 2016-04-19 MED ORDER — LORAZEPAM 1 MG PO TABS: 1.0000 mg | ORAL_TABLET | Freq: Four times a day (QID) | ORAL | Status: DC | PRN

## 2016-04-19 MED ORDER — LORAZEPAM 2 MG/ML IJ SOLN: 1.0000 mg | Freq: Four times a day (QID) | INTRAMUSCULAR | Status: DC | PRN

## 2016-04-19 MED ORDER — DIPHENHYDRAMINE HCL 50 MG/ML IJ SOLN
INTRAMUSCULAR | Status: AC
  Administered 2016-04-19: 50 mg via INTRAMUSCULAR
  Filled 2016-04-19: qty 1

## 2016-04-19 MED ORDER — GABAPENTIN 100 MG PO CAPS
100.0000 mg | ORAL_CAPSULE | Freq: Two times a day (BID) | ORAL | Status: DC
  Administered 2016-04-20 – 2016-04-21 (×3): 100 mg via ORAL
  Filled 2016-04-19 (×3): qty 1

## 2016-04-19 MED ORDER — LACTULOSE 10 GM/15ML PO SOLN
10.0000 g | Freq: Two times a day (BID) | ORAL | Status: DC
  Administered 2016-04-19 (×2): 10 g via ORAL
  Filled 2016-04-19 (×2): qty 30

## 2016-04-19 MED ORDER — PROPRANOLOL HCL 20 MG PO TABS
10.0000 mg | ORAL_TABLET | Freq: Three times a day (TID) | ORAL | Status: DC
  Administered 2016-04-19 (×2): 10 mg via ORAL
  Filled 2016-04-19: qty 2
  Filled 2016-04-19: qty 1

## 2016-04-19 MED ORDER — ADULT MULTIVITAMIN W/MINERALS CH
1.0000 | ORAL_TABLET | Freq: Every day | ORAL | Status: DC
  Administered 2016-04-20 – 2016-04-21 (×2): 1 via ORAL
  Filled 2016-04-19 (×2): qty 1

## 2016-04-19 MED ORDER — VITAMIN B-1 100 MG PO TABS
100.0000 mg | ORAL_TABLET | Freq: Every day | ORAL | Status: DC
  Administered 2016-04-19: 100 mg via ORAL
  Filled 2016-04-19: qty 1

## 2016-04-19 MED ORDER — FLUOXETINE HCL 20 MG PO CAPS
20.0000 mg | ORAL_CAPSULE | Freq: Every day | ORAL | Status: DC
  Administered 2016-04-19: 20 mg via ORAL
  Filled 2016-04-19: qty 1

## 2016-04-19 MED ORDER — SPIRONOLACTONE 100 MG PO TABS
100.0000 mg | ORAL_TABLET | Freq: Every day | ORAL | Status: DC
Start: 2016-04-20 — End: 2016-04-21
  Administered 2016-04-20 – 2016-04-21 (×2): 100 mg via ORAL
  Filled 2016-04-19 (×2): qty 1

## 2016-04-19 MED ORDER — HALOPERIDOL LACTATE 5 MG/ML IJ SOLN
INTRAMUSCULAR | Status: AC
  Administered 2016-04-19: 5 mg via INTRAMUSCULAR
  Filled 2016-04-19: qty 1

## 2016-04-19 MED ORDER — LACTULOSE 10 GM/15ML PO SOLN
10.0000 g | Freq: Two times a day (BID) | ORAL | Status: DC
  Administered 2016-04-20 (×2): 10 g via ORAL
  Administered 2016-04-21: 09:00:00 via ORAL
  Filled 2016-04-19 (×3): qty 30

## 2016-04-19 MED ORDER — VITAMIN B-1 100 MG PO TABS
100.0000 mg | ORAL_TABLET | Freq: Every day | ORAL | Status: DC
  Administered 2016-04-20 – 2016-04-21 (×2): 100 mg via ORAL
  Filled 2016-04-19 (×2): qty 1

## 2016-04-19 MED ORDER — PANTOPRAZOLE SODIUM 40 MG PO TBEC
40.0000 mg | DELAYED_RELEASE_TABLET | Freq: Every day | ORAL | Status: DC
  Administered 2016-04-19: 40 mg via ORAL
  Filled 2016-04-19: qty 1

## 2016-04-19 MED ORDER — PANTOPRAZOLE SODIUM 40 MG PO TBEC
40.0000 mg | DELAYED_RELEASE_TABLET | Freq: Every day | ORAL | Status: DC
  Administered 2016-04-20 – 2016-04-21 (×2): 40 mg via ORAL
  Filled 2016-04-19 (×2): qty 1

## 2016-04-19 MED ORDER — SPIRONOLACTONE 100 MG PO TABS
100.0000 mg | ORAL_TABLET | Freq: Every day | ORAL | Status: DC
  Administered 2016-04-19: 100 mg via ORAL
  Filled 2016-04-19: qty 1

## 2016-04-19 MED ORDER — FUROSEMIDE 40 MG PO TABS
40.0000 mg | ORAL_TABLET | Freq: Every day | ORAL | Status: DC
  Administered 2016-04-20 – 2016-04-21 (×2): 40 mg via ORAL
  Filled 2016-04-19 (×2): qty 1

## 2016-04-19 MED ORDER — ACETAMINOPHEN 325 MG PO TABS
650.0000 mg | ORAL_TABLET | Freq: Four times a day (QID) | ORAL | Status: DC | PRN
  Administered 2016-04-21: 650 mg via ORAL
  Filled 2016-04-19: qty 2

## 2016-04-19 MED ORDER — FOLIC ACID 1 MG PO TABS
1.0000 mg | ORAL_TABLET | Freq: Every day | ORAL | Status: DC
  Administered 2016-04-19: 1 mg via ORAL
  Filled 2016-04-19: qty 1

## 2016-04-19 MED ORDER — THIAMINE HCL 100 MG/ML IJ SOLN
100.0000 mg | Freq: Every day | INTRAMUSCULAR | Status: DC
  Filled 2016-04-19: qty 2

## 2016-04-19 MED ORDER — THIAMINE HCL 100 MG/ML IJ SOLN: 100.0000 mg | Freq: Every day | INTRAMUSCULAR | Status: DC

## 2016-04-19 MED ORDER — LORAZEPAM 2 MG/ML IJ SOLN
INTRAMUSCULAR | Status: AC
  Administered 2016-04-19: 2 mg via INTRAMUSCULAR
  Filled 2016-04-19: qty 1

## 2016-04-19 MED ORDER — ALUM & MAG HYDROXIDE-SIMETH 200-200-20 MG/5ML PO SUSP: 30.0000 mL | ORAL | Status: DC | PRN

## 2016-04-19 MED ORDER — HALOPERIDOL LACTATE 5 MG/ML IJ SOLN
5.0000 mg | Freq: Once | INTRAMUSCULAR | Status: AC
  Administered 2016-04-19: 5 mg via INTRAMUSCULAR

## 2016-04-19 MED ORDER — ADULT MULTIVITAMIN W/MINERALS CH
1.0000 | ORAL_TABLET | Freq: Every day | ORAL | Status: DC
  Administered 2016-04-19: 1 via ORAL
  Filled 2016-04-19: qty 1

## 2016-04-19 MED ORDER — FOLIC ACID 1 MG PO TABS
1.0000 mg | ORAL_TABLET | Freq: Every day | ORAL | Status: DC
  Administered 2016-04-20 – 2016-04-21 (×2): 1 mg via ORAL
  Filled 2016-04-19 (×2): qty 1

## 2016-04-19 MED ORDER — MAGNESIUM HYDROXIDE 400 MG/5ML PO SUSP: 30.0000 mL | Freq: Every day | ORAL | Status: DC | PRN

## 2016-04-19 MED ORDER — FLUOXETINE HCL 20 MG PO CAPS
20.0000 mg | ORAL_CAPSULE | Freq: Every day | ORAL | Status: DC
  Administered 2016-04-20 – 2016-04-21 (×2): 20 mg via ORAL
  Filled 2016-04-19 (×2): qty 1

## 2016-04-19 NOTE — ED Notes (Signed)
Pt refused vitals, and told me to get out of her room.  RN and Dr. Owens Shark aware.

## 2016-04-19 NOTE — BH Assessment (Addendum)
Patient is to be admitted to [facility] by Dr. Weber Cooks.  Attending Physician will be Dr. Bary Leriche.   Patient has been assigned to room 306, by [facility] Charge Nurse [place].   ER staff is aware of the admission ( April, [place].; Ebony Hail,  Patient's Nurse & Linwood Dibbles, Patient Access).  Per Agricultural consultant, pt may be admitted at 2215.  EDP Dr. Reita Cliche is aware of pt's admission (per EDP note). Pt RN stated she will inform EDP of admission time and bed assignment.

## 2016-04-19 NOTE — ED Triage Notes (Signed)
Patient to ER with IVC papers. Patient unwilling to let this RN do vitals, blood work, dressing out, or obtain any information.

## 2016-04-19 NOTE — ED Notes (Signed)
Gave pt 2 cups of gingerale

## 2016-04-19 NOTE — ED Notes (Signed)
Dressed pt out in [employer].  1 pair black shoes, pants, underwear, bra, shirt, tank, girdle, 3 gold colored rings, a black necklace with gold colored charm, drivers license, set of keys given to this tech by the police officer who brought her in and pt identified them, were bagged, sealed, and locked up.

## 2016-04-19 NOTE — ED Notes (Signed)
Patient sleeping ,breakfast placed  In room, will warm when patient awakes

## 2016-04-19 NOTE — ED Provider Notes (Signed)
Dr. Weber Cooks saw patient and recommended admission to [facility] for depression.  Bed to be available after 7pm psych floor shift change.   Lisa Roca, MD 04/19/16 [phone]

## 2016-04-19 NOTE — ED Notes (Signed)
Patient is resting comfortably. 

## 2016-04-19 NOTE — Consult Note (Signed)
[facility] Consult   Reason for Consult:  Consult for 46 year old woman with history of alcohol abuse and depression who came into the hospital by law enforcement Referring Physician:  Jimmye Norman Patient Identification: Whitney Bush MRN:  [id number] Principal Diagnosis: Severe recurrent major depression without psychotic features [facility]) Diagnosis:   Patient Active Problem List   Diagnosis Date Noted  . Severe recurrent major depression without psychotic features ([place]) [F33.2] 04/19/2016  . GERD (gastroesophageal reflux disease) [K21.9] 03/09/2016  . Situational depression [F43.21] 02/07/2016  . Bilateral edema of lower extremity [R60.0]   . History of colonic polyps [Z86.010]   . Alcoholic cirrhosis of liver with ascites ([place]) [K70.31]   . Anasarca [R60.1] 02/16/2015  . Alcoholic hepatitis with ascites [K70.11]   . Ascites [R18.8]   . Bleeding gastrointestinal [K92.2]   . AKI (acute kidney injury) ([place]) [N17.9] 12/10/2014  . Volume overload [E87.70] 12/10/2014  . Cirrhosis ([place]) [K74.60]   . Elevated bilirubin [R17]   . Nausea with vomiting [R11.2]   . Diarrhea [R19.7]   . Absolute anemia [D64.9] 12/02/2014  . Jaundice [R17] 12/02/2014  . Essential hypertension [I10] 12/02/2014  . Alcohol abuse [F10.10] 12/02/2014  . Abdominal pain [R10.9] 12/02/2014    Total Time spent with patient: 1 hour  Subjective:   Whitney Bush is a 46 y.o. female patient admitted with "my mother signed me in".  HPI:  Patient interviewed. Chart reviewed. This is a 46 year old woman who was brought to the hospital by law enforcement. According to the paperwork filed by law enforcement, the patient herself called the police to her house to pick her up and bring her to the hospital and the family was surprised to find the police there. The patient denies that this is what has happened. She says that she thinks her mother signed her into the hospital. Patient is a poor historian very  tangential does not talk very much. She says that she has been feeling "awful" for years but is been worse in the last couple years. She indicates that she feels bad both emotionally and physically. Has no hope for the future. Nothing that she enjoys. Sleep is erratic and mostly during the daytime. Healthcare seems to be intermittent. She has significant liver kidney and heart problems and it's unclear how well she is taking care of herself. She admits that she had what she describes as "a couple shots" yesterday. Her alcohol level was quite elevated on admission. She said that she had been sober for many months before that. Denies that she's using any other drugs. Admits to having suicidal ideation but denies any active intent. She is unclear as to whether she is getting any current mental health treatment.  Social history: Says that she lives at home with her mother stepfather and daughter. Patient does not work outside the home.  Medical history: Heart disease liver disease kidney disease. Has cirrhosis.  Substance abuse history: Long-standing alcohol abuse. Patient claims that she only used a little alcohol yesterday and had not been drinking for months prior to that it's not clear how reliable this is. History of delirium tremens or seizures with withdrawal.  Past Psychiatric History: Patient does not remember if she's had psychiatric hospitalization in the past. According to the note she is on antidepressant medicine. Not clear if it's ever helped. Denies ever having tried to kill her self in the past.  Risk to Self:   Risk to Others:   Prior Inpatient Therapy:   Prior  Outpatient Therapy:    Past Medical History:  Past Medical History:  Diagnosis Date  . Alcohol abuse   . 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 Anemia   . Blood transfusion without reported diagnosis   . Boil 06/04/2015  . Chronic kidney disease   . Depression   . GERD (gastroesophageal reflux  disease)   . GI (gastrointestinal bleed) 07/28/2015  . Heart murmur   . Hyperlipidemia   . Hypertension   . Migraines   . Peripheral edema   . Tubular adenoma     Past Surgical History:  Procedure Laterality Date  . ABCESS DRAINAGE     x5; neck, arm, chest, back  . BIOPSY  02/24/2015   Procedure: BIOPSY;  Surgeon: Danie Binder, MD;  [facility];  Service: Endoscopy;;  gastric bx's  . CESAREAN SECTION    . COLONOSCOPY WITH PROPOFOL N/A 06/28/2015   Dr.Rourk- inadequate prep- One 6 mm polyp at the splenic flexure bx= tubular adenoma  . ESOPHAGOGASTRODUODENOSCOPY (EGD) WITH PROPOFOL N/A 02/24/2015   SLF: Grade II esophageal varices Moderate portal hypertensive gastropathy Mild erosive gastritis anemia likely due to many factors: gastritis, gastropathy, coagulopathy, chronic disease  . POLYPECTOMY  06/28/2015   Procedure: POLYPECTOMY;  Surgeon: Daneil Dolin, MD;  Location: [facility];  Service: Endoscopy;;  at splenic flexure   Family History:  Family History  Problem Relation Age of Onset  . Diabetes Father   . Hyperlipidemia Father   . Alcohol abuse Father   . Cancer Other   . Cervical cancer Maternal Grandmother   . Lung cancer Maternal Grandfather   . Alcohol abuse Other     multiple family members  . Diabetes Sister   . Hypertension Brother   . Colon cancer Neg Hx   . Liver disease Neg Hx    Family Psychiatric  History: Denies any family history Social History:  History  Alcohol Use No    Comment: Last drink April 2017     History  Drug Use No    Social History   Social History  . Marital status: Widowed    Spouse name: N/A  . Number of children: 1  . Years of education: 50   Occupational History  . unemployed    Social History Main Topics  . Smoking status: Former Smoker    Packs/day: 1.00    Years: 5.00    Types: Cigarettes    Quit date: 12/29/2010  . Smokeless tobacco: Former Systems developer    Types: Snuff  . Alcohol use No     Comment: Last  drink April 2017  . Drug use: No  . Sexual activity: Not Currently    Birth control/ protection: None   Other Topics Concern  . Not on file   Social History Narrative   Lives with parents   Additional Social History:    Allergies:  No Known Allergies  Labs:  Results for orders placed or performed during the hospital encounter of 04/19/16 (from the past 48 hour(s))  Acetaminophen level     Status: Abnormal   Collection Time: 04/19/16  2:55 AM  Result Value Ref Range   Acetaminophen (Tylenol), Serum <10 (L) 10 - 30 ug/mL    Comment:        THERAPEUTIC CONCENTRATIONS VARY SIGNIFICANTLY. A RANGE OF 10-30 ug/mL MAY BE AN EFFECTIVE CONCENTRATION FOR MANY PATIENTS. HOWEVER, SOME ARE BEST TREATED AT CONCENTRATIONS OUTSIDE  THIS RANGE. ACETAMINOPHEN CONCENTRATIONS >150 ug/mL AT 4 HOURS AFTER INGESTION AND >50 ug/mL AT 12 HOURS AFTER INGESTION ARE OFTEN ASSOCIATED WITH TOXIC REACTIONS.   Salicylate level     Status: None   Collection Time: 04/19/16  2:55 AM  Result Value Ref Range   Salicylate Lvl <7.8 2.8 - 30.0 mg/dL  CBC     Status: Abnormal   Collection Time: 04/19/16  2:55 AM  Result Value Ref Range   WBC 12.0 (H) 3.6 - 11.0 K/uL   RBC 4.23 3.80 - 5.20 MIL/uL   Hemoglobin 10.3 (L) 12.0 - 16.0 g/dL   HCT 33.1 (L) 35.0 - 47.0 %   MCV 78.3 (L) 80.0 - 100.0 fL   MCH 24.5 (L) 26.0 - 34.0 pg   MCHC 31.3 (L) 32.0 - 36.0 g/dL   RDW 19.4 (H) 11.5 - 14.5 %   Platelets 182 150 - 440 K/uL  Ethanol     Status: Abnormal   Collection Time: 04/19/16  2:55 AM  Result Value Ref Range   Alcohol, Ethyl (B) 203 (H) <5 mg/dL    Comment:        LOWEST DETECTABLE LIMIT FOR SERUM ALCOHOL IS 5 mg/dL FOR MEDICAL PURPOSES ONLY   Comprehensive metabolic panel     Status: Abnormal   Collection Time: 04/19/16  2:55 AM  Result Value Ref Range   Sodium 134 (L) 135 - 145 mmol/L   Potassium 3.9 3.5 - 5.1 mmol/L   Chloride 102 101 - 111 mmol/L   CO2 25 22 - 32 mmol/L   Glucose, Bld 93 65 -  99 mg/dL   BUN 18 6 - 20 mg/dL   Creatinine, Ser 1.42 (H) 0.44 - 1.00 mg/dL   Calcium 8.6 (L) 8.9 - 10.3 mg/dL   Total Protein 8.5 (H) 6.5 - 8.1 g/dL   Albumin 3.4 (L) 3.5 - 5.0 g/dL   AST 35 15 - 41 U/L   ALT 19 14 - 54 U/L   Alkaline Phosphatase 80 38 - 126 U/L   Total Bilirubin 0.4 0.3 - 1.2 mg/dL   GFR calc non Af Amer 44 (L) >60 mL/min   GFR calc Af Amer 51 (L) >60 mL/min    Comment: (NOTE) The eGFR has been calculated using the CKD EPI equation. This calculation has not been validated in all clinical situations. eGFR's persistently <60 mL/min signify possible Chronic Kidney Disease.    Anion gap 7 5 - 15    Current Facility-Administered Medications  Medication Dose Route Frequency Provider Last Rate Last Dose  . FLUoxetine (PROZAC) capsule 20 mg  20 mg Oral Daily Gonzella Lex, MD      . folic acid (FOLVITE) tablet 1 mg  1 mg Oral Daily Gonzella Lex, MD      . folic acid (FOLVITE) tablet 1 mg  1 mg Oral Daily Kalena Mander T Francheska Villeda, MD      . furosemide (LASIX) tablet 40 mg  40 mg Oral Daily Abelina Ketron T Heran Campau, MD      . gabapentin (NEURONTIN) capsule 100 mg  100 mg Oral BID Gonzella Lex, MD      . lactulose (CHRONULAC) 10 GM/15ML solution 10 g  10 g Oral BID Gonzella Lex, MD      . LORazepam (ATIVAN) tablet 1 mg  1 mg Oral Q6H PRN Gonzella Lex, MD       Or  . LORazepam (ATIVAN) injection 1 mg  1 mg Intravenous Q6H PRN Jenny Reichmann  T Hasnain Manheim, MD      . multivitamin with minerals tablet 1 tablet  1 tablet Oral Daily Amberlin Utke T Analis Distler, MD      . pantoprazole (PROTONIX) EC tablet 40 mg  40 mg Oral Daily Gonzella Lex, MD      . propranolol (INDERAL) tablet 10 mg  10 mg Oral TID Gonzella Lex, MD      . spironolactone (ALDACTONE) tablet 100 mg  100 mg Oral Daily Gonzella Lex, MD      . thiamine (VITAMIN B-1) tablet 100 mg  100 mg Oral Daily Gonzella Lex, MD       Or  . thiamine (B-1) injection 100 mg  100 mg Intravenous Daily Gonzella Lex, MD       Current Outpatient Prescriptions   Medication Sig Dispense Refill  . citalopram (CELEXA) 20 MG tablet Take 20 mg by mouth daily.    Marland Kitchen FLUoxetine (PROZAC) 20 MG tablet Take 1 tablet (20 mg total) by mouth daily. 30 tablet 3  . folic acid (FOLVITE) 1 MG tablet Take 1 tablet (1 mg total) by mouth daily. 90 tablet 1  . furosemide (LASIX) 40 MG tablet Take 1 tablet (40 mg total) by mouth daily. 30 tablet 11  . gabapentin (NEURONTIN) 100 MG capsule TAKE ONE CAPSULE BY MOUTH TWICE DAILY 180 capsule 1  . pantoprazole (PROTONIX) 40 MG tablet Take 1 tablet (40 mg total) by mouth daily. 30 tablet 2  . propranolol (INDERAL) 10 MG tablet Take 1 tablet (10 mg total) by mouth 3 (three) times daily. 90 tablet 11  . spironolactone (ALDACTONE) 100 MG tablet Take 1 tablet (100 mg total) by mouth 2 (two) times daily. 60 tablet 2  . B Complex Vitamins (VITAMIN B-COMPLEX PO) Take 1 tablet by mouth daily.    Marland Kitchen lactulose (CHRONULAC) 10 GM/15ML solution Take 15 mLs (10 g total) by mouth 2 (two) times daily. (Patient not taking: Reported on 04/19/2016) 946 mL 3  . Multiple Vitamin (MULTIVITAMIN WITH MINERALS) TABS tablet Take 1 tablet by mouth daily.    . vitamin E 400 UNIT capsule Take 400 Units by mouth daily.    . Vitamins/Minerals TABS Take by mouth.      Musculoskeletal: Strength & Muscle Tone: decreased Gait & Station: ataxic Patient leans: N/A  Psychiatric Specialty Exam: Physical Exam  Nursing note and vitals reviewed. Constitutional: She appears well-developed and well-nourished.  HENT:  Head: Normocephalic and atraumatic.  Eyes: Conjunctivae are normal. Pupils are equal, round, and reactive to light.  Neck: Normal range of motion.  Cardiovascular: Regular rhythm and normal heart sounds.   Respiratory: Effort normal. No respiratory distress.  GI: Soft.  Musculoskeletal: Normal range of motion.  Neurological: She is alert.  Skin: Skin is warm and dry.  Psychiatric: Her mood appears anxious. Her affect is blunt. Her speech is  delayed. She is slowed and withdrawn. She expresses impulsivity. She exhibits a depressed mood. She expresses suicidal ideation. She expresses no suicidal plans. She exhibits abnormal recent memory and abnormal remote memory.    Review of Systems  Constitutional: Positive for malaise/fatigue.  HENT: Negative.   Eyes: Negative.   Respiratory: Negative.   Cardiovascular: Negative.   Gastrointestinal: Negative.   Musculoskeletal: Positive for back pain.  Skin: Negative.   Neurological: Negative.   Psychiatric/Behavioral: Positive for depression, memory loss, substance abuse and suicidal ideas. Negative for hallucinations. The patient is nervous/anxious and has insomnia.     Blood pressure 120/67, pulse  78, temperature 97.6 F (36.4 C), temperature source Oral, resp. rate 16, SpO2 97 %.There is no height or weight on file to calculate BMI.  General Appearance: Disheveled  Eye Contact:  Minimal  Speech:  Garbled, Slow and Slurred  Volume:  Decreased  Mood:  Anxious and Depressed  Affect:  Constricted  Thought Process:  Disorganized  Orientation:  Full (Time, Place, and Person)  Thought Content:  Illogical  Suicidal Thoughts:  Yes.  without intent/plan  Homicidal Thoughts:  No  Memory:  Immediate;   Fair Recent;   Poor Remote;   Fair  Judgement:  Impaired  Insight:  Shallow  Psychomotor Activity:  Decreased  Concentration:  Concentration: Poor  Recall:  Poor  Fund of Knowledge:  Poor  Language:  Poor  Akathisia:  No  Handed:  Right  AIMS (if indicated):     Assets:  Housing  ADL's:  Impaired  Cognition:  Impaired,  Mild  Sleep:        Treatment Plan Summary: Plan Patient will be admitted to the psychiatric ward. Up old IVC. Orders for her usual outpatient medicine. Check ammonia level. Check regular labs. Engage in appropriate treatment on the unit. Alcohol withdrawal orders in place.  Disposition: Recommend psychiatric Inpatient admission when medically  cleared. Supportive therapy provided about ongoing stressors.  Alethia Berthold, MD 04/19/2016 3:36 PM

## 2016-04-19 NOTE — ED Notes (Signed)
Spoke with pt's daughter on the phone regarding reason for patient being here.  Per daughter, pt uses ETOH and is non-compliant with her medication.  Daughter states pt has history of manic depressive.  Per daughter, patient called the cops on herself and the family was awoken when the sheriff's deputy arrived at the house.  Daughter denies knowing if patient uses any illegal drugs.  Daughter notified of times to call tomorrow to speak with patient and phone number to call.  No other information given by daughter at this time.

## 2016-04-19 NOTE — ED Provider Notes (Signed)
[facility] Emergency Department Provider Note    First MD Initiated Contact with Patient 04/19/16 0236     (approximate)  I have reviewed the triage vital signs and the nursing notes.   HISTORY  Chief Complaint Psychiatric Evaluation    HPI Whitney Bush is a 46 y.o. female with below of chronic medical conditions presents to the emergency department involuntary committed secondary to voicing suicidal ideation. In addition patient appears to be intoxicated at this time. Patient aggressive and verbally abusive towards staff.   Past Medical History:  Diagnosis Date  . Alcohol abuse   . 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 Anemia   . Blood transfusion without reported diagnosis   . Boil 06/04/2015  . Chronic kidney disease   . Depression   . GERD (gastroesophageal reflux disease)   . GI (gastrointestinal bleed) 07/28/2015  . Heart murmur   . Hyperlipidemia   . Hypertension   . Migraines   . Peripheral edema   . Tubular adenoma     Patient Active Problem List   Diagnosis Date Noted  . GERD (gastroesophageal reflux disease) 03/09/2016  . Situational depression 02/07/2016  . Bilateral edema of lower extremity   . History of colonic polyps   . Alcoholic cirrhosis of liver with ascites ([place])   . Anasarca 02/16/2015  . Alcoholic hepatitis with ascites   . Ascites   . Bleeding gastrointestinal   . AKI (acute kidney injury) ([place]) 12/10/2014  . Volume overload 12/10/2014  . Cirrhosis ([place])   . Elevated bilirubin   . Nausea with vomiting   . Diarrhea   . Absolute anemia 12/02/2014  . Jaundice 12/02/2014  . Essential hypertension 12/02/2014  . Alcohol abuse 12/02/2014  . Abdominal pain 12/02/2014    Past Surgical History:  Procedure Laterality Date  . ABCESS DRAINAGE     x5; neck, arm, chest, back  . BIOPSY  02/24/2015   Procedure: BIOPSY;  Surgeon: Danie Binder, MD;  [facility];  Service:  Endoscopy;;  gastric bx's  . CESAREAN SECTION    . COLONOSCOPY WITH PROPOFOL N/A 06/28/2015   Dr.Rourk- inadequate prep- One 6 mm polyp at the splenic flexure bx= tubular adenoma  . ESOPHAGOGASTRODUODENOSCOPY (EGD) WITH PROPOFOL N/A 02/24/2015   SLF: Grade II esophageal varices Moderate portal hypertensive gastropathy Mild erosive gastritis anemia likely due to many factors: gastritis, gastropathy, coagulopathy, chronic disease  . POLYPECTOMY  06/28/2015   Procedure: POLYPECTOMY;  Surgeon: Daneil Dolin, MD;  [facility];  Service: Endoscopy;;  at splenic flexure    Prior to Admission medications   Medication Sig Start Date End Date Taking? Authorizing Provider  B Complex Vitamins (VITAMIN B-COMPLEX PO) Take 1 tablet by mouth daily.    Historical Provider, MD  FLUoxetine (PROZAC) 20 MG tablet Take 1 tablet (20 mg total) by mouth daily. 02/07/16   Raylene Everts, MD  folic acid (FOLVITE) 1 MG tablet Take 1 tablet (1 mg total) by mouth daily. 02/07/16   Raylene Everts, MD  furosemide (LASIX) 40 MG tablet Take 1 tablet (40 mg total) by mouth daily. 11/23/15   Daneil Dolin, MD  gabapentin (NEURONTIN) 100 MG capsule TAKE ONE CAPSULE BY MOUTH TWICE DAILY 03/03/16   Fayrene Helper, MD  lactulose (CHRONULAC) 10 GM/15ML solution Take 15 mLs (10 g total) by mouth 2 (two) times daily. 12/02/15   Jennette Banker  Meda Coffee, MD  Multiple Vitamin (MULTIVITAMIN WITH MINERALS) TABS tablet Take 1 tablet by mouth daily. 12/05/14   Erline Hau, MD  Norgestimate-Ethinyl Estradiol Triphasic (TRI-LO-ESTARYLLA) 0.18/0.215/0.25 MG-25 MCG tab Take 1 tablet by mouth daily.    Historical Provider, MD  pantoprazole (PROTONIX) 40 MG tablet Take 1 tablet (40 mg total) by mouth daily. 12/02/15   Raylene Everts, MD  propranolol (INDERAL) 10 MG tablet Take 1 tablet (10 mg total) by mouth 3 (three) times daily. 01/10/16   Daneil Dolin, MD  spironolactone (ALDACTONE) 100 MG tablet Take 1 tablet (100 mg  total) by mouth 2 (two) times daily. 02/07/16   Raylene Everts, MD  vitamin E 400 UNIT capsule Take 400 Units by mouth daily.    Historical Provider, MD  Vitamins/Minerals TABS Take by mouth. 12/05/14   Historical Provider, MD    Allergies Patient has no known allergies.  Family History  Problem Relation Age of Onset  . Diabetes Father   . Hyperlipidemia Father   . Alcohol abuse Father   . Cancer Other   . Cervical cancer Maternal Grandmother   . Lung cancer Maternal Grandfather   . Alcohol abuse Other     multiple family members  . Diabetes Sister   . Hypertension Brother   . Colon cancer Neg Hx   . Liver disease Neg Hx     Social History Social History  Substance Use Topics  . Smoking status: Former Smoker    Packs/day: 1.00    Years: 5.00    Types: Cigarettes    Quit date: 12/29/2010  . Smokeless tobacco: Former Systems developer    Types: Snuff  . Alcohol use No     Comment: Last drink April 2017    Review of Systems Constitutional: No fever/chills Eyes: No visual changes. ENT: No sore throat. Cardiovascular: Denies chest pain. Respiratory: Denies shortness of breath. Gastrointestinal: No abdominal pain.  No nausea, no vomiting.  No diarrhea.  No constipation. Genitourinary: Negative for dysuria. Musculoskeletal: Negative for back pain. Skin: Negative for rash. Neurological: Negative for headaches, focal weakness or numbness.  10-point ROS otherwise negative.  ____________________________________________   PHYSICAL EXAM:  VITAL SIGNS: ED Triage Vitals [04/19/16 0448]  Enc Vitals Group     BP 96/60     Pulse Rate 85     Resp 16     Temp 97.6 F (36.4 C)     Temp Source Oral     SpO2 97 %     Weight      Height      Head Circumference      Peak Flow      Pain Score      Pain Loc      Pain Edu?      Excl. in [place]?     Constitutional: Alert, Combative and verbally abusive to staff. Appears intoxicated Eyes: Conjunctivae are normal. PERRL. EOMI. Head:  Atraumatic. Mouth/Throat: Mucous membranes are moist.  Oropharynx non-erythematous. Neck: No stridor.  Cardiovascular: Normal rate, regular rhythm. Good peripheral circulation. Grossly normal heart sounds. Respiratory: Normal respiratory effort.  No retractions. Lungs CTAB. Gastrointestinal: Soft and nontender. No distention.  Musculoskeletal: No lower extremity tenderness nor edema. No gross deformities of extremities. Neurologic:  Normal speech and language. No gross focal neurologic deficits are appreciated.  Skin:  Skin is warm, dry and intact. No rash noted. Psychiatric: Mood and affect are normal. Speech and behavior are normal.  ____________________________________________   LABS (all labs ordered  are listed, but only abnormal results are displayed)  Labs Reviewed  ACETAMINOPHEN LEVEL - Abnormal; Notable for the following:       Result Value   Acetaminophen (Tylenol), Serum <10 (*)    All other components within normal limits  CBC - Abnormal; Notable for the following:    WBC 12.0 (*)    Hemoglobin 10.3 (*)    HCT 33.1 (*)    MCV 78.3 (*)    MCH 24.5 (*)    MCHC 31.3 (*)    RDW 19.4 (*)    All other components within normal limits  ETHANOL - Abnormal; Notable for the following:    Alcohol, Ethyl (B) 203 (*)    All other components within normal limits  COMPREHENSIVE METABOLIC PANEL - Abnormal; Notable for the following:    Sodium 134 (*)    Creatinine, Ser 1.42 (*)    Calcium 8.6 (*)    Total Protein 8.5 (*)    Albumin 3.4 (*)    GFR calc non Af Amer 44 (*)    GFR calc Af Amer 51 (*)    All other components within normal limits  SALICYLATE LEVEL  URINE DRUG SCREEN, QUALITATIVE ([facility] ONLY)     Procedures      INITIAL IMPRESSION / ASSESSMENT AND PLAN / ED COURSE  Pertinent labs & imaging results that were available during my care of the patient were reviewed by me and considered in my medical decision making (see chart for details).  Attempted multiple  attempts to verbally de-escalate the situation with the patient who is verbally abusive and threatening to the staff. However patient remained combative and verbally abusive to staff and a such she required chemical sedation.    ____________________________________________  FINAL CLINICAL IMPRESSION(S) / ED DIAGNOSES  Final diagnoses:  Depression, unspecified depression type  Alcohol intoxication   MEDICATIONS GIVEN DURING THIS VISIT:  Medications  haloperidol lactate (HALDOL) injection 5 mg (5 mg Intramuscular Given by Other 04/19/16 0245)  diphenhydrAMINE (BENADRYL) injection 50 mg (50 mg Intramuscular Given by Other 04/19/16 0245)  LORazepam (ATIVAN) injection 2 mg (2 mg Intramuscular Given by Other 04/19/16 0245)     NEW OUTPATIENT MEDICATIONS STARTED DURING THIS VISIT:  New Prescriptions   No medications on file    Modified Medications   No medications on file    Discontinued Medications   No medications on file     Note:  This document was prepared using Dragon voice recognition software and may include unintentional dictation errors.    Gregor Hams, MD 04/20/16 [phone]

## 2016-04-19 NOTE — ED Notes (Signed)
Redraw red top per labs order.  Done and sent

## 2016-04-19 NOTE — BH Assessment (Signed)
Assessment Note  Whitney Bush is an 46 y.o. female. Who presents with IVC papers.The pts daughter has reported that the daughter pt uses ETOH and is non-compliant with her medication.  Daughter states pt has history of manic depressive.  Per daughter, patient called the cops on herself and the family was awoken when the sheriff's deputy arrived at the house.  No current or previous outpatient provider reported. No previous inpatient hospitalization reported. Pt presents as hopeless and reports a history of alcohol dependence. Pt denies any previous suicide attempt although she admits to experiencing SI with out a plan.  Writer is concerned for Pts safety. Pt  cannot contract for safety outside of the hospital. Pt requires inpatient psychiatric hospitalization for safety common stabilization, and medication management.    Diagnosis: Major Depressive Disorder   Past Medical History:  Past Medical History:  Diagnosis Date  . Alcohol abuse   . 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 Anemia   . Blood transfusion without reported diagnosis   . Boil 06/04/2015  . Chronic kidney disease   . Depression   . GERD (gastroesophageal reflux disease)   . GI (gastrointestinal bleed) 07/28/2015  . Heart murmur   . Hyperlipidemia   . Hypertension   . Migraines   . Peripheral edema   . Tubular adenoma     Past Surgical History:  Procedure Laterality Date  . ABCESS DRAINAGE     x5; neck, arm, chest, back  . BIOPSY  02/24/2015   Procedure: BIOPSY;  Surgeon: Danie Binder, MD;  [facility];  Service: Endoscopy;;  gastric bx's  . CESAREAN SECTION    . COLONOSCOPY WITH PROPOFOL N/A 06/28/2015   Dr.Rourk- inadequate prep- One 6 mm polyp at the splenic flexure bx= tubular adenoma  . ESOPHAGOGASTRODUODENOSCOPY (EGD) WITH PROPOFOL N/A 02/24/2015   SLF: Grade II esophageal varices Moderate portal hypertensive gastropathy Mild erosive gastritis anemia likely due to  many factors: gastritis, gastropathy, coagulopathy, chronic disease  . POLYPECTOMY  06/28/2015   Procedure: POLYPECTOMY;  Surgeon: Daneil Dolin, MD;  Location: [facility];  Service: Endoscopy;;  at splenic flexure    Family History:  Family History  Problem Relation Age of Onset  . Diabetes Father   . Hyperlipidemia Father   . Alcohol abuse Father   . Cancer Other   . Cervical cancer Maternal Grandmother   . Lung cancer Maternal Grandfather   . Alcohol abuse Other     multiple family members  . Diabetes Sister   . Hypertension Brother   . Colon cancer Neg Hx   . Liver disease Neg Hx     Social History:  reports that she quit smoking about 5 years ago. Her smoking use included Cigarettes. She has a 5.00 pack-year smoking history. She has quit using smokeless tobacco. Her smokeless tobacco use included Snuff. She reports that she does not drink alcohol or use drugs.  Additional Social History:  Alcohol / Drug Use Pain Medications: SEE MAR Prescriptions: SEE MAR Over the Counter: SEE MAR History of alcohol / drug use?: Yes Longest period of sobriety (when/how long): Unknown  Negative Consequences of Use: Personal relationships Substance #1 Name of Substance 1: Alcohol  1 - Age of First Use: unknown 1 - Amount (size/oz): "a little" 1 - Frequency: 1 time drinking in months  1 - Duration: n/a 1 - Last Use / Amount: 04/18/16  CIWA: CIWA-Ar BP:  120/67 Pulse Rate: 78 COWS:    Allergies: No Known Allergies  Home Medications:  (Not in a hospital admission)  OB/GYN Status:  No LMP recorded.  General Assessment Data TTS Assessment: In system Is this a Tele or Face-to-Face Assessment?: Face-to-Face Is this an Initial Assessment or a Re-assessment for this encounter?: Initial Assessment Is patient pregnant?: No Pregnancy Status: No Living Arrangements: Other relatives, Parent Can pt return to current living arrangement?: Yes Admission Status: Involuntary Is patient  capable of signing voluntary admission?: No Referral Source: Self/Family/Friend Insurance type: Medicaid   Medical Screening Exam ([place]) Medical Exam completed: Yes  Crisis Care Plan Living Arrangements: Other relatives, Parent Legal Guardian: Other: (none) Name of Psychiatrist: None Name of Therapist: None   Education Status Is patient currently in school?: No Current Grade: n/a Highest grade of school patient has completed: n/a Name of school: n/a Contact person: n/a  Risk to self with the past 6 months Suicidal Ideation: Yes-Currently Present Has patient been a risk to self within the past 6 months prior to admission? : Yes Suicidal Intent: No Has patient had any suicidal intent within the past 6 months prior to admission? : No Is patient at risk for suicide?: Yes Suicidal Plan?: No Has patient had any suicidal plan within the past 6 months prior to admission? : No Access to Means:  (n/a) What has been your use of drugs/alcohol within the last 12 months?: Alcohol  Previous Attempts/Gestures: No How many times?: 0 Other Self Harm Risks: 0 Triggers for Past Attempts:  (n/a) Intentional Self Injurious Behavior: None Family Suicide History: Unknown Recent stressful life event(s): Trauma (Comment) Persecutory voices/beliefs?: No Depression: Yes Depression Symptoms: Insomnia, Despondent, Feeling worthless/self pity, Loss of interest in usual pleasures, Fatigue, Isolating Substance abuse history and/or treatment for substance abuse?: Yes Suicide prevention information given to non-admitted patients: Not applicable  Risk to Others within the past 6 months Homicidal Ideation: No Does patient have any lifetime risk of violence toward others beyond the six months prior to admission? : No Thoughts of Harm to Others: No Current Homicidal Intent: No Current Homicidal Plan: No Access to Homicidal Means: No Identified Victim: n/a History of harm to others?:  No Assessment of Violence: None Noted Violent Behavior Description: n/a Does patient have access to weapons?: No Criminal Charges Pending?: No Does patient have a court date: No Is patient on probation?: No  Psychosis Hallucinations: None noted Delusions: None noted  Mental Status Report Appearance/Hygiene: In scrubs Eye Contact: Poor Motor Activity: Freedom of movement Speech: Slow, Slurred Level of Consciousness: Quiet/awake Mood: Depressed Affect: Depressed Anxiety Level: Moderate Thought Processes: Coherent Judgement: Impaired Orientation: Place, Time, Situation, Person Obsessive Compulsive Thoughts/Behaviors: Minimal  Cognitive Functioning Concentration: Poor Memory: Recent Impaired, Remote Impaired IQ: Average Insight: Poor Impulse Control: Poor Appetite: Fair Weight Loss: 0 Weight Gain: 0 Sleep: Unable to Assess Vegetative Symptoms: Decreased grooming  ADLScreening [facility] Assessment Services) Patient's cognitive ability adequate to safely complete daily activities?: Yes Patient able to express need for assistance with ADLs?: Yes Independently performs ADLs?: Yes (appropriate for developmental age)  Prior Inpatient Therapy Prior Inpatient Therapy: No Prior Therapy Dates: n/a Prior Therapy Facilty/Provider(s): n/a Reason for Treatment: n/a  Prior Outpatient Therapy Prior Outpatient Therapy: No Prior Therapy Dates: n/a Prior Therapy Facilty/Provider(s): n/a Reason for Treatment: n/a Does patient have an ACCT team?: No Does patient have Intensive In-House Services?  : No Does patient have [facility] services? : No Does patient have P4CC services?: No  ADL Screening (condition at time of admission) Patient's cognitive ability adequate to safely complete daily activities?: Yes Patient able to express need for assistance with ADLs?: Yes Independently performs ADLs?: Yes (appropriate for developmental age)       Abuse/Neglect Assessment (Assessment to be  complete while patient is alone) Physical Abuse: Denies Verbal Abuse: Denies Sexual Abuse: Denies Exploitation of patient/patient's resources: Denies Self-Neglect: Denies Values / Beliefs Cultural Requests During Hospitalization: None Spiritual Requests During Hospitalization: None Consults Spiritual Care Consult Needed: No Social Work Consult Needed: No      Additional Information 1:1 In Past 12 Months?: No CIRT Risk: No Elopement Risk: No Does patient have medical clearance?: Yes     Disposition:  Disposition Initial Assessment Completed for this Encounter: Yes Disposition of Patient: Inpatient treatment program  On Site Evaluation by:   Reviewed with Physician:    Laretta Alstrom 04/19/2016 6:32 PM

## 2016-04-19 NOTE — ED Notes (Signed)
Per Dr. Owens Shark, no vitals required at this time due to patient agitation.

## 2016-04-20 DIAGNOSIS — F332 Major depressive disorder, recurrent severe without psychotic features: Principal | ICD-10-CM

## 2016-04-20 DIAGNOSIS — F429 Obsessive-compulsive disorder, unspecified: Secondary | ICD-10-CM | POA: Diagnosis present

## 2016-04-20 DIAGNOSIS — F4310 Post-traumatic stress disorder, unspecified: Secondary | ICD-10-CM | POA: Diagnosis present

## 2016-04-20 LAB — TSH: TSH: 3.392 u[IU]/mL (ref 0.350–4.500)

## 2016-04-20 LAB — LIPID PANEL
CHOL/HDL RATIO: 3.8 ratio
Cholesterol: 150 mg/dL (ref 0–200)
HDL: 40 mg/dL — ABNORMAL LOW (ref >40)
LDL Cholesterol: 92 mg/dL (ref 0–99)
Triglycerides: 91 mg/dL (ref <150)
VLDL: 18 mg/dL (ref 0–40)

## 2016-04-20 MED ORDER — LACTULOSE 10 GM/15ML PO SOLN: 10.0000 g | Freq: Two times a day (BID) | ORAL | 3 refills | Status: DC

## 2016-04-20 MED ORDER — FUROSEMIDE 40 MG PO TABS: 40.0000 mg | ORAL_TABLET | Freq: Every day | ORAL | 11 refills | Status: DC

## 2016-04-20 MED ORDER — PRAZOSIN HCL 1 MG PO CAPS
1.0000 mg | ORAL_CAPSULE | Freq: Two times a day (BID) | ORAL | Status: DC
  Administered 2016-04-20 – 2016-04-21 (×2): 1 mg via ORAL
  Filled 2016-04-20 (×2): qty 1

## 2016-04-20 MED ORDER — PROPRANOLOL HCL 10 MG PO TABS: 10.0000 mg | ORAL_TABLET | Freq: Three times a day (TID) | ORAL | 1 refills | Status: DC

## 2016-04-20 MED ORDER — PANTOPRAZOLE SODIUM 40 MG PO TBEC: 40.0000 mg | DELAYED_RELEASE_TABLET | Freq: Every day | ORAL | 2 refills | Status: DC

## 2016-04-20 MED ORDER — GABAPENTIN 100 MG PO CAPS: 100.0000 mg | ORAL_CAPSULE | Freq: Two times a day (BID) | ORAL | 1 refills | Status: DC

## 2016-04-20 MED ORDER — TRAZODONE HCL 100 MG PO TABS
100.0000 mg | ORAL_TABLET | Freq: Every day | ORAL | Status: DC
  Administered 2016-04-20: 100 mg via ORAL
  Filled 2016-04-20: qty 1

## 2016-04-20 MED ORDER — FLUOXETINE HCL 20 MG PO TABS: 20.0000 mg | ORAL_TABLET | Freq: Every day | ORAL | 3 refills | Status: DC

## 2016-04-20 MED ORDER — SPIRONOLACTONE 100 MG PO TABS: 100.0000 mg | ORAL_TABLET | Freq: Two times a day (BID) | ORAL | 1 refills | Status: DC

## 2016-04-20 MED ORDER — PRAZOSIN HCL 1 MG PO CAPS
1.0000 mg | ORAL_CAPSULE | Freq: Two times a day (BID) | ORAL | 1 refills | Status: DC
Start: 2016-04-21 — End: 2017-05-15

## 2016-04-20 NOTE — Plan of Care (Signed)
Problem: Coping: Goal: Ability to verbalize frustrations and anger appropriately will improve Outcome: Progressing Working  On coping skill, received  Handout

## 2016-04-20 NOTE — Tx Team (Addendum)
Interdisciplinary Treatment and Diagnostic Plan Update  04/20/2016 Time of Session: [place] MRN: [id number]  Principal Diagnosis: Severe recurrent major depression without psychotic features ([place])  Secondary Diagnoses: Principal Problem:   Severe recurrent major depression without psychotic features ([place]) Active Problems:   Essential hypertension   Alcohol use disorder, moderate, dependence (HCC)   Alcoholic cirrhosis (HCC)   GERD (gastroesophageal reflux disease)   PTSD (post-traumatic stress disorder)   OCD (obsessive compulsive disorder)   Current Medications:  Current Facility-Administered Medications  Medication Dose Route Frequency Provider Last Rate Last Dose  . acetaminophen (TYLENOL) tablet 650 mg  650 mg Oral Q6H PRN Gonzella Lex, MD      . alum & mag hydroxide-simeth (MAALOX/MYLANTA) 200-200-20 MG/5ML suspension 30 mL  30 mL Oral Q4H PRN Gonzella Lex, MD      . FLUoxetine (PROZAC) capsule 20 mg  20 mg Oral Daily Gonzella Lex, MD   20 mg at 04/20/16 0825  . folic acid (FOLVITE) tablet 1 mg  1 mg Oral Daily Gonzella Lex, MD   1 mg at 04/20/16 0825  . furosemide (LASIX) tablet 40 mg  40 mg Oral Daily Gonzella Lex, MD   40 mg at 04/20/16 0825  . gabapentin (NEURONTIN) capsule 100 mg  100 mg Oral BID Gonzella Lex, MD   100 mg at 04/20/16 0825  . lactulose (CHRONULAC) 10 GM/15ML solution 10 g  10 g Oral BID Gonzella Lex, MD   10 g at 04/20/16 0818  . LORazepam (ATIVAN) tablet 1 mg  1 mg Oral Q6H PRN Gonzella Lex, MD      . magnesium hydroxide (MILK OF MAGNESIA) suspension 30 mL  30 mL Oral Daily PRN Gonzella Lex, MD      . multivitamin with minerals tablet 1 tablet  1 tablet Oral Daily Gonzella Lex, MD   1 tablet at 04/20/16 0825  . pantoprazole (PROTONIX) EC tablet 40 mg  40 mg Oral Daily Gonzella Lex, MD   40 mg at 04/20/16 0825  . prazosin (MINIPRESS) capsule 1 mg  1 mg Oral BID Jolanta B Pucilowska, MD      . propranolol (INDERAL) tablet  10 mg  10 mg Oral TID Gonzella Lex, MD   10 mg at 04/20/16 [id number]  . spironolactone (ALDACTONE) tablet 100 mg  100 mg Oral Daily Gonzella Lex, MD   100 mg at 04/20/16 0825  . thiamine (VITAMIN B-1) tablet 100 mg  100 mg Oral Daily Gonzella Lex, MD   100 mg at 04/20/16 0825   Or  . thiamine (B-1) injection 100 mg  100 mg Intravenous Daily Gonzella Lex, MD       PTA Medications: Prescriptions Prior to Admission  Medication Sig Dispense Refill Last Dose  . B Complex Vitamins (VITAMIN B-COMPLEX PO) Take 1 tablet by mouth daily.   Taking  . citalopram (CELEXA) 20 MG tablet Take 20 mg by mouth daily.   Unknown at Unknown  . FLUoxetine (PROZAC) 20 MG tablet Take 1 tablet (20 mg total) by mouth daily. 30 tablet 3 Unknown at Unknown  . folic acid (FOLVITE) 1 MG tablet Take 1 tablet (1 mg total) by mouth daily. 90 tablet 1 Unknown at Unknown  . furosemide (LASIX) 40 MG tablet Take 1 tablet (40 mg total) by mouth daily. 30 tablet 11 Unknown at Unknown  . gabapentin (NEURONTIN) 100 MG capsule TAKE ONE CAPSULE BY MOUTH TWICE  DAILY 180 capsule 1 Unknown at Unknown  . lactulose (CHRONULAC) 10 GM/15ML solution Take 15 mLs (10 g total) by mouth 2 (two) times daily. (Patient not taking: Reported on 04/19/2016) 946 mL 3 Not Taking  . Multiple Vitamin (MULTIVITAMIN WITH MINERALS) TABS tablet Take 1 tablet by mouth daily.   Taking  . pantoprazole (PROTONIX) 40 MG tablet Take 1 tablet (40 mg total) by mouth daily. 30 tablet 2 Unknown at Unknown  . propranolol (INDERAL) 10 MG tablet Take 1 tablet (10 mg total) by mouth 3 (three) times daily. 90 tablet 11 Unknown at Unknown  . spironolactone (ALDACTONE) 100 MG tablet Take 1 tablet (100 mg total) by mouth 2 (two) times daily. 60 tablet 2 Unknown at Unknown  . vitamin E 400 UNIT capsule Take 400 Units by mouth daily.   Taking  . Vitamins/Minerals TABS Take by mouth.   Taking    Patient Stressors: Health problems Substance abuse  Patient Strengths: Ability for  insight Capable of independent living Supportive family/friends  Treatment Modalities: Medication Management, Group therapy, Case management,  1 to 1 session with clinician, Psychoeducation, Recreational therapy.   Physician Treatment Plan for Primary Diagnosis: Severe recurrent major depression without psychotic features ([place]) Long Term Goal(s): Improvement in symptoms so as ready for discharge Improvement in symptoms so as ready for discharge   Short Term Goals: Ability to identify changes in lifestyle to reduce recurrence of condition will improve Ability to disclose and discuss suicidal ideas Ability to identify and develop effective coping behaviors will improve Compliance with prescribed medications will improve Ability to identify changes in lifestyle to reduce recurrence of condition will improve Ability to demonstrate self-control will improve Ability to identify triggers associated with substance abuse/mental health issues will improve  Medication Management: Evaluate patient's response, side effects, and tolerance of medication regimen.  Therapeutic Interventions: 1 to 1 sessions, Unit Group sessions and Medication administration.  Evaluation of Outcomes: Adequate for Discharge  Physician Treatment Plan for Secondary Diagnosis: Principal Problem:   Severe recurrent major depression without psychotic features ([place]) Active Problems:   Essential hypertension   Alcohol use disorder, moderate, dependence (HCC)   Alcoholic cirrhosis (HCC)   GERD (gastroesophageal reflux disease)   PTSD (post-traumatic stress disorder)   OCD (obsessive compulsive disorder)  Long Term Goal(s): Improvement in symptoms so as ready for discharge Improvement in symptoms so as ready for discharge   Short Term Goals: Ability to identify changes in lifestyle to reduce recurrence of condition will improve Ability to disclose and discuss suicidal ideas Ability to identify and develop effective  coping behaviors will improve Compliance with prescribed medications will improve Ability to identify changes in lifestyle to reduce recurrence of condition will improve Ability to demonstrate self-control will improve Ability to identify triggers associated with substance abuse/mental health issues will improve     Medication Management: Evaluate patient's response, side effects, and tolerance of medication regimen.  Therapeutic Interventions: 1 to 1 sessions, Unit Group sessions and Medication administration.  Evaluation of Outcomes: Adequate for Discharge   RN Treatment Plan for Primary Diagnosis: Severe recurrent major depression without psychotic features ([place]) Long Term Goal(s): Knowledge of disease and therapeutic regimen to maintain health will improve  Short Term Goals: Ability to participate in decision making will improve, Ability to identify and develop effective coping behaviors will improve and Compliance with prescribed medications will improve  Medication Management: RN will administer medications as ordered by provider, will assess and evaluate patient's response and provide education to patient  for prescribed medication. RN will report any adverse and/or side effects to prescribing provider.  Therapeutic Interventions: 1 on 1 counseling sessions, Psychoeducation, Medication administration, Evaluate responses to treatment, Monitor vital signs and CBGs as ordered, Perform/monitor CIWA, COWS, AIMS and Fall Risk screenings as ordered, Perform wound care treatments as ordered.  Evaluation of Outcomes: Adequate for Discharge   LCSW Treatment Plan for Primary Diagnosis: Severe recurrent major depression without psychotic features ([place]) Long Term Goal(s): Safe transition to appropriate next level of care at discharge, Engage patient in therapeutic group addressing interpersonal concerns.  Short Term Goals: Engage patient in aftercare planning with referrals and resources,  Increase social support, Facilitate acceptance of mental health diagnosis and concerns and Increase skills for wellness and recovery  Therapeutic Interventions: Assess for all discharge needs, 1 to 1 time with Social worker, Explore available resources and support systems, Assess for adequacy in community support network, Educate family and significant other(s) on suicide prevention, Complete Psychosocial Assessment, Interpersonal group therapy.  Evaluation of Outcomes: Adequate for Discharge    Recreational Therapy Treatment Plan for Primary Diagnosis: Severe recurrent major depression without psychotic features ([place]) Long Term Goal(s): Interest or engagement in recreation therapeutic interventions will improve  Short Term Goals: Increase self-esteem, Increase stress management skills  Treatment Modalities: Group Therapy and Individual Treatment Sessions  Therapeutic Interventions: Psychoeducation  Evaluation of Outcomes: Adequate for Discharge   Progress in Treatment: Attending groups: Yes. Participating in groups: Yes. Taking medication as prescribed: Yes. Toleration medication: Yes. Family/Significant other contact made: No, will contact:  daughter Patient understands diagnosis: Yes. Discussing patient identified problems/goals with staff: Yes. Medical problems stabilized or resolved: Yes. Denies suicidal/homicidal ideation: Yes. Issues/concerns per patient self-inventory: No. Other: none  New problem(s) identified: Yes, Describe:  conflict with mother in the home. Plan for family meeting.  New Short Term/Long Term Goal(s):none  Discharge Plan or Barriers: Follow up with outpatient services at [facility].  Reason for Continuation of Hospitalization: Other; describe plan to discharge pt tomorrow.  Estimated Length of Stay: 1 day.  Attendees: Patient: Whitney Bush 04/20/2016 12:47 PM  Physician: Dr. Bary Leriche, MD 04/20/2016 12:47 PM  Nursing: Polly Cobia, RN 04/20/2016 12:47 PM  RN Care Manager: 04/20/2016 12:47 PM  Social Worker: Lurline Idol, LCSW 04/20/2016 12:47 PM  Recreational Therapist: Drue Flirt, LRT/CTRS  04/20/2016 12:47 PM  Other:  04/20/2016 12:47 PM  Other:  04/20/2016 12:47 PM  Other: 04/20/2016 12:47 PM     Scribe for Treatment Team: Joanne Chars, [place] 04/20/2016 1:02 PM

## 2016-04-20 NOTE — Progress Notes (Signed)
D:Affect flat and depressed . Limited interaction with peers and staff. Isolative to room  Compliant with unit programing  And medication . Patient said she was unable to talk about the lost of her husband .Stated ahe is not drinking ETOH, living  With her mother.  Patient was able to meet with the chaplin today . Stated she felt better about her being here. Patient stated slept fair last night .Stated appetite is good and energy level low. Stated concentration is good . Stated on Depression scale 3, hopeless 5 and anxiety 4.( low 0-10 high) Denies suicidal  homicidal ideations  .  No auditory hallucinations  No pain concerns . Appropriate ADL'S. Interacting with peers and staff.  A: Encourage patient participation with unit programming . Instruction  Given on  Medication , verbalize understanding. R: Voice no other concerns. Staff continue to monitor

## 2016-04-20 NOTE — BHH Suicide Risk Assessment (Signed)
[facility] Admission Suicide Risk Assessment   Nursing information obtained from:  Family, Review of record Demographic factors:  Low socioeconomic status, Unemployed Current Mental Status:  Suicidal ideation indicated by others, Self-harm thoughts Loss Factors:  Decline in physical health, Financial problems / change in socioeconomic status Historical Factors:  Anniversary of important loss (Husband died in [place]) Risk Reduction Factors:  Sense of responsibility to family, Living with another person, especially a relative, Positive therapeutic relationship  Total Time spent with patient: 1 hour Principal Problem: Severe recurrent major depression without psychotic features ([place]) Diagnosis:   Patient Active Problem List   Diagnosis Date Noted  . Severe recurrent major depression without psychotic features ([place]) [F33.2] 04/19/2016  . GERD (gastroesophageal reflux disease) [K21.9] 03/09/2016  . Situational depression [F43.21] 02/07/2016  . Bilateral edema of lower extremity [R60.0]   . History of colonic polyps [Z86.010]   . Alcoholic cirrhosis of liver with ascites ([place]) [K70.31]   . Anasarca [R60.1] 02/16/2015  . Alcoholic hepatitis with ascites [K70.11]   . Ascites [R18.8]   . Bleeding gastrointestinal [K92.2]   . AKI (acute kidney injury) ([place]) [N17.9] 12/10/2014  . Volume overload [E87.70] 12/10/2014  . Cirrhosis ([place]) [K74.60]   . Elevated bilirubin [R17]   . Nausea with vomiting [R11.2]   . Diarrhea [R19.7]   . Absolute anemia [D64.9] 12/02/2014  . Jaundice [R17] 12/02/2014  . Essential hypertension [I10] 12/02/2014  . Alcohol use disorder, moderate, dependence ([place]) [F10.20] 12/02/2014  . Abdominal pain [R10.9] 12/02/2014   Subjective Data: depression, alcoholism.  Continued Clinical Symptoms:  Alcohol Use Disorder Identification Test Final Score (AUDIT): 21 The "Alcohol Use Disorders Identification Test", Guidelines for Use in Primary Care, Second Edition.  World  Pharmacologist [facility]). Score between 0-7:  no or low risk or alcohol related problems. Score between 8-15:  moderate risk of alcohol related problems. Score between 16-19:  high risk of alcohol related problems. Score 20 or above:  warrants further diagnostic evaluation for alcohol dependence and treatment.   CLINICAL FACTORS:   Depression:   Comorbid alcohol abuse/dependence Impulsivity Alcohol/Substance Abuse/Dependencies   Musculoskeletal: Strength & Muscle Tone: within normal limits Gait & Station: normal Patient leans: N/A  Psychiatric Specialty Exam: Physical Exam  Nursing note and vitals reviewed. Psychiatric: She has a normal mood and affect. Her speech is normal and behavior is normal. Thought content normal. Cognition and memory are normal. She expresses impulsivity.    Review of Systems  Psychiatric/Behavioral: Positive for depression.  All other systems reviewed and are negative.   Blood pressure 106/67, pulse 75, temperature 98.8 F (37.1 C), resp. rate 18, height 5\' 3"  (1.6 m), weight 99.8 kg (220 lb), SpO2 100 %.Body mass index is 38.97 kg/m.  General Appearance: Casual  Eye Contact:  Good  Speech:  Clear and Coherent  Volume:  Normal  Mood:  Depressed  Affect:  Blunt  Thought Process:  Goal Directed and Descriptions of Associations: Intact  Orientation:  Full (Time, Place, and Person)  Thought Content:  WDL  Suicidal Thoughts:  No  Homicidal Thoughts:  No  Memory:  Immediate;   Fair Recent;   Fair Remote;   Fair  Judgement:  Impaired  Insight:  Shallow  Psychomotor Activity:  Normal  Concentration:  Concentration: Fair and Attention Span: Fair  Recall:  [employer] of Knowledge:  Fair  Language:  Fair  Akathisia:  No  Handed:  Right  AIMS (if indicated):     Assets:  Communication  Skills Desire for Improvement Housing Physical Health Resilience Social Support  ADL's:  Intact  Cognition:  WNL  Sleep:  Number of Hours: 4.3       COGNITIVE FEATURES THAT CONTRIBUTE TO RISK:  None    SUICIDE RISK:   Minimal: No identifiable suicidal ideation.  Patients presenting with no risk factors but with morbid ruminations; may be classified as minimal risk based on the severity of the depressive symptoms  PLAN OF CARE: Hospital admission, medication management, substance abuse counseling, discharge planning.  Ms. Whitney Bush is a 46 year old female with a history of untreated depression and alcoholism admitted for worsening of her symptoms.  1. Suicidal ideation. The patient adamantly denies thoughts, intention or plans to hurt herself or others. She is able to contract for safety. She is forward thinking and optimistic about the future. She is a loving daughter and mother.   2. Mood and anxiety. We started Prozac for depression and Minipress for PTSD type symptoms.  3. Alcohol abuse. There is a long history of alcoholism with resulting liver cirrhosis. The patient claims to be sober for the past year. Her blood alcohol level on admission was 200. She was offered alcohol detox. Vital signs are stable.  4. Liver cirrhosis. She is on Lasix, Aldactone, and propranolol. Her ammonia level is 47. We continued lactulose.  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  6. GERD. She is on Protonix.  7. Metabolic syndrome monitoring. Lipid panel and an and TSH are normal. Hemoglobin A1c is pending.  8. EKG. Normal sinus rhythm. QTC 432.  9. Substance abuse treatment. The patient minimizes her problems and declines residential substance abuse treatment program participation or pharmacotherapy for alcoholism.  10. Disposition. She will be discharged to home with family after a family meeting. She will follow up with [facility].   I certify that inpatient services furnished can reasonably be expected to improve the patient's condition.   Orson Slick, MD 04/20/2016, 9:40 AM

## 2016-04-20 NOTE — Progress Notes (Signed)
Admission Note:   Patient's skin was assessed; warm, dry and intact,  found to have some darks spot all over the chest area; front and back and patient stated " l have bad acne" Patient was searched and no contraband found, Pt had no additional questions or concerns, will continue to monitor.

## 2016-04-20 NOTE — BHH Group Notes (Signed)
[place] LCSW Group Therapy  04/20/2016 11:06 AM  Type of Therapy:  Group Therapy  Participation Level:  Active  Participation Quality:  Appropriate  Affect:  Appropriate  Cognitive:  Alert  Insight:  Engaged  Engagement in Therapy:  Engaged  Modes of Intervention:  Activity, Discussion, Education, Problem-solving, Art therapist and Support  Summary of Progress/Problems: Balance in life: Patients will discuss the concept of balance and how it looks and feels to be unbalanced. Pt will identify areas in their life that is unbalanced and ways to become more balanced. They discussed what aspects in their lives has influenced their self care. Patients also discussed self care in the areas of self regulation/control, hygiene/appearance, sleep/relaxation, healthy leisure, healthy eating habits, exercise, inner peace/spirituality, self improvement, sobriety, and health management. They were challenged to identify changes that are needed in order to improve self care. Patient stated she wants to work on her anger because it has effected almost every aspect of her life. CSW provided support to patient and discussed ways to manage her anger. Patient had to leave group to speak with MD and did not return.    Kenly Henckel G. [place], [place] 04/20/2016, 11:13 AM

## 2016-04-20 NOTE — Progress Notes (Signed)
Whitney Bush responded to the OR for a Pt in [place] who had Suicide thoughts. CH met with the Pt. The Pt spoke about the loss of her husband and the pain that followed that loss, the loss of the things she received form her husband, the loss of self-esteem caused by her mother talking her down, and the struggle she experiencing  finding a job. Patient has no suicide thoughts, patient is not depressed, but Pt stated she has anger that has put her on cross roads with her mother and daughter. Whitney Bush observes that the Pt could benefit from anger management program as that will help the Pt to take control of her anger outbursts.    04/20/16 2000  Clinical Encounter Type  Visited With Patient  Visit Type Initial;Psychological support  Referral From Nurse  Consult/Referral To Chaplain  Spiritual Encounters  Spiritual Needs Emotional;Other (Comment)

## 2016-04-20 NOTE — Progress Notes (Signed)
Recreation Therapy Notes  INPATIENT RECREATION THERAPY ASSESSMENT  Patient Details Name: Whitney Bush MRN: [id number] DOB: [birth year] Today's Date: 04/20/2016  Patient Stressors: Family, Death, Other (Comment) (Mom can be hateful; Husband died 3 years ago; living with mom and having her own place)  Coping Skills:   Isolate, Arguments, Substance Abuse, Avoidance, Art/Dance, Music, Sports  Personal Challenges: Anger, Communication, Concentration, Decision-Making, Expressing Yourself, Problem-Solving, Relationships, Self-Esteem/Confidence, Social Interaction, Stress Management, Trusting Others  Leisure Interests (2+):  Individual - Other (Comment) (Do hair, spend time with her child)  Awareness of Community Resources:  No  Community Resources:     Current Use:    If no, Barriers?:    Patient Strengths:  Eyes, way she treats herself as a person  Patient Identified Areas of Improvement:  Weight, education  Current Recreation Participation:  Nothing  Patient Goal for Hospitalization:  To get better  [place] of Residence:  [place] of Residence:  [place]   Current SI (including self-harm):  No  Current HI:  No  Consent to Intern Participation: N/A   Leonette Monarch, LRT/CTRS 04/20/2016, 3:37 PM

## 2016-04-20 NOTE — BHH Group Notes (Signed)
[place] LCSW Group Therapy Note  Type of Therapy and Topic:  Group Therapy:  Goals Group: SMART Goals  Participation Level:  Patient attended group on this date. Patient participated in goal setting and was able to share openly with the group.   Description of Group:   The purpose of a daily goals group is to assist and guide patients in setting recovery/wellness-related goals.  The objective is to set goals as they relate to the crisis in which they were admitted. Patients will be using SMART goal modalities to set measurable goals.  Characteristics of realistic goals will be discussed and patients will be assisted in setting and processing how one will reach their goal. Facilitator will also assist patients in applying interventions and coping skills learned in psycho-education groups to the SMART goal and process how one will achieve defined goal.  Therapeutic Goals: -Patients will develop and document one goal related to or their crisis in which brought them into treatment. -Patients will be guided by LCSW using SMART goal setting modality in how to set a measurable, attainable, realistic and time sensitive goal.  -Patients will process barriers in reaching goal. -Patients will process interventions in how to overcome and successful in reaching goal.   Summary of Patient Progress:  Patient Goal: "I want to work on my anger".  CSW provided support for patient and discussed anger management techniques.    Therapeutic Modalities:   Motivational Interviewing Public relations account executive Therapy Crisis Intervention Model SMART goals setting  Travis Mastel G. [place], [facility] 04/20/2016 10:26 AM
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
## 2016-04-20 NOTE — Progress Notes (Signed)
Recreation Therapy Notes  Date: 02.22.18 Time: 1:00 pm Location: Craft Room  Group Topic: Leisure Education  Goal Area(s) Addresses:  Patient will identify activities for each letter of the alphabet. Patient will verbalize ability to integrate positive leisure into life post d/c. Patient will verbalize ability to use leisure as a Technical sales engineer.  Behavioral Response: Attentive, Interactive  Intervention: Leisure Alphabet  Activity: Patients were given a Leisure Air traffic controller and were instructed to write healthy leisure activities for each letter of the alphabet.  Education: LRT educated patients on what they need to participate in leisure.  Education Outcome: Acknowledges education/In group clarification offered   Clinical Observations/Feedback: Patient wrote healthy leisure activities. Patient contributed to group discussion by stating some of her healthy leisure activities, and how she can integrate leisure into her schedule.  Leonette Monarch, LRT/CTRS 04/20/2016 2:12 PM

## 2016-04-20 NOTE — Tx Team (Signed)
Initial Treatment Plan 04/20/2016 4:29 AM Whitney Bush [id number]    PATIENT STRESSORS: Health problems Substance abuse   PATIENT STRENGTHS: Ability for insight Capable of independent living Supportive family/friends   PATIENT IDENTIFIED PROBLEMS: Mood Instability  Alcohol Abuse  Anemia --(Depleted Vitamins r/t ETOH abuse)  Complex Medical Problems               DISCHARGE CRITERIA:  Adequate post-discharge living arrangements Improved stabilization in mood, thinking, and/or behavior Verbal commitment to aftercare and medication compliance  PRELIMINARY DISCHARGE PLAN: Outpatient therapy Return to previous living arrangement  PATIENT/FAMILY INVOLVEMENT: This treatment plan has been presented to and reviewed with the patient, Whitney Bush.  The patient and family have been given the opportunity to ask questions and make suggestions.  Electa Sniff, RN 04/20/2016, 4:29 AM

## 2016-04-20 NOTE — Plan of Care (Signed)
Problem: Activity: Goal: Sleeping patterns will improve Outcome: Progressing Patient slept for Estimated Hours of 4.30; every 15 minutes safety round maintained, no injury or falls during this shift.    
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
## 2016-04-20 NOTE — Progress Notes (Addendum)
Patient ID: Whitney Bush, female   DOB: [birth year], 46 y.o.   MRN: [id number] "My husband died in 02/25/15 and I have been depressed since, I don't know if I called 911 on myself; but why would I do that? It just doesn't make sense that I IVC myself! I might have..." This is the first psychiatric admission; patient, according to the IVC papers, has attempted suicide in the past, patient was evasive about it and would not elaborate. PMHx significant for Chronicity of ETOH consumption: BAL=203, hyponatremia, elevated ammonia level = 46, depleted Vitamins, "I don't drink beer, I drink Vodka and Liquor .." Denied SI/SIB; CIWA=0 at [place], Asymptomatic at this time. Patient resides at home with mother and children. Initial Assessment completed, cooperative, calm, asked relevant questions, pleasant, no delirium tremens, logical, polite, calm, lucid, appropriate, no impaired gait. Beverages provided, Unit orientation completed and allowed to her room.

## 2016-04-20 NOTE — H&P (Signed)
Psychiatric Admission Assessment Adult  Patient Identification: Whitney Bush MRN:  [id number] Date of Evaluation:  04/20/2016 Chief Complaint:  Depression Principal Diagnosis: Severe recurrent major depression without psychotic features [facility]) Diagnosis:   Patient Active Problem List   Diagnosis Date Noted  . PTSD (post-traumatic stress disorder) [F43.10] 04/20/2016  . OCD (obsessive compulsive disorder) [F42.9] 04/20/2016  . Severe recurrent major depression without psychotic features ([place]) [F33.2] 04/19/2016  . GERD (gastroesophageal reflux disease) [K21.9] 03/09/2016  . Situational depression [F43.21] 02/07/2016  . Bilateral edema of lower extremity [R60.0]   . History of colonic polyps [Z86.010]   . Alcoholic cirrhosis of liver with ascites ([place]) [K70.31]   . Anasarca [R60.1] 02/16/2015  . Alcoholic hepatitis with ascites [K70.11]   . Ascites [R18.8]   . Bleeding gastrointestinal [K92.2]   . AKI (acute kidney injury) ([place]) [N17.9] 12/10/2014  . Volume overload [E87.70] 12/10/2014  . Alcoholic cirrhosis ([place]) Q000111Q   . Elevated bilirubin [R17]   . Nausea with vomiting [R11.2]   . Diarrhea [R19.7]   . Absolute anemia [D64.9] 12/02/2014  . Jaundice [R17] 12/02/2014  . Essential hypertension [I10] 12/02/2014  . Alcohol use disorder, moderate, dependence ([place]) [F10.20] 12/02/2014  . Abdominal pain [R10.9] 12/02/2014   History of Present Illness:   Identifying data. Miss Whitney Bush is a 46 year old female with a history of untreated depression and alcoholism.  Chief complaint. "I don't know what happened."  History of present illness. Information was obtained from the patient and the chart. The patient was brought to the emergency room by the police. There is information in the chart that the patient called the police herself but she denies and believes that someone else must have done it. She denies being agitated, argumentative, voicing suicidal threats. She had a few drinks  per day and her blood alcohol level was 200. The patient admits that since her husband passed away 3 years ago she has not been herself. She feels depressed with poor sleep, decreased appetite, anhedonia, feeling of guilt and hopelessness worthlessness, poor energy and concentration, social isolation, and crying spells. She adamantly denies that she has ever felt suicidal or attempted suicide. She just feels very sad and has not been able to have any conversation about her husband passing away with anybody. She developed symptoms suggestive of PTSD with nightmares and flashbacks every time she sees something on TV or hears a conversation involving her husband. She denies psychotic symptoms or symptoms suggestive of bipolar mania. She reports infrequent panic attacks, social anxiety as she has always been a loner, PTSD symptoms and OCD symptoms with excessive cleaning and preoccupation with bacteria. On the day of admission she recalls that she had an argument with her mother after the patient threw away because the mother has been coughing around it. She has a history of that alcohol is with resulting alcoholic liver disease but claims that she has been sober for a year. She was however positive for alcohol on admission.  Past psychiatric history. She reports that she started getting depressed when she was in high school and received some therapy for it but has never been treated with medications. She has never been hospitalized for mental illness. There were no suicide attempts.  Family psychiatric history. There are family members with depression and bipolar illness.  Social history. 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 After her husband passed away 3 years ago she moved in with her mother and her stepfather. This is  pretty stressful for her. She has a 73 year old daughter who works as a Quarry manager and who supports her. She has Medicaid.  Total Time spent with patient: 1  hour  Is the patient at risk to self? No.  Has the patient been a risk to self in the past 6 months? No.  Has the patient been a risk to self within the distant past? No.  Is the patient a risk to others? No.  Has the patient been a risk to others in the past 6 months? No.  Has the patient been a risk to others within the distant past? No.   Prior Inpatient Therapy:   Prior Outpatient Therapy:    Alcohol Screening: 1. How often do you have a drink containing alcohol?: 4 or more times a week 2. How many drinks containing alcohol do you have on a typical day when you are drinking?: 5 or 6 (The amount decleared by patient may be under-estimated, BAL=203, Ammonia level = 56) 3. How often do you have six or more drinks on one occasion?: Never ("I don't drink Beer, I drink Vodka.Marland Kitchen..") Preliminary Score: 2 4. How often during the last year have you found that you were not able to stop drinking once you had started?: Daily or almost daily ("My husband died in 03/16/15...") 5. How often during the last year have you failed to do what was normally expected from you becasue of drinking?: Weekly 6. How often during the last year have you needed a first drink in the morning to get yourself going after a heavy drinking session?: Never 7. How often during the last year have you had a feeling of guilt of remorse after drinking?: Never 8. How often during the last year have you been unable to remember what happened the night before because you had been drinking?: Daily or almost daily ("This may explain why I did not remember calling the Cops on myself ....") 9. Have you or someone else been injured as a result of your drinking?: No 10. Has a relative or friend or a doctor or another health worker been concerned about your drinking or suggested you cut down?: Yes, during the last year Alcohol Use Disorder Identification Test Final Score (AUDIT): 21 Brief Intervention: Yes Substance Abuse History in  the last 12 months:  Yes.   Consequences of Substance Abuse: Negative Previous Psychotropic Medications: No  Psychological Evaluations: No  Past Medical History:  Past Medical History:  Diagnosis Date  . Alcohol abuse   . 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 Anemia   . Blood transfusion without reported diagnosis   . Boil 06/04/2015  . Chronic kidney disease   . Depression   . GERD (gastroesophageal reflux disease)   . GI (gastrointestinal bleed) 07/28/2015  . Heart murmur   . Hyperlipidemia   . Hypertension   . Migraines   . Peripheral edema   . Tubular adenoma     Past Surgical History:  Procedure Laterality Date  . ABCESS DRAINAGE     x5; neck, arm, chest, back  . BIOPSY  02/24/2015   Procedure: BIOPSY;  Surgeon: Danie Binder, MD;  [facility];  Service: Endoscopy;;  gastric bx's  . CESAREAN SECTION    . COLONOSCOPY WITH PROPOFOL N/A 06/28/2015   Dr.Rourk- inadequate prep- One 6 mm polyp at the splenic flexure bx= tubular adenoma  . ESOPHAGOGASTRODUODENOSCOPY (EGD) WITH PROPOFOL N/A  02/24/2015   SLF: Grade II esophageal varices Moderate portal hypertensive gastropathy Mild erosive gastritis anemia likely due to many factors: gastritis, gastropathy, coagulopathy, chronic disease  . POLYPECTOMY  06/28/2015   Procedure: POLYPECTOMY;  Surgeon: Daneil Dolin, MD;  Location: [facility];  Service: Endoscopy;;  at splenic flexure   Family History:  Family History  Problem Relation Age of Onset  . Diabetes Father   . Hyperlipidemia Father   . Alcohol abuse Father   . Cancer Other   . Cervical cancer Maternal Grandmother   . Lung cancer Maternal Grandfather   . Alcohol abuse Other     multiple family members  . Diabetes Sister   . Hypertension Brother   . Colon cancer Neg Hx   . Liver disease Neg Hx     Tobacco Screening: Have you used any form of tobacco in the last 30 days? (Cigarettes, Smokeless Tobacco, Cigars, and/or  Pipes): No Social History:  History  Alcohol Use No    Comment: Last drink April 2017     History  Drug Use No    Additional Social History:                           Allergies:  No Known Allergies Lab Results:  Results for orders placed or performed during the hospital encounter of 04/19/16 (from the past 48 hour(s))  Lipid panel     Status: Abnormal   Collection Time: 04/20/16  6:44 AM  Result Value Ref Range   Cholesterol 150 0 - 200 mg/dL   Triglycerides 91 <150 mg/dL   HDL 40 (L) >40 mg/dL   Total CHOL/HDL Ratio 3.8 RATIO   VLDL 18 0 - 40 mg/dL   LDL Cholesterol 92 0 - 99 mg/dL    Comment:        Total Cholesterol/HDL:CHD Risk Coronary Heart Disease Risk Table                     Men   Women  1/2 Average Risk   3.4   3.3  Average Risk       5.0   4.4  2 X Average Risk   9.6   7.1  3 X Average Risk  23.4   11.0        Use the calculated Patient Ratio above and the CHD Risk Table to determine the patient's CHD Risk.        ATP III CLASSIFICATION (LDL):  <100     mg/dL   Optimal  100-129  mg/dL   Near or Above                    Optimal  130-159  mg/dL   Borderline  160-189  mg/dL   High  >190     mg/dL   Very High   TSH     Status: None   Collection Time: 04/20/16  6:44 AM  Result Value Ref Range   TSH 3.392 0.350 - 4.500 uIU/mL    Comment: Performed by a 3rd Generation assay with a functional sensitivity of <=0.01 uIU/mL.    Blood Alcohol level:  Lab Results  Component Value Date   ETH 203 (H) 04/19/2016   ETH <5 [id number]    Metabolic Disorder Labs:  No results found for: HGBA1C, MPG No results found for: PROLACTIN Lab Results  Component Value Date   CHOL 150 04/20/2016   TRIG  91 04/20/2016   HDL 40 (L) 04/20/2016   CHOLHDL 3.8 04/20/2016   VLDL 18 04/20/2016   LDLCALC 92 04/20/2016    Current Medications: Current Facility-Administered Medications  Medication Dose Route Frequency Provider Last Rate Last Dose  . acetaminophen  (TYLENOL) tablet 650 mg  650 mg Oral Q6H PRN Gonzella Lex, MD      . alum & mag hydroxide-simeth (MAALOX/MYLANTA) 200-200-20 MG/5ML suspension 30 mL  30 mL Oral Q4H PRN Gonzella Lex, MD      . FLUoxetine (PROZAC) capsule 20 mg  20 mg Oral Daily Gonzella Lex, MD   20 mg at 04/20/16 0825  . folic acid (FOLVITE) tablet 1 mg  1 mg Oral Daily Gonzella Lex, MD   1 mg at 04/20/16 0825  . furosemide (LASIX) tablet 40 mg  40 mg Oral Daily Gonzella Lex, MD   40 mg at 04/20/16 0825  . gabapentin (NEURONTIN) capsule 100 mg  100 mg Oral BID Gonzella Lex, MD   100 mg at 04/20/16 0825  . lactulose (CHRONULAC) 10 GM/15ML solution 10 g  10 g Oral BID Gonzella Lex, MD   10 g at 04/20/16 0818  . LORazepam (ATIVAN) tablet 1 mg  1 mg Oral Q6H PRN Gonzella Lex, MD      . magnesium hydroxide (MILK OF MAGNESIA) suspension 30 mL  30 mL Oral Daily PRN Gonzella Lex, MD      . multivitamin with minerals tablet 1 tablet  1 tablet Oral Daily Gonzella Lex, MD   1 tablet at 04/20/16 0825  . pantoprazole (PROTONIX) EC tablet 40 mg  40 mg Oral Daily Gonzella Lex, MD   40 mg at 04/20/16 0825  . prazosin (MINIPRESS) capsule 1 mg  1 mg Oral BID Fatisha Rabalais B Doran Nestle, MD      . propranolol (INDERAL) tablet 10 mg  10 mg Oral TID Gonzella Lex, MD   10 mg at 04/20/16 [id number]  . spironolactone (ALDACTONE) tablet 100 mg  100 mg Oral Daily Gonzella Lex, MD   100 mg at 04/20/16 0825  . thiamine (VITAMIN B-1) tablet 100 mg  100 mg Oral Daily Gonzella Lex, MD   100 mg at 04/20/16 0825   Or  . thiamine (B-1) injection 100 mg  100 mg Intravenous Daily Gonzella Lex, MD       PTA Medications: Prescriptions Prior to Admission  Medication Sig Dispense Refill Last Dose  . B Complex Vitamins (VITAMIN B-COMPLEX PO) Take 1 tablet by mouth daily.   Taking  . citalopram (CELEXA) 20 MG tablet Take 20 mg by mouth daily.   Unknown at Unknown  . FLUoxetine (PROZAC) 20 MG tablet Take 1 tablet (20 mg total) by mouth daily. 30  tablet 3 Unknown at Unknown  . folic acid (FOLVITE) 1 MG tablet Take 1 tablet (1 mg total) by mouth daily. 90 tablet 1 Unknown at Unknown  . furosemide (LASIX) 40 MG tablet Take 1 tablet (40 mg total) by mouth daily. 30 tablet 11 Unknown at Unknown  . gabapentin (NEURONTIN) 100 MG capsule TAKE ONE CAPSULE BY MOUTH TWICE DAILY 180 capsule 1 Unknown at Unknown  . lactulose (CHRONULAC) 10 GM/15ML solution Take 15 mLs (10 g total) by mouth 2 (two) times daily. (Patient not taking: Reported on 04/19/2016) 946 mL 3 Not Taking  . Multiple Vitamin (MULTIVITAMIN WITH MINERALS) TABS tablet Take 1 tablet by mouth daily.   Taking  .  pantoprazole (PROTONIX) 40 MG tablet Take 1 tablet (40 mg total) by mouth daily. 30 tablet 2 Unknown at Unknown  . propranolol (INDERAL) 10 MG tablet Take 1 tablet (10 mg total) by mouth 3 (three) times daily. 90 tablet 11 Unknown at Unknown  . spironolactone (ALDACTONE) 100 MG tablet Take 1 tablet (100 mg total) by mouth 2 (two) times daily. 60 tablet 2 Unknown at Unknown  . vitamin E 400 UNIT capsule Take 400 Units by mouth daily.   Taking  . Vitamins/Minerals TABS Take by mouth.   Taking    Musculoskeletal: Strength & Muscle Tone: within normal limits Gait & Station: normal Patient leans: N/A  Psychiatric Specialty Exam: I reviewed physical exam performed in the emergency room and agree with the findings. Physical Exam  Nursing note and vitals reviewed. Psychiatric: She has a normal mood and affect. Her speech is normal and behavior is normal. Thought content normal. Cognition and memory are normal. She expresses impulsivity.    Review of Systems  Psychiatric/Behavioral: Positive for depression. The patient has insomnia.   All other systems reviewed and are negative.   Blood pressure 106/67, pulse 75, temperature 98.8 F (37.1 C), resp. rate 18, height 5\' 3"  (1.6 m), weight 99.8 kg (220 lb), SpO2 100 %.Body mass index is 38.97 kg/m.  See SRA.                                                   Sleep:  Number of Hours: 4.3    Treatment Plan Summary: Daily contact with patient to assess and evaluate symptoms and progress in treatment and Medication management   Ms. Brester is a 46 year old female with a history of untreated depression and alcoholism admitted for worsening of her symptoms.  1. Suicidal ideation. The patient adamantly denies thoughts, intention or plans to hurt herself or others. She is able to contract for safety. She is forward thinking and optimistic about the future. She is a loving daughter and mother.   2. Mood and anxiety. We started Prozac for depression and Minipress for PTSD type symptoms.  3. Alcohol abuse. There is a long history of alcoholism with resulting liver cirrhosis. The patient claims to be sober for the past year. Her blood alcohol level on admission was 200. She was offered alcohol detox. Vital signs are stable.  4. Liver cirrhosis. She is on Lasix, Aldactone, and propranolol. Her ammonia level is 47. We continued lactulose.  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  6. GERD. She is on Protonix.  7. Metabolic syndrome monitoring. Lipid panel and an and TSH are normal. Hemoglobin A1c is pending.  8. EKG. Normal sinus rhythm. QTC 432.  9. Substance abuse treatment. The patient minimizes her problems and declines residential substance abuse treatment program participation or pharmacotherapy for alcoholism.  10. Disposition. She will be discharged to home with family after a family meeting. She will follow up with [facility].    Observation Level/Precautions:  15 minute checks  Laboratory:  CBC Chemistry Profile UDS UA  Psychotherapy:    Medications:    Consultations:    Discharge Concerns:    Estimated LOS:  Other:     Physician Treatment Plan for Primary Diagnosis: Severe recurrent major depression without psychotic features ([place]) Long Term Goal(s): Improvement in symptoms so as  ready for discharge  Short Term  Goals: Ability to identify changes in lifestyle to reduce recurrence of condition will improve, Ability to disclose and discuss suicidal ideas, Ability to identify and develop effective coping behaviors will improve and Compliance with prescribed medications will improve  Physician Treatment Plan for Secondary Diagnosis: Principal Problem:   Severe recurrent major depression without psychotic features ([place]) Active Problems:   Essential hypertension   Alcohol use disorder, moderate, dependence (HCC)   Alcoholic cirrhosis (HCC)   GERD (gastroesophageal reflux disease)   PTSD (post-traumatic stress disorder)   OCD (obsessive compulsive disorder)  Long Term Goal(s): Improvement in symptoms so as ready for discharge  Short Term Goals: Ability to identify changes in lifestyle to reduce recurrence of condition will improve, Ability to demonstrate self-control will improve and Ability to identify triggers associated with substance abuse/mental health issues will improve  I certify that inpatient services furnished can reasonably be expected to improve the patient's condition.    Orson Slick, MD 2/22/201811:53 AM

## 2016-04-21 LAB — HEMOGLOBIN A1C
HEMOGLOBIN A1C: 5 % (ref 4.8–5.6)
MEAN PLASMA GLUCOSE: 97

## 2016-04-21 MED ORDER — TRAZODONE HCL 100 MG PO TABS: 100.0000 mg | ORAL_TABLET | Freq: Every day | ORAL | 1 refills | Status: DC

## 2016-04-21 NOTE — Plan of Care (Signed)
Problem: [facility] Participation in Recreation Therapeutic Interventions Goal: STG-Patient will demonstrate improved self esteem by identif STG: Self-Esteem - Within 3 treatment sessions, patient will verbalize at least 5 positive affirmation statements in one treatment sessions to increase self-esteem.  Outcome: Completed/Met Date Met: 04/21/16 Treatment Session 1; Completed 1 out of 1: At approximately 8:50 am, LRT met with patient in consultation room. Patient verbalized 5 positive affirmation statements. Patient reported it felt "good". LRT encouraged patient to continue saying positive affirmation statements.  Leonette Monarch, LRT/CTRS 02.23.18 2:26 pm Goal: STG-Other Recreation Therapy Goal (Specify) STG: Stress Management - Within 3 treatment sessions, patient will verbalize understanding of the stress management techniques in one treatment session to increase stress management skills.  Outcome: Completed/Met Date Met: 04/21/16 Treatment Session 1; Completed 1 out of 1: At approximately 8:50 am, LRT met with patient in consultation room. LRT educated and provided patient with handouts on stress management techniques. Patient verbalized understanding. LRT encouraged patient to read over and practice the stress management techniques.  Leonette Monarch, LRT/CTRS 02.23.18 2:27 pm

## 2016-04-21 NOTE — BHH Group Notes (Signed)
[place] Group Notes:  (Nursing/MHT/Case Management/Adjunct)  Date:  04/21/2016  Time:  12:02 AM  Type of Therapy:  Psychoeducational Skills  Participation Level:  Active  Participation Quality:  Appropriate  Affect:  Appropriate  Cognitive:  Appropriate  Insight:  Good  Engagement in Group:  Engaged  Modes of Intervention:  Activity  Summary of Progress/Problems:  Whitney Bush 04/21/2016, 12:02 AM

## 2016-04-21 NOTE — Progress Notes (Signed)
  [facility] Adult Case Management Discharge Plan :  Will you be returning to the same living situation after discharge:  Yes,  with mother At discharge, do you have transportation home?: Yes,  mother, daughter Do you have the ability to pay for your medications: Yes,  medicaid  Release of information consent forms completed and in the chart;  Patient's signature needed at discharge.  Patient to Follow up at: Follow-up Information    [facility]. Go on 04/25/2016.   Why:  Please attend your follow up appointment at [facility] on 04/25/16.  Walk in hours are 8am-3pm, but it is recommended that you come between 8-9am.  Please bring photo ID, social security card, medicaid card, and a copy of your hospital discharge paperwork. Contact information: [address]  [place] [address] [phone]           Next level of care provider has access to [place] and Suicide Prevention discussed: Yes,  with daughter  Have you used any form of tobacco in the last 30 days? (Cigarettes, Smokeless Tobacco, Cigars, and/or Pipes): No  Has patient been referred to the Quitline?: N/A patient is not a smoker  Patient has been referred for addiction treatment: Yes  Joanne Chars, [place] 04/21/2016, 8:19 AM

## 2016-04-21 NOTE — BHH Group Notes (Signed)
[facility] LCSW Group Therapy  04/21/2016 11:00 AM  Type of Therapy:  Group Therapy  Participation Level:  Active  Participation Quality:  Appropriate and Sharing  Affect:  Appropriate  Cognitive:  Alert  Insight:  Developing/Improving  Engagement in Therapy:  Developing/Improving  Modes of Intervention:  Discussion, Education, Problem-solving, Reality Testing and Support  Summary of Progress/Problems:Feelings around Relapse. Group members discussed the meaning of relapse and shared personal stories of relapse, how it affected them and others, and how they perceived themselves during this time. Group members were encouraged to identify triggers, warning signs and coping skills used when facing the possibility of relapse. Social supports were discussed and explored in detail. Patients also discussed facing disappointment and how that can trigger someone to relapse.   Denise Bramblett G. [place], [place] 04/21/2016, 11:07 AM
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
## 2016-04-21 NOTE — Progress Notes (Signed)
Patient ID: Whitney Bush, female   DOB: [birth year], 46 y.o.   MRN: [id number] Mood and affect better, hopeful, denied SI/HI, denied AV/H, received Trazodone 100 mg at bedtime as scheduled; anticipating discharge in the morning.

## 2016-04-21 NOTE — Progress Notes (Signed)
Recreation Therapy Notes  INPATIENT RECREATION TR PLAN  Patient Details Name: Whitney Bush MRN: [id number] DOB: November 10, 1970 Today's Date: 04/21/2016  Rec Therapy Plan Is patient appropriate for Therapeutic Recreation?: Yes Treatment times per week: At least once a week TR Treatment/Interventions: 1:1 session, Group participation (Comment) (Appropriate participation in daily recreational therapy tx)  Discharge Criteria Pt will be discharged from therapy if:: Treatment goals are met, Discharged Treatment plan/goals/alternatives discussed and agreed upon by:: Patient/family  Discharge Summary Short term goals set: See Care Plan Short term goals met: Complete Progress toward goals comments: One-to-one attended Which groups?: Leisure education One-to-one attended: Self-esteem, stress management Reason goals not met: N/A Therapeutic equipment acquired: None Reason patient discharged from therapy: Discharge from hospital Pt/family agrees with progress & goals achieved: Yes Date patient discharged from therapy: 04/21/16   Leonette Monarch, LRT/CTRS  04/21/2016, 2:28 PM

## 2016-04-21 NOTE — Plan of Care (Signed)
Problem: Activity: Goal: Sleeping patterns will improve Outcome: Progressing Patient slept for Estimated Hours of 7.15; every 15 minutes safety round maintained, no injury or falls during this shift.    
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
## 2016-04-21 NOTE — Progress Notes (Signed)
Recreation Therapy Notes  Date: 02.23.18 Time: 1:00 pm Location: Craft Room  Group Topic: Social Skills  Goal Area(s) Addresses:  Patient will effectively work with peers towards shared goal. Patient will identify skill used to make activity successful. Patient will identify how skills used during activity can be used to reach post d/c. goals.  Behavioral Response: Did not attend  Intervention: Life Boat  Activity: Patients were given a list of 16 people English as a second language teacher, Pharmacist, hospital, doctor, etc.) and were told they had to pick 8 people to be on a bigger, faster boat with them and 8 people who would be on an inflatable boat.  Education: LRT educated patients on healthy support systems.  Education Outcome: Patient did not attend group.  Clinical Observations/Feedback: Patient was in a family meeting during group.  Leonette Monarch, LRT/CTRS 04/21/2016 2:03 PM

## 2016-05-05 ENCOUNTER — Other Ambulatory Visit: Payer: Self-pay

## 2016-05-05 ENCOUNTER — Telehealth: Payer: Self-pay

## 2016-05-05 NOTE — Telephone Encounter (Signed)
I do not know anything about an increased dose.  The last dose we have in her record is 40 mg a day from her hospital stay last month.  We can send in 30 days of this to pharmacy of her choice.

## 2016-05-05 NOTE — Telephone Encounter (Signed)
Whitney Bush she was admitted to [place] a few weeks ago and they increased her lasix but the pharmacy doesn't have it and she can't get her regular rx fiilled again until the 18th for once daily. She is starting to retain fluid again in her legs and feet and states she is needing this called in today. Please advise

## 2016-05-05 NOTE — Telephone Encounter (Signed)
Patient aware.

## 2016-06-05 ENCOUNTER — Telehealth: Payer: Self-pay

## 2016-06-05 NOTE — Telephone Encounter (Signed)
Can we get records from [facility]? There is nothing new in care everywhere since 11/2015.   Last OV, she was doing well. She advised that she would f/u with [facility] and wait to have labs with them. She was not due to come back here until this month.  [facility] had recommended EGD at their 11/2015 OV, I had planned on letting her discuss with them at her OV, but she can have here if more convenient.

## 2016-06-05 NOTE — Telephone Encounter (Signed)
Pt called upset because she is not getting the service that she feels like she should. She stated that she has gotten a Chief Executive Officer. She feels like that [facility] is not lets [place] know her information. She does not know who is going to do the procedure. I was trying to get more information from her and she hung up on me because she said that I was not listening to her or helping her.

## 2016-06-06 NOTE — Telephone Encounter (Signed)
Requested records from [facility]

## 2016-06-06 NOTE — Telephone Encounter (Signed)
Routing to Manuela Schwartz to retrieve records ASAP

## 2016-06-08 ENCOUNTER — Other Ambulatory Visit: Payer: Self-pay | Admitting: Psychiatry

## 2016-06-09 ENCOUNTER — Encounter: Payer: Self-pay | Admitting: Gastroenterology

## 2016-06-09 ENCOUNTER — Ambulatory Visit (INDEPENDENT_AMBULATORY_CARE_PROVIDER_SITE_OTHER): Payer: Medicaid Other | Admitting: Gastroenterology

## 2016-06-09 VITALS — BP 107/70 | HR 81 | Temp 98.2°F | Ht 62.0 in | Wt 228.6 lb

## 2016-06-09 DIAGNOSIS — D509 Iron deficiency anemia, unspecified: Secondary | ICD-10-CM | POA: Diagnosis not present

## 2016-06-09 DIAGNOSIS — K7030 Alcoholic cirrhosis of liver without ascites: Secondary | ICD-10-CM | POA: Diagnosis not present

## 2016-06-09 NOTE — Patient Instructions (Signed)
PA info for [place] abd complete submitted via [employer]. [place] approved. Auth# [id number]. 06/09/16-07/09/16.

## 2016-06-09 NOTE — Telephone Encounter (Signed)
Patient seen in the office today.

## 2016-06-09 NOTE — Patient Instructions (Signed)
1. Please have your ultrasound done as scheduled.  2. I am obtaining copy of records from your kidney doctor, [place] so we can determine what dose on your fluid pills is needed right now.  3. I will contact your daughter Monday morning to discuss the need to look in your esophagus for blood vessels (related to your liver) that can bleed. These are call esophageal varices.

## 2016-06-09 NOTE — Assessment & Plan Note (Addendum)
Somewhat improved. EGD and colonoscopy as outlined. Colonoscopy with inadequate bowel prep. Patient continues to menstruate, sometimes heavy cycles. IDA may be multifactorial. Plan on upper endoscopy in the near future for h/o esophageal varices per [facility] request. Based on findings may need updating colonoscopy sooner than later. Patient will like me to talk with her daughter, Madaline Guthrie about her care, need for upper endoscopy. Will discuss with her via phone Monday.  I have discussed the risks, alternatives, benefits with regards to but not limited to the risk of reaction to medication, bleeding, infection, perforation and the patient is agreeable to proceed. Written consent to be obtained.

## 2016-06-09 NOTE — Progress Notes (Addendum)
Primary Care Physician: Raylene Everts, MD  Primary Gastroenterologist:  Garfield Cornea, MD   Chief Complaint  Patient presents with  . Cirrhosis    f/u, hard to breathe lying down, not voiding much, edema LE    HPI: Whitney Bush is a 46 y.o. female here for follow up. She was last seen in 02/2016. She has h/o ETOH related cirrhosis. She was seen by [facility] liver clinic 05/18/16. She is due for hepatoma screening, EGD at this time and prefers to have done locally due to transportation issues. She needs to complete 6 months of counseling prior to evaluation for potential liver transplant if her liver function were to decline. Currently not in need of a liver. She recently had a relapse with etoh use. She states she drank after a death in the family. She had been sober for over one year. She was brought to hospital by the police to be involuntarily committed for voicing suicidal ideation. Her etoh level was 203.   Labs at [facility] on 05/18/2016, alcohol panel negative, hemoglobin 10.8, hematocrit 34.8, MCV 79.6, platelets 182,000, white blood cell count 10,400, BUN 26, creatinine 1.6, total bilirubin 0.9, alkaline phosphatase 106, AST 34, ALT 21, albumin 4.1, hepatitis C antibody negative, ferritin 12.7, iron 38, TIBC 458, iron saturations 8%, INR 1.31.  MELD 14 as of 05/18/16.  November 2017 weight of 204. 02/07/2016 weight of 219. 04/20/2016 weight of 220. Today 228.6 pounds.  Patient states she is struggling with understanding her medical issues. She wishes her daughter can be present during office visits but she has to work and goes to school. She is a [employer]ar[employer] manager. She reports being confused about what she needs to have done. Advised to have upper endoscopy by [facility] doesn't know where to have it done. She has difficulty with transportation. Advised her we can do it here locally. She is also due for hepatoma screening. She is worried about her weight gain. She has trace to 1+ pedal edema, no obvious  ascites. She has had weight gain but no evidence of significant third spacing on exam today. She continues diuretic regimen, someone increased it since we last saw her. She states she is voiding about 3 times daily. Her urine is yellow. She does consume canned foods/vegetables. Trying to lose weight, consuming Slim fast twice daily for total of about 400 mg of sodium. Avoids lunch and meats, hotdogs, frozen dinners.  Denies abdominal pain, constipation, diarrhea, melena, rectal bleeding. No heartburn. No jaundice. Diverticulitis. Continues to have heavy menstrual cycles.  Last colonoscopy May 2017 with inadequate bowel prep. She had a single tubular adenoma removed. Given inadequate bowel prep, persistent microcytic anemia she may need to have updated colonoscopy sooner than originally planned. Last EGD December 2016 she had grade 2 esophageal varices, moderate portal hypertensive gastropathy, gastritis.  Patient tells me that she currently does not want to follow-up at [facility]. She is not sure that she would ever want a liver transplant. She feels overwhelmed with her medical issues. She has an appointment next week at [facility]. She did not like the counselor a day mark. Denies alcohol use since recent hospitalization. Denies homicidal/suicidal ideation.     Current Outpatient Prescriptions  Medication Sig Dispense Refill  . B Complex Vitamins (VITAMIN B-COMPLEX PO) Take 1 tablet by mouth daily.    Marland Kitchen FLUoxetine (PROZAC) 20 MG tablet Take 1 tablet (20 mg total) by mouth daily. 30 tablet 3  . furosemide (LASIX) 40 MG  tablet Take 1 tablet (40 mg total) by mouth daily. 30 tablet 11  . gabapentin (NEURONTIN) 100 MG capsule Take 1 capsule (100 mg total) by mouth 2 (two) times daily. 60 capsule 1  . lactulose (CHRONULAC) 10 GM/15ML solution Take 15 mLs (10 g total) by mouth 2 (two) times daily. 946 mL 3  . pantoprazole (PROTONIX) 40 MG tablet Take 1 tablet (40 mg total) by mouth daily. 30 tablet 2  .  prazosin (MINIPRESS) 1 MG capsule Take 1 capsule (1 mg total) by mouth 2 (two) times daily. 60 capsule 1  . propranolol (INDERAL) 10 MG tablet Take 1 tablet (10 mg total) by mouth 3 (three) times daily. 90 tablet 1  . spironolactone (ALDACTONE) 100 MG tablet Take 1 tablet (100 mg total) by mouth 2 (two) times daily. 60 tablet 1  . traZODone (DESYREL) 100 MG tablet Take 1 tablet (100 mg total) by mouth at bedtime. 30 tablet 1  . vitamin E 400 UNIT capsule Take 400 Units by mouth daily.     No current facility-administered medications for this visit.     Allergies as of 06/09/2016  . (No Known Allergies)   Past Medical History:  Diagnosis Date  . Alcohol abuse   . 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 Anemia   . Blood transfusion without reported diagnosis   . Boil 06/04/2015  . Chronic kidney disease   . Depression   . GERD (gastroesophageal reflux disease)   . GI (gastrointestinal bleed) 07/28/2015  . Heart murmur   . Hyperlipidemia   . Hypertension   . Migraines   . Peripheral edema   . Tubular adenoma    Past Surgical History:  Procedure Laterality Date  . ABCESS DRAINAGE     x5; neck, arm, chest, back  . BIOPSY  02/24/2015   Procedure: BIOPSY;  Surgeon: Danie Binder, MD;  Location: [facility];  Service: Endoscopy;;  gastric bx's  . CESAREAN SECTION    . COLONOSCOPY WITH PROPOFOL N/A 06/28/2015   Dr.Rourk- inadequate prep- One 6 mm polyp at the splenic flexure bx= tubular adenoma  . ESOPHAGOGASTRODUODENOSCOPY (EGD) WITH PROPOFOL N/A 02/24/2015   SLF: Grade II esophageal varices Moderate portal hypertensive gastropathy Mild erosive gastritis anemia likely due to many factors: gastritis, gastropathy, coagulopathy, chronic disease  . POLYPECTOMY  06/28/2015   Procedure: POLYPECTOMY;  Surgeon: Daneil Dolin, MD;  Location: [facility];  Service: Endoscopy;;  at splenic flexure   Family History  Problem Relation Age of Onset  . Diabetes Father    . Hyperlipidemia Father   . Alcohol abuse Father   . Cancer Other   . Cervical cancer Maternal Grandmother   . Lung cancer Maternal Grandfather   . Alcohol abuse Other     multiple family members  . Diabetes Sister   . Hypertension Brother   . Colon cancer Neg Hx   . Liver disease Neg Hx    Social History   Social History  . Marital status: Widowed    Spouse name: N/A  . Number of children: 1  . Years of education: 42   Occupational History  . unemployed    Social History Main Topics  . Smoking status: Former Smoker    Packs/day: 1.00    Years: 5.00    Types: Cigarettes    Quit date: 12/29/2010  . Smokeless tobacco: Former Systems developer    Types: Snuff  .  Alcohol use No     Comment: 06/09/16 "some alcohol 3 mnths ago"  . Drug use: No  . Sexual activity: Not Currently    Birth control/ protection: None   Other Topics Concern  . None   Social History Narrative   Lives with parents    ROS:  General: Negative for anorexia, weight loss, fever, chills, fatigue, weakness. ENT: Negative for hoarseness, difficulty swallowing , nasal congestion. CV: Negative for chest pain, angina, palpitations, dyspnea on exertion, peripheral edema.  Respiratory: Negative for dyspnea at rest, dyspnea on exertion, cough, sputum, wheezing.  GI: See history of present illness. GU:  Negative for dysuria, hematuria, urinary incontinence, urinary frequency, nocturnal urination.  Endo: Negative for unusual weight change.    Physical Examination:   BP 107/70   Pulse 81   Temp 98.2 F (36.8 C) (Oral)   Ht 5\' 2"  (1.575 m)   Wt 228 lb 9.6 oz (103.7 kg)   LMP 05/12/2016   BMI 41.81 kg/m   General: Well-nourished, well-developed in no acute distress. Tearful Eyes: No icterus. Mouth: Oropharyngeal mucosa moist and pink , no lesions erythema or exudate. Lungs: Clear to auscultation bilaterally.  Heart: Regular rate and rhythm, no murmurs rubs or gallops.  Abdomen: Bowel sounds are normal,  nontender, nondistended, no hepatosplenomegaly or masses, no abdominal bruits or hernia , no rebound or guarding.   Extremities: Trace to 1+ edema to the shins bilaterally. No clubbing or deformities. Neuro: Alert and oriented x 4   Skin: Warm and dry, no jaundice.   Psych: Alert and cooperative, normal mood and affect.  Labs:  See history of present illness Imaging Studies: No results found.

## 2016-06-09 NOTE — Assessment & Plan Note (Addendum)
Due for hepatoma surveillance and [facility] liver clinic wants updated EGD. Patient prefers to have locally. Current meld score 12 last month. She had relapsed with alcohol use as outlined above. Encouraged counseling, she has an appointment next week. Patient is unsure whether or not she wants to pursue liver transplant at any point in the future but does not want to follow-up with [facility] at this time. Advised patient that if she changes her mind we can get another appointment in the future. She's also been advised that if she were to need a liver at some point in the future and desires so, she would need to have had completed 6 months of counseling regarding alcohol abuse per their protocol.  Labs are currently up-to-date. Abdominal ultrasound in the near future. Patient requests to talk to her daughter regarding the EGD, will talk her Monday via phone.  Will also obtain med list from pharmacy to ensure accuracy and obtain copy of records from nephrology.

## 2016-06-12 ENCOUNTER — Telehealth: Payer: Self-pay

## 2016-06-12 NOTE — Telephone Encounter (Signed)
Noted  

## 2016-06-12 NOTE — Progress Notes (Signed)
Received copy of medication list from [facility]. Updated in this ov note.

## 2016-06-12 NOTE — Telephone Encounter (Signed)
Medication list received from the pharmacy and updated. List on desk for Neil Crouch, PA.

## 2016-06-12 NOTE — Progress Notes (Signed)
CC'ED TO PCP 

## 2016-06-12 NOTE — Progress Notes (Signed)
Tried to call daughter, Darnisha Vernet. LMOAM for return call.

## 2016-06-13 ENCOUNTER — Telehealth: Payer: Self-pay | Admitting: Internal Medicine

## 2016-06-13 NOTE — Telephone Encounter (Signed)
Pt's daughter, Marzella Miracle, called to say that she was returning [facility] call from yesterday. She said it was to discuss patient's care plan. Please call her back at [phone]

## 2016-06-13 NOTE — Telephone Encounter (Signed)
Routing to LSL 

## 2016-06-13 NOTE — Telephone Encounter (Signed)
Spoke with patient's daughter. Patient has a lot of anxiety the further away from home she gets and that makes her not want to pursue any follow up or tests at [facility].  Whitney Bush will talk with mom (patient) about the EGD here and let us know if she is agreeable and schedule accordingly.   We need EGD with possible variceal banding with rmr in or when they decide. Dx:

## 2016-06-14 ENCOUNTER — Ambulatory Visit (HOSPITAL_COMMUNITY)
Admission: RE | Admit: 2016-06-14 | Discharge: 2016-06-14 | Disposition: A | Discharge status: Home / Self Care | Payer: Medicaid Other | Source: Ambulatory Visit | Attending: Gastroenterology | Admitting: Gastroenterology

## 2016-06-14 DIAGNOSIS — K769 Liver disease, unspecified: Secondary | ICD-10-CM | POA: Insufficient documentation

## 2016-06-14 DIAGNOSIS — K8020 Calculus of gallbladder without cholecystitis without obstruction: Secondary | ICD-10-CM | POA: Diagnosis not present

## 2016-06-14 DIAGNOSIS — K7030 Alcoholic cirrhosis of liver without ascites: Secondary | ICD-10-CM

## 2016-06-19 NOTE — Progress Notes (Signed)
No hepatoma. Known gallstones.  Repeat ruq u/s in six months.  egd when she is ready. See previous telephone note.  Return to the office in 3 months.

## 2016-06-20 ENCOUNTER — Encounter: Payer: Self-pay | Admitting: Internal Medicine

## 2016-06-20 NOTE — Progress Notes (Signed)
APPOINTMENT MADE AND ON RECALL FOR ULTRASOUND

## 2016-06-29 NOTE — Telephone Encounter (Signed)
Pt called office. She received letter in the mail. She is ok with scheduling EGD. Pt will need office visit to update info d/t her last office visit was 06/09/16 and [facility] doesn't have opening for procedure until June.

## 2016-06-30 ENCOUNTER — Telehealth: Payer: Self-pay

## 2016-06-30 NOTE — Telephone Encounter (Signed)
Pt called stating that she wanted to change her pcp to the health dept. She said she never received a physical and so she went to the health dept and had it done and she wants just 1 dr so she is moving there

## 2016-06-30 NOTE — Telephone Encounter (Signed)
Remove dr Meda Coffee as pcp

## 2016-07-05 NOTE — Telephone Encounter (Signed)
She has an appt 07/06/16 with LSL.

## 2016-07-05 NOTE — Telephone Encounter (Signed)
Noted. Make an ov.

## 2016-07-06 ENCOUNTER — Ambulatory Visit: Payer: Medicaid Other | Admitting: Gastroenterology

## 2016-07-07 ENCOUNTER — Other Ambulatory Visit: Payer: Self-pay

## 2016-07-07 ENCOUNTER — Encounter: Payer: Self-pay | Admitting: Gastroenterology

## 2016-07-07 ENCOUNTER — Ambulatory Visit (INDEPENDENT_AMBULATORY_CARE_PROVIDER_SITE_OTHER): Payer: Medicaid Other | Admitting: Gastroenterology

## 2016-07-07 VITALS — BP 112/79 | HR 76 | Temp 97.7°F | Ht 63.0 in | Wt 225.2 lb

## 2016-07-07 DIAGNOSIS — I8500 Esophageal varices without bleeding: Secondary | ICD-10-CM

## 2016-07-07 DIAGNOSIS — R1319 Other dysphagia: Secondary | ICD-10-CM | POA: Insufficient documentation

## 2016-07-07 DIAGNOSIS — D509 Iron deficiency anemia, unspecified: Secondary | ICD-10-CM | POA: Diagnosis not present

## 2016-07-07 DIAGNOSIS — I8510 Secondary esophageal varices without bleeding: Secondary | ICD-10-CM

## 2016-07-07 DIAGNOSIS — K7030 Alcoholic cirrhosis of liver without ascites: Secondary | ICD-10-CM

## 2016-07-07 DIAGNOSIS — R1310 Dysphagia, unspecified: Secondary | ICD-10-CM | POA: Diagnosis not present

## 2016-07-07 DIAGNOSIS — D649 Anemia, unspecified: Secondary | ICD-10-CM

## 2016-07-07 NOTE — Assessment & Plan Note (Signed)
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 She was sober for 1 year but relapsed back in February. States she's been sober again for 2 months. No significant peripheral edema. No prior pack encephalopathy. She has been seen at [facility] liver clinic but prefers not to go back to [facility] due to transportation concerns. She is not sure if she is interested in liver transplant at any point. She presents today for EGD to reevaluate her esophageal varices. Possible banding planned. She also complains of esophageal dysphagia to solid foods and pills. Discussed with patient at length that she may or may not be a candidate for esophageal dilation depending on her varices. Discussed with Dr. Gala Romney. Plan on EGD in near future with deep sedation given prior alcohol abuse.  I have discussed the risks, alternatives, benefits with regards to but not limited to the risk of reaction to medication, bleeding, infection, perforation and the patient is agreeable to proceed. Written consent to be obtained.  Of note, patient has a history of microcytic anemia in the setting of heavy menses. Prior colonoscopy in May 2017, prep was not adequate. Procedure EGD at this time. Based on findings we may need to consider repeat colonoscopy at some point in the not too distant future for further evaluation of microcytic anemia. Further recommendations to follow.  Patient is currently up-to-date on hepatoma surveillance. Her labs are up-to-date as well. She has an appointment already to come back and see me in 3 months.

## 2016-07-07 NOTE — Patient Instructions (Signed)
1. Upper endoscopy scheduled. Please see separate instructions.

## 2016-07-07 NOTE — Progress Notes (Signed)
Primary Care Physician:  Raylene Everts, MD  Primary Gastroenterologist:  Garfield Cornea, MD   Chief Complaint  Patient presents with  . Dysphagia    discomfort left side of throat, coughs at night  . Chest Pain    to discuss egd  . Cirrhosis    LE swelling    HPI:  Whitney Bush is a 46 y.o. female here To schedule EGD for esophageal varices, possible banding. She was last seen in April for follow-up of her cirrhosis. Was not ready to schedule EGD at that time. She has a history of EtOH related cirrhosis. Currently up-to-date on labs and hepatoma screening.  Patient had a relapse in alcohol use back in February. Had been sober for over year prior to that. See details from 06/09/2016 note. Also patient currently declining going to [facility] liver clinic due to "transportation issues ", states she is not interested in liver transplant at this point. MELD 14 on 05/18/2016. Since her relapse few months ago, she did go to [facility] but didn't like it. Appointment at [facility]. Denies alcohol use since hospitalization in February.  She denies abdominal pain. She does complain of fatigue. No melena rectal bleeding. No constipation. No heartburn. Continues to have heavy menstrual cycles. She does complain of dysphagia to pills and meat. She states she sleeps propped up because with her excessive weight gain she feels like she can't breathe when she lays down. She denies exertional chest pain or shortness of breath. No dyspnea on exertion.  Last colonoscopy May 2017, bowel prep was not adequate. Last EGD December 2016, grade 2 esophageal varices, moderate portal hypertensive gastropathy, gastritis. She recommended follow-up EGD.   Current Outpatient Prescriptions  Medication Sig Dispense Refill  . B Complex Vitamins (VITAMIN B-COMPLEX PO) Take 1 tablet by mouth daily.    Marland Kitchen FLUoxetine (PROZAC) 20 MG tablet Take 1 tablet (20 mg total) by mouth daily. 30 tablet 3  . furosemide (LASIX) 40 MG tablet  Take 1 tablet (40 mg total) by mouth daily. 30 tablet 11  . gabapentin (NEURONTIN) 100 MG capsule Take 1 capsule (100 mg total) by mouth 2 (two) times daily. 60 capsule 1  . lactulose (CHRONULAC) 10 GM/15ML solution Take 15 mLs (10 g total) by mouth 2 (two) times daily. 946 mL 3  . pantoprazole (PROTONIX) 40 MG tablet Take 1 tablet (40 mg total) by mouth daily. 30 tablet 2  . prazosin (MINIPRESS) 1 MG capsule Take 1 capsule (1 mg total) by mouth 2 (two) times daily. 60 capsule 1  . propranolol (INDERAL) 10 MG tablet Take 1 tablet (10 mg total) by mouth 3 (three) times daily. 90 tablet 1  . spironolactone (ALDACTONE) 100 MG tablet Take 1 tablet (100 mg total) by mouth 2 (two) times daily. 60 tablet 1  . traZODone (DESYREL) 100 MG tablet Take 1 tablet (100 mg total) by mouth at bedtime. 30 tablet 1  . vitamin E 400 UNIT capsule Take 400 Units by mouth daily.     No current facility-administered medications for this visit.     Allergies as of 07/07/2016  . (No Known Allergies)    Past Medical History:  Diagnosis Date  . Alcohol abuse   . 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 Anemia   . Blood transfusion without reported diagnosis   . Boil 06/04/2015  . Chronic kidney disease   . Depression   . GERD (gastroesophageal reflux  disease)   . GI (gastrointestinal bleed) 07/28/2015  . Heart murmur   . Hyperlipidemia   . Hypertension   . Migraines   . Peripheral edema   . Tubular adenoma     Past Surgical History:  Procedure Laterality Date  . ABCESS DRAINAGE     x5; neck, arm, chest, back  . BIOPSY  02/24/2015   Procedure: BIOPSY;  Surgeon: Danie Binder, MD;  Location: [facility];  Service: Endoscopy;;  gastric bx's  . CESAREAN SECTION    . COLONOSCOPY WITH PROPOFOL N/A 06/28/2015   Dr.Rourk- inadequate prep- One 6 mm polyp at the splenic flexure bx= tubular adenoma  . ESOPHAGOGASTRODUODENOSCOPY (EGD) WITH PROPOFOL N/A 02/24/2015   SLF: Grade II  esophageal varices Moderate portal hypertensive gastropathy Mild erosive gastritis anemia likely due to many factors: gastritis, gastropathy, coagulopathy, chronic disease  . POLYPECTOMY  06/28/2015   Procedure: POLYPECTOMY;  Surgeon: Daneil Dolin, MD;  Location: [facility];  Service: Endoscopy;;  at splenic flexure    Family History  Problem Relation Age of Onset  . Diabetes Father   . Hyperlipidemia Father   . Alcohol abuse Father   . Cancer Other   . Cervical cancer Maternal Grandmother   . Lung cancer Maternal Grandfather   . Alcohol abuse Other        multiple family members  . Diabetes Sister   . Hypertension Brother   . Colon cancer Neg Hx   . Liver disease Neg Hx     Social History   Social History  . Marital status: Widowed    Spouse name: N/A  . Number of children: 1  . Years of education: 77   Occupational History  . unemployed    Social History Main Topics  . Smoking status: Former Smoker    Packs/day: 1.00    Years: 5.00    Types: Cigarettes    Quit date: 12/29/2010  . Smokeless tobacco: Former Systems developer    Types: Snuff  . Alcohol use No     Comment: 06/09/16 "some alcohol 3 mnths ago"; denied 07/07/16  . Drug use: No  . Sexual activity: Not Currently    Birth control/ protection: None   Other Topics Concern  . Not on file   Social History Narrative   Lives with parents      ROS:  General: Negative for anorexia, weight loss, fever, chills. Complains of fatigue Eyes: Negative for vision changes.  ENT: Negative for hoarseness, nasal congestion. See history of present illness CV: Negative for chest pain, angina, palpitations, dyspnea on exertion, peripheral edema.  Respiratory: Negative for dyspnea at rest, dyspnea on exertion, cough, sputum, wheezing. See history of present illness GI: See history of present illness. GU:  Negative for dysuria, hematuria, urinary incontinence, urinary frequency, nocturnal urination.  MS: Negative for joint pain, low  back pain.  Derm: Negative for rash or itching.  Neuro: Negative for weakness, abnormal sensation, seizure, frequent headaches, memory loss, confusion.  Psych: Negative for anxiety, depression, suicidal ideation, hallucinations.  Endo: Negative for unusual weight change.  Heme: Negative for bruising or bleeding. Allergy: Negative for rash or hives.    Physical Examination:  BP 112/79   Pulse 76   Temp 97.7 F (36.5 C) (Oral)   Ht 5\' 3"  (1.6 m)   Wt 225 lb 3.2 oz (102.2 kg)   LMP 06/27/2016 (Approximate)   BMI 39.89 kg/m    General: Well-nourished, well-developed in no acute distress.  Head: Normocephalic,  atraumatic.   Eyes: Conjunctiva pink, no icterus. Mouth: Oropharyngeal mucosa moist and pink , no lesions erythema or exudate. Neck: Supple without thyromegaly, masses, or lymphadenopathy.  Lungs: Clear to auscultation bilaterally.  Heart: Regular rate and rhythm, no murmurs rubs or gallops.  Abdomen: Bowel sounds are normal, nontender, nondistended, no hepatosplenomegaly or masses, no abdominal bruits or    hernia , no rebound or guarding.   Rectal: Not performed Extremities: No lower extremity edema. No clubbing or deformities.  Neuro: Alert and oriented x 4 , grossly normal neurologically.  Skin: Warm and dry, no rash or jaundice.   Psych: Alert and cooperative, normal mood and affect.  Labs: Lab Results  Component Value Date   CREATININE 1.42 (H) 04/19/2016   BUN 18 04/19/2016   NA 134 (L) 04/19/2016   K 3.9 04/19/2016   CL 102 04/19/2016   CO2 25 04/19/2016   Lab Results  Component Value Date   ALT 19 04/19/2016   AST 35 04/19/2016   ALKPHOS 80 04/19/2016   BILITOT 0.4 04/19/2016   Lab Results  Component Value Date   WBC 12.0 (H) 04/19/2016   HGB 10.3 (L) 04/19/2016   HCT 33.1 (L) 04/19/2016   MCV 78.3 (L) 04/19/2016   PLT 182 04/19/2016     Imaging Studies: US Abdomen Complete  Result Date: 06/14/2016 CLINICAL DATA:  Follow-up cirrhosis EXAM:  ABDOMEN ULTRASOUND COMPLETE COMPARISON:  12/10/2014 FINDINGS: Gallbladder: Well distended with a few dependent gallstones. No wall thickening or pericholecystic fluid is noted. Mild gallbladder sludge is noted as well. Common bile duct: Diameter: 3 mm. Liver: Slightly increased in echogenicity with mild nodularity consistent with the given clinical history. Small cysts are seen measuring less than 1 cm. These are stable from prior CT examination. IVC: No abnormality visualized. Pancreas: Visualized portion unremarkable. Spleen: Size and appearance within normal limits. Right Kidney: Length: 9.9 cm. Echogenicity within normal limits. No mass or hydronephrosis visualized. Left Kidney: Length: 10.2 cm. Echogenicity within normal limits. No mass or hydronephrosis visualized. Abdominal aorta: No aneurysm visualized. Other findings: None. IMPRESSION: Changes consistent with cirrhosis. Some cystic lesions are noted within the liver stable from previous exams. Cholelithiasis and gallbladder sludge without complicating factors. Electronically Signed   By: Inez Catalina M.D.   On: 06/14/2016 09:01

## 2016-07-10 NOTE — Progress Notes (Signed)
cc'ed to pcp °

## 2016-07-31 ENCOUNTER — Telehealth: Payer: Self-pay

## 2016-07-31 NOTE — Telephone Encounter (Signed)
Pt called and she would like to have [facility] call her she would not gave me any reason just that she wanted to talk to [place].

## 2016-08-01 NOTE — Telephone Encounter (Signed)
Pt is aware and she said that it was nothing major and she would wait until [facility] got back into the office

## 2016-08-01 NOTE — Telephone Encounter (Signed)
Please let patient know that I will not be in the office until Wednesday. If something is urgent, it will need to be addressed by another provider.

## 2016-08-02 NOTE — Telephone Encounter (Signed)
Called patient. Inquiring about Body Sculpting. Directed questions to PCP or body sculpting centers.

## 2016-08-04 NOTE — Patient Instructions (Signed)
Whitney Bush  08/04/2016     @PREFPERIOPPHARMACY @   Your procedure is scheduled on  08/14/2016   Report to [facility] at  1130  A.M.  Call this number if you have problems the morning of surgery:  [phone]   Remember:  Do not eat food or drink liquids after midnight.  Take these medicines the morning of surgery with A SIP OF WATER  Prozac, neurontin, protonix, minipress, propranolol.   Do not wear jewelry, make-up or nail polish.  Do not wear lotions, powders, or perfumes, or deoderant.  Do not shave 48 hours prior to surgery.  Men may shave face and neck.  Do not bring valuables to the hospital.  [facility] is not responsible for any belongings or valuables.  Contacts, dentures or bridgework may not be worn into surgery.  Leave your suitcase in the car.  After surgery it may be brought to your room.  For patients admitted to the hospital, discharge time will be determined by your treatment team.  Patients discharged the day of surgery will not be allowed to drive home.   Name and phone number of your driver:   family Special instructions:  Follow the diet instructions given to you by Dr Roseanne Kaufman office.  Please read over the following fact sheets that you were given. Anesthesia Post-op Instructions and Care and Recovery After Surgery       Esophagogastroduodenoscopy Esophagogastroduodenoscopy (EGD) is a procedure to examine the lining of the esophagus, stomach, and first part of the small intestine (duodenum). This procedure is done to check for problems such as inflammation, bleeding, ulcers, or growths. During this procedure, a long, flexible, lighted tube with a camera attached (endoscope) is inserted down the throat. Tell a health care provider about:  Any allergies you have.  All medicines you are taking, including vitamins, herbs, eye drops, creams, and over-the-counter medicines.  Any problems you or family members have had with  anesthetic medicines.  Any blood disorders you have.  Any surgeries you have had.  Any medical conditions you have.  Whether you are pregnant or may be pregnant. What are the risks? Generally, this is a safe procedure. However, problems may occur, including:  Infection.  Bleeding.  A tear (perforation) in the esophagus, stomach, or duodenum.  Trouble breathing.  Excessive sweating.  Spasms of the larynx.  A slowed heartbeat.  Low blood pressure.  What happens before the procedure?  Follow instructions from your health care provider about eating or drinking restrictions.  Ask your health care provider about: ? Changing or stopping your regular medicines. This is especially important if you are taking diabetes medicines or blood thinners. ? Taking medicines such as aspirin and ibuprofen. These medicines can thin your blood. Do not take these medicines before your procedure if your health care provider instructs you not to.  Plan to have someone take you home after the procedure.  If you wear dentures, be ready to remove them before the procedure. What happens during the procedure?  To reduce your risk of infection, your health care team will wash or sanitize their hands.  An IV tube will be put in a vein in your hand or arm. You will get medicines and fluids through this tube.  You will be given one or more of the following: ? A medicine to help you relax (sedative). ? A medicine to numb the area (local anesthetic). This medicine  may be sprayed into your throat. It will make you feel more comfortable and keep you from gagging or coughing during the procedure. ? A medicine for pain.  A mouth guard may be placed in your mouth to protect your teeth and to keep you from biting on the endoscope.  You will be asked to lie on your left side.  The endoscope will be lowered down your throat into your esophagus, stomach, and duodenum.  Air will be put into the endoscope.  This will help your health care provider see better.  The lining of your esophagus, stomach, and duodenum will be examined.  Your health care provider may: ? Take a tissue sample so it can be looked at in a lab (biopsy). ? Remove growths. ? Remove objects (foreign bodies) that are stuck. ? Treat any bleeding with medicines or other devices that stop tissue from bleeding. ? Widen (dilate) or stretch narrowed areas of your esophagus and stomach.  The endoscope will be taken out. The procedure may vary among health care providers and hospitals. What happens after the procedure?  Your blood pressure, heart rate, breathing rate, and blood oxygen level will be monitored often until the medicines you were given have worn off.  Do not eat or drink anything until the numbing medicine has worn off and your gag reflex has returned. This information is not intended to replace advice given to you by your health care provider. Make sure you discuss any questions you have with your health care provider. Document Released: 06/16/2004 Document Revised: 07/22/2015 Document Reviewed: 01/07/2015 Elsevier Interactive Patient Education  2018 [employer]. Esophagogastroduodenoscopy, Care After Refer to this sheet in the next few weeks. These instructions provide you with information about caring for yourself after your procedure. Your health care provider may also give you more specific instructions. Your treatment has been planned according to current medical practices, but problems sometimes occur. Call your health care provider if you have any problems or questions after your procedure. What can I expect after the procedure? After the procedure, it is common to have:  A sore throat.  Nausea.  Bloating.  Dizziness.  Fatigue.  Follow these instructions at home:  Do not eat or drink anything until the numbing medicine (local anesthetic) has worn off and your gag reflex has returned. You will know  that the local anesthetic has worn off when you can swallow comfortably.  Do not drive for 24 hours if you received a medicine to help you relax (sedative).  If your health care provider took a tissue sample for testing during the procedure, make sure to get your test results. This is your responsibility. Ask your health care provider or the department performing the test when your results will be ready.  Keep all follow-up visits as told by your health care provider. This is important. Contact a health care provider if:  You cannot stop coughing.  You are not urinating.  You are urinating less than usual. Get help right away if:  You have trouble swallowing.  You cannot eat or drink.  You have throat or chest pain that gets worse.  You are dizzy or light-headed.  You faint.  You have nausea or vomiting.  You have chills.  You have a fever.  You have severe abdominal pain.  You have black, tarry, or bloody stools. This information is not intended to replace advice given to you by your health care provider. Make sure you discuss any questions you have  with your health care provider. Document Released: 01/31/2012 Document Revised: 07/22/2015 Document Reviewed: 01/07/2015 Elsevier Interactive Patient Education  2018 [employer].  Esophageal Dilatation Esophageal dilatation is a procedure to open a blocked or narrowed part of the esophagus. The esophagus is the long tube in your throat that carries food and liquid from your mouth to your stomach. The procedure is also called esophageal dilation. You may need this procedure if you have a buildup of scar tissue in your esophagus that makes it difficult, painful, or even impossible to swallow. This can be caused by gastroesophageal reflux disease (GERD). In rare cases, people need this procedure because they have cancer of the esophagus or a problem with the way food moves through the esophagus. Sometimes you may need to have  another dilatation to enlarge the opening of the esophagus gradually. Tell a health care provider about:  Any allergies you have.  All medicines you are taking, including vitamins, herbs, eye drops, creams, and over-the-counter medicines.  Any problems you or family members have had with anesthetic medicines.  Any blood disorders you have.  Any surgeries you have had.  Any medical conditions you have.  Any antibiotic medicines you are required to take before dental procedures. What are the risks? Generally, this is a safe procedure. However, problems can occur and include:  Bleeding from a tear in the lining of the esophagus.  A hole (perforation) in the esophagus.  What happens before the procedure?  Do not eat or drink anything after midnight on the night before the procedure or as directed by your health care provider.  Ask your health care provider about changing or stopping your regular medicines. This is especially important if you are taking diabetes medicines or blood thinners.  Plan to have someone take you home after the procedure. What happens during the procedure?  You will be given a medicine that makes you relaxed and sleepy (sedative).  A medicine may be sprayed or gargled to numb the back of the throat.  Your health care provider can use various instruments to do an esophageal dilatation. During the procedure, the instrument used will be placed in your mouth and passed down into your esophagus. Options include: ? Simple dilators. This instrument is carefully placed in the esophagus to stretch it. ? Guided wire bougies. In this method, a flexible tube (endoscope) is used to insert a wire into the esophagus. The dilator is passed over this wire to enlarge the esophagus. Then the wire is removed. ? Balloon dilators. An endoscope with a small balloon at the end is passed down into the esophagus. Inflating the balloon gently stretches the esophagus and opens it  up. What happens after the procedure?  Your blood pressure, heart rate, breathing rate, and blood oxygen level will be monitored often until the medicines you were given have worn off.  Your throat may feel slightly sore and will probably still feel numb. This will improve slowly over time.  You will not be allowed to eat or drink until the throat numbness has resolved.  If this is a same-day procedure, you may be allowed to go home once you have been able to drink, urinate, and sit on the edge of the bed without nausea or dizziness.  If this is a same-day procedure, you should have a friend or family member with you for the next 24 hours after the procedure. This information is not intended to replace advice given to you by your health care provider.  Make sure you discuss any questions you have with your health care provider. Document Released: 04/06/2005 Document Revised: 07/22/2015 Document Reviewed: 06/25/2013 Elsevier Interactive Patient Education  2017 [place] Anesthesia is a term that refers to techniques, procedures, and medicines that help a person stay safe and comfortable during a medical procedure. Monitored anesthesia care, or sedation, is one type of anesthesia. Your anesthesia specialist may recommend sedation if you will be having a procedure that does not require you to be unconscious, such as:  Cataract surgery.  A dental procedure.  A biopsy.  A colonoscopy.  During the procedure, you may receive a medicine to help you relax (sedative). There are three levels of sedation:  Mild sedation. At this level, you may feel awake and relaxed. You will be able to follow directions.  Moderate sedation. At this level, you will be sleepy. You may not remember the procedure.  Deep sedation. At this level, you will be asleep. You will not remember the procedure.  The more medicine you are given, the deeper your level of sedation will be.  Depending on how you respond to the procedure, the anesthesia specialist may change your level of sedation or the type of anesthesia to fit your needs. An anesthesia specialist will monitor you closely during the procedure. Let your health care provider know about:  Any allergies you have.  All medicines you are taking, including vitamins, herbs, eye drops, creams, and over-the-counter medicines.  Any use of steroids (by mouth or as a cream).  Any problems you or family members have had with sedatives and anesthetic medicines.  Any blood disorders you have.  Any surgeries you have had.  Any medical conditions you have, such as sleep apnea.  Whether you are pregnant or may be pregnant.  Any use of cigarettes, alcohol, or street drugs. What are the risks? Generally, this is a safe procedure. However, problems may occur, including:  Getting too much medicine (oversedation).  Nausea.  Allergic reaction to medicines.  Trouble breathing. If this happens, a breathing tube may be used to help with breathing. It will be removed when you are awake and breathing on your own.  Heart trouble.  Lung trouble.  Before the procedure Staying hydrated Follow instructions from your health care provider about hydration, which may include:  Up to 2 hours before the procedure - you may continue to drink clear liquids, such as water, clear fruit juice, black coffee, and plain tea.  Eating and drinking restrictions Follow instructions from your health care provider about eating and drinking, which may include:  8 hours before the procedure - stop eating heavy meals or foods such as meat, fried foods, or fatty foods.  6 hours before the procedure - stop eating light meals or foods, such as toast or cereal.  6 hours before the procedure - stop drinking milk or drinks that contain milk.  2 hours before the procedure - stop drinking clear liquids.  Medicines Ask your health care provider  about:  Changing or stopping your regular medicines. This is especially important if you are taking diabetes medicines or blood thinners.  Taking medicines such as aspirin and ibuprofen. These medicines can thin your blood. Do not take these medicines before your procedure if your health care provider instructs you not to.  Tests and exams  You will have a physical exam.  You may have blood tests done to show: ? How well your kidneys and liver are working. ?  How well your blood can clot.  General instructions  Plan to have someone take you home from the hospital or clinic.  If you will be going home right after the procedure, plan to have someone with you for 24 hours.  What happens during the procedure?  Your blood pressure, heart rate, breathing, level of pain and overall condition will be monitored.  An IV tube will be inserted into one of your veins.  Your anesthesia specialist will give you medicines as needed to keep you comfortable during the procedure. This may mean changing the level of sedation.  The procedure will be performed. After the procedure  Your blood pressure, heart rate, breathing rate, and blood oxygen level will be monitored until the medicines you were given have worn off.  Do not drive for 24 hours if you received a sedative.  You may: ? Feel sleepy, clumsy, or nauseous. ? Feel forgetful about what happened after the procedure. ? Have a sore throat if you had a breathing tube during the procedure. ? Vomit. This information is not intended to replace advice given to you by your health care provider. Make sure you discuss any questions you have with your health care provider. Document Released: 11/09/2004 Document Revised: 07/23/2015 Document Reviewed: 06/06/2015 Elsevier Interactive Patient Education  2018 [place], Care After These instructions provide you with information about caring for yourself after your  procedure. Your health care provider may also give you more specific instructions. Your treatment has been planned according to current medical practices, but problems sometimes occur. Call your health care provider if you have any problems or questions after your procedure. What can I expect after the procedure? After your procedure, it is common to:  Feel sleepy for several hours.  Feel clumsy and have poor balance for several hours.  Feel forgetful about what happened after the procedure.  Have poor judgment for several hours.  Feel nauseous or vomit.  Have a sore throat if you had a breathing tube during the procedure.  Follow these instructions at home: For at least 24 hours after the procedure:   Do not: ? Participate in activities in which you could fall or become injured. ? Drive. ? Use heavy machinery. ? Drink alcohol. ? Take sleeping pills or medicines that cause drowsiness. ? Make important decisions or sign legal documents. ? Take care of children on your own.  Rest. Eating and drinking  Follow the diet that is recommended by your health care provider.  If you vomit, drink water, juice, or soup when you can drink without vomiting.  Make sure you have little or no nausea before eating solid foods. General instructions  Have a responsible adult stay with you until you are awake and alert.  Take over-the-counter and prescription medicines only as told by your health care provider.  If you smoke, do not smoke without supervision.  Keep all follow-up visits as told by your health care provider. This is important. Contact a health care provider if:  You keep feeling nauseous or you keep vomiting.  You feel light-headed.  You develop a rash.  You have a fever. Get help right away if:  You have trouble breathing. This information is not intended to replace advice given to you by your health care provider. Make sure you discuss any questions you have with  your health care provider. Document Released: 06/06/2015 Document Revised: 10/06/2015 Document Reviewed: 06/06/2015 Elsevier Interactive Patient Education  [employer].

## 2016-08-10 ENCOUNTER — Encounter (HOSPITAL_COMMUNITY)
Admission: RE | Admit: 2016-08-10 | Discharge: 2016-08-10 | Disposition: A | Discharge status: Home / Self Care | Payer: Medicaid Other | Source: Ambulatory Visit | Attending: Internal Medicine | Admitting: Internal Medicine

## 2016-08-10 ENCOUNTER — Encounter (HOSPITAL_COMMUNITY): Payer: Self-pay

## 2016-08-10 DIAGNOSIS — Z01812 Encounter for preprocedural laboratory examination: Secondary | ICD-10-CM | POA: Insufficient documentation

## 2016-08-10 LAB — HCG, SERUM, QUALITATIVE: Preg, Serum: NEGATIVE

## 2016-08-10 LAB — CBC
HCT: 38.2 % (ref 36.0–46.0)
Hemoglobin: 12.2 g/dL (ref 12.0–15.0)
MCH: 26.5 pg (ref 26.0–34.0)
MCHC: 31.9 g/dL (ref 30.0–36.0)
MCV: 82.9 fL (ref 78.0–100.0)
PLATELETS: 134 10*3/uL — AB (ref 150–400)
RBC: 4.61 MIL/uL (ref 3.87–5.11)
RDW: 19.5 % — AB (ref 11.5–15.5)
WBC: 11 10*3/uL — ABNORMAL HIGH (ref 4.0–10.5)

## 2016-08-10 LAB — BASIC METABOLIC PANEL
Anion gap: 9 (ref 5–15)
BUN: 16 mg/dL (ref 6–20)
CO2: 25 mmol/L (ref 22–32)
Calcium: 9.1 mg/dL (ref 8.9–10.3)
Chloride: 102 mmol/L (ref 101–111)
Creatinine, Ser: 1.07 mg/dL — ABNORMAL HIGH (ref 0.44–1.00)
GFR calc Af Amer: >60 mL/min (ref >60)
Glucose, Bld: 88 mg/dL (ref 65–99)
POTASSIUM: 4.4 mmol/L (ref 3.5–5.1)
Sodium: 136 mmol/L (ref 135–145)

## 2016-08-14 ENCOUNTER — Ambulatory Visit (HOSPITAL_COMMUNITY)
Admission: RE | Admit: 2016-08-14 | Discharge: 2016-08-14 | Disposition: A | Discharge status: Home / Self Care | Payer: Medicaid Other | Source: Ambulatory Visit | Attending: Internal Medicine | Admitting: Internal Medicine

## 2016-08-14 ENCOUNTER — Encounter (HOSPITAL_COMMUNITY): Payer: Self-pay

## 2016-08-14 ENCOUNTER — Encounter (HOSPITAL_COMMUNITY)
Admission: RE | Disposition: A | Discharge status: Home / Self Care | Payer: Self-pay | Source: Ambulatory Visit | Attending: Internal Medicine

## 2016-08-14 ENCOUNTER — Encounter (HOSPITAL_COMMUNITY): Payer: Self-pay | Admitting: Anesthesiology

## 2016-08-14 DIAGNOSIS — D649 Anemia, unspecified: Secondary | ICD-10-CM | POA: Insufficient documentation

## 2016-08-14 DIAGNOSIS — Z538 Procedure and treatment not carried out for other reasons: Secondary | ICD-10-CM | POA: Diagnosis not present

## 2016-08-14 DIAGNOSIS — I8500 Esophageal varices without bleeding: Secondary | ICD-10-CM | POA: Diagnosis present

## 2016-08-14 HISTORY — DX: Hypothyroidism, unspecified: E03.9

## 2016-08-14 SURGERY — ESOPHAGOGASTRODUODENOSCOPY (EGD) WITH PROPOFOL
Anesthesia: Monitor Anesthesia Care

## 2016-08-14 MED ORDER — LIDOCAINE HCL (PF) 1 % IJ SOLN
INTRAMUSCULAR | Status: AC
  Filled 2016-08-14: qty 5

## 2016-08-14 MED ORDER — PROPOFOL 10 MG/ML IV BOLUS
INTRAVENOUS | Status: AC
  Filled 2016-08-14: qty 40

## 2016-08-14 NOTE — Progress Notes (Signed)
Patient arrived late for upper endoscopy.   Patient ate sandwich at 11:00am. Procedure cancelled per Dr Gala Romney and Dr Patsey Berthold

## 2016-08-14 NOTE — Anesthesia Preprocedure Evaluation (Deleted)
Anesthesia Evaluation  Patient identified by MRN, date of birth, ID band Patient awake    Reviewed: Allergy & Precautions, NPO status , Patient's Chart, lab work & pertinent test results  Airway Mallampati: III  TM Distance: >3 FB Neck ROM: Full    Dental  (+) Teeth Intact,    Pulmonary shortness of breath and with exertion, pneumonia, resolved, former smoker,    Pulmonary exam normal        Cardiovascular hypertension, Pt. on medications Normal cardiovascular exam     Neuro/Psych  Headaches, PSYCHIATRIC DISORDERS Depression    GI/Hepatic GERD  ,(+) Cirrhosis   ascites  substance abuse  alcohol use, Hepatitis -  Endo/Other  Morbid obesity  Renal/GU Renal InsufficiencyRenal disease     Musculoskeletal   Abdominal (+) + obese,   Peds  Hematology  (+) anemia ,   Anesthesia Other Findings   Reproductive/Obstetrics                             Anesthesia Physical Anesthesia Plan  ASA: III  Anesthesia Plan: MAC   Post-op Pain Management:    Induction: Intravenous  PONV Risk Score and Plan:   Airway Management Planned: Simple Face Mask  Additional Equipment:   Intra-op Plan:   Post-operative Plan:   Informed Consent: I have reviewed the patients History and Physical, chart, labs and discussed the procedure including the risks, benefits and alternatives for the proposed anesthesia with the patient or authorized representative who has indicated his/her understanding and acceptance.     Plan Discussed with:   Anesthesia Plan Comments:         Anesthesia Quick Evaluation

## 2016-08-16 ENCOUNTER — Telehealth: Payer: Self-pay

## 2016-08-16 NOTE — Telephone Encounter (Signed)
Pt is calling to reschedule her EGD with Propofol. Does she need to come back into the office? Please advise

## 2016-08-17 NOTE — Telephone Encounter (Signed)
Yes, she will need an OV. We need to make sure we inform patient's that are calling to reschedule procedures that it will delay their care sometimes up to 2 months due to already schedules.

## 2016-08-17 NOTE — Telephone Encounter (Signed)
Pt is set up for office visit on 10/11/16 @ 2:30

## 2016-10-03 ENCOUNTER — Ambulatory Visit: Payer: Self-pay | Admitting: Gastroenterology

## 2016-10-11 ENCOUNTER — Encounter: Payer: Self-pay | Admitting: Gastroenterology

## 2016-10-11 ENCOUNTER — Telehealth: Payer: Self-pay | Admitting: Gastroenterology

## 2016-10-11 ENCOUNTER — Ambulatory Visit: Payer: Medicaid Other | Admitting: Gastroenterology

## 2016-10-11 NOTE — Telephone Encounter (Signed)
PT WAS A NO SHOW AND LETTER SENT  °

## 2016-11-08 ENCOUNTER — Telehealth: Payer: Self-pay | Admitting: Internal Medicine

## 2016-11-08 NOTE — Telephone Encounter (Signed)
Letter mailed

## 2016-11-08 NOTE — Telephone Encounter (Signed)
Recall for ultrasound 

## 2016-11-15 ENCOUNTER — Other Ambulatory Visit: Payer: Self-pay

## 2016-11-15 DIAGNOSIS — K7460 Unspecified cirrhosis of liver: Secondary | ICD-10-CM

## 2016-11-21 ENCOUNTER — Other Ambulatory Visit: Payer: Self-pay

## 2016-11-21 DIAGNOSIS — K7030 Alcoholic cirrhosis of liver without ascites: Secondary | ICD-10-CM

## 2016-11-23 ENCOUNTER — Encounter: Payer: Self-pay | Admitting: Adult Health

## 2016-11-23 ENCOUNTER — Ambulatory Visit (INDEPENDENT_AMBULATORY_CARE_PROVIDER_SITE_OTHER): Payer: Medicaid Other | Admitting: Adult Health

## 2016-11-23 ENCOUNTER — Emergency Department (HOSPITAL_COMMUNITY)
Admission: EM | Admit: 2016-11-23 | Discharge: 2016-11-23 | Disposition: A | Discharge status: Home / Self Care | Payer: Medicaid Other | Attending: Emergency Medicine | Admitting: Emergency Medicine

## 2016-11-23 ENCOUNTER — Encounter (HOSPITAL_COMMUNITY): Payer: Self-pay | Admitting: Emergency Medicine

## 2016-11-23 ENCOUNTER — Emergency Department (HOSPITAL_COMMUNITY): Payer: Medicaid Other

## 2016-11-23 VITALS — BP 110/70 | HR 105 | Ht 63.5 in | Wt 255.0 lb

## 2016-11-23 DIAGNOSIS — Z79899 Other long term (current) drug therapy: Secondary | ICD-10-CM | POA: Insufficient documentation

## 2016-11-23 DIAGNOSIS — E039 Hypothyroidism, unspecified: Secondary | ICD-10-CM | POA: Insufficient documentation

## 2016-11-23 DIAGNOSIS — N189 Chronic kidney disease, unspecified: Secondary | ICD-10-CM | POA: Insufficient documentation

## 2016-11-23 DIAGNOSIS — D500 Iron deficiency anemia secondary to blood loss (chronic): Secondary | ICD-10-CM | POA: Diagnosis not present

## 2016-11-23 DIAGNOSIS — D259 Leiomyoma of uterus, unspecified: Secondary | ICD-10-CM

## 2016-11-23 DIAGNOSIS — N939 Abnormal uterine and vaginal bleeding, unspecified: Secondary | ICD-10-CM | POA: Diagnosis present

## 2016-11-23 DIAGNOSIS — F32A Depression, unspecified: Secondary | ICD-10-CM

## 2016-11-23 DIAGNOSIS — R9389 Abnormal findings on diagnostic imaging of other specified body structures: Secondary | ICD-10-CM

## 2016-11-23 DIAGNOSIS — D126 Benign neoplasm of colon, unspecified: Secondary | ICD-10-CM | POA: Insufficient documentation

## 2016-11-23 DIAGNOSIS — N920 Excessive and frequent menstruation with regular cycle: Secondary | ICD-10-CM

## 2016-11-23 DIAGNOSIS — Z87891 Personal history of nicotine dependence: Secondary | ICD-10-CM | POA: Insufficient documentation

## 2016-11-23 DIAGNOSIS — R938 Abnormal findings on diagnostic imaging of other specified body structures: Secondary | ICD-10-CM | POA: Diagnosis not present

## 2016-11-23 DIAGNOSIS — F329 Major depressive disorder, single episode, unspecified: Secondary | ICD-10-CM

## 2016-11-23 DIAGNOSIS — I129 Hypertensive chronic kidney disease with stage 1 through stage 4 chronic kidney disease, or unspecified chronic kidney disease: Secondary | ICD-10-CM | POA: Insufficient documentation

## 2016-11-23 LAB — CBC WITH DIFFERENTIAL/PLATELET
BASOS ABS: 0 10*3/uL (ref 0.0–0.1)
Basophils Relative: 0 %
EOS ABS: 0.4 10*3/uL (ref 0.0–0.7)
EOS PCT: 5 %
HCT: 27.1 % — ABNORMAL LOW (ref 36.0–46.0)
Hemoglobin: 8.8 g/dL — ABNORMAL LOW (ref 12.0–15.0)
LYMPHS PCT: 20 %
Lymphs Abs: 1.7 10*3/uL (ref 0.7–4.0)
MCH: 29 pg (ref 26.0–34.0)
MCHC: 32.5 g/dL (ref 30.0–36.0)
MCV: 89.4 fL (ref 78.0–100.0)
MONO ABS: 0.6 10*3/uL (ref 0.1–1.0)
Monocytes Relative: 7 %
Neutro Abs: 5.6 10*3/uL (ref 1.7–7.7)
Neutrophils Relative %: 68 %
PLATELETS: 106 10*3/uL — AB (ref 150–400)
RBC: 3.03 MIL/uL — AB (ref 3.87–5.11)
RDW: 16 % — AB (ref 11.5–15.5)
WBC: 8.3 10*3/uL (ref 4.0–10.5)

## 2016-11-23 LAB — TYPE AND SCREEN
ABO/RH(D): A POS
ANTIBODY SCREEN: NEGATIVE

## 2016-11-23 LAB — COMPREHENSIVE METABOLIC PANEL
ALK PHOS: 62 U/L (ref 38–126)
ALT: 16 U/L (ref 14–54)
AST: 29 U/L (ref 15–41)
Albumin: 2.9 g/dL — ABNORMAL LOW (ref 3.5–5.0)
Anion gap: 7 (ref 5–15)
BILIRUBIN TOTAL: 1.7 mg/dL — AB (ref 0.3–1.2)
BUN: 9 mg/dL (ref 6–20)
CALCIUM: 8.2 mg/dL — AB (ref 8.9–10.3)
CHLORIDE: 102 mmol/L (ref 101–111)
CO2: 27 mmol/L (ref 22–32)
CREATININE: 1.15 mg/dL — AB (ref 0.44–1.00)
GFR, EST NON AFRICAN AMERICAN: 57 mL/min — AB (ref >60)
Glucose, Bld: 123 mg/dL — ABNORMAL HIGH (ref 65–99)
Potassium: 3.2 mmol/L — ABNORMAL LOW (ref 3.5–5.1)
Sodium: 136 mmol/L (ref 135–145)
Total Protein: 6.9 g/dL (ref 6.5–8.1)

## 2016-11-23 LAB — I-STAT BETA HCG BLOOD, ED (MC, WL, AP ONLY)

## 2016-11-23 LAB — PROTIME-INR
INR: 1.22
Prothrombin Time: 15.3 seconds — ABNORMAL HIGH (ref 11.4–15.2)

## 2016-11-23 MED ORDER — MEGESTROL ACETATE 40 MG PO TABS: ORAL_TABLET | ORAL | 0 refills | Status: DC

## 2016-11-23 MED ORDER — FUSION PLUS PO CAPS
ORAL_CAPSULE | ORAL | 1 refills | Status: DC
Start: 2016-11-23 — End: 2016-12-13

## 2016-11-23 MED ORDER — SODIUM CHLORIDE 0.9 % IV BOLUS (SEPSIS)
1000.0000 mL | Freq: Once | INTRAVENOUS | Status: AC
  Administered 2016-11-23: 1000 mL via INTRAVENOUS

## 2016-11-23 NOTE — Patient Instructions (Signed)
Stop OCs Take megace  Eat iron rich foods Return in 1 week for endometrial biopsy Call Dr Meda Coffee for refills

## 2016-11-23 NOTE — ED Provider Notes (Signed)
Please see previous physicians note regarding patient's presenting history and physical, initial ED course, and associated medical decision making.   46 year old female with history of alcoholic cirrhosis who presents with vaginal bleeding x 2 months, worsened over the past 24 hours. Dr. Waverly Ferrari had consulted OB/GYN. Dr. Glo Herring to see in the ED.  Discussed with Dr. Glo Herring. He received verbal radiology report regarding patient's pelvic US. With fibroid uterus and endometrial thickening. He recommended discharge from ED, so that she can go to his office where he will perform endometrial biopsy and have her started on Megace. Discussed with patient. Strict return and follow-up instructions reviewed. She expressed understanding of all discharge instructions and felt comfortable with the plan of care.    Forde Dandy, MD 11/23/16 [phone]

## 2016-11-23 NOTE — ED Triage Notes (Signed)
Pt c/o vaginal bleeding x 2 months and for last couple of days she has been passing a lot of clots.

## 2016-11-23 NOTE — Progress Notes (Signed)
Subjective:     Patient ID: Whitney Bush, female   DOB: 09-22-1970, 46 y.o.   MRN: [id number]  HPI Whitney Bush is a 46 year old black female, in for heavy bleeding and clots, was seen in ER at [facility] this am.She has had heavy bleeding for about 2 months and in last 2-3 days started passing clots, was placed on OCs by health dept in [place]. She is feeling tired now.   Review of Systems Bleeding heavy for 2 months For last 2-3 days increased clots +tired Reviewed past medical,surgical, social and family history. Reviewed medications and allergies.     Objective:   Physical Exam BP 110/70 (BP Location: Left Arm, Patient Position: Sitting, Cuff Size: Large)   Pulse (!) 105   Ht 5' 3.5" (1.613 m)   Wt 255 lb (115.7 kg)   BMI 44.46 kg/m UPT negative. Skin warm and dry.Pelvic: external genitalia is normal in appearance no lesions, vagina: period like blood,urethra has no lesions or masses noted, cervix:smooth and bulbous, uterus: enlarged about 16-18 week size,mildly tender, no masses felt, adnexa: no masses or tenderness noted. Bladder is non tender and no masses felt. GC/CHL obtained.    PHQ 9 score 27, is on meds, but out, denies being suicidal or homicidal and has counseling appt.  HGB 8.8 in ER and US showed enlarged uterus with fibroids and thickened endometrium of 3 cm.And right ovarian cyst. Will stop OCs and give megace to stop bleeding and bring back next week for endometrial biopsy with Dr Glo Herring.Will add iron, and discussed with pt, fibroids and need for endometrial biopsy, to rule out cancer.  Assessment:     1. Menorrhagia with regular cycle   2. Uterine leiomyoma, unspecified location   3. Iron deficiency anemia due to chronic blood loss   4. Thickened endometrium   5. Depression, unspecified depression type       Plan:     GC/CHL sent Stop OCs, and take megace, no sex Meds ordered this encounter  Medications  . DISCONTD: Lenard Forth Triphasic  (TRI-LO-ESTARYLLA PO)    Sig: Take by mouth daily.  . megestrol (MEGACE) 40 MG tablet    Sig: Take 3 x 5 days then 2 x 5 days then 1 daily    Dispense:  45 tablet    Refill:  0    Order Specific Question:   Supervising Provider    Answer:   Elonda Husky, LUTHER H [[id number]]  . Iron-FA-B Cmp-C-Biot-Probiotic (FUSION PLUS) CAPS    Sig: Take 2 daily    Dispense:  60 capsule    Refill:  1    Order Specific Question:   Supervising Provider    Answer:   Tania Ade H [[id number]]     Eat iron rich foods Return next week for endometrial biopsy with Dr Glo Herring Review handout on Fibroids and endometrial biopsy Call Dr Meda Coffee about refills on depression meds

## 2016-11-23 NOTE — Discharge Instructions (Signed)
Please go straight to Dr. Johnnye Sima office, where he will perform biopsy and start you on medications that will help with your vaginal bleeding.

## 2016-11-23 NOTE — ED Provider Notes (Addendum)
[facility] DEPT Provider Note   CSN: [id number] Arrival date & time: 11/23/16  1829     History   Chief Complaint Chief Complaint  Patient presents with  . Vaginal Bleeding    HPI Whitney Bush is a 46 y.o. female.  Patient presents to the emergency department for evaluation of vaginal bleeding. She reports that she has been having daily bleeding for approximately 2 months. She has set up an appointment with an OB/GYN doctor, appointment for later today as a first visit. In the last 24 hours, however, her bleeding has become extremely heavy, passing large clots. She has not had any chest pain, shortness of breath or passing out. She reports cramping pain associated with the bleeding.      Past Medical History:  Diagnosis Date  . Alcohol abuse   . 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 Anemia   . Blood transfusion without reported diagnosis   . Boil 06/04/2015  . Chronic kidney disease   . Depression   . GERD (gastroesophageal reflux disease)   . GI (gastrointestinal bleed) 07/28/2015  . Heart murmur   . Hyperlipidemia   . Hypertension   . Hypothyroidism   . Migraines   . Peripheral edema   . Tubular adenoma     Patient Active Problem List   Diagnosis Date Noted  . Alcoholic cirrhosis of liver ([place]) 07/07/2016  . Esophageal dysphagia 07/07/2016  . Esophageal varices without bleeding ([place]) 07/07/2016  . IDA (iron deficiency anemia) 06/09/2016  . PTSD (post-traumatic stress disorder) 04/20/2016  . OCD (obsessive compulsive disorder) 04/20/2016  . Severe recurrent major depression without psychotic features ([place]) 04/19/2016  . GERD (gastroesophageal reflux disease) 03/09/2016  . Situational depression 02/07/2016  . Bilateral edema of lower extremity   . History of colonic polyps   . Alcoholic cirrhosis of liver with ascites ([place])   . Anasarca 02/16/2015  . Alcoholic hepatitis with ascites   . Ascites   . Bleeding  gastrointestinal   . AKI (acute kidney injury) ([place]) 12/10/2014  . Volume overload 12/10/2014  . Alcoholic cirrhosis ([place])   . Elevated bilirubin   . Nausea with vomiting   . Diarrhea   . Absolute anemia 12/02/2014  . Jaundice 12/02/2014  . Essential hypertension 12/02/2014  . Alcohol use disorder, moderate, dependence ([place]) 12/02/2014  . Abdominal pain 12/02/2014    Past Surgical History:  Procedure Laterality Date  . ABCESS DRAINAGE     x5; neck, arm, chest, back  . BIOPSY  02/24/2015   Procedure: BIOPSY;  Surgeon: Danie Binder, MD;  Location: [facility];  Service: Endoscopy;;  gastric bx's  . CESAREAN SECTION    . COLONOSCOPY WITH PROPOFOL N/A 06/28/2015   Dr.Rourk- inadequate prep- One 6 mm polyp at the splenic flexure bx= tubular adenoma  . ESOPHAGOGASTRODUODENOSCOPY (EGD) WITH PROPOFOL N/A 02/24/2015   SLF: Grade II esophageal varices Moderate portal hypertensive gastropathy Mild erosive gastritis anemia likely due to many factors: gastritis, gastropathy, coagulopathy, chronic disease  . POLYPECTOMY  06/28/2015   Procedure: POLYPECTOMY;  Surgeon: Daneil Dolin, MD;  Location: [facility];  Service: Endoscopy;;  at splenic flexure    OB History    Gravida Para Term Preterm AB Living   2 1 1   1 1    SAB TAB Ectopic Multiple Live Births  Home Medications    Prior to Admission medications   Medication Sig Start Date End Date Taking? Authorizing Provider  acetaminophen (TYLENOL) 325 MG tablet Take 650 mg by mouth every 6 (six) hours as needed for mild pain or moderate pain.   Yes [provider]  Cholecalciferol (VITAMIN D PO) Take 1 tablet by mouth daily.   Yes [provider]  FLUoxetine (PROZAC) 20 MG tablet Take 1 tablet (20 mg total) by mouth daily. 04/20/16  Yes Pucilowska, Jolanta B, MD  furosemide (LASIX) 40 MG tablet Take 1 tablet (40 mg total) by mouth daily. 04/20/16  Yes Pucilowska, Jolanta B, MD  lactulose (CHRONULAC) 10  GM/15ML solution Take 15 mLs (10 g total) by mouth 2 (two) times daily. Patient taking differently: Take 10 g by mouth daily as needed for mild constipation.  04/20/16  Yes Pucilowska, Jolanta B, MD  pantoprazole (PROTONIX) 40 MG tablet Take 1 tablet (40 mg total) by mouth daily. 04/20/16  Yes Pucilowska, Jolanta B, MD  spironolactone (ALDACTONE) 100 MG tablet Take 1 tablet (100 mg total) by mouth 2 (two) times daily. 04/20/16  Yes Pucilowska, Jolanta B, MD  vitamin E 400 UNIT capsule Take 400 Units by mouth daily.   Yes [provider]  gabapentin (NEURONTIN) 100 MG capsule Take 1 capsule (100 mg total) by mouth 2 (two) times daily. Patient not taking: Reported on 08/08/2016 04/20/16   Pucilowska, Herma Ard B, MD  prazosin (MINIPRESS) 1 MG capsule Take 1 capsule (1 mg total) by mouth 2 (two) times daily. Patient not taking: Reported on 08/08/2016 04/21/16   Pucilowska, Wardell Honour, MD  propranolol (INDERAL) 10 MG tablet Take 1 tablet (10 mg total) by mouth 3 (three) times daily. 04/20/16   Pucilowska, Herma Ard B, MD  propranolol (INDERAL) 10 MG tablet Take 10 mg by mouth 3 (three) times daily. 04/20/16   [provider]  traZODone (DESYREL) 100 MG tablet Take 1 tablet (100 mg total) by mouth at bedtime. 04/21/16   Clovis Fredrickson, MD    Family History Family History  Problem Relation Age of Onset  . Diabetes Father   . Hyperlipidemia Father   . Alcohol abuse Father   . Cancer Other   . Cervical cancer Maternal Grandmother   . Lung cancer Maternal Grandfather   . Alcohol abuse Other        multiple family members  . Diabetes Sister   . Hypertension Brother   . Colon cancer Neg Hx   . Liver disease Neg Hx     Social History Social History  Substance Use Topics  . Smoking status: Former Smoker    Packs/day: 1.00    Years: 5.00    Types: Cigarettes    Quit date: 12/29/2010  . Smokeless tobacco: Former Systems developer    Types: Snuff  . Alcohol use No     Comment: 06/09/16 "some  alcohol 3 mnths ago"; denied 07/07/16     Allergies   Patient has no known allergies.   Review of Systems Review of Systems  Genitourinary: Positive for vaginal bleeding.  All other systems reviewed and are negative.    Physical Exam Updated Vital Signs Pulse (!) 108   Temp 98.9 F (37.2 C) (Oral)   Resp 18   Ht 5\' 3"  (1.6 m)   Wt 113.4 kg (250 lb)   SpO2 99%   BMI 44.29 kg/m   Physical Exam  Constitutional: She is oriented to person, place, and time. She appears well-developed and  well-nourished. No distress.  HENT:  Head: Normocephalic and atraumatic.  Right Ear: Hearing normal.  Left Ear: Hearing normal.  Nose: Nose normal.  Mouth/Throat: Oropharynx is clear and moist and mucous membranes are normal.  Eyes: Pupils are equal, round, and reactive to light. Conjunctivae and EOM are normal.  Neck: Normal range of motion. Neck supple.  Cardiovascular: Regular rhythm, S1 normal and S2 normal.  Exam reveals no gallop and no friction rub.   No murmur heard. Pulmonary/Chest: Effort normal and breath sounds normal. No respiratory distress. She exhibits no tenderness.  Abdominal: Soft. Normal appearance and bowel sounds are normal. There is no hepatosplenomegaly. There is no tenderness. There is no rebound, no guarding, no tenderness at McBurney's point and negative Murphy's sign. No hernia.  Genitourinary:  Genitourinary Comments: Passage of small amounts of blood visible on Chuck underneath patient. Attempted pelvic exam, did not tolerate speculum examination.  Musculoskeletal: Normal range of motion.  Neurological: She is alert and oriented to person, place, and time. She has normal strength. No cranial nerve deficit or sensory deficit. Coordination normal. GCS eye subscore is 4. GCS verbal subscore is 5. GCS motor subscore is 6.  Skin: Skin is warm, dry and intact. No rash noted. No cyanosis.  Psychiatric: She has a normal mood and affect. Her speech is normal and behavior is  normal. Thought content normal.  Nursing note and vitals reviewed.    ED Treatments / Results  Labs (all labs ordered are listed, but only abnormal results are displayed) Labs Reviewed  CBC WITH DIFFERENTIAL/PLATELET - Abnormal; Notable for the following:       Result Value   RBC 3.03 (*)    Hemoglobin 8.8 (*)    HCT 27.1 (*)    RDW 16.0 (*)    Platelets 106 (*)    All other components within normal limits  PROTIME-INR  COMPREHENSIVE METABOLIC PANEL  I-STAT BETA HCG BLOOD, ED ([facility], [facility], [facility] ONLY)  TYPE AND SCREEN    EKG  EKG Interpretation None       Radiology No results found.  Procedures Procedures (including critical care time)  Medications Ordered in ED Medications  sodium chloride 0.9 % bolus 1,000 mL (not administered)     Initial Impression / Assessment and Plan / ED Course  I have reviewed the triage vital signs and the nursing notes.  Pertinent labs & imaging results that were available during my care of the patient were reviewed by me and considered in my medical decision making (see chart for details).     Patient with two-month history of vaginal bleeding, worsening over the last 24 hours. CBC reveals hemoglobinis 8.8, baseline appears to be around 12.5.She has very slight tachycardia with exertion, otherwise vital signs are stable. Will gently hydrate. Discussed with Dr. Glo Herring, requests pelvic ultrasound and will see patient in ER.  Final Clinical Impressions(s) / ED Diagnoses   Final diagnoses:  Abnormal uterine bleeding  Abnormal vaginal bleeding    New Prescriptions New Prescriptions   No medications on file     Orpah Greek, MD 11/23/16 1696    Orpah Greek, MD 11/23/16 [phone]

## 2016-11-25 LAB — GC/CHLAMYDIA PROBE AMP
Chlamydia trachomatis, NAA: NEGATIVE
NEISSERIA GONORRHOEAE BY PCR: NEGATIVE

## 2016-11-27 ENCOUNTER — Telehealth: Payer: Self-pay

## 2016-11-27 ENCOUNTER — Ambulatory Visit (INDEPENDENT_AMBULATORY_CARE_PROVIDER_SITE_OTHER): Payer: Medicaid Other | Admitting: Gastroenterology

## 2016-11-27 ENCOUNTER — Other Ambulatory Visit: Payer: Self-pay

## 2016-11-27 ENCOUNTER — Encounter: Payer: Self-pay | Admitting: Gastroenterology

## 2016-11-27 VITALS — BP 111/77 | HR 90 | Temp 98.7°F | Ht 63.0 in | Wt 261.2 lb

## 2016-11-27 DIAGNOSIS — K7460 Unspecified cirrhosis of liver: Secondary | ICD-10-CM

## 2016-11-27 DIAGNOSIS — R1310 Dysphagia, unspecified: Secondary | ICD-10-CM

## 2016-11-27 DIAGNOSIS — I8510 Secondary esophageal varices without bleeding: Secondary | ICD-10-CM | POA: Diagnosis not present

## 2016-11-27 DIAGNOSIS — D62 Acute posthemorrhagic anemia: Secondary | ICD-10-CM

## 2016-11-27 DIAGNOSIS — K7030 Alcoholic cirrhosis of liver without ascites: Secondary | ICD-10-CM | POA: Diagnosis not present

## 2016-11-27 DIAGNOSIS — R600 Localized edema: Secondary | ICD-10-CM

## 2016-11-27 DIAGNOSIS — I8500 Esophageal varices without bleeding: Secondary | ICD-10-CM

## 2016-11-27 DIAGNOSIS — R1319 Other dysphagia: Secondary | ICD-10-CM

## 2016-11-27 MED ORDER — SPIRONOLACTONE 50 MG PO TABS: 50.0000 mg | ORAL_TABLET | Freq: Every day | ORAL | 3 refills | Status: DC

## 2016-11-27 MED ORDER — FUROSEMIDE 20 MG PO TABS: 20.0000 mg | ORAL_TABLET | Freq: Every day | ORAL | 3 refills | Status: DC

## 2016-11-27 NOTE — Patient Instructions (Signed)
1. Upper endoscopy as scheduled. See separate instructions. 2. Please have your labs done at the hospital Wednesday when you go for your ultrasound. 3. You will need additional labs in approximately 10 days to see that your kidneys are tolerating the fluid pills. 4. We will make arrangements for you to follow-up with kidney doctor. 5. Return to our office in 4 months.

## 2016-11-27 NOTE — Assessment & Plan Note (Addendum)
Labs indicated stable MELD of 13. Patient really not interested in pursuing follow-up at [facility] liver [facility]. She is fairly vague about last alcoholic beverage. She has scheduled abdominal ultrasound for hepatoma surveillance this week. Will follow-up on that.  Procedure EGD with possible esophageal variceal banding, plus or minus esophageal dilation if possible (patient aware that dilation may not be possible in the setting of varices). Deep sedation in OR.  I have discussed the risks, alternatives, benefits with regards to but not limited to the risk of reaction to medication, bleeding, infection, perforation and the patient is agreeable to proceed. Written consent to be obtained.  Patient has had some lower extremity edema. Will restart low-dose Lasix/Aldactone given his slightly elevated creatinine. She has seen nephrology in the past for renal insufficiency, will set her up for a follow-up with Dr. Hinda Lenis. She will repeat met 7 in 10 days after starting diuretics.

## 2016-11-27 NOTE — Progress Notes (Signed)
cc'd to pcp 

## 2016-11-27 NOTE — Assessment & Plan Note (Signed)
Patient has had a significant decline in her hemoglobin from normal down to 8 range in the setting of heavy menses. Gynecology on board with evaluation and management. Patient denies anything suggestive of GI bleeding. We'll repeat her CBC when she is abdominal hospital on Wednesday for ultrasound to verify stability. She is somewhat symptomatic with mild lightheadedness and dyspnea on exertion if symptoms worsen she'll go to the nearest ER.

## 2016-11-27 NOTE — Telephone Encounter (Signed)
Tried to call pt to inform of pre-op appt 12/19/16 at 12:45pm, no answer, LMOAM. Letter also mailed.

## 2016-11-27 NOTE — Progress Notes (Signed)
Primary Care Physician:  Raylene Everts, MD  Primary Gastroenterologist:  Garfield Cornea, MD   Chief Complaint  Patient presents with  . Cirrhosis    "swelling"    HPI:  Whitney Bush is a 46 y.o. female here for follow-up. She needs to reschedule her EGD for esophageal varices, possible banding, dysphagia. She has a history of alcohol-related cirrhosis, esophageal varices. She canceled her EGD since her last office visit in May. She no showed for last appointment. She is here today for follow-up.   Patient states "it is been a while" since last alcohol use. She will not provide a specific date. Has been declining follow-up at [facility] liver clinic.  Currently she denies abdominal pain or fatigue. Bowel function is fine for the most part but she states she needs to restart her lactulose, she tends towards constipation. Denies melena or rectal bleeding. Continues to have some dysphagia to pill and meats. Has to wash the food down. She continues to have heavy menses. Recently vaginal bleeding extremely heavy and she had to see gynecology started Megace. Hemoglobin now down to 8 range. She has some mild lightheadedness she gets up too quick. Denies chest pain. Some shortness of breath with exertion. Her weight is up to 261 today. Back in June she was 230.   Denies melena or rectal bleeding. Complains of lower extremity edema. Feels like her legs are very tight. Sometimes abdomen feels hard.  Last colonoscopy May 2017, bowel prep was not adequate. Last EGD December 2016, grade 2 esophageal varices, moderate portal hypertensive gastropathy, gastritis. She recommended follow-up EGD.   Current Outpatient Prescriptions  Medication Sig Dispense Refill  . acetaminophen (TYLENOL) 325 MG tablet Take 650 mg by mouth every 6 (six) hours as needed for mild pain or moderate pain.    . B Complex-C (SUPER B COMPLEX PO) Take by mouth daily.    . Cholecalciferol (VITAMIN D PO) Take 1 tablet by mouth daily.     . Ferrous Sulfate (IRON) 325 (65 Fe) MG TABS Take 1 tablet by mouth daily.    Marland Kitchen FLUoxetine (PROZAC) 20 MG tablet Take 1 tablet (20 mg total) by mouth daily. 30 tablet 3  . furosemide (LASIX) 40 MG tablet Take 1 tablet (40 mg total) by mouth daily. 30 tablet 11  . Iron-FA-B Cmp-C-Biot-Probiotic (FUSION PLUS) CAPS Take 2 daily 60 capsule 1  . lactulose (CHRONULAC) 10 GM/15ML solution Take 15 mLs (10 g total) by mouth 2 (two) times daily. (Patient taking differently: Take 10 g by mouth daily as needed for mild constipation. ) 946 mL 3  . megestrol (MEGACE) 40 MG tablet Take 3 x 5 days then 2 x 5 days then 1 daily 45 tablet 0  . prazosin (MINIPRESS) 1 MG capsule Take 1 capsule (1 mg total) by mouth 2 (two) times daily. 60 capsule 1  . propranolol (INDERAL) 10 MG tablet Take 1 tablet (10 mg total) by mouth 3 (three) times daily. 90 tablet 1  . traZODone (DESYREL) 100 MG tablet Take 1 tablet (100 mg total) by mouth at bedtime. 30 tablet 1  . vitamin E 400 UNIT capsule Take 400 Units by mouth daily.     No current facility-administered medications for this visit.     Allergies as of 11/27/2016  . (No Known Allergies)    Past Medical History:  Diagnosis Date  . Alcohol abuse   . 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  Anemia   . Blood transfusion without reported diagnosis   . Boil 06/04/2015  . Chronic kidney disease   . Depression   . GERD (gastroesophageal reflux disease)   . GI (gastrointestinal bleed) 07/28/2015  . Heart murmur   . Hyperlipidemia   . Hypertension   . Hypothyroidism   . Migraines   . Peripheral edema   . Tubular adenoma     Past Surgical History:  Procedure Laterality Date  . ABCESS DRAINAGE     x5; neck, arm, chest, back  . BIOPSY  02/24/2015   Procedure: BIOPSY;  Surgeon: Danie Binder, MD;  Location[facility];  Service: Endoscopy;;  gastric bx's  . CESAREAN SECTION    . COLONOSCOPY WITH PROPOFOL N/A 06/28/2015   Dr.Rourk-  inadequate prep- One 6 mm polyp at the splenic flexure bx= tubular adenoma  . ESOPHAGOGASTRODUODENOSCOPY (EGD) WITH PROPOFOL N/A 02/24/2015   SLF: Grade II esophageal varices Moderate portal hypertensive gastropathy Mild erosive gastritis anemia likely due to many factors: gastritis, gastropathy, coagulopathy, chronic disease  . POLYPECTOMY  06/28/2015   Procedure: POLYPECTOMY;  Surgeon: Daneil Dolin, MD;  Location: [facility];  Service: Endoscopy;;  at splenic flexure    Family History  Problem Relation Age of Onset  . Diabetes Father   . Hyperlipidemia Father   . Alcohol abuse Father   . Cancer Other   . Cervical cancer Maternal Grandmother   . Lung cancer Maternal Grandfather   . Alcohol abuse Other        multiple family members  . Diabetes Sister   . Hypertension Brother   . Colon cancer Neg Hx   . Liver disease Neg Hx     Social History   Social History  . Marital status: Widowed    Spouse name: N/A  . Number of children: 1  . Years of education: 58   Occupational History  . unemployed    Social History Main Topics  . Smoking status: Former Smoker    Packs/day: 1.00    Years: 5.00    Types: Cigarettes    Quit date: 12/29/2010  . Smokeless tobacco: Former Systems developer    Types: Snuff  . Alcohol use No     Comment: 06/09/16 "some alcohol 3 mnths ago"; denied 10/1//18  . Drug use: No  . Sexual activity: Not Currently    Birth control/ protection: Pill   Other Topics Concern  . Not on file   Social History Narrative   Lives with parents      ROS:  General: Negative for anorexia, weight loss, fever, chills, fatigue, weakness. Eyes: Negative for vision changes.  ENT: Negative for hoarseness, difficulty swallowing , nasal congestion. CV: Negative for chest pain, angina, palpitations, dyspnea on exertion, peripheral edema.  Respiratory: Negative for dyspnea at rest, cough, sputum, wheezing. See hpi GI: See history of present illness. GU:  Negative for dysuria,  hematuria, urinary incontinence, urinary frequency, nocturnal urination.  MS: Negative for joint pain, low back pain.  Derm: Negative for rash or itching.  Neuro: Negative for weakness, abnormal sensation, seizure, frequent headaches, memory loss, confusion.  Psych: Negative for anxiety, depression, suicidal ideation, hallucinations.  Endo: see hpi Heme: Negative for bruising or bleeding. Allergy: Negative for rash or hives.    Physical Examination:  BP 111/77   Pulse 90   Temp 98.7 F (37.1 C) (Oral)   Ht 5\' 3"  (1.6 m)   Wt 261 lb 3.2 oz (118.5 kg)   LMP  11/20/2016 (Approximate)   BMI 46.27 kg/m    General: Well-nourished, well-developed in no acute distress.  Head: Normocephalic, atraumatic.   Eyes: Conjunctiva pink, no icterus. Mouth: Oropharyngeal mucosa moist and pink , no lesions erythema or exudate. Neck: Supple without thyromegaly, masses, or lymphadenopathy.  Lungs: Clear to auscultation bilaterally.  Heart: Regular rate and rhythm, no murmurs rubs or gallops.  Abdomen: Bowel sounds are normal, nontender, nondistended but obese, no hepatosplenomegaly or masses, no abdominal bruits or    hernia , no rebound or guarding.   Rectal: not performed Extremities: 1-2+ lower extremity edema to knees bilaterally. No clubbing or deformities.  Neuro: Alert and oriented x 4 , grossly normal neurologically.  Skin: Warm and dry, no rash or jaundice.   Psych: Alert and cooperative, normal mood and affect.  Labs: Lab Results  Component Value Date   CREATININE 1.15 (H) 11/23/2016   BUN 9 11/23/2016   NA 136 11/23/2016   K 3.2 (L) 11/23/2016   CL 102 11/23/2016   CO2 27 11/23/2016   Lab Results  Component Value Date   ALT 16 11/23/2016   AST 29 11/23/2016   ALKPHOS 62 11/23/2016   BILITOT 1.7 (H) 11/23/2016   Lab Results  Component Value Date   WBC 8.3 11/23/2016   HGB 8.8 (L) 11/23/2016   HCT 27.1 (L) 11/23/2016   MCV 89.4 11/23/2016   PLT 106 (L) 11/23/2016   Lab  Results  Component Value Date   INR 1.22 11/23/2016   INR 1.2 (H) 10/25/2015   INR 1.36 07/27/2015     Imaging Studies: US Pelvis Transvanginal Non-ob (tv Only)  Result Date: 11/23/2016 CLINICAL DATA:  Abnormal vaginal bleeding for the past 2 months with worsening symptoms over the past 24 hours. History previous Cesarean section. EXAM: TRANSABDOMINAL AND TRANSVAGINAL ULTRASOUND OF PELVIS TECHNIQUE: Both transabdominal and transvaginal ultrasound examinations of the pelvis were performed. Transabdominal technique was performed for global imaging of the pelvis including uterus, ovaries, adnexal regions, and pelvic cul-de-sac. It was necessary to proceed with endovaginal exam following the transabdominal exam to visualize the uterus, endometrium, ovaries, and adnexal regions. COMPARISON:  Pelvic ultrasound of June 06, 2006. FINDINGS: Uterus Measurements: 17.1 x 9.2 x 9.3 cm. There are fundal fibroids. One measures 4.4 cm in greatest dimension and the second which is to the left of midline in the fundus measures 5.2 cm in greatest dimension. Endometrium Thickness: 3 cm. There is irregular endometrial thickening. No discrete mass or polyp is observed. Right ovary Measurements: 3.6 x 2.5 x 2.9 cm. There is a right ovarian cystic structure measuring 2.2 x 1.8 x 1.9 cm. Left ovary Measurements: 2.7 x 1.7 x 2.4 cm. Normal appearance/no adnexal mass. Other findings No abnormal free fluid. IMPRESSION: Enlarged uterus containing fibroids. Marked endometrial thickening measuring up to 3 cm in diameter. If bleeding remains unresponsive to hormonal or medical therapy, focal lesion work-up with sonohysterogram should be considered. Endometrial biopsy should also be considered in pre-menopausal patients at high risk for endometrial carcinoma. (Ref: Radiological Reasoning: Algorithmic Workup of Abnormal Vaginal Bleeding with Endovaginal Sonography and Sonohysterography. AJR 2008; 536:U44-03) Simple appearing cysts in the  right ovary. Normal-appearing left ovary. No free pelvic fluid. Electronically Signed   By: David  Martinique M.D.   On: 11/23/2016 09:09   US Pelvis (transabdominal Only)  Result Date: 11/23/2016 CLINICAL DATA:  Abnormal vaginal bleeding for the past 2 months with worsening symptoms over the past 24 hours. History previous Cesarean section. EXAM: TRANSABDOMINAL AND TRANSVAGINAL  ULTRASOUND OF PELVIS TECHNIQUE: Both transabdominal and transvaginal ultrasound examinations of the pelvis were performed. Transabdominal technique was performed for global imaging of the pelvis including uterus, ovaries, adnexal regions, and pelvic cul-de-sac. It was necessary to proceed with endovaginal exam following the transabdominal exam to visualize the uterus, endometrium, ovaries, and adnexal regions. COMPARISON:  Pelvic ultrasound of June 06, 2006. FINDINGS: Uterus Measurements: 17.1 x 9.2 x 9.3 cm. There are fundal fibroids. One measures 4.4 cm in greatest dimension and the second which is to the left of midline in the fundus measures 5.2 cm in greatest dimension. Endometrium Thickness: 3 cm. There is irregular endometrial thickening. No discrete mass or polyp is observed. Right ovary Measurements: 3.6 x 2.5 x 2.9 cm. There is a right ovarian cystic structure measuring 2.2 x 1.8 x 1.9 cm. Left ovary Measurements: 2.7 x 1.7 x 2.4 cm. Normal appearance/no adnexal mass. Other findings No abnormal free fluid. IMPRESSION: Enlarged uterus containing fibroids. Marked endometrial thickening measuring up to 3 cm in diameter. If bleeding remains unresponsive to hormonal or medical therapy, focal lesion work-up with sonohysterogram should be considered. Endometrial biopsy should also be considered in pre-menopausal patients at high risk for endometrial carcinoma. (Ref: Radiological Reasoning: Algorithmic Workup of Abnormal Vaginal Bleeding with Endovaginal Sonography and Sonohysterography. AJR 2008; 268:T41-96) Simple appearing cysts in the  right ovary. Normal-appearing left ovary. No free pelvic fluid. Electronically Signed   By: David  Martinique M.D.   On: 11/23/2016 09:09

## 2016-11-28 ENCOUNTER — Telehealth: Payer: Self-pay | Admitting: General Practice

## 2016-11-28 NOTE — Telephone Encounter (Signed)
I spoke with Clent Ridges with [facility] and she stated that the case was approved and the MGNO#[id number]

## 2016-11-28 NOTE — Telephone Encounter (Signed)
Patient has been rescheduled to her original appt in the morning and she is aware

## 2016-11-28 NOTE — Telephone Encounter (Signed)
I spoke with the patient and she stated that she didn't have that abd u/s at [facility], because she didn't have transportation on the day it was ordered.  I gave her the number to Carleene Overlie so she could call her insurance company and let them know she didn't have an u/s done in 06/2016.  The patient called back and stated she called the insurance company to let them know she never had an abd u/s done in May of 2018.

## 2016-11-28 NOTE — Telephone Encounter (Signed)
I received a call from the patient stating her abd u/s was denied by her insurance company.    She was on the recall list for a repeat u/s for cirrhosis.  I called and spoke with Keisha T. With [employer] and she stated it was denied, because the patient had an abd u/s done in May of 2018.  She went on to say that we would need to call that provider to obtain the results from that u/s.  The provider was Primus Bravo with [facility] liver program.

## 2016-11-29 ENCOUNTER — Ambulatory Visit (HOSPITAL_COMMUNITY)
Admission: RE | Admit: 2016-11-29 | Discharge: 2016-11-29 | Disposition: A | Discharge status: Home / Self Care | Payer: Medicaid Other | Source: Ambulatory Visit | Attending: Gastroenterology | Admitting: Gastroenterology

## 2016-11-29 ENCOUNTER — Ambulatory Visit (HOSPITAL_COMMUNITY): Admission: RE | Admit: 2016-11-29 | Payer: Medicaid Other | Source: Ambulatory Visit

## 2016-11-29 DIAGNOSIS — K7689 Other specified diseases of liver: Secondary | ICD-10-CM | POA: Insufficient documentation

## 2016-11-29 DIAGNOSIS — K8020 Calculus of gallbladder without cholecystitis without obstruction: Secondary | ICD-10-CM | POA: Diagnosis not present

## 2016-11-29 DIAGNOSIS — K7030 Alcoholic cirrhosis of liver without ascites: Secondary | ICD-10-CM | POA: Diagnosis present

## 2016-11-30 ENCOUNTER — Other Ambulatory Visit: Payer: Self-pay | Admitting: Obstetrics and Gynecology

## 2016-11-30 ENCOUNTER — Encounter: Payer: Self-pay | Admitting: Obstetrics and Gynecology

## 2016-11-30 ENCOUNTER — Ambulatory Visit (INDEPENDENT_AMBULATORY_CARE_PROVIDER_SITE_OTHER): Payer: Medicaid Other | Admitting: Obstetrics and Gynecology

## 2016-11-30 VITALS — BP 116/68 | HR 105 | Ht 63.0 in | Wt 261.0 lb

## 2016-11-30 DIAGNOSIS — N920 Excessive and frequent menstruation with regular cycle: Secondary | ICD-10-CM | POA: Diagnosis not present

## 2016-11-30 DIAGNOSIS — Z3202 Encounter for pregnancy test, result negative: Secondary | ICD-10-CM | POA: Diagnosis not present

## 2016-11-30 DIAGNOSIS — D259 Leiomyoma of uterus, unspecified: Secondary | ICD-10-CM

## 2016-11-30 LAB — POCT URINE PREGNANCY: PREG TEST UR: NEGATIVE

## 2016-11-30 NOTE — Progress Notes (Addendum)
Whitney Bush is a 46 y.o. female here for an endometrial biopsy due to menorrhagia and thickened endometrium.    Endometrial Biopsy: Patient given informed consent, signed copy in the chart, time out was performed. Time out taken. The patient was placed in the lithotomy position and the cervix brought into view with sterile speculum.  Portio of cervix cleansed x 2 with betadine swabs.  A tenaculum was placed in the anterior lip of the cervix. The uterus was sounded for depth of 12 cm,. Milex uterine Explora 3 mm was introduced to into the uterus, suction created,  and an endometrial sample was obtained. All equipment was removed and accounted for.   A review of the ultrasound counsel The patient tolerated the procedure well.    Patient given post procedure instructions.   Discussion: 1. Discussed with pt the risks and benefits of removing fibroids, or uterus. Bleeding and fertility was also discussed with pt. A review of the ultrasound shows a moderately thickened endometrium, and core ultrasound image quality makes interpretation difficult but there appears to be intramural fibroids number and no suspicion of fibroids protruding into the endometrial cavity At end of discussion, pt had opportunity to ask questions and has no further questions at this time.   Specific discussion as noted above. Greater than 50% was spent in counseling and coordination of care with the patient.   Total time greater than: 25 minutes.    Followup: f/u in 1 week to go over U/S results and biopsy results   Note: pt wants to remove fibroids., But is resistant to considering hysterectomy even though there is no plans for future childbearing will need to discuss this further     By signing my name below, I, Izna Ahmed, attest that this documentation has been prepared under the direction and in the presence of Jonnie Kind, MD. Electronically Signed: Jabier Gauss, Medical Scribe. 11/30/16. 1:22 PM.  I  personally performed the services described in this documentation, which was SCRIBED in my presence. The recorded information has been reviewed and considered accurate. It has been edited as necessary during review. Jonnie Kind, MD

## 2016-12-07 ENCOUNTER — Ambulatory Visit (INDEPENDENT_AMBULATORY_CARE_PROVIDER_SITE_OTHER): Payer: Medicaid Other | Admitting: Obstetrics and Gynecology

## 2016-12-07 VITALS — BP 126/70 | HR 80 | Ht 63.0 in | Wt 266.0 lb

## 2016-12-07 DIAGNOSIS — N939 Abnormal uterine and vaginal bleeding, unspecified: Secondary | ICD-10-CM | POA: Diagnosis not present

## 2016-12-07 NOTE — Progress Notes (Signed)
No hepatoma. Repeat ruq u/s in 6 months.

## 2016-12-07 NOTE — Progress Notes (Signed)
[facility] Visit  12/07/2016            Patient name: Whitney Bush MRN [id number]  Date of birth: 20-Feb-1971  CC & HPI:  Whitney Bush is a 46 y.o. female presenting today for f/u from endometrial biopsy on 11/30/16 due to menorrhagia and thickened endometrium. Pt had an U/S on 11/29/16 Pt wants fibroids removed, but is resistant to considering hysterectomy, even though she has no future childbearing plans. Pt has had heavy bleeding and passing clots for 2.5 months, and was seen at the ER on 11/23/16 for same and iron deficiency anemia due to chronic blood loss. Pt is seeing a gastroenterologist. Pt is still taking Megace. She states her bleeding has stopped, and is barely showing as spotting.  Results of U/S: showed a moderately thickened endometrium, and core ultrasound image quality makes interpretation difficult but there appears to be intramural fibroids number and no suspicion of fibroids protruding into the endometrial cavity   ROS:  ROS  +AUB +clotting  All systems are negative except as noted in the HPI and PMH.    Pertinent History Reviewed:   Reviewed: Significant Medical         Past Medical History:  Diagnosis Date  . Alcohol abuse   . 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 Anemia   . Blood transfusion without reported diagnosis   . Boil 06/04/2015  . Chronic kidney disease   . Depression   . GERD (gastroesophageal reflux disease)   . GI (gastrointestinal bleed) 07/28/2015  . Heart murmur   . Hyperlipidemia   . Hypertension   . Hypothyroidism   . Migraines   . OCD (obsessive compulsive disorder)   . Peripheral edema   . PTSD (post-traumatic stress disorder)   . Tubular adenoma                               Surgical Hx:    Past Surgical History:  Procedure Laterality Date  . ABCESS DRAINAGE     x5; neck, arm, chest, back  . BIOPSY  02/24/2015   Procedure: BIOPSY;  Surgeon: Danie Binder, MD;  Location: [facility]  ENDO SUITE;  Service: Endoscopy;;  gastric bx's  . CESAREAN SECTION    . COLONOSCOPY WITH PROPOFOL N/A 06/28/2015   Dr.Rourk- inadequate prep- One 6 mm polyp at the splenic flexure bx= tubular adenoma  . ESOPHAGOGASTRODUODENOSCOPY (EGD) WITH PROPOFOL N/A 02/24/2015   SLF: Grade II esophageal varices Moderate portal hypertensive gastropathy Mild erosive gastritis anemia likely due to many factors: gastritis, gastropathy, coagulopathy, chronic disease  . POLYPECTOMY  06/28/2015   Procedure: POLYPECTOMY;  Surgeon: Daneil Dolin, MD;  Location: [facility] ENDO SU[facility];  Service: Endoscopy;;  at splenic flexure   Medications: Reviewed & Updated - see associated section                       Current Outpatient Prescriptions:  .  acetaminophen (TYLENOL) 325 MG tablet, Take 650 mg by mouth every 6 (six) hours as needed for mild pain or moderate pain., Disp: , Rfl:  .  B Complex-C (SUPER B COMPLEX PO), Take by mouth daily., Disp: , Rfl:  .  Cholecalciferol (VITAMIN D PO), Take 1 tablet by mouth daily., Disp: , Rfl:  .  Ferrous Sulfate (IRON) 325 (65 Fe) MG TABS, Take 1  tablet by mouth daily., Disp: , Rfl:  .  FLUoxetine (PROZAC) 20 MG tablet, Take 1 tablet (20 mg total) by mouth daily., Disp: 30 tablet, Rfl: 3 .  furosemide (LASIX) 20 MG tablet, Take 1 tablet (20 mg total) by mouth daily., Disp: 30 tablet, Rfl: 3 .  Iron-FA-B Cmp-C-Biot-Probiotic (FUSION PLUS) CAPS, Take 2 daily (Patient not taking: Reported on 11/30/2016), Disp: 60 capsule, Rfl: 1 .  lactulose (CHRONULAC) 10 GM/15ML solution, Take 15 mLs (10 g total) by mouth 2 (two) times daily. (Patient taking differently: Take 10 g by mouth daily as needed for mild constipation. ), Disp: 946 mL, Rfl: 3 .  megestrol (MEGACE) 40 MG tablet, Take 3 x 5 days then 2 x 5 days then 1 daily, Disp: 45 tablet, Rfl: 0 .  prazosin (MINIPRESS) 1 MG capsule, Take 1 capsule (1 mg total) by mouth 2 (two) times daily., Disp: 60 capsule, Rfl: 1 .  propranolol (INDERAL) 10 MG  tablet, Take 1 tablet (10 mg total) by mouth 3 (three) times daily., Disp: 90 tablet, Rfl: 1 .  spironolactone (ALDACTONE) 50 MG tablet, Take 1 tablet (50 mg total) by mouth daily. (Patient not taking: Reported on 11/30/2016), Disp: 30 tablet, Rfl: 3 .  traZODone (DESYREL) 100 MG tablet, Take 1 tablet (100 mg total) by mouth at bedtime., Disp: 30 tablet, Rfl: 1 .  vitamin E 400 UNIT capsule, Take 400 Units by mouth daily., Disp: , Rfl:    Social History: Reviewed -  reports that she quit smoking about 5 years ago. Her smoking use included Cigarettes. She has a 5.00 pack-year smoking history. She has quit using smokeless tobacco. Her smokeless tobacco use included Snuff.  Objective Findings:  Vitals: Last menstrual period 11/20/2016.  Physical Examination: General appearance - alert, well appearing, and in no distress Mental status - alert, oriented to person, place, and time Pelvic - Not indicated   Discussion: 1. Discussed with pt results of endometrial biopsy, benign results noted to pt. Pt advised to stop taking Megace, and f/u in 8 weeks after having two normal menses cycles.  At end of discussion, pt had opportunity to ask questions and has no further questions at this time.   Specific discussion as noted above. Greater than 50% was spent in counseling and coordination of care with the patient.   Total time greater than: 25 minutes.     Assessment & Plan:   A:  1. , Menorrhagia resolved, benign endometrium  P:  1. Discontinue Megace, allow spontaneous menses for 2 months 2. F/u in 8 weeks or PRN    By signing my name below, I, Izna Ahmed, attest that this documentation has been prepared under the direction and in the presence of Jonnie Kind, MD. Electronically Signed: Jabier Gauss, Medical Scribe. 12/07/16. 11:49 AM.  I personally performed the services described in this documentation, which was SCRIBED in my presence. The recorded information has been reviewed and  considered accurate. It has been edited as necessary during review. Jonnie Kind, MD

## 2016-12-10 IMAGING — DX DG ABDOMEN ACUTE W/ 1V CHEST
3 series · 3 of 3 positions shown · non-contrast
Comparison: July 08, 2014.

CLINICAL DATA: Generalized abdominal pain for several weeks.

EXAM:
DG ABDOMEN ACUTE W/ 1V CHEST

[chest pa]
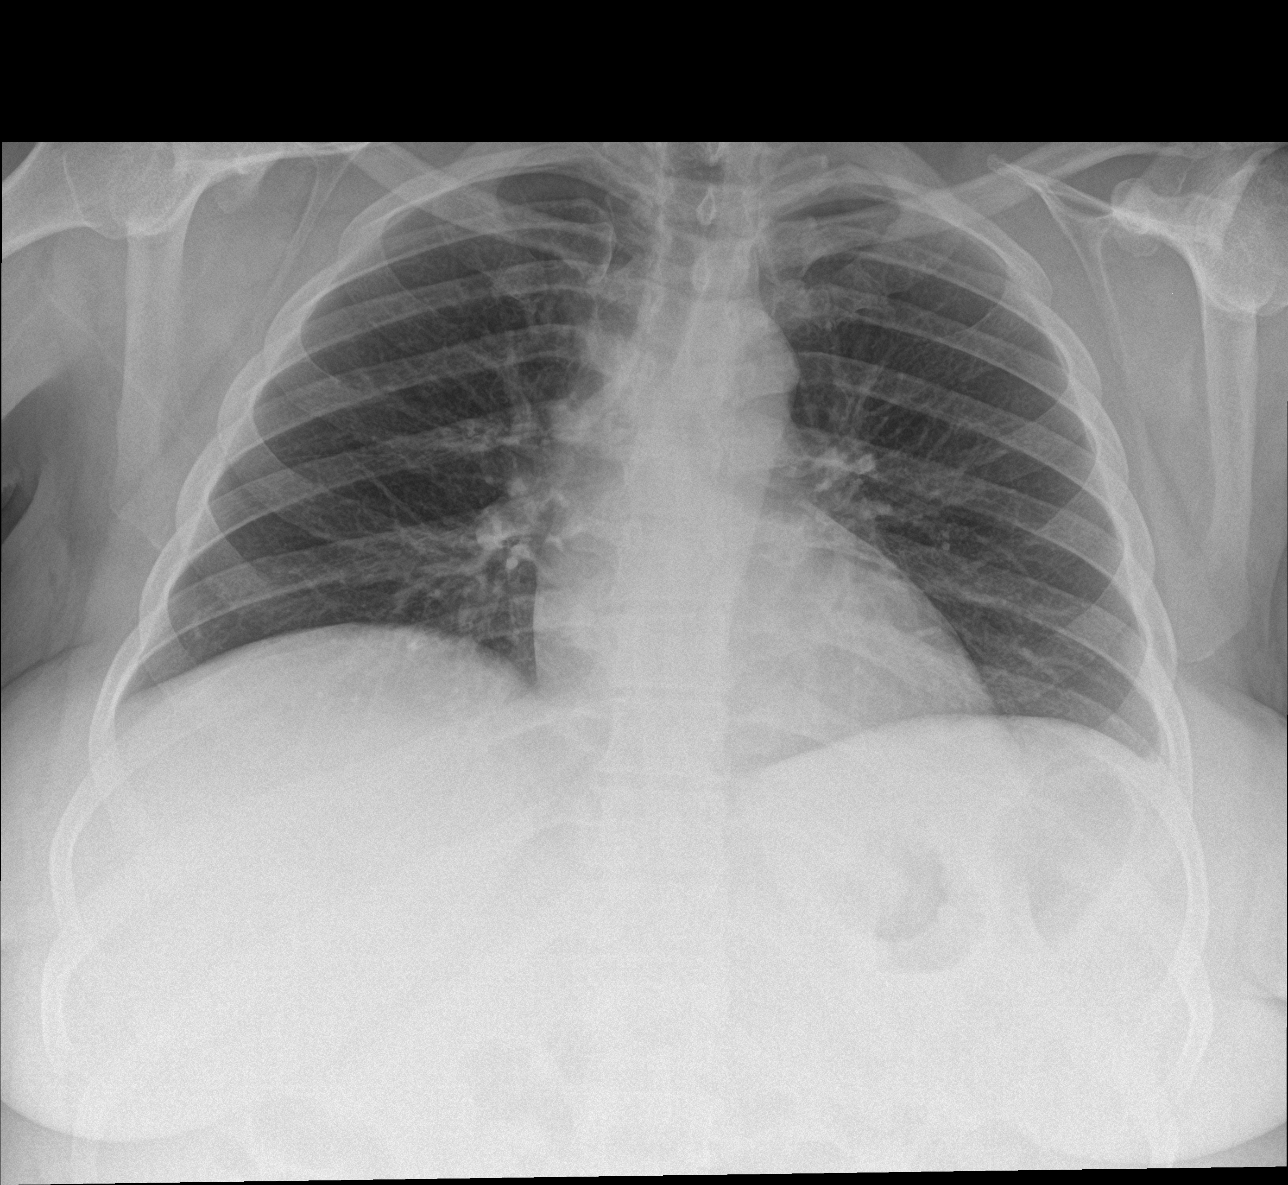

[abdomen erect]
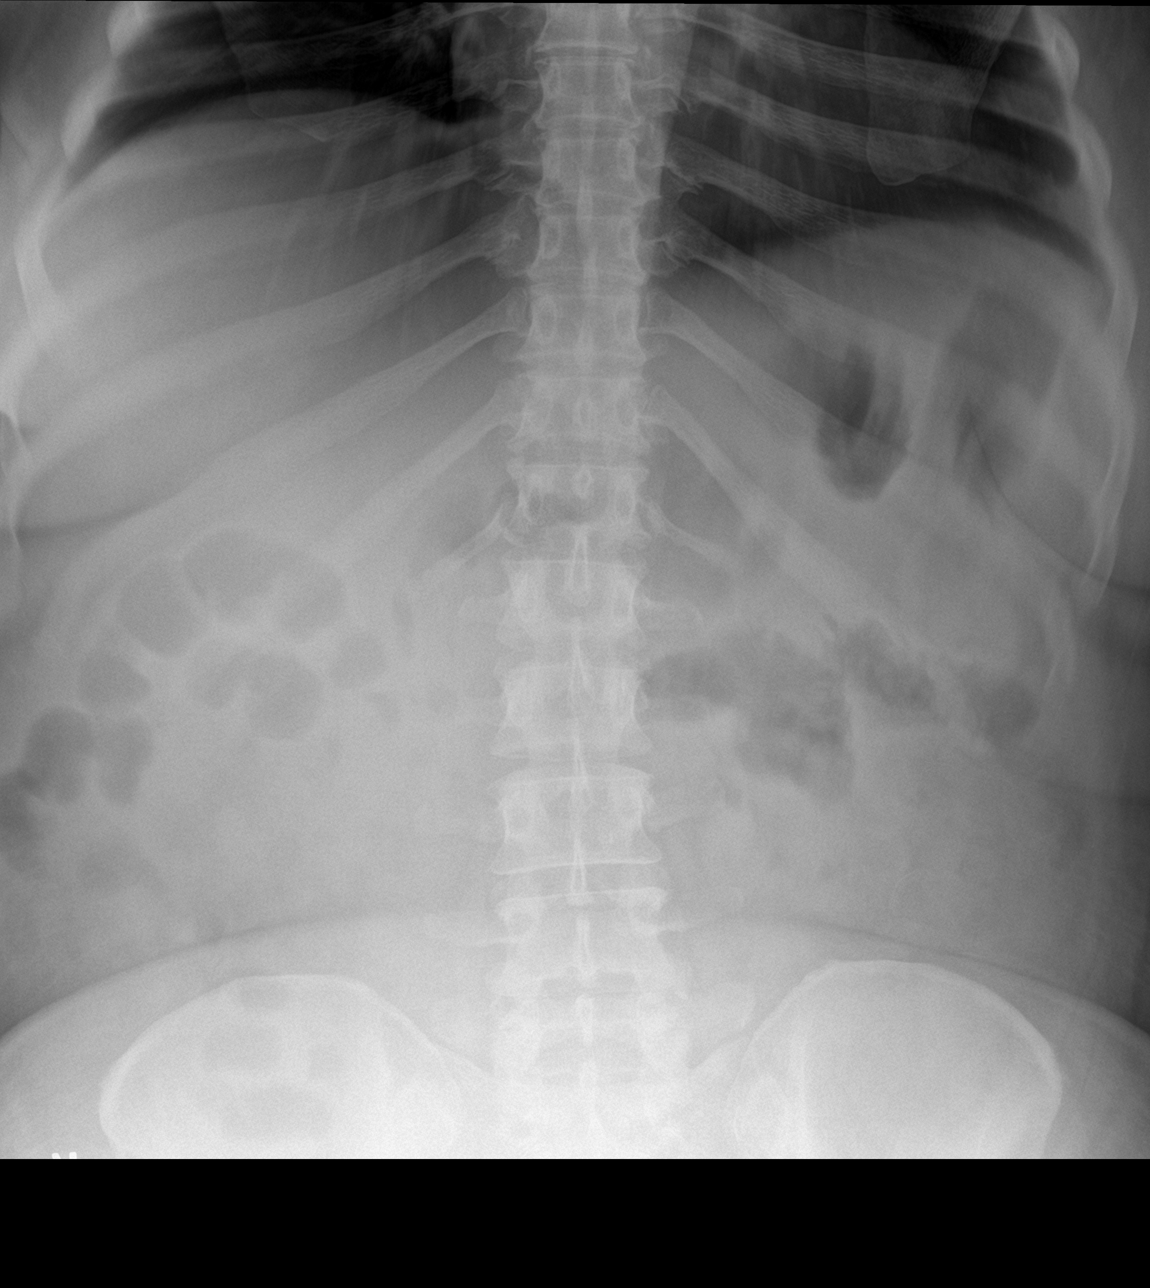

[abdomen supine]
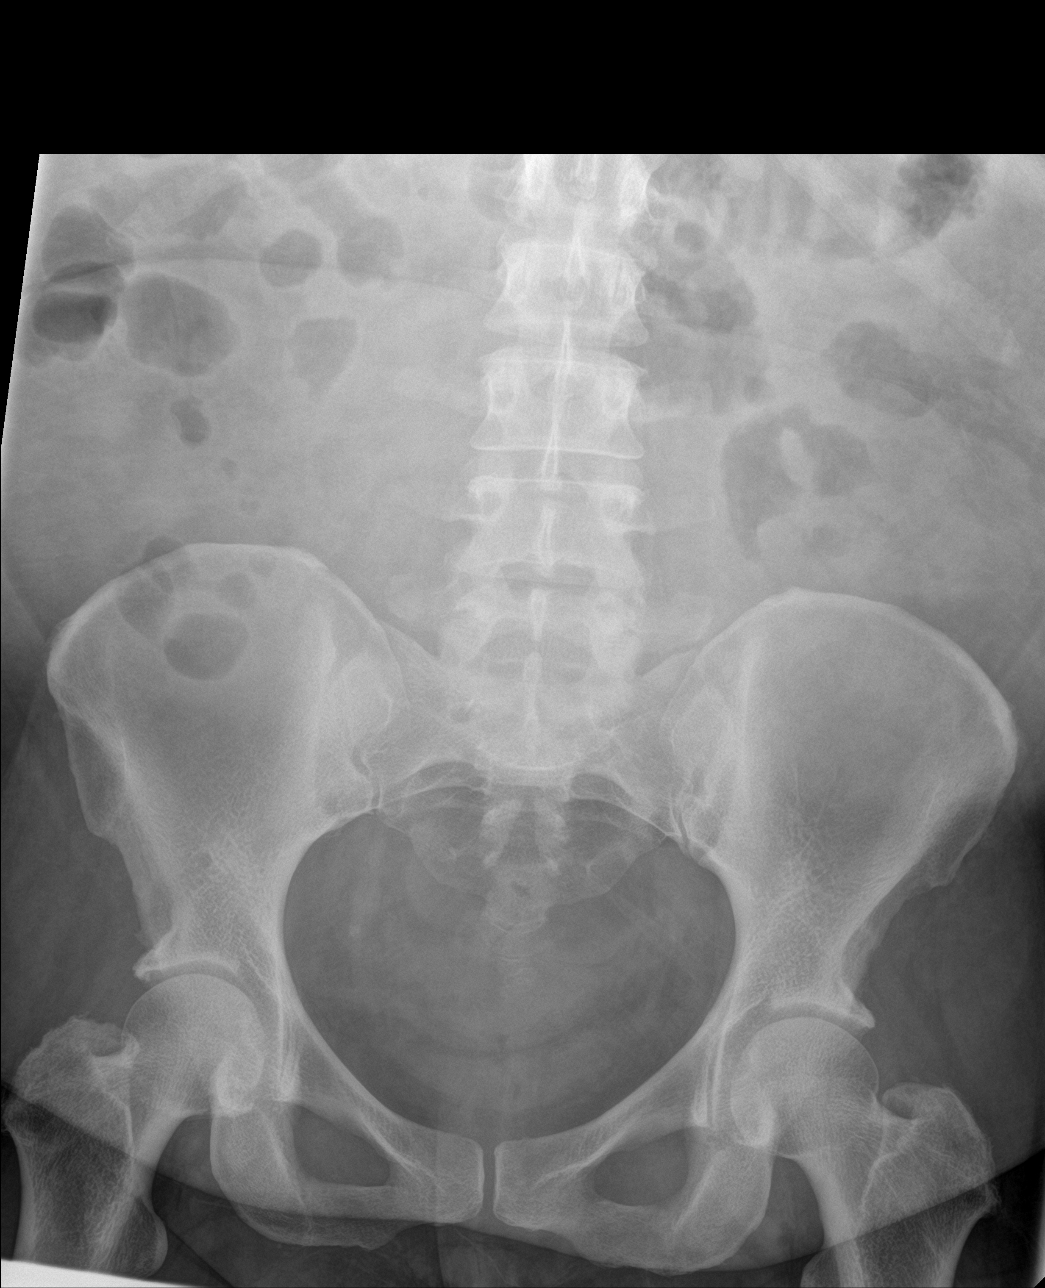

[3 of 3 positions shown; findings below may reference images not displayed]

FINDINGS: There is no evidence of dilated bowel loops or free intraperitoneal
air. No radiopaque calculi or other significant radiographic
abnormality is seen. Heart size and mediastinal contours are within
normal limits. Both lungs are clear.
IMPRESSION: No evidence of bowel obstruction or ileus. No acute cardiopulmonary
disease.

## 2016-12-11 NOTE — Progress Notes (Signed)
ON RECALL  °

## 2016-12-15 NOTE — Patient Instructions (Signed)
Whitney Bush  12/15/2016     @PREFPERIOPPHARMACY @   Your procedure is scheduled on  12/25/2016   Report to [facility] at  17  A.M.  Call this number if you have problems the morning of surgery:  [phone]   Remember:  Do not eat food or drink liquids after midnight.  Take these medicines the morning of surgery with A SIP OF WATER  Prozac, minipress, propranolol.   Do not wear jewelry, make-up or nail polish.  Do not wear lotions, powders, or perfumes, or deoderant.  Do not shave 48 hours prior to surgery.  Men may shave face and neck.  Do not bring valuables to the hospital.  [facility] is not responsible for any belongings or valuables.  Contacts, dentures or bridgework may not be worn into surgery.  Leave your suitcase in the car.  After surgery it may be brought to your room.  For patients admitted to the hospital, discharge time will be determined by your treatment team.  Patients discharged the day of surgery will not be allowed to drive home.   Name and phone number of your driver:   family Special instructions:  Follow the diet and prep instructions given to you by Dr Roseanne Kaufman office.  Please read over the following fact sheets that you were given. Anesthesia Post-op Instructions and Care and Recovery After Surgery       Esophagogastroduodenoscopy Esophagogastroduodenoscopy (EGD) is a procedure to examine the lining of the esophagus, stomach, and first part of the small intestine (duodenum). This procedure is done to check for problems such as inflammation, bleeding, ulcers, or growths. During this procedure, a long, flexible, lighted tube with a camera attached (endoscope) is inserted down the throat. Tell a health care provider about:  Any allergies you have.  All medicines you are taking, including vitamins, herbs, eye drops, creams, and over-the-counter medicines.  Any problems you or family members have had with anesthetic  medicines.  Any blood disorders you have.  Any surgeries you have had.  Any medical conditions you have.  Whether you are pregnant or may be pregnant. What are the risks? Generally, this is a safe procedure. However, problems may occur, including:  Infection.  Bleeding.  A tear (perforation) in the esophagus, stomach, or duodenum.  Trouble breathing.  Excessive sweating.  Spasms of the larynx.  A slowed heartbeat.  Low blood pressure.  What happens before the procedure?  Follow instructions from your health care provider about eating or drinking restrictions.  Ask your health care provider about: ? Changing or stopping your regular medicines. This is especially important if you are taking diabetes medicines or blood thinners. ? Taking medicines such as aspirin and ibuprofen. These medicines can thin your blood. Do not take these medicines before your procedure if your health care provider instructs you not to.  Plan to have someone take you home after the procedure.  If you wear dentures, be ready to remove them before the procedure. What happens during the procedure?  To reduce your risk of infection, your health care team will wash or sanitize their hands.  An IV tube will be put in a vein in your hand or arm. You will get medicines and fluids through this tube.  You will be given one or more of the following: ? A medicine to help you relax (sedative). ? A medicine to numb the area (local anesthetic). This medicine  may be sprayed into your throat. It will make you feel more comfortable and keep you from gagging or coughing during the procedure. ? A medicine for pain.  A mouth guard may be placed in your mouth to protect your teeth and to keep you from biting on the endoscope.  You will be asked to lie on your left side.  The endoscope will be lowered down your throat into your esophagus, stomach, and duodenum.  Air will be put into the endoscope. This will  help your health care provider see better.  The lining of your esophagus, stomach, and duodenum will be examined.  Your health care provider may: ? Take a tissue sample so it can be looked at in a lab (biopsy). ? Remove growths. ? Remove objects (foreign bodies) that are stuck. ? Treat any bleeding with medicines or other devices that stop tissue from bleeding. ? Widen (dilate) or stretch narrowed areas of your esophagus and stomach.  The endoscope will be taken out. The procedure may vary among health care providers and hospitals. What happens after the procedure?  Your blood pressure, heart rate, breathing rate, and blood oxygen level will be monitored often until the medicines you were given have worn off.  Do not eat or drink anything until the numbing medicine has worn off and your gag reflex has returned. This information is not intended to replace advice given to you by your health care provider. Make sure you discuss any questions you have with your health care provider. Document Released: 06/16/2004 Document Revised: 07/22/2015 Document Reviewed: 01/07/2015 Elsevier Interactive Patient Education  2018 [employer]. Esophagogastroduodenoscopy, Care After Refer to this sheet in the next few weeks. These instructions provide you with information about caring for yourself after your procedure. Your health care provider may also give you more specific instructions. Your treatment has been planned according to current medical practices, but problems sometimes occur. Call your health care provider if you have any problems or questions after your procedure. What can I expect after the procedure? After the procedure, it is common to have:  A sore throat.  Nausea.  Bloating.  Dizziness.  Fatigue.  Follow these instructions at home:  Do not eat or drink anything until the numbing medicine (local anesthetic) has worn off and your gag reflex has returned. You will know that the  local anesthetic has worn off when you can swallow comfortably.  Do not drive for 24 hours if you received a medicine to help you relax (sedative).  If your health care provider took a tissue sample for testing during the procedure, make sure to get your test results. This is your responsibility. Ask your health care provider or the department performing the test when your results will be ready.  Keep all follow-up visits as told by your health care provider. This is important. Contact a health care provider if:  You cannot stop coughing.  You are not urinating.  You are urinating less than usual. Get help right away if:  You have trouble swallowing.  You cannot eat or drink.  You have throat or chest pain that gets worse.  You are dizzy or light-headed.  You faint.  You have nausea or vomiting.  You have chills.  You have a fever.  You have severe abdominal pain.  You have black, tarry, or bloody stools. This information is not intended to replace advice given to you by your health care provider. Make sure you discuss any questions you have  with your health care provider. Document Released: 01/31/2012 Document Revised: 07/22/2015 Document Reviewed: 01/07/2015 Elsevier Interactive Patient Education  2018 [employer].  Esophageal Dilatation Esophageal dilatation is a procedure to open a blocked or narrowed part of the esophagus. The esophagus is the long tube in your throat that carries food and liquid from your mouth to your stomach. The procedure is also called esophageal dilation. You may need this procedure if you have a buildup of scar tissue in your esophagus that makes it difficult, painful, or even impossible to swallow. This can be caused by gastroesophageal reflux disease (GERD). In rare cases, people need this procedure because they have cancer of the esophagus or a problem with the way food moves through the esophagus. Sometimes you may need to have another  dilatation to enlarge the opening of the esophagus gradually. Tell a health care provider about:  Any allergies you have.  All medicines you are taking, including vitamins, herbs, eye drops, creams, and over-the-counter medicines.  Any problems you or family members have had with anesthetic medicines.  Any blood disorders you have.  Any surgeries you have had.  Any medical conditions you have.  Any antibiotic medicines you are required to take before dental procedures. What are the risks? Generally, this is a safe procedure. However, problems can occur and include:  Bleeding from a tear in the lining of the esophagus.  A hole (perforation) in the esophagus.  What happens before the procedure?  Do not eat or drink anything after midnight on the night before the procedure or as directed by your health care provider.  Ask your health care provider about changing or stopping your regular medicines. This is especially important if you are taking diabetes medicines or blood thinners.  Plan to have someone take you home after the procedure. What happens during the procedure?  You will be given a medicine that makes you relaxed and sleepy (sedative).  A medicine may be sprayed or gargled to numb the back of the throat.  Your health care provider can use various instruments to do an esophageal dilatation. During the procedure, the instrument used will be placed in your mouth and passed down into your esophagus. Options include: ? Simple dilators. This instrument is carefully placed in the esophagus to stretch it. ? Guided wire bougies. In this method, a flexible tube (endoscope) is used to insert a wire into the esophagus. The dilator is passed over this wire to enlarge the esophagus. Then the wire is removed. ? Balloon dilators. An endoscope with a small balloon at the end is passed down into the esophagus. Inflating the balloon gently stretches the esophagus and opens it up. What  happens after the procedure?  Your blood pressure, heart rate, breathing rate, and blood oxygen level will be monitored often until the medicines you were given have worn off.  Your throat may feel slightly sore and will probably still feel numb. This will improve slowly over time.  You will not be allowed to eat or drink until the throat numbness has resolved.  If this is a same-day procedure, you may be allowed to go home once you have been able to drink, urinate, and sit on the edge of the bed without nausea or dizziness.  If this is a same-day procedure, you should have a friend or family member with you for the next 24 hours after the procedure. This information is not intended to replace advice given to you by your health care provider.  Make sure you discuss any questions you have with your health care provider. Document Released: 04/06/2005 Document Revised: 07/22/2015 Document Reviewed: 06/25/2013 Elsevier Interactive Patient Education  2017 [employer] Anesthesia is a term that refers to techniques, procedures, and medicines that help a person stay safe and comfortable during a medical procedure. Monitored anesthesia care, or sedation, is one type of anesthesia. Your anesthesia specialist may recommend sedation if you will be having a procedure that does not require you to be unconscious, such as:  Cataract surgery.  A dental procedure.  A biopsy.  A colonoscopy.  During the procedure, you may receive a medicine to help you relax (sedative). There are three levels of sedation:  Mild sedation. At this level, you may feel awake and relaxed. You will be able to follow directions.  Moderate sedation. At this level, you will be sleepy. You may not remember the procedure.  Deep sedation. At this level, you will be asleep. You will not remember the procedure.  The more medicine you are given, the deeper your level of sedation will be. Depending on how  you respond to the procedure, the anesthesia specialist may change your level of sedation or the type of anesthesia to fit your needs. An anesthesia specialist will monitor you closely during the procedure. Let your health care provider know about:  Any allergies you have.  All medicines you are taking, including vitamins, herbs, eye drops, creams, and over-the-counter medicines.  Any use of steroids (by mouth or as a cream).  Any problems you or family members have had with sedatives and anesthetic medicines.  Any blood disorders you have.  Any surgeries you have had.  Any medical conditions you have, such as sleep apnea.  Whether you are pregnant or may be pregnant.  Any use of cigarettes, alcohol, or street drugs. What are the risks? Generally, this is a safe procedure. However, problems may occur, including:  Getting too much medicine (oversedation).  Nausea.  Allergic reaction to medicines.  Trouble breathing. If this happens, a breathing tube may be used to help with breathing. It will be removed when you are awake and breathing on your own.  Heart trouble.  Lung trouble.  Before the procedure Staying hydrated Follow instructions from your health care provider about hydration, which may include:  Up to 2 hours before the procedure - you may continue to drink clear liquids, such as water, clear fruit juice, black coffee, and plain tea.  Eating and drinking restrictions Follow instructions from your health care provider about eating and drinking, which may include:  8 hours before the procedure - stop eating heavy meals or foods such as meat, fried foods, or fatty foods.  6 hours before the procedure - stop eating light meals or foods, such as toast or cereal.  6 hours before the procedure - stop drinking milk or drinks that contain milk.  2 hours before the procedure - stop drinking clear liquids.  Medicines Ask your health care provider about:  Changing or  stopping your regular medicines. This is especially important if you are taking diabetes medicines or blood thinners.  Taking medicines such as aspirin and ibuprofen. These medicines can thin your blood. Do not take these medicines before your procedure if your health care provider instructs you not to.  Tests and exams  You will have a physical exam.  You may have blood tests done to show: ? How well your kidneys and liver are working. ?  How well your blood can clot.  General instructions  Plan to have someone take you home from the hospital or clinic.  If you will be going home right after the procedure, plan to have someone with you for 24 hours.  What happens during the procedure?  Your blood pressure, heart rate, breathing, level of pain and overall condition will be monitored.  An IV tube will be inserted into one of your veins.  Your anesthesia specialist will give you medicines as needed to keep you comfortable during the procedure. This may mean changing the level of sedation.  The procedure will be performed. After the procedure  Your blood pressure, heart rate, breathing rate, and blood oxygen level will be monitored until the medicines you were given have worn off.  Do not drive for 24 hours if you received a sedative.  You may: ? Feel sleepy, clumsy, or nauseous. ? Feel forgetful about what happened after the procedure. ? Have a sore throat if you had a breathing tube during the procedure. ? Vomit. This information is not intended to replace advice given to you by your health care provider. Make sure you discuss any questions you have with your health care provider. Document Released: 11/09/2004 Document Revised: 07/23/2015 Document Reviewed: 06/06/2015 Elsevier Interactive Patient Education  2018 [employer], Care After These instructions provide you with information about caring for yourself after your procedure. Your health care  provider may also give you more specific instructions. Your treatment has been planned according to current medical practices, but problems sometimes occur. Call your health care provider if you have any problems or questions after your procedure. What can I expect after the procedure? After your procedure, it is common to:  Feel sleepy for several hours.  Feel clumsy and have poor balance for several hours.  Feel forgetful about what happened after the procedure.  Have poor judgment for several hours.  Feel nauseous or vomit.  Have a sore throat if you had a breathing tube during the procedure.  Follow these instructions at home: For at least 24 hours after the procedure:   Do not: ? Participate in activities in which you could fall or become injured. ? Drive. ? Use heavy machinery. ? Drink alcohol. ? Take sleeping pills or medicines that cause drowsiness. ? Make important decisions or sign legal documents. ? Take care of children on your own.  Rest. Eating and drinking  Follow the diet that is recommended by your health care provider.  If you vomit, drink water, juice, or soup when you can drink without vomiting.  Make sure you have little or no nausea before eating solid foods. General instructions  Have a responsible adult stay with you until you are awake and alert.  Take over-the-counter and prescription medicines only as told by your health care provider.  If you smoke, do not smoke without supervision.  Keep all follow-up visits as told by your health care provider. This is important. Contact a health care provider if:  You keep feeling nauseous or you keep vomiting.  You feel light-headed.  You develop a rash.  You have a fever. Get help right away if:  You have trouble breathing. This information is not intended to replace advice given to you by your health care provider. Make sure you discuss any questions you have with your health care  provider. Document Released: 06/06/2015 Document Revised: 10/06/2015 Document Reviewed: 06/06/2015 Elsevier Interactive Patient Education  [employer].

## 2016-12-19 ENCOUNTER — Encounter (HOSPITAL_COMMUNITY)
Admission: RE | Admit: 2016-12-19 | Discharge: 2016-12-19 | Disposition: A | Discharge status: Home / Self Care | Payer: Medicaid Other | Source: Ambulatory Visit | Attending: Internal Medicine | Admitting: Internal Medicine

## 2016-12-19 ENCOUNTER — Encounter (HOSPITAL_COMMUNITY): Payer: Self-pay

## 2016-12-19 DIAGNOSIS — F329 Major depressive disorder, single episode, unspecified: Secondary | ICD-10-CM | POA: Diagnosis not present

## 2016-12-19 DIAGNOSIS — K219 Gastro-esophageal reflux disease without esophagitis: Secondary | ICD-10-CM | POA: Diagnosis not present

## 2016-12-19 DIAGNOSIS — D649 Anemia, unspecified: Secondary | ICD-10-CM | POA: Diagnosis not present

## 2016-12-19 DIAGNOSIS — R188 Other ascites: Secondary | ICD-10-CM | POA: Insufficient documentation

## 2016-12-19 DIAGNOSIS — I8500 Esophageal varices without bleeding: Secondary | ICD-10-CM | POA: Diagnosis not present

## 2016-12-19 DIAGNOSIS — I10 Essential (primary) hypertension: Secondary | ICD-10-CM | POA: Diagnosis not present

## 2016-12-19 DIAGNOSIS — F419 Anxiety disorder, unspecified: Secondary | ICD-10-CM | POA: Diagnosis not present

## 2016-12-19 DIAGNOSIS — Z01812 Encounter for preprocedural laboratory examination: Secondary | ICD-10-CM | POA: Diagnosis present

## 2016-12-19 DIAGNOSIS — E039 Hypothyroidism, unspecified: Secondary | ICD-10-CM | POA: Insufficient documentation

## 2016-12-19 DIAGNOSIS — K7460 Unspecified cirrhosis of liver: Secondary | ICD-10-CM | POA: Insufficient documentation

## 2016-12-19 DIAGNOSIS — Z87891 Personal history of nicotine dependence: Secondary | ICD-10-CM | POA: Diagnosis not present

## 2016-12-19 DIAGNOSIS — K759 Inflammatory liver disease, unspecified: Secondary | ICD-10-CM | POA: Diagnosis not present

## 2016-12-19 LAB — CBC WITH DIFFERENTIAL/PLATELET
Basophils Absolute: 0 10*3/uL (ref 0.0–0.1)
Basophils Relative: 0 %
EOS ABS: 0.3 10*3/uL (ref 0.0–0.7)
Eosinophils Relative: 4 %
HEMATOCRIT: 32.5 % — AB (ref 36.0–46.0)
HEMOGLOBIN: 9.8 g/dL — AB (ref 12.0–15.0)
LYMPHS ABS: 2.1 10*3/uL (ref 0.7–4.0)
Lymphocytes Relative: 24 %
MCH: 27.5 pg (ref 26.0–34.0)
MCHC: 30.2 g/dL (ref 30.0–36.0)
MCV: 91.3 fL (ref 78.0–100.0)
MONOS PCT: 7 %
Monocytes Absolute: 0.6 10*3/uL (ref 0.1–1.0)
NEUTROS ABS: 5.4 10*3/uL (ref 1.7–7.7)
NEUTROS PCT: 65 %
Platelets: 137 10*3/uL — ABNORMAL LOW (ref 150–400)
RBC: 3.56 MIL/uL — ABNORMAL LOW (ref 3.87–5.11)
RDW: 17.4 % — ABNORMAL HIGH (ref 11.5–15.5)
WBC: 8.5 10*3/uL (ref 4.0–10.5)

## 2016-12-19 LAB — BASIC METABOLIC PANEL
Anion gap: 11 (ref 5–15)
BUN: 11 mg/dL (ref 6–20)
CHLORIDE: 108 mmol/L (ref 101–111)
CO2: 20 mmol/L — AB (ref 22–32)
CREATININE: 0.95 mg/dL (ref 0.44–1.00)
Calcium: 8.8 mg/dL — ABNORMAL LOW (ref 8.9–10.3)
GFR calc non Af Amer: >60 mL/min (ref >60)
Glucose, Bld: 103 mg/dL — ABNORMAL HIGH (ref 65–99)
POTASSIUM: 3.7 mmol/L (ref 3.5–5.1)
Sodium: 139 mmol/L (ref 135–145)

## 2016-12-19 LAB — HCG, SERUM, QUALITATIVE: Preg, Serum: NEGATIVE

## 2016-12-22 NOTE — Anesthesia Preprocedure Evaluation (Addendum)
Anesthesia Evaluation  Patient identified by MRN, date of birth, ID band Patient awake    Reviewed: Allergy & Precautions, NPO status , Patient's Chart, lab work & pertinent test results  Airway Mallampati: III  TM Distance: >3 FB Neck ROM: Full    Dental  (+) Teeth Intact   Pulmonary former smoker,    breath sounds clear to auscultation       Cardiovascular hypertension,  Rhythm:Regular Rate:Normal     Neuro/Psych  Headaches, PSYCHIATRIC DISORDERS Anxiety Depression    GI/Hepatic GERD  ,(+) Cirrhosis   Esophageal Varices and ascites    , Hepatitis -  Endo/Other  Hypothyroidism Morbid obesity  Renal/GU      Musculoskeletal   Abdominal   Peds  Hematology  (+) anemia ,   Anesthesia Other Findings   Reproductive/Obstetrics                            Anesthesia Physical Anesthesia Plan  ASA: III  Anesthesia Plan: MAC   Post-op Pain Management:    Induction: Intravenous  PONV Risk Score and Plan:   Airway Management Planned: Simple Face Mask  Additional Equipment:   Intra-op Plan:   Post-operative Plan:   Informed Consent: I have reviewed the patients History and Physical, chart, labs and discussed the procedure including the risks, benefits and alternatives for the proposed anesthesia with the patient or authorized representative who has indicated his/her understanding and acceptance.     Plan Discussed with:   Anesthesia Plan Comments:         Anesthesia Quick Evaluation                                   Anesthesia Evaluation  Patient identified by MRN, date of birth, ID band Patient awake    Reviewed: Allergy & Precautions, NPO status , Patient's Chart, lab work & pertinent test results  Airway Mallampati: III  TM Distance: >3 FB     Dental  (+) Teeth Intact   Pulmonary shortness of breath, pneumonia, resolved, former smoker,    breath sounds  clear to auscultation       Cardiovascular hypertension, Pt. on medications  Rhythm:Regular Rate:Normal     Neuro/Psych  Headaches, PSYCHIATRIC DISORDERS Depression    GI/Hepatic (+) Cirrhosis   ascites  substance abuse  alcohol use, Hepatitis -  Endo/Other    Renal/GU Renal disease     Musculoskeletal   Abdominal   Peds  Hematology  (+) anemia ,   Anesthesia Other Findings   Reproductive/Obstetrics                             Anesthesia Physical Anesthesia Plan  ASA: III  Anesthesia Plan: MAC   Post-op Pain Management:    Induction: Intravenous  Airway Management Planned: Simple Face Mask  Additional Equipment:   Intra-op Plan:   Post-operative Plan:   Informed Consent: I have reviewed the patients History and Physical, chart, labs and discussed the procedure including the risks, benefits and alternatives for the proposed anesthesia with the patient or authorized representative who has indicated his/her understanding and acceptance.     Plan Discussed with:   Anesthesia Plan Comments:         Anesthesia Quick Evaluation

## 2016-12-25 ENCOUNTER — Ambulatory Visit (HOSPITAL_COMMUNITY): Payer: Medicaid Other | Admitting: Anesthesiology

## 2016-12-25 ENCOUNTER — Encounter (HOSPITAL_COMMUNITY): Payer: Self-pay | Admitting: Emergency Medicine

## 2016-12-25 ENCOUNTER — Ambulatory Visit (HOSPITAL_COMMUNITY)
Admission: RE | Admit: 2016-12-25 | Discharge: 2016-12-25 | Disposition: A | Discharge status: Home / Self Care | Payer: Medicaid Other | Source: Ambulatory Visit | Attending: Internal Medicine | Admitting: Internal Medicine

## 2016-12-25 ENCOUNTER — Encounter (HOSPITAL_COMMUNITY)
Admission: RE | Disposition: A | Discharge status: Home / Self Care | Payer: Self-pay | Source: Ambulatory Visit | Attending: Internal Medicine

## 2016-12-25 DIAGNOSIS — R1310 Dysphagia, unspecified: Secondary | ICD-10-CM | POA: Diagnosis not present

## 2016-12-25 DIAGNOSIS — I8500 Esophageal varices without bleeding: Secondary | ICD-10-CM | POA: Diagnosis not present

## 2016-12-25 DIAGNOSIS — K766 Portal hypertension: Secondary | ICD-10-CM | POA: Diagnosis not present

## 2016-12-25 DIAGNOSIS — F419 Anxiety disorder, unspecified: Secondary | ICD-10-CM | POA: Insufficient documentation

## 2016-12-25 DIAGNOSIS — I8510 Secondary esophageal varices without bleeding: Secondary | ICD-10-CM | POA: Diagnosis not present

## 2016-12-25 DIAGNOSIS — D649 Anemia, unspecified: Secondary | ICD-10-CM | POA: Insufficient documentation

## 2016-12-25 DIAGNOSIS — I129 Hypertensive chronic kidney disease with stage 1 through stage 4 chronic kidney disease, or unspecified chronic kidney disease: Secondary | ICD-10-CM | POA: Diagnosis not present

## 2016-12-25 DIAGNOSIS — F329 Major depressive disorder, single episode, unspecified: Secondary | ICD-10-CM | POA: Insufficient documentation

## 2016-12-25 DIAGNOSIS — Z79899 Other long term (current) drug therapy: Secondary | ICD-10-CM | POA: Diagnosis not present

## 2016-12-25 DIAGNOSIS — K319 Disease of stomach and duodenum, unspecified: Secondary | ICD-10-CM | POA: Insufficient documentation

## 2016-12-25 DIAGNOSIS — K3189 Other diseases of stomach and duodenum: Secondary | ICD-10-CM | POA: Insufficient documentation

## 2016-12-25 DIAGNOSIS — Z79818 Long term (current) use of other agents affecting estrogen receptors and estrogen levels: Secondary | ICD-10-CM | POA: Diagnosis not present

## 2016-12-25 DIAGNOSIS — N189 Chronic kidney disease, unspecified: Secondary | ICD-10-CM | POA: Insufficient documentation

## 2016-12-25 DIAGNOSIS — Z87891 Personal history of nicotine dependence: Secondary | ICD-10-CM | POA: Diagnosis not present

## 2016-12-25 DIAGNOSIS — K7030 Alcoholic cirrhosis of liver without ascites: Secondary | ICD-10-CM | POA: Diagnosis not present

## 2016-12-25 DIAGNOSIS — Z6841 Body Mass Index (BMI) 40.0 and over, adult: Secondary | ICD-10-CM | POA: Diagnosis not present

## 2016-12-25 HISTORY — PX: BIOPSY: SHX5522

## 2016-12-25 HISTORY — PX: MALONEY DILATION: SHX5535

## 2016-12-25 HISTORY — PX: ESOPHAGEAL BANDING: SHX5518

## 2016-12-25 HISTORY — PX: ESOPHAGOGASTRODUODENOSCOPY (EGD) WITH PROPOFOL: SHX5813

## 2016-12-25 SURGERY — ESOPHAGOGASTRODUODENOSCOPY (EGD) WITH PROPOFOL
Anesthesia: Monitor Anesthesia Care

## 2016-12-25 MED ORDER — PROPOFOL 500 MG/50ML IV EMUL
INTRAVENOUS | Status: DC | PRN
  Administered 2016-12-25: 150 ug/kg/min via INTRAVENOUS

## 2016-12-25 MED ORDER — FENTANYL CITRATE (PF) 100 MCG/2ML IJ SOLN
INTRAMUSCULAR | Status: AC
  Filled 2016-12-25: qty 2

## 2016-12-25 MED ORDER — MIDAZOLAM HCL 2 MG/2ML IJ SOLN
1.0000 mg | INTRAMUSCULAR | Status: AC
  Administered 2016-12-25: 2 mg via INTRAVENOUS

## 2016-12-25 MED ORDER — FENTANYL CITRATE (PF) 100 MCG/2ML IJ SOLN
25.0000 ug | Freq: Once | INTRAMUSCULAR | Status: AC
  Administered 2016-12-25: 25 ug via INTRAVENOUS

## 2016-12-25 MED ORDER — LIDOCAINE VISCOUS 2 % MT SOLN
OROMUCOSAL | Status: AC
  Filled 2016-12-25: qty 15

## 2016-12-25 MED ORDER — PROPOFOL 10 MG/ML IV BOLUS
INTRAVENOUS | Status: DC | PRN
  Administered 2016-12-25 (×2): 20 mg via INTRAVENOUS
  Administered 2016-12-25: 10 mg via INTRAVENOUS

## 2016-12-25 MED ORDER — LIDOCAINE HCL (PF) 1 % IJ SOLN
INTRAMUSCULAR | Status: AC
  Filled 2016-12-25: qty 5

## 2016-12-25 MED ORDER — MINERAL OIL PO OIL
TOPICAL_OIL | ORAL | Status: AC
  Filled 2016-12-25: qty 60

## 2016-12-25 MED ORDER — SODIUM CHLORIDE 0.9 % IJ SOLN
INTRAMUSCULAR | Status: AC
  Filled 2016-12-25: qty 10

## 2016-12-25 MED ORDER — MIDAZOLAM HCL 2 MG/2ML IJ SOLN
INTRAMUSCULAR | Status: AC
  Filled 2016-12-25: qty 2

## 2016-12-25 MED ORDER — LIDOCAINE VISCOUS 2 % MT SOLN
15.0000 mL | Freq: Once | OROMUCOSAL | Status: AC
  Administered 2016-12-25: 3 mL via OROMUCOSAL

## 2016-12-25 MED ORDER — PROPOFOL 10 MG/ML IV BOLUS
INTRAVENOUS | Status: AC
  Filled 2016-12-25: qty 40

## 2016-12-25 MED ORDER — LACTATED RINGERS IV SOLN
INTRAVENOUS | Status: DC
  Administered 2016-12-25: 08:00:00 via INTRAVENOUS

## 2016-12-25 NOTE — Op Note (Signed)
[facility] Patient Name: Whitney Bush Procedure Date: 12/25/2016 8:18 AM MRN: [id number] Date of Birth: [birth year] Attending MD: Norvel Richards , MD CSN: [id number] Age: 46 Admit Type: Outpatient Procedure:                Upper GI endoscopy Indications:              Dysphagia Providers:                Norvel Richards, MD, Selena Lesser, Lurline Del, RN, Randa Spike, Technician Referring MD:             Lysle Morales Medicines:                Propofol per Anesthesia Complications:            No immediate complications. Estimated Blood Loss:     Estimated blood loss was minimal. Procedure:                Pre-Anesthesia Assessment:                           - Prior to the procedure, a History and Physical                            was performed, and patient medications and                            allergies were reviewed. The patient's tolerance of                            previous anesthesia was also reviewed. The risks                            and benefits of the procedure and the sedation                            options and risks were discussed with the patient.                            All questions were answered, and informed consent                            was obtained. Prior Anticoagulants: The patient has                            taken no previous anticoagulant or antiplatelet                            agents. ASA Grade Assessment: III - A patient with                            severe systemic disease. After reviewing the risks  and benefits, the patient was deemed in                            satisfactory condition to undergo the procedure.                           After obtaining informed consent, the endoscope was                            passed under direct vision. Throughout the                            procedure, the patient's blood pressure, pulse, and                             oxygen saturations were monitored continuously. The                            EG-[id number] ([id number]) scope was introduced through the                            and advanced to the second part of duodenum. The                            upper GI endoscopy was accomplished without                            difficulty. The patient tolerated the procedure                            well. Scope In: 8:52:10 AM Scope Out: 8:59:42 AM Total Procedure Duration: 0 hours 7 minutes 32 seconds  Findings:      Varices were found in the esophagus. 2 columns grade 2; one column grade       3. Tubular esophagus patent throughout its course. Scope withdrawn, a 56       French Maloney dilator was passed to full insertion easily. A look back       revealed no apparent complication to this maneuver. No bleeding.      Mild portal hypertensive gastropathy was found in the entire examined       stomach.      Multiple erosions were found in the gastric antrum. This was biopsied       with a cold forceps for histology. Estimated blood loss was minimal. Impression:               status post Maloney dilation                           - Esophageal varices.                           - Portal hypertensive gastropathy.                           - Erosive gastropathy. Biopsied. Moderate Sedation:      Moderate (conscious) sedation was personally  administered by an       anesthesia professional. The following parameters were monitored: oxygen       saturation, heart rate, blood pressure, respiratory rate, EKG, adequacy       of pulmonary ventilation, and response to care. Total physician       intraservice time was 12 minutes. Recommendation:           - Patient has a contact number available for                            emergencies. The signs and symptoms of potential                            delayed complications were discussed with the                            patient. Return to normal activities tomorrow.                             Written discharge instructions were provided to the                            patient.                           - Resume previous diet.                           - Continue present medications including Inderal.                           - Await pathology results.                           - No repeat upper endoscopy.                           - Return to GI office in 3 months. Procedure Code(s):        --- Professional ---                           [phone], Esophagogastroduodenoscopy, flexible,                            transoral; with biopsy, single or multiple Diagnosis Code(s):        --- Professional ---                           I85.00, Esophageal varices without bleeding                           K76.6, Portal hypertension                           K31.89, Other diseases of stomach and duodenum  R13.10, Dysphagia, unspecified CPT copyright 2016 [employer] Association. All rights reserved. The codes documented in this report are preliminary and upon coder review may  be revised to meet current compliance requirements. Cristopher Estimable. [name], MD Norvel Richards, MD 12/25/2016 9:17:31 AM This report has been signed electronically. Number of Addenda: 0

## 2016-12-25 NOTE — H&P (View-Only) (Signed)
No hepatoma. Repeat ruq u/s in 6 months.

## 2016-12-25 NOTE — Interval H&P Note (Signed)
History and Physical Interval Note:  12/25/2016 8:32 AM  Whitney Bush  has presented today for surgery, with the diagnosis of dysphagia, esophageal varices, cirrhosis  The various methods of treatment have been discussed with the patient and family. After consideration of risks, benefits and other options for treatment, the patient has consented to  Procedure(s) with comments: ESOPHAGOGASTRODUODENOSCOPY (EGD) WITH PROPOFOL (N/A) - 8:45am MALONEY DILATION (N/A) ESOPHAGEAL BANDING with Propofol (N/A) - possible banding of varices as a surgical intervention .  The patient's history has been reviewed, patient examined, no change in status, stable for surgery.  I have reviewed the patient's chart and labs.  Questions were answered to the patient's satisfaction.     [name]  No change. EGD today for to further evaluate dysphagia and follow-up on varices. She may or may not undergo esophageal dilation today. She may or may not undergo EVL. The risks, benefits, limitations, alternatives and imponderables have been reviewed with the patient. Potential for esophageal dilation, biopsy, etc. have also been reviewed.  Questions have been answered. All parties agreeable.

## 2016-12-25 NOTE — Anesthesia Postprocedure Evaluation (Signed)
Anesthesia Post Note  Patient: Whitney Bush  Procedure(s) Performed: ESOPHAGOGASTRODUODENOSCOPY (EGD) WITH PROPOFOL (N/A ) MALONEY DILATION (N/A ) ESOPHAGEAL BANDING with Propofol (N/A ) BIOPSY  Patient location during evaluation: PACU Anesthesia Type: MAC Level of consciousness: awake and alert and oriented Pain management: pain level controlled Vital Signs Assessment: post-procedure vital signs reviewed and stable Respiratory status: spontaneous breathing Cardiovascular status: blood pressure returned to baseline and stable Postop Assessment: no apparent nausea or vomiting Anesthetic complications: no     Last Vitals:  Vitals:   12/25/16 0920 12/25/16 0947  BP: (!) 110/56 117/82  Pulse: 86 83  Resp: 16   Temp:  36.8 C  SpO2: 100% 98%    Last Pain:  Vitals:   12/25/16 0947  TempSrc: Oral  PainSc:                  [name]

## 2016-12-25 NOTE — Transfer of Care (Signed)
Immediate Anesthesia Transfer of Care Note  Patient: Whitney Bush  Procedure(s) Performed: ESOPHAGOGASTRODUODENOSCOPY (EGD) WITH PROPOFOL (N/A ) MALONEY DILATION (N/A ) ESOPHAGEAL BANDING with Propofol (N/A ) BIOPSY  Patient Location: PACU  Anesthesia Type:MAC  Level of Consciousness: awake  Airway & Oxygen Therapy: Patient Spontanous Breathing  Post-op Assessment: Report given to RN  Post vital signs: Reviewed and stable  Last Vitals:  Vitals:   12/25/16 0825 12/25/16 0830  BP: 113/76 118/76  Pulse:    Resp: 17 15  Temp:    SpO2: 100% 100%    Last Pain:  Vitals:   12/25/16 0721  TempSrc: Oral  PainSc: 9          Complications: No apparent anesthesia complications

## 2016-12-25 NOTE — Discharge Instructions (Signed)
EGD °Discharge instructions °Please read the instructions outlined below and refer to this sheet in the next few weeks. These discharge instructions provide you with general information on caring for yourself after you leave the hospital. Your doctor may also give you specific instructions. While your treatment has been planned according to the most current medical practices available, unavoidable complications occasionally occur. If you have any problems or questions after discharge, please call your doctor. °ACTIVITY °· You may resume your regular activity but move at a slower pace for the next 24 hours.  °· Take frequent rest periods for the next 24 hours.  °· Walking will help expel (get rid of) the air and reduce the bloated feeling in your abdomen.  °· No driving for 24 hours (because of the anesthesia (medicine) used during the test).  °· You may shower.  °· Do not sign any important legal documents or operate any machinery for 24 hours (because of the anesthesia used during the test).  °NUTRITION °· Drink plenty of fluids.  °· You may resume your normal diet.  °· Begin with a light meal and progress to your normal diet.  °· Avoid alcoholic beverages for 24 hours or as instructed by your caregiver.  °MEDICATIONS °· You may resume your normal medications unless your caregiver tells you otherwise.  °WHAT YOU CAN EXPECT TODAY °· You may experience abdominal discomfort such as a feeling of fullness or “gas” pains.  °FOLLOW-UP °· Your doctor will discuss the results of your test with you.  °SEEK IMMEDIATE MEDICAL ATTENTION IF ANY OF THE FOLLOWING OCCUR: °· Excessive nausea (feeling sick to your stomach) and/or vomiting.  °· Severe abdominal pain and distention (swelling).  °· Trouble swallowing.  °· Temperature over 101° F (37.8º C).  °· Rectal bleeding or vomiting of blood.  ° ° °Office visit with us in 3 months ° °Further recommendations to follow pending review of pathology report °

## 2016-12-27 ENCOUNTER — Encounter (HOSPITAL_COMMUNITY): Payer: Self-pay | Admitting: Internal Medicine

## 2016-12-28 ENCOUNTER — Encounter: Payer: Self-pay | Admitting: Internal Medicine

## 2017-01-10 ENCOUNTER — Telehealth: Payer: Self-pay | Admitting: *Deleted

## 2017-01-10 ENCOUNTER — Other Ambulatory Visit: Payer: Self-pay | Admitting: Obstetrics and Gynecology

## 2017-01-10 MED ORDER — MEGESTROL ACETATE 40 MG PO TABS: 40.0000 mg | ORAL_TABLET | Freq: Three times a day (TID) | ORAL | 2 refills | Status: DC

## 2017-01-10 NOTE — Progress Notes (Signed)
Rx 'd  Megace to control heavy menses.

## 2017-01-10 NOTE — Telephone Encounter (Signed)
Spoke with pt. Pt started period 11/7. She is having heavy bleeding. She is supposed to go out of town Tuesday. I spoke with Dr. Glo Herring and he prescribed Megace 40 mg TID. Pt voiced understanding. [facility] [place]

## 2017-01-10 NOTE — Telephone Encounter (Signed)
Left message x 1. JSY 

## 2017-02-01 ENCOUNTER — Ambulatory Visit: Payer: Medicaid Other | Admitting: Obstetrics and Gynecology

## 2017-02-12 ENCOUNTER — Ambulatory Visit (INDEPENDENT_AMBULATORY_CARE_PROVIDER_SITE_OTHER): Payer: Medicaid Other | Admitting: Obstetrics and Gynecology

## 2017-02-12 ENCOUNTER — Encounter: Payer: Self-pay | Admitting: Obstetrics and Gynecology

## 2017-02-12 VITALS — BP 110/80 | HR 94 | Wt 278.3 lb

## 2017-02-12 DIAGNOSIS — N939 Abnormal uterine and vaginal bleeding, unspecified: Secondary | ICD-10-CM | POA: Insufficient documentation

## 2017-02-12 MED ORDER — MEGESTROL ACETATE 40 MG PO TABS: 40.0000 mg | ORAL_TABLET | Freq: Three times a day (TID) | ORAL | 5 refills | Status: DC

## 2017-02-12 NOTE — Progress Notes (Signed)
Patient ID: Whitney Bush, female   DOB: [birth year], 46 y.o.   MRN: [id number]   [facility] Visit  @DATE @            Patient name: Whitney Bush MRN [id number]  Date of birth: 02-04-1971  CC & HPI:  [name] is a 46 y.o. female presenting today for a follow-up of her AUB that started a few months ago. The patient has been taking Megace as prescribed. She did add that she ran out of the medication, but was able to get it refilled. The patient states that her liver is fine. The bleeding has improved and she is able to control it by taking Megace x3/day. She denies any other symptoms or complaints at this time.   ROS:  ROS +AUB All systems are negative except as noted in the HPI and PMH.   Pertinent History Reviewed:   Reviewed: AUB Medical         Past Medical History:  Diagnosis Date  . Alcohol abuse   . 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 Anemia   . Blood transfusion without reported diagnosis   . Boil 06/04/2015  . Chronic kidney disease   . Depression   . GERD (gastroesophageal reflux disease)   . GI (gastrointestinal bleed) 07/28/2015  . Heart murmur   . Hyperlipidemia   . Hypertension   . Hypothyroidism   . Migraines   . OCD (obsessive compulsive disorder)   . Peripheral edema   . PTSD (post-traumatic stress disorder)   . Tubular adenoma                               Surgical Hx:    Past Surgical History:  Procedure Laterality Date  . ABCESS DRAINAGE     x5; neck, arm, chest, back  . BIOPSY  02/24/2015   Procedure: BIOPSY;  Surgeon: Danie Binder, MD;  Location: [facility];  Service: Endoscopy;;  gastric bx's  . BIOPSY  12/25/2016   Procedure: BIOPSY;  Surgeon: Daneil Dolin, MD;  Location: [facility];  Service: Endoscopy;;  gastric  . CESAREAN SECTION    . COLONOSCOPY WITH PROPOFOL N/A 06/28/2015   Dr.Rourk- inadequate prep- One 6 mm polyp at the splenic flexure bx= tubular adenoma  . ESOPHAGEAL  BANDING N/A 12/25/2016   Procedure: ESOPHAGEAL BANDING with Propofol;  Surgeon: Daneil Dolin, MD;  Location: [facility];  Service: Endoscopy;  Laterality: N/A;  possible banding of varices  . ESOPHAGOGASTRODUODENOSCOPY (EGD) WITH PROPOFOL N/A 02/24/2015   SLF: Grade II esophageal varices Moderate portal hypertensive gastropathy Mild erosive gastritis anemia likely due to many factors: gastritis, gastropathy, coagulopathy, chronic disease  . ESOPHAGOGASTRODUODENOSCOPY (EGD) WITH PROPOFOL N/A 12/25/2016   Procedure: ESOPHAGOGASTRODUODENOSCOPY (EGD) WITH PROPOFOL;  Surgeon: Daneil Dolin, MD;  Location: [facility];  Service: Endoscopy;  Laterality: N/A;  8:45am  . MALONEY DILATION N/A 12/25/2016   Procedure: Venia Minks DILATION;  Surgeon: Daneil Dolin, MD;  Location: [facility];  Service: Endoscopy;  Laterality: N/A;  . POLYPECTOMY  06/28/2015   Procedure: POLYPECTOMY;  Surgeon: Daneil Dolin, MD;  Location: [facility];  Service: Endoscopy;;  at splenic flexure   Medications: Reviewed & Updated - see associated section  Current Outpatient Medications:  .  acetaminophen (TYLENOL) 325 MG tablet, Take 650 mg by mouth every 6 (six) hours as needed for mild pain or moderate pain., Disp: , Rfl:  .  B Complex-C (SUPER B COMPLEX PO), Take 1 tablet by mouth daily. , Disp: , Rfl:  .  Cholecalciferol (VITAMIN D PO), Take 1 tablet by mouth daily., Disp: , Rfl:  .  Ferrous Sulfate (IRON) 325 (65 Fe) MG TABS, Take 1 tablet by mouth daily., Disp: , Rfl:  .  FLUoxetine (PROZAC) 20 MG tablet, Take 1 tablet (20 mg total) by mouth daily., Disp: 30 tablet, Rfl: 3 .  furosemide (LASIX) 40 MG tablet, Take 40 mg by mouth daily., Disp: , Rfl:  .  lactulose (CHRONULAC) 10 GM/15ML solution, Take 15 mLs (10 g total) by mouth 2 (two) times daily., Disp: 946 mL, Rfl: 3 .  megestrol (MEGACE) 40 MG tablet, Take 1 tablet (40 mg total) 3 (three) times daily by mouth. May begin day 4 of period,  Disp: 45 tablet, Rfl: 2 .  prazosin (MINIPRESS) 1 MG capsule, Take 1 capsule (1 mg total) by mouth 2 (two) times daily., Disp: 60 capsule, Rfl: 1 .  propranolol (INDERAL) 10 MG tablet, Take 1 tablet (10 mg total) by mouth 3 (three) times daily., Disp: 90 tablet, Rfl: 1 .  spironolactone (ALDACTONE) 50 MG tablet, Take 1 tablet (50 mg total) by mouth daily., Disp: 30 tablet, Rfl: 3 .  traZODone (DESYREL) 100 MG tablet, Take 1 tablet (100 mg total) by mouth at bedtime., Disp: 30 tablet, Rfl: 1 .  vitamin E 400 UNIT capsule, Take 400 Units by mouth daily., Disp: , Rfl:    Social History: Reviewed -  reports that she quit smoking about 6 years ago. Her smoking use included cigarettes. She has a 5.00 pack-year smoking history. She has quit using smokeless tobacco. Her smokeless tobacco use included snuff.  Objective Findings:  Vitals: Weight 278 lb 4.8 oz (126.2 kg).  PHYSICAL EXAMINATION General appearance - alert, well appearing, and in no distress, oriented to person, place, and time and overweight Mental status - alert, oriented to person, place, and time, normal mood, behavior, speech, dress, motor activity, and thought processes, affect appropriate to mood  PELVIC Deferred  Assessment & Plan:   A:  1. AUB, controlled with Megace 3x/ day will refill.  P:  1. F/U 6 months or PRN    By signing my name below, I, [name], attest that this documentation has been prepared under the direction and in the presence of Jonnie Kind, MD. Electronically Signed: [name], Medical Scribe. 02/12/17. 1:56 PM.  I personally performed the services described in this documentation, which was SCRIBED in my presence. The recorded information has been reviewed and considered accurate. It has been edited as necessary during review. Jonnie Kind, MD

## 2017-02-24 IMAGING — DX DG CHEST 2V
2 series · 2 of 2 positions shown · non-contrast
Comparison: 12/10/2014

CLINICAL DATA: Shortness of breath and nonproductive cough for 3
days

EXAM:
CHEST - 2 VIEW

[chest pa]
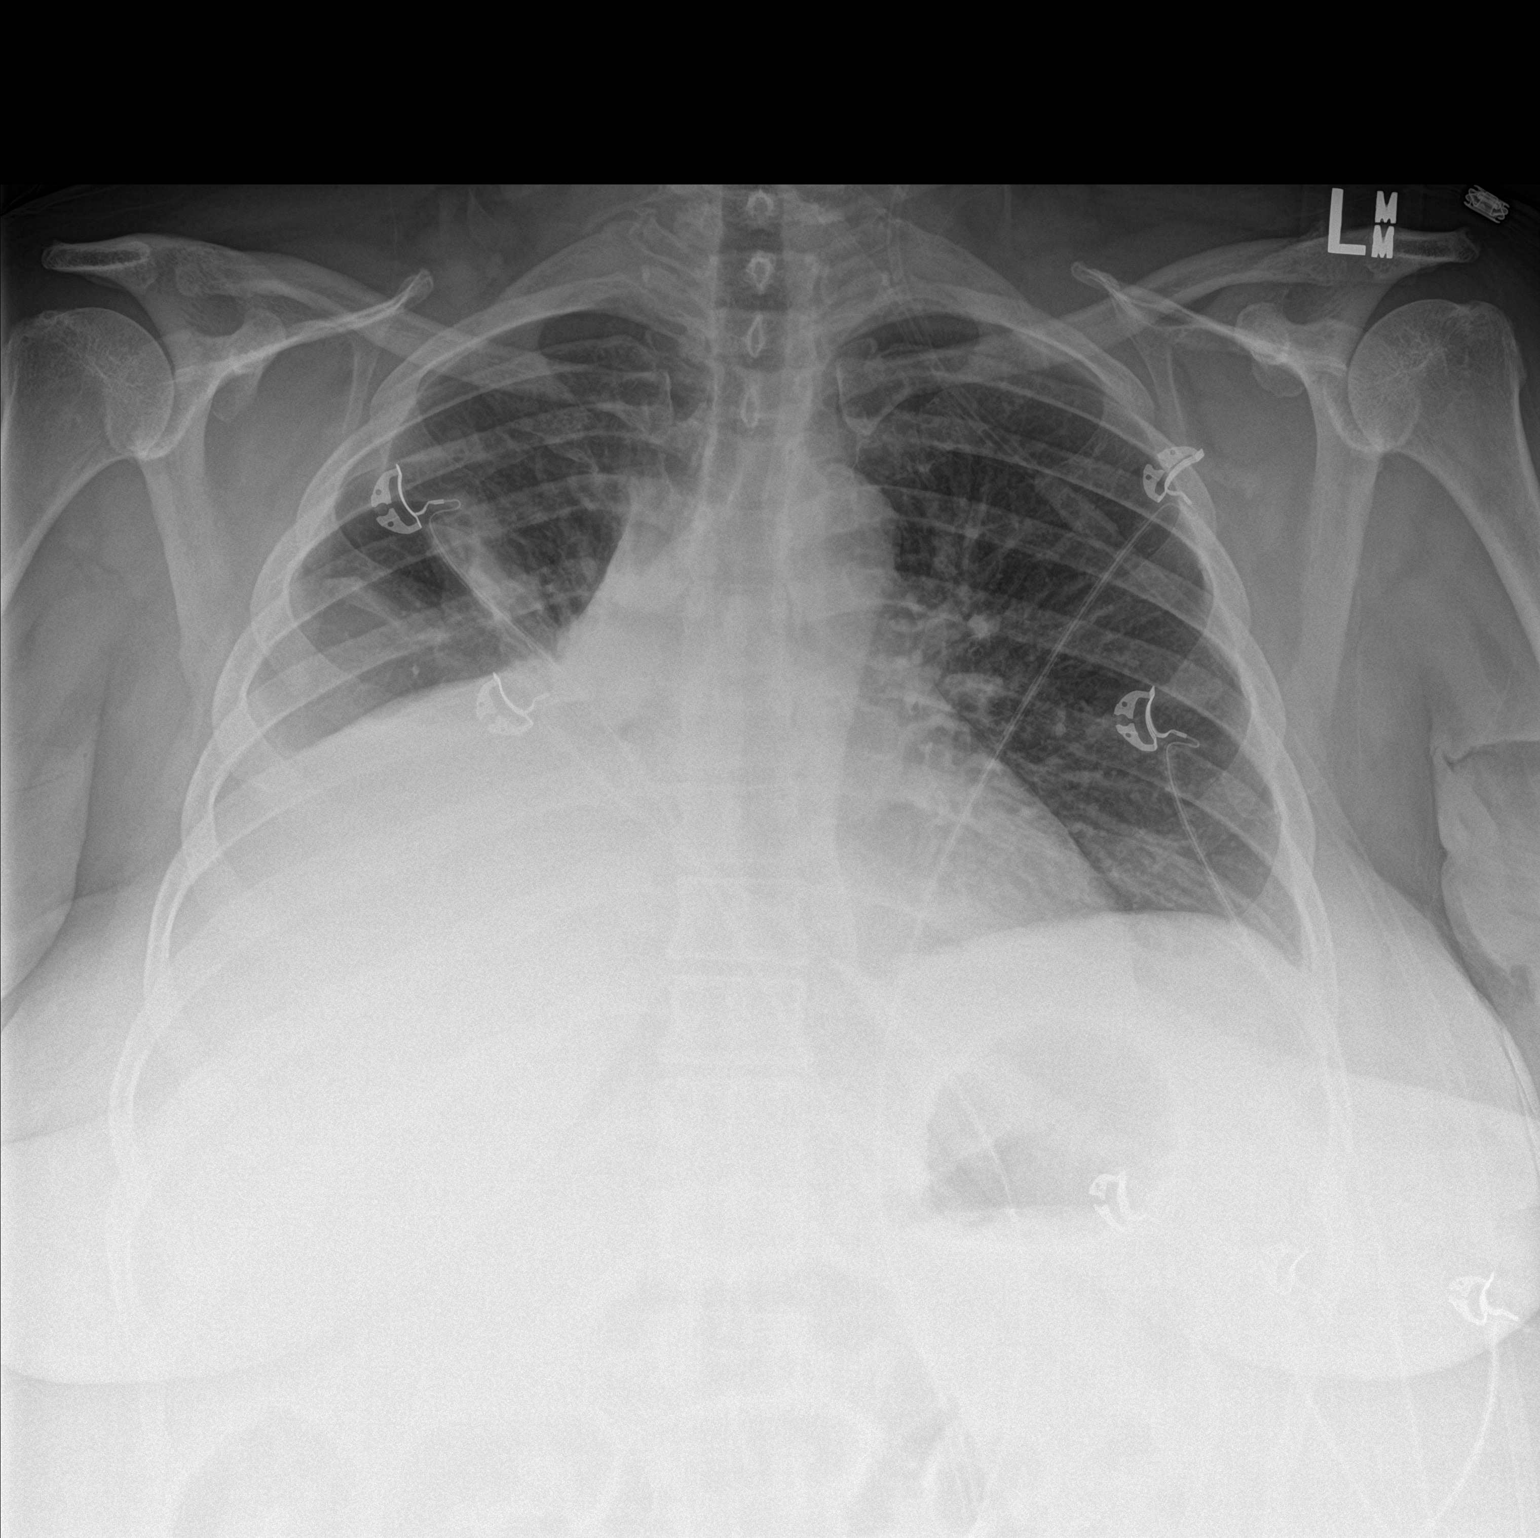

[chest lat]
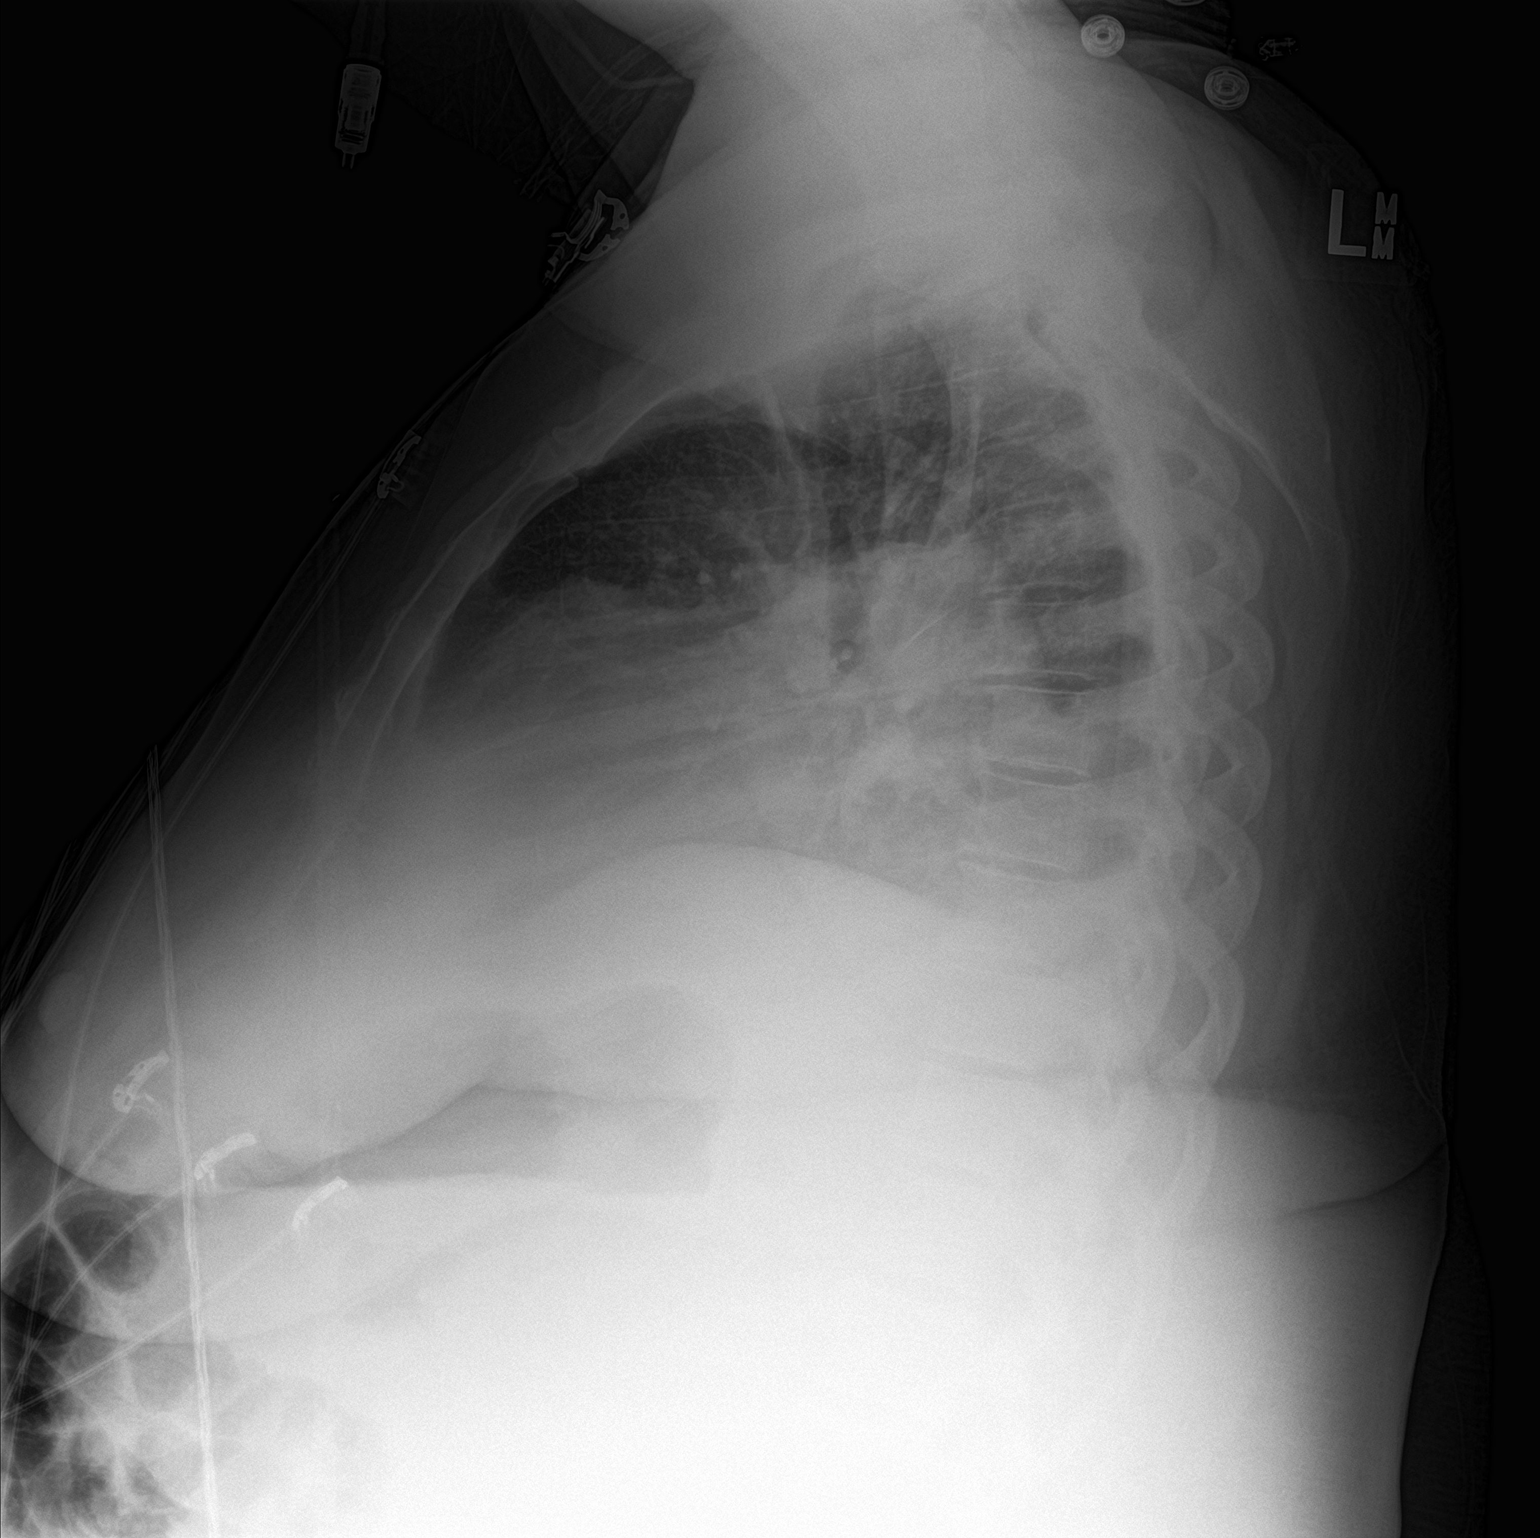

[2 of 2 positions shown; findings below may reference images not displayed]

FINDINGS: Cardiac shadow is stable. Elevation the right hemidiaphragm is noted
with consolidation in the right middle and right lower lobe. No bony
abnormality is noted.
IMPRESSION: Consolidation in the right middle and right lower lobes. Followup PA
and lateral chest X-ray is recommended in 3-4 weeks following trial
of antibiotic therapy to ensure resolution and exclude underlying
malignancy.

## 2017-04-02 ENCOUNTER — Ambulatory Visit: Payer: Medicaid Other | Admitting: Gastroenterology

## 2017-04-16 ENCOUNTER — Ambulatory Visit (INDEPENDENT_AMBULATORY_CARE_PROVIDER_SITE_OTHER): Payer: Medicaid Other | Admitting: Obstetrics and Gynecology

## 2017-04-16 ENCOUNTER — Encounter (INDEPENDENT_AMBULATORY_CARE_PROVIDER_SITE_OTHER): Payer: Self-pay

## 2017-04-16 ENCOUNTER — Encounter: Payer: Self-pay | Admitting: Obstetrics and Gynecology

## 2017-04-16 VITALS — BP 138/80 | HR 89 | Ht 63.0 in | Wt 281.2 lb

## 2017-04-16 DIAGNOSIS — D259 Leiomyoma of uterus, unspecified: Secondary | ICD-10-CM | POA: Diagnosis not present

## 2017-04-16 DIAGNOSIS — R635 Abnormal weight gain: Secondary | ICD-10-CM | POA: Diagnosis not present

## 2017-04-16 DIAGNOSIS — N939 Abnormal uterine and vaginal bleeding, unspecified: Secondary | ICD-10-CM

## 2017-04-16 DIAGNOSIS — G4730 Sleep apnea, unspecified: Secondary | ICD-10-CM | POA: Diagnosis not present

## 2017-04-16 DIAGNOSIS — R42 Dizziness and giddiness: Secondary | ICD-10-CM

## 2017-04-16 LAB — POCT HEMOGLOBIN: Hemoglobin: 10.5 g/dL — AB (ref 12.2–16.2)

## 2017-04-16 NOTE — Progress Notes (Signed)
[facility] Visit  04/16/2017            Patient name: Whitney Bush MRN [id number]  Date of birth: [birth year]  CC & HPI:  Whitney Bush is a 47 y.o. female presenting today for continued AUB. She states the bleeding is not as heavy as it was before. She still has clots that pass when she uses the bathroom. She has bled every day in February. The bleeding stopping in Jan for 1 week, but began again. She has associated symptoms of dizziness. She states she gets lightheaded when she is driving. She falls asleep a lot during her daily activities. Her PCP has said she needs a sleep study done. She is taking Megace as prescribed. No alleviating factors noted. She was last seen in office on 02/12/17 for AUB that she was managing by taking Megace. She has not checked her hemoglobin recently.  Her weight has been increasing. She has a hx a thickened endometrium and has hx of uterine fibroids. She is not sexually active.  ROS:  ROS  (+) vaginal bleeding/AUB (+) dizziness/lightheadedness (-) pain All systems are negative except as noted in the HPI and PMH.   Pertinent History Reviewed:   Reviewed: Significant for uterine fibroids Medical         Past Medical History:  Diagnosis Date  . Alcohol abuse   . 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 Anemia   . Blood transfusion without reported diagnosis   . Boil 06/04/2015  . Chronic kidney disease   . Depression   . GERD (gastroesophageal reflux disease)   . GI (gastrointestinal bleed) 07/28/2015  . Heart murmur   . Hyperlipidemia   . Hypertension   . Hypothyroidism   . Migraines   . OCD (obsessive compulsive disorder)   . Peripheral edema   . PTSD (post-traumatic stress disorder)   . Tubular adenoma                               Surgical Hx:    Past Surgical History:  Procedure Laterality Date  . ABCESS DRAINAGE     x5; neck, arm, chest, back  . BIOPSY  02/24/2015   Procedure: BIOPSY;   Surgeon: Danie Binder, MD;  [facility];  Service: Endoscopy;;  gastric bx's  . BIOPSY  12/25/2016   Procedure: BIOPSY;  Surgeon: Daneil Dolin, MD;  [facility];  Service: Endoscopy;;  gastric  . CESAREAN SECTION    . COLONOSCOPY WITH PROPOFOL N/A 06/28/2015   Dr.Rourk- inadequate prep- One 6 mm polyp at the splenic flexure bx= tubular adenoma  . ESOPHAGEAL BANDING N/A 12/25/2016   Procedure: ESOPHAGEAL BANDING with Propofol;  Surgeon: Daneil Dolin, MD;  [facility];  Service: Endoscopy;  Laterality: N/A;  possible banding of varices  . ESOPHAGOGASTRODUODENOSCOPY (EGD) WITH PROPOFOL N/A 02/24/2015   SLF: Grade II esophageal varices Moderate portal hypertensive gastropathy Mild erosive gastritis anemia likely due to many factors: gastritis, gastropathy, coagulopathy, chronic disease  . ESOPHAGOGASTRODUODENOSCOPY (EGD) WITH PROPOFOL N/A 12/25/2016   Procedure: ESOPHAGOGASTRODUODENOSCOPY (EGD) WITH PROPOFOL;  Surgeon: Daneil Dolin, MD;  [facility];  Service: Endoscopy;  Laterality: N/A;  8:45am  . MALONEY DILATION N/A 12/25/2016   Procedure: Venia Minks DILATION;  Surgeon: Daneil Dolin, MD;  [facility];  Service:  Endoscopy;  Laterality: N/A;  . POLYPECTOMY  06/28/2015   Procedure: POLYPECTOMY;  Surgeon: Daneil Dolin, MD;  [facility];  Service: Endoscopy;;  at splenic flexure   Medications: Reviewed & Updated - see associated section                       Current Outpatient Medications:  .  Cholecalciferol (VITAMIN D PO), Take 1 tablet by mouth daily., Disp: , Rfl:  .  Ferrous Sulfate (IRON) 325 (65 Fe) MG TABS, Take 1 tablet by mouth daily., Disp: , Rfl:  .  furosemide (LASIX) 40 MG tablet, Take 40 mg by mouth daily., Disp: , Rfl:  .  hydrochlorothiazide (HYDRODIURIL) 25 MG tablet, Take 25 mg by mouth daily., Disp: , Rfl:  .  megestrol (MEGACE) 40 MG tablet, Take 1 tablet (40 mg total) by mouth 3 (three) times  daily. May begin day 4 of period, Disp: 45 tablet, Rfl: 5 .  prazosin (MINIPRESS) 1 MG capsule, Take 1 capsule (1 mg total) by mouth 2 (two) times daily., Disp: 60 capsule, Rfl: 1 .  propranolol (INDERAL) 10 MG tablet, Take 1 tablet (10 mg total) by mouth 3 (three) times daily., Disp: 90 tablet, Rfl: 1 .  spironolactone (ALDACTONE) 50 MG tablet, Take 1 tablet (50 mg total) by mouth daily., Disp: 30 tablet, Rfl: 3 .  traZODone (DESYREL) 100 MG tablet, Take 1 tablet (100 mg total) by mouth at bedtime., Disp: 30 tablet, Rfl: 1 .  acetaminophen (TYLENOL) 325 MG tablet, Take 650 mg by mouth every 6 (six) hours as needed for mild pain or moderate pain., Disp: , Rfl:  .  B Complex-C (SUPER B COMPLEX PO), Take 1 tablet by mouth daily. , Disp: , Rfl:  .  FLUoxetine (PROZAC) 20 MG tablet, Take 1 tablet (20 mg total) by mouth daily. (Patient not taking: Reported on 04/16/2017), Disp: 30 tablet, Rfl: 3 .  lactulose (CHRONULAC) 10 GM/15ML solution, Take 15 mLs (10 g total) by mouth 2 (two) times daily. (Patient not taking: Reported on 04/16/2017), Disp: 946 mL, Rfl: 3 .  vitamin E 400 UNIT capsule, Take 400 Units by mouth daily., Disp: , Rfl:    Social History: Reviewed -  reports that she quit smoking about 6 years ago. Her smoking use included cigarettes. She has a 5.00 pack-year smoking history. She has quit using smokeless tobacco. Her smokeless tobacco use included snuff.  Objective Findings:  Vitals: Blood pressure 138/80, pulse 89, height 5\' 3"  (1.6 m), weight 281 lb 3.2 oz (127.6 kg).  PHYSICAL EXAMINATION General appearance - alert, well appearing, and in no distress, oriented to person, place, and time and overweight Mental status - alert, oriented to person, place, and time, normal mood, behavior, speech, dress, motor activity, and thought processes Chest -  Heart -  Abdomen -  Breasts -  Skin -   PELVIC Not indicated  Discussion: 1. Discussed with pt the history of her AUB and how she has  been managing it since her previous visit. History of fibroid uterus and how it may be contributing to her issues also discussed. Option of shrinking fibroids also discussed. Diagram of fibroids shown to pt. Her previous CT scan was also shown to her and discussed.  The uterus  does appear larger as large on the CT as was measured on the ultrasound.  After comparison of both studies it may be that the ultrasound overestimates uterine size.  We will repeat  pelvic exam when she returns in a couple of weeks and see if the uterus size might allow the patient to be a candidate for IUD placement  2.  Weight loss advised to patient  At end of discussion, pt had opportunity to ask questions and has no further questions at this time.   Specific discussion as noted above. Greater than 50% was spent in counseling and coordination of care with the patient.   Total time greater than: 25 minutes.    Assessment & Plan:   A:  1. Morbid Obesity 2. Clinical sleep apnea 3. Uterine fibroids, uterus appears smaller on CT than reported on U/S 4. Breakthrough bleeding  P:  1. Hemoglobin checked, 10.5 2. Stop Megace and have a full menstrual cycle, consider IUD placement in 2 weeks 3. F/u in 2 weeks for gyn f/u   By signing my name below, I, Izna Ahmed, attest that this documentation has been prepared under the direction and in the presence of Jonnie Kind, MD. Electronically Signed: Jabier Gauss, Medical Scribe. 04/16/17. 11:50 AM.  I personally performed the services described in this documentation, which was SCRIBED in my presence. The recorded information has been reviewed and considered accurate. It has been edited as necessary during review. Jonnie Kind, MD

## 2017-04-30 ENCOUNTER — Ambulatory Visit: Payer: Medicaid Other | Admitting: Obstetrics and Gynecology

## 2017-05-01 ENCOUNTER — Telehealth: Payer: Self-pay | Admitting: Internal Medicine

## 2017-05-01 ENCOUNTER — Encounter: Payer: Self-pay | Admitting: Obstetrics and Gynecology

## 2017-05-01 ENCOUNTER — Ambulatory Visit (INDEPENDENT_AMBULATORY_CARE_PROVIDER_SITE_OTHER): Payer: Medicaid Other | Admitting: Obstetrics and Gynecology

## 2017-05-01 VITALS — BP 190/100 | HR 110 | Ht 63.0 in | Wt 283.0 lb

## 2017-05-01 DIAGNOSIS — N939 Abnormal uterine and vaginal bleeding, unspecified: Secondary | ICD-10-CM | POA: Diagnosis not present

## 2017-05-01 DIAGNOSIS — D259 Leiomyoma of uterus, unspecified: Secondary | ICD-10-CM

## 2017-05-01 NOTE — Telephone Encounter (Signed)
RECALL FOR ULTRASOUND 

## 2017-05-01 NOTE — Progress Notes (Signed)
[facility] Visit  @DATE @            Patient name: Whitney Bush MRN [id number]  Date of birth: 1970/03/30  CC & HPI:  Whitney Bush is a 47 y.o. female presenting today for evaluation of abnormal uterine bleeding of 2 years duration.  She had an ultrasound mid 2018 that showed large fibroid uterus 17 cm maximum diameter by 9 x 9 cm, with endometrium that was measured at 3 cm on technically difficult ultrasound she states that she is been bleeding irregularly for 2 years she has no pregnancies and is currently sexually active last sexual activity 1 week ago unprotected  ROS:  ROS missed appointments in the past  Pertinent History Reviewed:   Reviewed: Significant for  Medical         Past Medical History:  Diagnosis Date  . Alcohol abuse   . 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 Anemia   . Blood transfusion without reported diagnosis   . Boil 06/04/2015  . Chronic kidney disease   . Depression   . GERD (gastroesophageal reflux disease)   . GI (gastrointestinal bleed) 07/28/2015  . Heart murmur   . Hyperlipidemia   . Hypertension   . Hypothyroidism   . Migraines   . OCD (obsessive compulsive disorder)   . Peripheral edema   . PTSD (post-traumatic stress disorder)   . Tubular adenoma                               Surgical Hx:    Past Surgical History:  Procedure Laterality Date  . ABCESS DRAINAGE     x5; neck, arm, chest, back  . BIOPSY  02/24/2015   Procedure: BIOPSY;  Surgeon: Danie Binder, MD;  [facility];  Service: Endoscopy;;  gastric bx's  . BIOPSY  12/25/2016   Procedure: BIOPSY;  Surgeon: Daneil Dolin, MD;  [facility];  Service: Endoscopy;;  gastric  . CESAREAN SECTION    . COLONOSCOPY WITH PROPOFOL N/A 06/28/2015   Dr.Rourk- inadequate prep- One 6 mm polyp at the splenic flexure bx= tubular adenoma  . ESOPHAGEAL BANDING N/A 12/25/2016   Procedure: ESOPHAGEAL BANDING with Propofol;   Surgeon: Daneil Dolin, MD;  [facility];  Service: Endoscopy;  Laterality: N/A;  possible banding of varices  . ESOPHAGOGASTRODUODENOSCOPY (EGD) WITH PROPOFOL N/A 02/24/2015   SLF: Grade II esophageal varices Moderate portal hypertensive gastropathy Mild erosive gastritis anemia likely due to many factors: gastritis, gastropathy, coagulopathy, chronic disease  . ESOPHAGOGASTRODUODENOSCOPY (EGD) WITH PROPOFOL N/A 12/25/2016   Procedure: ESOPHAGOGASTRODUODENOSCOPY (EGD) WITH PROPOFOL;  Surgeon: Daneil Dolin, MD;  [facility];  Service: Endoscopy;  Laterality: N/A;  8:45am  . MALONEY DILATION N/A 12/25/2016   Procedure: Venia Minks DILATION;  Surgeon: Daneil Dolin, MD;  [facility];  Service: Endoscopy;  Laterality: N/A;  . POLYPECTOMY  06/28/2015   Procedure: POLYPECTOMY;  Surgeon: Daneil Dolin, MD;  [facility];  Service: Endoscopy;;  at splenic flexure   Medications: Reviewed & Updated - see associated section                       Current Outpatient Medications:  .  acetaminophen (TYLENOL) 325 MG tablet, Take 650 mg by mouth every 6 (six) hours  as needed for mild pain or moderate pain., Disp: , Rfl:  .  B Complex-C (SUPER B COMPLEX PO), Take 1 tablet by mouth daily. , Disp: , Rfl:  .  Cholecalciferol (VITAMIN D PO), Take 1 tablet by mouth daily., Disp: , Rfl:  .  Ferrous Sulfate (IRON) 325 (65 Fe) MG TABS, Take 1 tablet by mouth daily., Disp: , Rfl:  .  FLUoxetine (PROZAC) 20 MG tablet, Take 1 tablet (20 mg total) by mouth daily., Disp: 30 tablet, Rfl: 3 .  furosemide (LASIX) 40 MG tablet, Take 40 mg by mouth daily., Disp: , Rfl:  .  hydrochlorothiazide (HYDRODIURIL) 25 MG tablet, Take 25 mg by mouth daily., Disp: , Rfl:  .  lactulose (CHRONULAC) 10 GM/15ML solution, Take 15 mLs (10 g total) by mouth 2 (two) times daily., Disp: 946 mL, Rfl: 3 .  prazosin (MINIPRESS) 1 MG capsule, Take 1 capsule (1 mg total) by mouth 2 (two) times daily.,  Disp: 60 capsule, Rfl: 1 .  propranolol (INDERAL) 10 MG tablet, Take 1 tablet (10 mg total) by mouth 3 (three) times daily., Disp: 90 tablet, Rfl: 1 .  spironolactone (ALDACTONE) 50 MG tablet, Take 1 tablet (50 mg total) by mouth daily., Disp: 30 tablet, Rfl: 3 .  traZODone (DESYREL) 100 MG tablet, Take 1 tablet (100 mg total) by mouth at bedtime. (Patient taking differently: Take by mouth. 150 mg at bedtime), Disp: 30 tablet, Rfl: 1 .  vitamin E 400 UNIT capsule, Take 400 Units by mouth daily., Disp: , Rfl:    Social History: Reviewed -  reports that she quit smoking about 6 years ago. Her smoking use included cigarettes. She has a 5.00 pack-year smoking history. She has quit using smokeless tobacco. Her smokeless tobacco use included snuff.  Objective Findings:  Vitals: Blood pressure (!) 190/100, pulse (!) 110, height 5\' 3"  (1.6 m), weight 283 lb (128.4 kg).  PHYSICAL EXAMINATION General appearance - alert, well appearing, and in no distress, oriented to person, place, and time and overweight Mental status - alert, oriented to person, place, and time, normal mood, behavior, speech, dress, motor activity, and thought processes Chest -  Heart -  Abdomen -exam limited by obesity.  Uterus palpable to the level of the umbilicus Breasts -  Skin - normal coloration and turgor, no rashes, no suspicious skin lesions noted  PELVIC External genitalia -normal female Vulva -normal Vagina -good uterine support Cervix -normal in appearance Uterus -enlarged firm irregular reaches to the umbilicus estimated 71-21 weeks on difficult exam due to patient obesity  Adnexa -fullness to the right of the umbilicus [employer] -  Rectal - rectal exam not indicated    Assessment & Plan:   A:  1. Uterine fibroids 16-20 weeks size 2. Abnormal uterine bleeding times 2 years 3 history of ultrasound confirmed endometrial thickening 3 cm in 2018 P:  1. Endometrial biopsy to be done next Monday 2. Serum hCG  today

## 2017-05-01 NOTE — Telephone Encounter (Signed)
Letter mailed

## 2017-05-03 ENCOUNTER — Emergency Department (HOSPITAL_COMMUNITY): Payer: Medicaid Other

## 2017-05-03 ENCOUNTER — Encounter (HOSPITAL_COMMUNITY): Payer: Self-pay | Admitting: Emergency Medicine

## 2017-05-03 ENCOUNTER — Emergency Department (HOSPITAL_COMMUNITY)
Admission: EM | Admit: 2017-05-03 | Discharge: 2017-05-03 | Disposition: A | Discharge status: Home / Self Care | Payer: Medicaid Other | Attending: Emergency Medicine | Admitting: Emergency Medicine

## 2017-05-03 ENCOUNTER — Other Ambulatory Visit: Payer: Self-pay

## 2017-05-03 DIAGNOSIS — Z79899 Other long term (current) drug therapy: Secondary | ICD-10-CM | POA: Diagnosis not present

## 2017-05-03 DIAGNOSIS — E039 Hypothyroidism, unspecified: Secondary | ICD-10-CM | POA: Insufficient documentation

## 2017-05-03 DIAGNOSIS — R110 Nausea: Secondary | ICD-10-CM | POA: Diagnosis present

## 2017-05-03 DIAGNOSIS — N189 Chronic kidney disease, unspecified: Secondary | ICD-10-CM | POA: Insufficient documentation

## 2017-05-03 DIAGNOSIS — J069 Acute upper respiratory infection, unspecified: Secondary | ICD-10-CM | POA: Insufficient documentation

## 2017-05-03 DIAGNOSIS — F329 Major depressive disorder, single episode, unspecified: Secondary | ICD-10-CM | POA: Insufficient documentation

## 2017-05-03 DIAGNOSIS — I129 Hypertensive chronic kidney disease with stage 1 through stage 4 chronic kidney disease, or unspecified chronic kidney disease: Secondary | ICD-10-CM | POA: Insufficient documentation

## 2017-05-03 DIAGNOSIS — Z87891 Personal history of nicotine dependence: Secondary | ICD-10-CM | POA: Diagnosis not present

## 2017-05-03 MED ORDER — PREDNISONE 10 MG PO TABS: 10.0000 mg | ORAL_TABLET | Freq: Every day | ORAL | 0 refills | Status: DC

## 2017-05-03 MED ORDER — IPRATROPIUM-ALBUTEROL 0.5-2.5 (3) MG/3ML IN SOLN
3.0000 mL | Freq: Once | RESPIRATORY_TRACT | Status: AC
  Administered 2017-05-03: 3 mL via RESPIRATORY_TRACT
  Filled 2017-05-03: qty 3

## 2017-05-03 MED ORDER — ALBUTEROL SULFATE HFA 108 (90 BASE) MCG/ACT IN AERS: 1.0000 | INHALATION_SPRAY | Freq: Four times a day (QID) | RESPIRATORY_TRACT | 1 refills | Status: DC | PRN

## 2017-05-03 NOTE — Discharge Instructions (Signed)
Chest x-ray showed no pneumonia.  Prescription for inhaler and prednisone.  Increase fluids.

## 2017-05-03 NOTE — ED Provider Notes (Signed)
[facility] EMERGENCY DEPARTMENT Provider Note   CSN: [id number] Arrival date & time: 05/03/17  3154     History   Chief Complaint Chief Complaint  Patient presents with  . Cough    HPI Whitney Bush is a 47 y.o. female.  Productive cough for 1 week with chills, nausea, dizziness, dyspnea.  History of cigarette smoking.  Patient is ambulatory and able to do her normal ADLs.  She is taking nothing at home for her symptoms.  Severity is mild.  Nothing makes symptoms better or worse.      Past Medical History:  Diagnosis Date  . Alcohol abuse   . 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 Anemia   . Blood transfusion without reported diagnosis   . Boil 06/04/2015  . Chronic kidney disease   . Depression   . GERD (gastroesophageal reflux disease)   . GI (gastrointestinal bleed) 07/28/2015  . Heart murmur   . Hyperlipidemia   . Hypertension   . Hypothyroidism   . Migraines   . OCD (obsessive compulsive disorder)   . Peripheral edema   . PTSD (post-traumatic stress disorder)   . Tubular adenoma     Patient Active Problem List   Diagnosis Date Noted  . Abnormal uterine bleeding (AUB) 02/12/2017  . Acute blood loss anemia 11/27/2016  . Thickened endometrium 11/23/2016  . Uterine leiomyoma 11/23/2016  . Menorrhagia with regular cycle 11/23/2016  . Alcoholic cirrhosis of liver ([place]) 07/07/2016  . Esophageal dysphagia 07/07/2016  . Esophageal varices without bleeding ([place]) 07/07/2016  . IDA (iron deficiency anemia) 06/09/2016  . PTSD (post-traumatic stress disorder) 04/20/2016  . OCD (obsessive compulsive disorder) 04/20/2016  . Severe recurrent major depression without psychotic features ([place]) 04/19/2016  . GERD (gastroesophageal reflux disease) 03/09/2016  . Depression 02/07/2016  . Bilateral edema of lower extremity   . History of colonic polyps   . Alcoholic cirrhosis of liver with ascites ([place])   . Anasarca 02/16/2015  . Alcoholic  hepatitis with ascites   . Ascites   . Bleeding gastrointestinal   . AKI (acute kidney injury) ([place]) 12/10/2014  . Volume overload 12/10/2014  . Alcoholic cirrhosis ([place])   . Elevated bilirubin   . Nausea with vomiting   . Diarrhea   . Absolute anemia 12/02/2014  . Jaundice 12/02/2014  . Essential hypertension 12/02/2014  . Alcohol use disorder, moderate, dependence ([place]) 12/02/2014  . Abdominal pain 12/02/2014    Past Surgical History:  Procedure Laterality Date  . ABCESS DRAINAGE     x5; neck, arm, chest, back  . BIOPSY  02/24/2015   Procedure: BIOPSY;  Surgeon: Danie Binder, MD;  [facility];  Service: Endoscopy;;  gastric bx's  . BIOPSY  12/25/2016   Procedure: BIOPSY;  Surgeon: Daneil Dolin, MD;  [facility];  Service: Endoscopy;;  gastric  . CESAREAN SECTION    . COLONOSCOPY WITH PROPOFOL N/A 06/28/2015   Dr.Rourk- inadequate prep- One 6 mm polyp at the splenic flexure bx= tubular adenoma  . ESOPHAGEAL BANDING N/A 12/25/2016   Procedure: ESOPHAGEAL BANDING with Propofol;  Surgeon: Daneil Dolin, MD;  [facility];  Service: Endoscopy;  Laterality: N/A;  possible banding of varices  . ESOPHAGOGASTRODUODENOSCOPY (EGD) WITH PROPOFOL N/A 02/24/2015   SLF: Grade II esophageal varices Moderate portal hypertensive gastropathy Mild erosive gastritis anemia likely due to many factors: gastritis, gastropathy, coagulopathy, chronic disease  .  ESOPHAGOGASTRODUODENOSCOPY (EGD) WITH PROPOFOL N/A 12/25/2016   Procedure: ESOPHAGOGASTRODUODENOSCOPY (EGD) WITH PROPOFOL;  Surgeon: Daneil Dolin, MD;  [facility];  Service: Endoscopy;  Laterality: N/A;  8:45am  . MALONEY DILATION N/A 12/25/2016   Procedure: Venia Minks DILATION;  Surgeon: Daneil Dolin, MD;  [facility];  Service: Endoscopy;  Laterality: N/A;  . POLYPECTOMY  06/28/2015   Procedure: POLYPECTOMY;  Surgeon: Daneil Dolin, MD;  [facility];  Service:  Endoscopy;;  at splenic flexure    OB History    Gravida Para Term Preterm AB Living   2 1 1   1 1    SAB TAB Ectopic Multiple Live Births           1       Home Medications    Prior to Admission medications   Medication Sig Start Date End Date Taking? Authorizing Provider  B Complex-C (SUPER B COMPLEX PO) Take 1 tablet by mouth daily.    Yes [provider]  Cholecalciferol (VITAMIN D PO) Take 1 tablet by mouth once a week.    Yes [provider]  Ferrous Sulfate (IRON) 325 (65 Fe) MG TABS Take 1 tablet by mouth daily.   Yes [provider]  FLUoxetine (PROZAC) 20 MG tablet Take 1 tablet (20 mg total) by mouth daily. 04/20/16  Yes Pucilowska, Jolanta B, MD  furosemide (LASIX) 40 MG tablet Take 40 mg by mouth 2 (two) times daily.    Yes [provider]  prazosin (MINIPRESS) 1 MG capsule Take 1 capsule (1 mg total) by mouth 2 (two) times daily. 04/21/16  Yes Pucilowska, Jolanta B, MD  propranolol (INDERAL) 10 MG tablet Take 1 tablet (10 mg total) by mouth 3 (three) times daily. Patient taking differently: Take 10 mg by mouth daily.  04/20/16  Yes Pucilowska, Jolanta B, MD  spironolactone (ALDACTONE) 50 MG tablet Take 1 tablet (50 mg total) by mouth daily. 11/27/16  Yes Mahala Menghini, PA-C  traZODone (DESYREL) 100 MG tablet Take 1 tablet (100 mg total) by mouth at bedtime. Patient taking differently: Take 150 mg by mouth at bedtime as needed. 150 mg at bedtime 04/21/16  Yes Pucilowska, Jolanta B, MD  vitamin E 400 UNIT capsule Take 400 Units by mouth daily.   Yes [provider]  albuterol (PROVENTIL HFA;VENTOLIN HFA) 108 (90 Base) MCG/ACT inhaler Inhale 1-2 puffs into the lungs every 6 (six) hours as needed for wheezing or shortness of breath. 05/03/17   Nat Christen, MD  predniSONE (DELTASONE) 10 MG tablet Take 1 tablet (10 mg total) by mouth daily with breakfast. 3 tablets for 3 days, 2 tablets for 3 days, 1 tablet for 3 days 05/03/17   Nat Christen,  MD    Family History Family History  Problem Relation Age of Onset  . Diabetes Father   . Hyperlipidemia Father   . Alcohol abuse Father   . Cancer Other   . Cervical cancer Maternal Grandmother   . Lung cancer Maternal Grandfather   . Alcohol abuse Other        multiple family members  . Diabetes Sister   . Hypertension Brother   . Colon cancer Neg Hx   . Liver disease Neg Hx     Social History Social History   Tobacco Use  . Smoking status: Former Smoker    Packs/day: 1.00    Years: 5.00    Pack years: 5.00    Types: Cigarettes  Last attempt to quit: 12/29/2010    Years since quitting: 6.3  . Smokeless tobacco: Former Systems developer    Types: Snuff  Substance Use Topics  . Alcohol use: Yes    Comment: "sometimes"  . Drug use: No     Allergies   Patient has no known allergies.   Review of Systems Review of Systems  All other systems reviewed and are negative.    Physical Exam Updated Vital Signs BP 136/79 (BP Location: Right Arm)   Pulse (!) 106   Temp 98.4 F (36.9 C) (Oral)   Resp 20   Ht 5\' 3"  (1.6 m)   Wt 127.9 kg (282 lb)   LMP 05/03/2017   SpO2 100%   BMI 49.95 kg/m   Physical Exam  Constitutional: She is oriented to person, place, and time. She appears well-developed and well-nourished.  HENT:  Head: Normocephalic and atraumatic.  Eyes: Conjunctivae are normal.  Neck: Neck supple.  Cardiovascular: Normal rate and regular rhythm.  Pulmonary/Chest: Effort normal.  Scant expiratory wheeze.  Abdominal: Soft. Bowel sounds are normal.  Musculoskeletal: Normal range of motion.  Neurological: She is alert and oriented to person, place, and time.  Skin: Skin is warm and dry.  Psychiatric: She has a normal mood and affect. Her behavior is normal.  Nursing note and vitals reviewed.    ED Treatments / Results  Labs (all labs ordered are listed, but only abnormal results are displayed) Labs Reviewed - No data to display  EKG  EKG  Interpretation  Date/Time:  Thursday May 03 2017 09:49:41 EST Ventricular Rate:  107 PR Interval:    QRS Duration: 72 QT Interval:  339 QTC Calculation: 453 R Axis:   29 Text Interpretation:  Sinus tachycardia Ventricular premature complex Borderline T wave abnormalities Confirmed by Nat Christen [phone]) on 05/03/2017 10:20:14 AM       Radiology Dg Chest 2 View  Result Date: 05/03/2017 CLINICAL DATA:  Productive cough EXAM: CHEST - 2 VIEW COMPARISON:  07/27/2015 FINDINGS: The heart size and mediastinal contours are within normal limits. Both lungs are clear. The visualized skeletal structures are unremarkable. IMPRESSION: No active cardiopulmonary disease. Electronically Signed   By: Franchot Gallo M.D.   On: 05/03/2017 12:15    Procedures Procedures (including critical care time)  Medications Ordered in ED Medications  ipratropium-albuterol (DUONEB) 0.5-2.5 (3) MG/3ML nebulizer solution 3 mL (3 mLs Nebulization Given 05/03/17 1239)     Initial Impression / Assessment and Plan / ED Course  I have reviewed the triage vital signs and the nursing notes.  Pertinent labs & imaging results that were available during my care of the patient were reviewed by me and considered in my medical decision making (see chart for details).     Patient presents with productive cough for 1 week.  Her physical exam reveals no acute distress.  Chest x-ray negative for pneumonia.  Suspect viral etiology.  Will Rx albuterol inhaler and prednisone.  Final Clinical Impressions(s) / ED Diagnoses   Final diagnoses:  Upper respiratory tract infection, unspecified type    ED Discharge Orders        Ordered    albuterol (PROVENTIL HFA;VENTOLIN HFA) 108 (90 Base) MCG/ACT inhaler  Every 6 hours PRN     05/03/17 1359    predniSONE (DELTASONE) 10 MG tablet  Daily with breakfast     05/03/17 1359       Nat Christen, MD 05/03/17 1431

## 2017-05-03 NOTE — ED Triage Notes (Addendum)
Patient c/o productive cough with thick green/yellow sputum x1 week. Per patient chills, nausea, dizziness, and shortness of breath. Denies hx of asthma. Per patient hx of pneumonia and "fluid in lungs". Denies any  vomiting or diarrhea. Per patient pain in chest with deep breath and "heart racing."

## 2017-05-07 ENCOUNTER — Encounter: Payer: Self-pay | Admitting: Obstetrics and Gynecology

## 2017-05-07 ENCOUNTER — Ambulatory Visit: Payer: Medicaid Other | Admitting: Obstetrics and Gynecology

## 2017-05-07 ENCOUNTER — Other Ambulatory Visit: Payer: Self-pay | Admitting: Obstetrics and Gynecology

## 2017-05-07 VITALS — BP 120/80 | HR 80 | Ht 63.0 in | Wt 278.4 lb

## 2017-05-07 DIAGNOSIS — N939 Abnormal uterine and vaginal bleeding, unspecified: Secondary | ICD-10-CM

## 2017-05-07 DIAGNOSIS — Z3202 Encounter for pregnancy test, result negative: Secondary | ICD-10-CM | POA: Diagnosis not present

## 2017-05-07 LAB — POCT URINE PREGNANCY: PREG TEST UR: NEGATIVE

## 2017-05-07 NOTE — Progress Notes (Signed)
Patient ID: Whitney Bush, female   DOB: 10/07/1970, 47 y.o.   MRN: [id number]   Whitney Bush reports bleeding since her last visit on 05/01/2017. She was on Megace, but was told to stop about three weeks ago.  She is been bleeding heavily since discontinuing the Megace  Endometrial Biopsy: Patient given informed consent, signed copy in the chart, time out was performed. Time out taken. The patient was placed in the lithotomy position and the cervix brought into view with sterile speculum.  Portio of cervix cleansed x 2 with betadine swabs.  A tenaculum was placed in the anterior lip of the cervix. The uterus was sounded for depth of 14 cm,. Milex uterine Explora 3 mm was introduced to into the uterus, suction created,  and an endometrial sample was obtained. All equipment was removed and accounted for.   The patient tolerated the procedure well.   Patient given post procedure instructions.  Followup: 3 months Patient will restart taking megace. She would like to lose weight before surgery.  And declines consideration of surgery at this time.  She is to see her primary care doctor next week in the past she has been able to work with her diet with her primary care physician and successfully lose weight down to 180 pounds  By signing my name below, I, [name], attest that this documentation has been prepared under the direction and in the presence of Jonnie Kind, MD. Electronically Signed: [name], Medical Scribe. 05/07/17. 11:00 AM.  I personally performed the services described in this documentation, which was SCRIBED in my presence. The recorded information has been reviewed and considered accurate. It has been edited as necessary during review. Jonnie Kind, MD

## 2017-05-07 NOTE — Addendum Note (Signed)
Addended by: Diona Fanti A on: 05/07/2017 11:59 AM   Modules accepted: Orders

## 2017-05-15 ENCOUNTER — Ambulatory Visit (INDEPENDENT_AMBULATORY_CARE_PROVIDER_SITE_OTHER): Payer: Medicaid Other | Admitting: Gastroenterology

## 2017-05-15 ENCOUNTER — Encounter: Payer: Self-pay | Admitting: Gastroenterology

## 2017-05-15 VITALS — BP 145/90 | HR 111 | Temp 98.0°F | Ht 63.0 in | Wt 272.4 lb

## 2017-05-15 DIAGNOSIS — K7030 Alcoholic cirrhosis of liver without ascites: Secondary | ICD-10-CM

## 2017-05-15 NOTE — Assessment & Plan Note (Signed)
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 In early 2018 presented with an [place]. Improved with cessation of alcohol. She's had long periods of sobriety. At this time she tells me she drinks alcohol about 2 to 3 times per month. Has continued to decline follow-up at [facility] liver clinic stating that she is not interested in pursuing liver transplant at this time. She is due for labs and ultrasound for Hepatoma screening.   Recent EGD with esophageal varices as noted. No banding performed. She is supposed to be on propranolol 10 mg TID but is only taking once daily. Current heart rate in the 110 range. Likely exacerbated by her anemia from the Menstrual losses.  Return to office in six months or call sooner if needed. Further recommendations pending u/s and labs.

## 2017-05-15 NOTE — Patient Instructions (Signed)
PA info for US abdomen RUQ submitted via [facility]. Case approved. PA# [id number].

## 2017-05-15 NOTE — Patient Instructions (Addendum)
1. Please take propranolol three times daily. This medication should help keep your pulse down and reduce risk of bleeding from blood vessels in your esophagus (related to the cirrhosis).  2. Please have your labs and ultrasound done. 3. Return to the office in six months.

## 2017-05-15 NOTE — Progress Notes (Signed)
cc'ed to pcp °

## 2017-05-15 NOTE — Progress Notes (Signed)
Primary Care Physician: [facility], [facility]  Primary Gastroenterologist:  Garfield Cornea, MD   Chief Complaint  Patient presents with  . Cirrhosis    f/u.    HPI: Whitney Bush is a 47 y.o. female here for f/u of cirrhosis. She had EGD October 2018 showing two columns of grade 2 esophageal varices, one column of grade 3. Mild portal hypertensive gastroscopy, biopsy showing reactive gastroscopy but no H. pylori. Esophagus dilated with 50 [place].  Patient's biggest complaints of ongoing vaginally bleeding. She states she's been having bleeding for the past six months, sometimes with clots. Has been seen by Dr. Glo Herring. Patient has uterine fibroids. She states that she started back on Magace two days ago. She takes iron supplements. She states that she cannot have surgery until she is lost weight. She said she is not allowed to have surgery until she drops 80 pounds. This is not what is documented by Dr. Glo Herring. Apparently patient declined having surgery.   Weight is down 10 pounds in the past couple of weeks. She is trying to lose weight.  Up 80 pounds since 06/2015.   From G.I. standpoint she seems to be doing okay. She has some vague upper abdominal discomfort, unrelated to meals, feels like a spasm. No heartburn or dysphagia. Bowel movements are pretty good. No melena. Rare rectal bleeding. She continues to drink alcohol on occasion, 2 to 3 times per month. She has been stressed out, her stepfather recently passed away due to cancer.  She states she dropped back taking propranolol to once daily only because she couldn't remember to take it the other times. She states she will go back taking TID. Denies any side effects related to the medication.    Current Outpatient Medications  Medication Sig Dispense Refill  . albuterol (PROVENTIL HFA;VENTOLIN HFA) 108 (90 Base) MCG/ACT inhaler Inhale 1-2 puffs into the lungs every 6 (six) hours as needed for  wheezing or shortness of breath. 1 Inhaler 1  . B Complex-C (SUPER B COMPLEX PO) Take 1 tablet by mouth daily.     . Cholecalciferol (VITAMIN D PO) Take 1 tablet by mouth once a week.     . Ferrous Sulfate (IRON) 325 (65 Fe) MG TABS Take 1 tablet by mouth daily.    . furosemide (LASIX) 40 MG tablet Take 40 mg by mouth 2 (two) times daily.     Marland Kitchen lactulose (CONSTULOSE) 10 GM/15ML solution Take 30 g by mouth 3 (three) times daily.    . megestrol (MEGACE) 40 MG tablet Take 40 mg by mouth 3 (three) times daily.    . predniSONE (DELTASONE) 10 MG tablet Take 1 tablet (10 mg total) by mouth daily with breakfast. 3 tablets for 3 days, 2 tablets for 3 days, 1 tablet for 3 days 18 tablet 0  . propranolol (INDERAL) 10 MG tablet Take 1 tablet (10 mg total) by mouth 3 (three) times daily. (Patient taking differently: Take 10 mg by mouth daily. ) 90 tablet 1  . spironolactone (ALDACTONE) 50 MG tablet Take 1 tablet (50 mg total) by mouth daily. 30 tablet 3  . traZODone (DESYREL) 100 MG tablet Take 1 tablet (100 mg total) by mouth at bedtime. (Patient taking differently: Take 150 mg by mouth at bedtime as needed. 150 mg at bedtime) 30 tablet 1  . vitamin E 400 UNIT capsule Take 400 Units by mouth daily.     No current facility-administered medications for this visit.  Allergies as of 05/15/2017  . (No Known Allergies)    ROS:  General: Negative for anorexia, weight loss, fever, chills, fatigue, weakness. ENT: Negative for hoarseness, difficulty swallowing , nasal congestion. CV: Negative for chest pain, angina, palpitations, dyspnea on exertion, peripheral edema.  Respiratory: Negative for dyspnea at rest, dyspnea on exertion, cough, sputum, wheezing.  GI: See history of present illness. GU:  Negative for dysuria, hematuria, urinary incontinence, urinary frequency, nocturnal urination.  Endo: Negative for unusual weight change.    Physical Examination:   BP (!) 145/90   Pulse (!) 111   Temp 98 F  (36.7 C) (Oral)   Ht 5\' 3"  (1.6 m)   Wt 272 lb 6.4 oz (123.6 kg)   LMP 05/03/2017   BMI 48.25 kg/m   General: Well-nourished, well-developed in no acute distress.  Eyes: No icterus. Mouth: Oropharyngeal mucosa moist and pink , no lesions erythema or exudate. Lungs: Clear to auscultation bilaterally.  Heart: Regular rate and rhythm, no murmurs rubs or gallops.  Abdomen: Bowel sounds are normal, nontender, nondistended, no hepatosplenomegaly or masses, no abdominal bruits or hernia , no rebound or guarding.   Extremities: No lower extremity edema. No clubbing or deformities. Neuro: Alert and oriented x 4   Skin: Warm and dry, no jaundice.   Psych: Alert and cooperative, normal mood and affect.  Labs:  Lab Results  Component Value Date   CREATININE 0.95 12/19/2016   BUN 11 12/19/2016   NA 139 12/19/2016   K 3.7 12/19/2016   CL 108 12/19/2016   CO2 20 (L) 12/19/2016   Lab Results  Component Value Date   ALT 16 11/23/2016   AST 29 11/23/2016   ALKPHOS 62 11/23/2016   BILITOT 1.7 (H) 11/23/2016   Lab Results  Component Value Date   WBC 8.5 12/19/2016   HGB 10.5 (A) 04/16/2017   HCT 32.5 (L) 12/19/2016   MCV 91.3 12/19/2016   PLT 137 (L) 12/19/2016     Imaging Studies: Dg Chest 2 View  Result Date: 05/03/2017 CLINICAL DATA:  Productive cough EXAM: CHEST - 2 VIEW COMPARISON:  07/27/2015 FINDINGS: The heart size and mediastinal contours are within normal limits. Both lungs are clear. The visualized skeletal structures are unremarkable. IMPRESSION: No active cardiopulmonary disease. Electronically Signed   By: Franchot Gallo M.D.   On: 05/03/2017 12:15

## 2017-05-18 ENCOUNTER — Ambulatory Visit (HOSPITAL_COMMUNITY)
Admission: RE | Admit: 2017-05-18 | Discharge: 2017-05-18 | Disposition: A | Discharge status: Home / Self Care | Payer: Medicaid Other | Source: Ambulatory Visit | Attending: Gastroenterology | Admitting: Gastroenterology

## 2017-05-18 DIAGNOSIS — K7689 Other specified diseases of liver: Secondary | ICD-10-CM | POA: Insufficient documentation

## 2017-05-18 DIAGNOSIS — K8020 Calculus of gallbladder without cholecystitis without obstruction: Secondary | ICD-10-CM | POA: Diagnosis not present

## 2017-05-18 DIAGNOSIS — K7030 Alcoholic cirrhosis of liver without ascites: Secondary | ICD-10-CM

## 2017-05-18 DIAGNOSIS — K7460 Unspecified cirrhosis of liver: Secondary | ICD-10-CM | POA: Diagnosis present

## 2017-05-25 NOTE — Progress Notes (Signed)
Stable u/s. No liver masses.  Next ruq u/s in six months.  Remind patient to have her labs done.

## 2017-05-31 ENCOUNTER — Other Ambulatory Visit: Payer: Self-pay

## 2017-05-31 DIAGNOSIS — K7030 Alcoholic cirrhosis of liver without ascites: Secondary | ICD-10-CM

## 2017-05-31 LAB — CBC WITH DIFFERENTIAL/PLATELET
BASOS ABS: 11 {cells}/uL (ref 0–200)
Basophils Relative: 0.2 %
EOS ABS: 132 {cells}/uL (ref 15–500)
Eosinophils Relative: 2.4 %
HCT: 20.3 % — ABNORMAL LOW (ref 35.0–45.0)
Hemoglobin: 6.4 g/dL — ABNORMAL LOW (ref 11.7–15.5)
Lymphs Abs: 1095 cells/uL (ref 850–3900)
MCH: 25.9 pg — AB (ref 27.0–33.0)
MCHC: 31.5 g/dL — AB (ref 32.0–36.0)
MCV: 82.2 fL (ref 80.0–100.0)
MPV: 11.8 fL (ref 7.5–12.5)
Monocytes Relative: 8.3 %
NEUTROS PCT: 69.2 %
Neutro Abs: 3806 cells/uL (ref 1500–7800)
PLATELETS: 87 10*3/uL — AB (ref 140–400)
RBC: 2.47 10*6/uL — AB (ref 3.80–5.10)
RDW: 16.4 % — AB (ref 11.0–15.0)
TOTAL LYMPHOCYTE: 19.9 %
WBC mixed population: 457 cells/uL (ref 200–950)
WBC: 5.5 10*3/uL (ref 3.8–10.8)

## 2017-05-31 LAB — COMPREHENSIVE METABOLIC PANEL
AG RATIO: 0.9 (calc) — AB (ref 1.0–2.5)
ALT: 29 U/L (ref 6–29)
AST: 44 U/L — AB (ref 10–35)
Albumin: 3.5 g/dL — ABNORMAL LOW (ref 3.6–5.1)
Alkaline phosphatase (APISO): 91 U/L (ref 33–115)
BUN: 9 mg/dL (ref 7–25)
CHLORIDE: 103 mmol/L (ref 98–110)
CO2: 27 mmol/L (ref 20–32)
Calcium: 8.5 mg/dL — ABNORMAL LOW (ref 8.6–10.2)
Creat: 1 mg/dL (ref 0.50–1.10)
GLOBULIN: 3.7 g/dL (ref 1.9–3.7)
GLUCOSE: 106 mg/dL — AB (ref 65–99)
Potassium: 3.6 mmol/L (ref 3.5–5.3)
Sodium: 138 mmol/L (ref 135–146)
Total Bilirubin: 1.1 mg/dL (ref 0.2–1.2)
Total Protein: 7.2 g/dL (ref 6.1–8.1)

## 2017-05-31 LAB — AFP TUMOR MARKER: AFP-Tumor Marker: 2.2 ng/mL

## 2017-05-31 LAB — LIPASE: Lipase: 15 U/L (ref 7–60)

## 2017-05-31 LAB — PROTIME-INR
INR: 1.1
PROTHROMBIN TIME: 12 s — AB (ref 9.0–11.5)

## 2017-05-31 NOTE — Progress Notes (Signed)
Liver labs stable. MELD 8.  Her Hgb is down from 10.5 to 6.4 in two months.   PLEASE FIND OUT IF SHE IS HAVING ANY BLACK STOOLS OR BLOOD IN STOOL? IS SHE FEELING SOB, LIGHTHEADED? SHE HAS H/O HEAVY VAGINAL BLEEDING IN SETTING OF LOW PLATELET COUNT/FIBROIDS.   SHE NEEDS 2 UNITS OF PRBCS THIS WEEK! SHE NEEDS TO CALL DR. FERGUSON'S OFFICE AND LET THEM KNOW HER HGB IS DOWN TO 6.4 SO THEY CAN MANAGE VAGINAL BLEEDING. I will also forward this to their office.   NIC for repeat AFP, CMET, PT/INR, CBC in 6 months.

## 2017-06-01 ENCOUNTER — Encounter (HOSPITAL_COMMUNITY)
Admission: RE | Admit: 2017-06-01 | Discharge: 2017-06-01 | Disposition: A | Discharge status: Home / Self Care | Payer: Medicaid Other | Source: Ambulatory Visit | Attending: Gastroenterology | Admitting: Gastroenterology

## 2017-06-01 DIAGNOSIS — N939 Abnormal uterine and vaginal bleeding, unspecified: Secondary | ICD-10-CM | POA: Diagnosis not present

## 2017-06-01 DIAGNOSIS — Z01818 Encounter for other preprocedural examination: Secondary | ICD-10-CM | POA: Diagnosis not present

## 2017-06-01 DIAGNOSIS — D649 Anemia, unspecified: Secondary | ICD-10-CM | POA: Insufficient documentation

## 2017-06-01 DIAGNOSIS — Z01812 Encounter for preprocedural laboratory examination: Secondary | ICD-10-CM | POA: Insufficient documentation

## 2017-06-01 DIAGNOSIS — K7460 Unspecified cirrhosis of liver: Secondary | ICD-10-CM | POA: Diagnosis not present

## 2017-06-01 DIAGNOSIS — Z0183 Encounter for blood typing: Secondary | ICD-10-CM | POA: Insufficient documentation

## 2017-06-01 LAB — HEMOGLOBIN AND HEMATOCRIT, BLOOD
HCT: 22.9 % — ABNORMAL LOW (ref 36.0–46.0)
Hemoglobin: 6.7 g/dL — CL (ref 12.0–15.0)

## 2017-06-01 LAB — PREPARE RBC (CROSSMATCH)

## 2017-06-01 NOTE — Progress Notes (Signed)
Ordering provider out of office today, and I'm covering box for urgent issues. Hgb 6.7, but I was not notified about it by lab. I noted this result around 1220 today. Hgb was 6.4 two days ago. Patient has significant vaginal bleeding as per office note. She also has history of cirrhosis but is DENYING any overt GI bleeding. EGD fairly recently. I contacted patient, who states she feels woozy. Spoke with short stay RN, Wells Guiles, who states patient did not look acutely ill and stated to them she felt fine. [facility] had tried to get outpatient transfusion in an expedited manner, but patient was unable to do so. Now, outpatient transfusion is scheduled for Monday. Short stay is unable to transfuse today.  I instructed patient to go to ED due to symptomatic anemia. She states "I will go if I start feeling worse". I again spoke with her about the risks of delaying care, and she states understanding but still declining ED evaluation for transfusion today.

## 2017-06-01 NOTE — Progress Notes (Signed)
Blood drawn for type & crossmatch and sent to lab for results. Will be transfused on 06/04/2017. Consent signed. Questions answered. Voiced understanding.

## 2017-06-03 ENCOUNTER — Other Ambulatory Visit: Payer: Self-pay

## 2017-06-03 ENCOUNTER — Observation Stay (HOSPITAL_COMMUNITY)
Admission: EM | Admit: 2017-06-03 | Discharge: 2017-06-04 | Disposition: A | Discharge status: Home / Self Care | Payer: Medicaid Other | Attending: Family Medicine | Admitting: Family Medicine

## 2017-06-03 ENCOUNTER — Encounter (HOSPITAL_COMMUNITY): Payer: Self-pay | Admitting: Emergency Medicine

## 2017-06-03 DIAGNOSIS — F332 Major depressive disorder, recurrent severe without psychotic features: Secondary | ICD-10-CM | POA: Diagnosis present

## 2017-06-03 DIAGNOSIS — Z79899 Other long term (current) drug therapy: Secondary | ICD-10-CM | POA: Insufficient documentation

## 2017-06-03 DIAGNOSIS — Z87891 Personal history of nicotine dependence: Secondary | ICD-10-CM | POA: Diagnosis not present

## 2017-06-03 DIAGNOSIS — N939 Abnormal uterine and vaginal bleeding, unspecified: Secondary | ICD-10-CM | POA: Diagnosis not present

## 2017-06-03 DIAGNOSIS — I129 Hypertensive chronic kidney disease with stage 1 through stage 4 chronic kidney disease, or unspecified chronic kidney disease: Secondary | ICD-10-CM | POA: Diagnosis not present

## 2017-06-03 DIAGNOSIS — N189 Chronic kidney disease, unspecified: Secondary | ICD-10-CM | POA: Insufficient documentation

## 2017-06-03 DIAGNOSIS — D649 Anemia, unspecified: Secondary | ICD-10-CM

## 2017-06-03 DIAGNOSIS — R0602 Shortness of breath: Secondary | ICD-10-CM | POA: Insufficient documentation

## 2017-06-03 DIAGNOSIS — E039 Hypothyroidism, unspecified: Secondary | ICD-10-CM | POA: Insufficient documentation

## 2017-06-03 DIAGNOSIS — D509 Iron deficiency anemia, unspecified: Secondary | ICD-10-CM | POA: Diagnosis present

## 2017-06-03 DIAGNOSIS — I10 Essential (primary) hypertension: Secondary | ICD-10-CM | POA: Diagnosis present

## 2017-06-03 DIAGNOSIS — K7031 Alcoholic cirrhosis of liver with ascites: Secondary | ICD-10-CM | POA: Diagnosis present

## 2017-06-03 DIAGNOSIS — I8500 Esophageal varices without bleeding: Secondary | ICD-10-CM | POA: Diagnosis present

## 2017-06-03 LAB — CBC WITH DIFFERENTIAL/PLATELET
BASOS PCT: 0 %
Basophils Absolute: 0 10*3/uL (ref 0.0–0.1)
Eosinophils Absolute: 0.1 10*3/uL (ref 0.0–0.7)
Eosinophils Relative: 2 %
HEMATOCRIT: 21.5 % — AB (ref 36.0–46.0)
HEMOGLOBIN: 6.3 g/dL — AB (ref 12.0–15.0)
Lymphocytes Relative: 33 %
Lymphs Abs: 1.8 10*3/uL (ref 0.7–4.0)
MCH: 24.6 pg — ABNORMAL LOW (ref 26.0–34.0)
MCHC: 29.3 g/dL — ABNORMAL LOW (ref 30.0–36.0)
MCV: 84 fL (ref 78.0–100.0)
MONO ABS: 0.6 10*3/uL (ref 0.1–1.0)
Monocytes Relative: 11 %
NEUTROS ABS: 2.9 10*3/uL (ref 1.7–7.7)
NEUTROS PCT: 54 %
PLATELETS: 137 10*3/uL — AB (ref 150–400)
RBC: 2.56 MIL/uL — ABNORMAL LOW (ref 3.87–5.11)
RDW: 17.8 % — AB (ref 11.5–15.5)
WBC: 5.4 10*3/uL (ref 4.0–10.5)

## 2017-06-03 LAB — COMPREHENSIVE METABOLIC PANEL
ALT: 79 U/L — ABNORMAL HIGH (ref 14–54)
AST: 137 U/L — ABNORMAL HIGH (ref 15–41)
Albumin: 3.3 g/dL — ABNORMAL LOW (ref 3.5–5.0)
Alkaline Phosphatase: 107 U/L (ref 38–126)
Anion gap: 11 (ref 5–15)
BUN: 9 mg/dL (ref 6–20)
CHLORIDE: 107 mmol/L (ref 101–111)
CO2: 24 mmol/L (ref 22–32)
Calcium: 8.6 mg/dL — ABNORMAL LOW (ref 8.9–10.3)
Creatinine, Ser: 1.03 mg/dL — ABNORMAL HIGH (ref 0.44–1.00)
Glucose, Bld: 108 mg/dL — ABNORMAL HIGH (ref 65–99)
POTASSIUM: 3.6 mmol/L (ref 3.5–5.1)
Sodium: 142 mmol/L (ref 135–145)
Total Bilirubin: 1 mg/dL (ref 0.3–1.2)
Total Protein: 7.7 g/dL (ref 6.5–8.1)

## 2017-06-03 LAB — PROTIME-INR
INR: 1.16
PROTHROMBIN TIME: 14.7 s (ref 11.4–15.2)

## 2017-06-03 MED ORDER — LORAZEPAM 1 MG PO TABS: 1.0000 mg | ORAL_TABLET | Freq: Four times a day (QID) | ORAL | Status: DC | PRN

## 2017-06-03 MED ORDER — VITAMIN B-1 100 MG PO TABS
100.0000 mg | ORAL_TABLET | Freq: Every day | ORAL | Status: DC
  Administered 2017-06-03 – 2017-06-04 (×2): 100 mg via ORAL
  Filled 2017-06-03 (×2): qty 1

## 2017-06-03 MED ORDER — SODIUM CHLORIDE 0.9 % IV SOLN
10.0000 mL/h | Freq: Once | INTRAVENOUS | Status: AC
  Administered 2017-06-03: 10 mL/h via INTRAVENOUS

## 2017-06-03 MED ORDER — LORAZEPAM 1 MG PO TABS
0.0000 mg | ORAL_TABLET | Freq: Four times a day (QID) | ORAL | Status: DC
  Administered 2017-06-03: 1 mg via ORAL
  Filled 2017-06-03: qty 1

## 2017-06-03 MED ORDER — ALBUTEROL SULFATE (2.5 MG/3ML) 0.083% IN NEBU
2.5000 mg | INHALATION_SOLUTION | Freq: Four times a day (QID) | RESPIRATORY_TRACT | Status: DC | PRN
Start: 2017-06-03 — End: 2017-06-04

## 2017-06-03 MED ORDER — PROPRANOLOL HCL 20 MG PO TABS
10.0000 mg | ORAL_TABLET | Freq: Three times a day (TID) | ORAL | Status: DC
  Administered 2017-06-03 – 2017-06-04 (×3): 10 mg via ORAL
  Filled 2017-06-03 (×3): qty 1

## 2017-06-03 MED ORDER — FUROSEMIDE 40 MG PO TABS
40.0000 mg | ORAL_TABLET | Freq: Two times a day (BID) | ORAL | Status: DC
  Administered 2017-06-04 (×2): 40 mg via ORAL
  Filled 2017-06-03 (×2): qty 1

## 2017-06-03 MED ORDER — THIAMINE HCL 100 MG/ML IJ SOLN
100.0000 mg | Freq: Every day | INTRAMUSCULAR | Status: DC
  Filled 2017-06-03: qty 2

## 2017-06-03 MED ORDER — LORAZEPAM 1 MG PO TABS: 0.0000 mg | ORAL_TABLET | Freq: Two times a day (BID) | ORAL | Status: DC

## 2017-06-03 MED ORDER — FOLIC ACID 1 MG PO TABS
1.0000 mg | ORAL_TABLET | Freq: Every day | ORAL | Status: DC
  Administered 2017-06-03 – 2017-06-04 (×2): 1 mg via ORAL
  Filled 2017-06-03 (×2): qty 1

## 2017-06-03 MED ORDER — TRAZODONE HCL 50 MG PO TABS
100.0000 mg | ORAL_TABLET | Freq: Every day | ORAL | Status: DC
  Administered 2017-06-03: 100 mg via ORAL
  Filled 2017-06-03: qty 2

## 2017-06-03 MED ORDER — LACTULOSE 10 GM/15ML PO SOLN
30.0000 g | Freq: Three times a day (TID) | ORAL | Status: DC
  Administered 2017-06-03 – 2017-06-04 (×3): 30 g via ORAL
  Filled 2017-06-03 (×3): qty 60

## 2017-06-03 MED ORDER — LORAZEPAM 2 MG/ML IJ SOLN: 1.0000 mg | Freq: Four times a day (QID) | INTRAMUSCULAR | Status: DC | PRN

## 2017-06-03 MED ORDER — MEGESTROL ACETATE 40 MG PO TABS
40.0000 mg | ORAL_TABLET | Freq: Three times a day (TID) | ORAL | Status: DC
  Administered 2017-06-04: 40 mg via ORAL
  Filled 2017-06-03 (×9): qty 1

## 2017-06-03 MED ORDER — KETOROLAC TROMETHAMINE 30 MG/ML IJ SOLN
30.0000 mg | Freq: Three times a day (TID) | INTRAMUSCULAR | Status: DC | PRN
  Administered 2017-06-03 – 2017-06-04 (×2): 30 mg via INTRAVENOUS
  Filled 2017-06-03 (×2): qty 1

## 2017-06-03 MED ORDER — ADULT MULTIVITAMIN W/MINERALS CH
1.0000 | ORAL_TABLET | Freq: Every day | ORAL | Status: DC
  Administered 2017-06-03 – 2017-06-04 (×2): 1 via ORAL
  Filled 2017-06-03 (×2): qty 1

## 2017-06-03 MED ORDER — SPIRONOLACTONE 25 MG PO TABS
50.0000 mg | ORAL_TABLET | Freq: Every day | ORAL | Status: DC
  Administered 2017-06-04: 50 mg via ORAL
  Filled 2017-06-03 (×2): qty 2

## 2017-06-03 MED ORDER — FERROUS SULFATE 325 (65 FE) MG PO TABS
325.0000 mg | ORAL_TABLET | Freq: Every day | ORAL | Status: DC
  Administered 2017-06-04: 325 mg via ORAL
  Filled 2017-06-03 (×2): qty 1

## 2017-06-03 NOTE — H&P (Signed)
History and Physical    Whitney Bush HKV:[id number] DOB: Jul 13, 1970 DOA: 06/03/2017  PCP: [facility], [facility]   Patient coming from: Home   Chief Complaint: SOB, Weakness  HPI: Whitney Bush is a 47 y.o. female with medical history significant for Alcoholic liver cirrhosis, esophageal varices, depression, iron deficiency anemia, uterine leiomyomas, who presented to the ED with complaints of shortness of breath and generalized weakness worse over the past few days.  Patient saw her primary care provider, hemoglobin was low at 6.7, patient was called about 3 days ago to come in for transfusion tomorrow- 06/04/17, but she felt too weak and short of breath to wait until tomorrow hence presentation in the ED today. Patient reports chronic menorrhagia, but over the past year she has been seeing her period Every day, with blood clots.  She has been seen by gynecology, was placed on Megace which patient states is not helping, per chart it appears she has declined hysterectomy.  Patient reports menorrhagia is worsening. She denies NSAID use.  No abdominal pain or vomiting.  Endorses chronic bilateral lower extremity swelling.   ED Course: Initial mild tachycardia- 116, otherwise stable vitals.  Hemoglobin low 6.3, platelets 137.  AST ALT elevated- 137, 79, expectedly, albumin low 3.3.  EKG shows sinus tachycardia unchanged from prior.  Hospitalist was called to admit for symptomatic anemia, 2U PRBC ordered in ED.  Review of Systems: As per HPI otherwise 10 point review of systems negative.   Past Medical History:  Diagnosis Date  . Alcohol abuse   . 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 Anemia   . Blood transfusion without reported diagnosis   . Boil 06/04/2015  . Chronic kidney disease   . Depression   . GERD (gastroesophageal reflux disease)   . GI (gastrointestinal bleed) 07/28/2015  . Heart murmur   . Hyperlipidemia   . Hypertension     . Hypothyroidism   . Migraines   . OCD (obsessive compulsive disorder)   . Peripheral edema   . PTSD (post-traumatic stress disorder)   . Tubular adenoma     Past Surgical History:  Procedure Laterality Date  . ABCESS DRAINAGE     x5; neck, arm, chest, back  . BIOPSY  02/24/2015   Procedure: BIOPSY;  Surgeon: Danie Binder, MD;  [facility]: [facility];  Service: Endoscopy;;  gastric bx's  . BIOPSY  12/25/2016   Procedure: BIOPSY;  Surgeon: Daneil Dolin, MD;  Location: [facility];  Service: Endoscopy;;  gastric  . CESAREAN SECTION    . COLONOSCOPY WITH PROPOFOL N/A 06/28/2015   Dr.Rourk- inadequate prep- One 6 mm polyp at the splenic flexure bx= tubular adenoma  . ESOPHAGEAL BANDING N/A 12/25/2016   Procedure: ESOPHAGEAL BANDING with Propofol;  Surgeon: Daneil Dolin, MD;  Location: [facility];  Service: Endoscopy;  Laterality: N/A;  possible banding of varices  . ESOPHAGOGASTRODUODENOSCOPY (EGD) WITH PROPOFOL N/A 02/24/2015   SLF: Grade II esophageal varices Moderate portal hypertensive gastropathy Mild erosive gastritis anemia likely due to many factors: gastritis, gastropathy, coagulopathy, chronic disease  . ESOPHAGOGASTRODUODENOSCOPY (EGD) WITH PROPOFOL N/A 12/25/2016   Procedure: ESOPHAGOGASTRODUODENOSCOPY (EGD) WITH PROPOFOL;  Surgeon: Daneil Dolin, MD;  Location: [facility];  Service: Endoscopy;  Laterality: N/A;  8:45am  . MALONEY DILATION N/A 12/25/2016   Procedure: Venia Minks DILATION;  Surgeon: Daneil Dolin, MD;  Location: [facility];  Service: Endoscopy;  Laterality: N/A;  . POLYPECTOMY  06/28/2015   Procedure: POLYPECTOMY;  Surgeon: Daneil Dolin, MD;  Location: [facility];  Service: Endoscopy;;  at splenic flexure     reports that she quit smoking about 6 years ago. Her smoking use included cigarettes. She has a 5.00 pack-year smoking history. She has quit using smokeless tobacco. Her smokeless tobacco use included snuff. She reports that she  drinks alcohol. She reports that she does not use drugs.  No Known Allergies  Family History  Problem Relation Age of Onset  . Diabetes Father   . Hyperlipidemia Father   . Alcohol abuse Father   . Cancer Other   . Cervical cancer Maternal Grandmother   . Lung cancer Maternal Grandfather   . Alcohol abuse Other        multiple family members  . Diabetes Sister   . Hypertension Brother   . Colon cancer Neg Hx   . Liver disease Neg Hx    Prior to Admission medications   Medication Sig Start Date End Date Taking? Authorizing Provider  acetaminophen (TYLENOL) 500 MG tablet Take 1,000 mg by mouth every 6 (six) hours as needed for headache.   Yes [provider]  albuterol (PROVENTIL HFA;VENTOLIN HFA) 108 (90 Base) MCG/ACT inhaler Inhale 1-2 puffs into the lungs every 6 (six) hours as needed for wheezing or shortness of breath. 05/03/17  Yes Nat Christen, MD  B Complex-C (SUPER B COMPLEX PO) Take 1 tablet by mouth daily.    Yes [provider]  Cholecalciferol (VITAMIN D PO) Take 1 tablet by mouth once a week.    Yes [provider]  Ferrous Sulfate (IRON) 325 (65 Fe) MG TABS Take 1 tablet by mouth daily.   Yes [provider]  furosemide (LASIX) 40 MG tablet Take 40 mg by mouth 2 (two) times daily.    Yes [provider]  lactulose (CONSTULOSE) 10 GM/15ML solution Take 30 g by mouth 3 (three) times daily.   Yes [provider]  megestrol (MEGACE) 40 MG tablet Take 40 mg by mouth 3 (three) times daily.   Yes [provider]  predniSONE (DELTASONE) 10 MG tablet Take 1 tablet (10 mg total) by mouth daily with breakfast. 3 tablets for 3 days, 2 tablets for 3 days, 1 tablet for 3 days 05/03/17  Yes Nat Christen, MD  propranolol (INDERAL) 10 MG tablet Take 1 tablet (10 mg total) by mouth 3 (three) times daily. Patient taking differently: Take 10 mg by mouth daily.  04/20/16  Yes Pucilowska, Jolanta B, MD  spironolactone (ALDACTONE) 50 MG  tablet Take 1 tablet (50 mg total) by mouth daily. 11/27/16  Yes Mahala Menghini, PA-C  traZODone (DESYREL) 100 MG tablet Take 1 tablet (100 mg total) by mouth at bedtime. Patient taking differently: Take 150 mg by mouth at bedtime as needed. 150 mg at bedtime 04/21/16  Yes Pucilowska, Jolanta B, MD  vitamin E 400 UNIT capsule Take 400 Units by mouth daily.   Yes [provider]    Physical Exam: Vitals:   06/03/17 1629 06/03/17 1745 06/03/17 1833 06/03/17 1900  BP: (!) 163/89  126/81 (!) 133/91  Pulse: (!) 116 (!) 101 96 96  Resp: 20 (!) 22 (!) 23 16  Temp: 98.7 F (37.1 C)     TempSrc: Oral     SpO2: 96% 97% 99% 100%  Weight: 123.4 kg (272 lb)     Height: 5\' 3"  (1.6  m)       Constitutional: NAD, calm, comfortable Vitals:   06/03/17 1629 06/03/17 1745 06/03/17 1833 06/03/17 1900  BP: (!) 163/89  126/81 (!) 133/91  Pulse: (!) 116 (!) 101 96 96  Resp: 20 (!) 22 (!) 23 16  Temp: 98.7 F (37.1 C)     TempSrc: Oral     SpO2: 96% 97% 99% 100%  Weight: 123.4 kg (272 lb)     Height: 5\' 3"  (1.6 m)      Eyes: PERRL, lids and conjunctivae normal ENMT: Mucous membranes are moist. Posterior pharynx clear of any exudate or lesions.Normal dentition.  Neck: normal, supple, no masses, no thyromegaly Respiratory: clear to auscultation bilaterally, no wheezing, no crackles. Normal respiratory effort. No accessory muscle use.  Cardiovascular: Regular rate and rhythm, no murmurs / rubs / gallops. Trace bilat pitting pedal edema. 2+ pedal pulses.  Abdomen: no tenderness, no masses palpated. No hepatosplenomegaly. Bowel sounds positive.  Musculoskeletal: no clubbing / cyanosis. No joint deformity upper and lower extremities. Good ROM, no contractures. Normal muscle tone.  Skin: no rashes, lesions, ulcers. No induration Neurologic: CN 2-12 grossly intact. Strength 5/5 in all 4.  Psychiatric: Normal judgment and insight. Alert and oriented x 3. Normal mood.   Labs on Admission: I have  personally reviewed following labs and imaging studies  CBC: Recent Labs  Lab 05/30/17 0901 06/01/17 0945 06/03/17 1708  WBC 5.5  --  5.4  NEUTROABS 3,806  --  2.9  HGB 6.4* 6.7* 6.3*  HCT 20.3* 22.9* 21.5*  MCV 82.2  --  84.0  PLT 87*  --  347*   Basic Metabolic Panel: Recent Labs  Lab 05/30/17 0901 06/03/17 1708  NA 138 142  K 3.6 3.6  CL 103 107  CO2 27 24  GLUCOSE 106* 108*  BUN 9 9  CREATININE 1.00 1.03*  CALCIUM 8.5* 8.6*   Liver Function Tests: Recent Labs  Lab 05/30/17 0901 06/03/17 1708  AST 44* 137*  ALT 29 79*  ALKPHOS  --  107  BILITOT 1.1 1.0  PROT 7.2 7.7  ALBUMIN  --  3.3*   Recent Labs  Lab 05/30/17 0901  LIPASE 15   Coagulation Profile: Recent Labs  Lab 05/30/17 0901 06/03/17 1708  INR 1.1 1.16   Radiological Exams on Admission: No results found.  EKG: Independently reviewed.  Sinus tachycardia unchanged from prior.  Assessment/Plan Principal Problem:   Symptomatic anemia Active Problems:   Essential hypertension   Alcoholic cirrhosis of liver with ascites (HCC)   Severe recurrent major depression without psychotic features (HCC)   IDA (iron deficiency anemia)   Esophageal varices without bleeding (HCC)    Symptomatic anemia- hgb 6.3, 2/2 uterine fibroids with menorrhagia.  Last transfusion for anemia >1 yr ago.  Worsening menorrhagia.  Megace is not helping. platelets137.  Appears patient may now be open to hysterectomy.  -Gynecology consult inpatient-order placed -Transfuse 2 U PRBC -Continue home Lasix 40 twice daily - CBC a.m -Continue daily iron supplements  Alcoholic liver cirrhosis with ascites, portal hypertension with esophageal varices-high risk GI bleed also, but menorrhagia appears to be etiology for her anemia.  Patient follows with GI.  -Continue home Lasix spironolactone 50 propranolol 10 TID. - CIWA -Thiamine multivitamins  Depression-appears stable -Continue trazodone  HIV as part of routine health  screening   DVT prophylaxis: SCDs Code Status: Full Family Communication: None at bedside Disposition Plan: 1-2 days Consults called: Gynecology Admission status: obs, med surg  Bethena Roys MD [facility] Pager [phone] From 3PM-11PM.  Otherwise please contact night-coverage [email or web address] Password [facility]  06/03/2017, 7:35 PM

## 2017-06-03 NOTE — ED Provider Notes (Signed)
[facility] EMERGENCY DEPARTMENT Provider Note   CSN: [id number] Arrival date & time: 06/03/17  1625     History   Chief Complaint Chief Complaint  Patient presents with  . Shortness of Breath  . Vaginal Bleeding  . Weakness    HPI Whitney Bush is a 46 y.o. female.  HPI Patient presents with lightheadedness and dizziness with known anemia due to vaginal bleeding.  States she was scheduled to have her blood transfusion tomorrow but she is too weak to have it done.  States she is been passing vaginal clots for around 2 years.  He has seen Dr. Glo Herring for it.  Patient states she would potentially have to go down to [facility] to have a surgery done however reviewing records it appears that she has refused surgery.  Has had an endometrial biopsy and has known uterine fibroids.  She had been postmenopausal for around 3 years but then began to have bleeding.  States she is short of breath.  Have lightheadedness dizziness and will even pass out at times.  No swelling in her legs.  Has a history of alcoholic cirrhosis. Past Medical History:  Diagnosis Date  . Alcohol abuse   . 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 Anemia   . Blood transfusion without reported diagnosis   . Boil 06/04/2015  . Chronic kidney disease   . Depression   . GERD (gastroesophageal reflux disease)   . GI (gastrointestinal bleed) 07/28/2015  . Heart murmur   . Hyperlipidemia   . Hypertension   . Hypothyroidism   . Migraines   . OCD (obsessive compulsive disorder)   . Peripheral edema   . PTSD (post-traumatic stress disorder)   . Tubular adenoma     Patient Active Problem List   Diagnosis Date Noted  . Abnormal uterine bleeding (AUB) 02/12/2017  . Acute blood loss anemia 11/27/2016  . Thickened endometrium 11/23/2016  . Uterine leiomyoma 11/23/2016  . Menorrhagia with regular cycle 11/23/2016  . Alcoholic cirrhosis of liver ([place]) 07/07/2016  . Esophageal dysphagia  07/07/2016  . Esophageal varices without bleeding ([place]) 07/07/2016  . IDA (iron deficiency anemia) 06/09/2016  . PTSD (post-traumatic stress disorder) 04/20/2016  . OCD (obsessive compulsive disorder) 04/20/2016  . Severe recurrent major depression without psychotic features ([place]) 04/19/2016  . GERD (gastroesophageal reflux disease) 03/09/2016  . Depression 02/07/2016  . Bilateral edema of lower extremity   . History of colonic polyps   . Alcoholic cirrhosis of liver with ascites ([place])   . Anasarca 02/16/2015  . Alcoholic hepatitis with ascites   . Ascites   . Bleeding gastrointestinal   . AKI (acute kidney injury) ([place]) 12/10/2014  . Volume overload 12/10/2014  . Alcoholic cirrhosis ([place])   . Elevated bilirubin   . Nausea with vomiting   . Diarrhea   . Absolute anemia 12/02/2014  . Jaundice 12/02/2014  . Essential hypertension 12/02/2014  . Alcohol use disorder, moderate, dependence ([place]) 12/02/2014  . Abdominal pain 12/02/2014    Past Surgical History:  Procedure Laterality Date  . ABCESS DRAINAGE     x5; neck, arm, chest, back  . BIOPSY  02/24/2015   Procedure: BIOPSY;  Surgeon: Danie Binder, MD;  Location: [facility];  Service: Endoscopy;;  gastric bx's  . BIOPSY  12/25/2016   Procedure: BIOPSY;  Surgeon: Daneil Dolin, MD;  Location: [facility];  Service: Endoscopy;;  gastric  . CESAREAN SECTION    .  COLONOSCOPY WITH PROPOFOL N/A 06/28/2015   Dr.Rourk- inadequate prep- One 6 mm polyp at the splenic flexure bx= tubular adenoma  . ESOPHAGEAL BANDING N/A 12/25/2016   Procedure: ESOPHAGEAL BANDING with Propofol;  Surgeon: Daneil Dolin, MD;  Location: [facility];  Service: Endoscopy;  Laterality: N/A;  possible banding of varices  . ESOPHAGOGASTRODUODENOSCOPY (EGD) WITH PROPOFOL N/A 02/24/2015   SLF: Grade II esophageal varices Moderate portal hypertensive gastropathy Mild erosive gastritis anemia likely due to many factors: gastritis, gastropathy,  coagulopathy, chronic disease  . ESOPHAGOGASTRODUODENOSCOPY (EGD) WITH PROPOFOL N/A 12/25/2016   Procedure: ESOPHAGOGASTRODUODENOSCOPY (EGD) WITH PROPOFOL;  Surgeon: Daneil Dolin, MD;  [facility];  Service: Endoscopy;  Laterality: N/A;  8:45am  . MALONEY DILATION N/A 12/25/2016   Procedure: Venia Minks DILATION;  Surgeon: Daneil Dolin, MD;  [facility]: [facility];  Service: Endoscopy;  Laterality: N/A;  . POLYPECTOMY  06/28/2015   Procedure: POLYPECTOMY;  Surgeon: Daneil Dolin, MD;  [facility];  Service: Endoscopy;;  at splenic flexure     OB History    Gravida  2   Para  1   Term  1   Preterm      AB  1   Living  1     SAB      TAB      Ectopic      Multiple      Live Births  1            Home Medications    Prior to Admission medications   Medication Sig Start Date End Date Taking? Authorizing Provider  acetaminophen (TYLENOL) 500 MG tablet Take 1,000 mg by mouth every 6 (six) hours as needed for headache.   Yes [provider]  albuterol (PROVENTIL HFA;VENTOLIN HFA) 108 (90 Base) MCG/ACT inhaler Inhale 1-2 puffs into the lungs every 6 (six) hours as needed for wheezing or shortness of breath. 05/03/17  Yes Nat Christen, MD  B Complex-C (SUPER B COMPLEX PO) Take 1 tablet by mouth daily.    Yes [provider]  Cholecalciferol (VITAMIN D PO) Take 1 tablet by mouth once a week.    Yes [provider]  Ferrous Sulfate (IRON) 325 (65 Fe) MG TABS Take 1 tablet by mouth daily.   Yes [provider]  furosemide (LASIX) 40 MG tablet Take 40 mg by mouth 2 (two) times daily.    Yes [provider]  lactulose (CONSTULOSE) 10 GM/15ML solution Take 30 g by mouth 3 (three) times daily.   Yes [provider]  megestrol (MEGACE) 40 MG tablet Take 40 mg by mouth 3 (three) times daily.   Yes [provider]  predniSONE (DELTASONE) 10 MG tablet Take 1 tablet (10 mg total) by mouth daily with  breakfast. 3 tablets for 3 days, 2 tablets for 3 days, 1 tablet for 3 days 05/03/17  Yes Nat Christen, MD  propranolol (INDERAL) 10 MG tablet Take 1 tablet (10 mg total) by mouth 3 (three) times daily. Patient taking differently: Take 10 mg by mouth daily.  04/20/16  Yes Pucilowska, Jolanta B, MD  spironolactone (ALDACTONE) 50 MG tablet Take 1 tablet (50 mg total) by mouth daily. 11/27/16  Yes Mahala Menghini, PA-C  traZODone (DESYREL) 100 MG tablet Take 1 tablet (100 mg total) by mouth at bedtime. Patient taking differently: Take 150 mg by mouth at bedtime as needed. 150 mg at bedtime 04/21/16  Yes Pucilowska, Wardell Honour, MD  vitamin E 400 UNIT capsule Take 400 Units by mouth daily.   Yes [provider]    Family History Family History  Problem Relation Age of Onset  . Diabetes Father   . Hyperlipidemia Father   . Alcohol abuse Father   . Cancer Other   . Cervical cancer Maternal Grandmother   . Lung cancer Maternal Grandfather   . Alcohol abuse Other        multiple family members  . Diabetes Sister   . Hypertension Brother   . Colon cancer Neg Hx   . Liver disease Neg Hx     Social History Social History   Tobacco Use  . Smoking status: Former Smoker    Packs/day: 1.00    Years: 5.00    Pack years: 5.00    Types: Cigarettes    Last attempt to quit: 12/29/2010    Years since quitting: 6.4  . Smokeless tobacco: Former Systems developer    Types: Snuff  Substance Use Topics  . Alcohol use: Yes    Comment: "sometimes"  . Drug use: No     Allergies   Patient has no known allergies.   Review of Systems Review of Systems  Constitutional: Positive for appetite change and fatigue.  HENT: Negative for congestion.   Respiratory: Positive for shortness of breath.   Cardiovascular: Negative for chest pain.  Gastrointestinal: Positive for abdominal pain.  Genitourinary: Positive for vaginal bleeding.  Musculoskeletal: Negative for back pain.  Skin: Negative for rash.    Neurological: Positive for light-headedness.  Hematological: Negative for adenopathy.  Psychiatric/Behavioral: Negative for confusion.     Physical Exam Updated Vital Signs BP (!) 163/89 (BP Location: Right Arm)   Pulse (!) 101   Temp 98.7 F (37.1 C) (Oral)   Resp (!) 22   Ht 5\' 3"  (1.6 m)   Wt 123.4 kg (272 lb)   SpO2 97%   BMI 48.18 kg/m   Physical Exam  Constitutional: She appears well-developed.  HENT:  Head: Normocephalic.  Eyes: EOM are normal.  Neck: Neck supple.  Cardiovascular:  Tachycardia  Pulmonary/Chest: She exhibits no tenderness.  No wheezes rales or rhonchi  Abdominal: Soft.  Patient is obese and somewhat difficult examination.  Mild lower abdominal tenderness.  Neurological: She is alert.  Skin: Skin is warm. Capillary refill takes less than 2 seconds.  Psychiatric: She has a normal mood and affect.     ED Treatments / Results  Labs (all labs ordered are listed, but only abnormal results are displayed) Labs Reviewed  COMPREHENSIVE METABOLIC PANEL - Abnormal; Notable for the following components:      Result Value   Glucose, Bld 108 (*)    Creatinine, Ser 1.03 (*)    Calcium 8.6 (*)    Albumin 3.3 (*)    AST 137 (*)    ALT 79 (*)    All other components within normal limits  CBC WITH DIFFERENTIAL/PLATELET - Abnormal; Notable for the following components:   RBC 2.56 (*)    Hemoglobin 6.3 (*)    HCT 21.5 (*)    MCH 24.6 (*)    MCHC 29.3 (*)    RDW 17.8 (*)    Platelets 137 (*)    All other components within normal limits  PROTIME-INR  TYPE AND SCREEN    EKG EKG Interpretation  Date/Time:  Sunday June 03 2017 16:39:31 EDT Ventricular Rate:  118 PR Interval:    QRS Duration: 71 QT Interval:  307 QTC Calculation:  431 R Axis:   49 Text Interpretation:  Sinus tachycardia Low voltage, precordial leads Borderline T wave abnormalities Baseline wander in lead(s) II V5 Confirmed by Davonna Belling [phone]) on 06/03/2017 5:00:54  PM   Radiology No results found.  Procedures Procedures (including critical care time)  Medications Ordered in ED Medications - No data to display   Initial Impression / Assessment and Plan / ED Course  I have reviewed the triage vital signs and the nursing notes.  Pertinent labs & imaging results that were available during my care of the patient were reviewed by me and considered in my medical decision making (see chart for details).     Patient with anemia due to vaginal bleeding.  Vaginal bleeding is been worked up by Dr. Glo Herring.  Was due to get transfusion tomorrow but states she cannot wait that long.  More shortness of breath.  More fatigue.  Hemoglobin is 6.3.  Will admit to hospitalist for transfusion.  Final Clinical Impressions(s) / ED Diagnoses   Final diagnoses:  Anemia, unspecified type  Vaginal bleeding    ED Discharge Orders    None       Davonna Belling, MD 06/03/17 1806

## 2017-06-03 NOTE — ED Notes (Signed)
Pt reports increased vaginal bleeding. Reports she has been told she needs a hysterectomy. Feels weak and SOB

## 2017-06-03 NOTE — ED Notes (Signed)
Date and time results received: 06/03/17 1727   Test: Hemoglobin Critical Value: 6.3  Name of Provider Notified: Dr. Alvino Chapel  Orders Received? Or Actions Taken?: No new orders given

## 2017-06-03 NOTE — ED Triage Notes (Signed)
Pt states that she has been sob for weeks and she is also having thyroid tumors she is having heavy bleeding. She is weak from this

## 2017-06-04 ENCOUNTER — Encounter (HOSPITAL_COMMUNITY): Payer: Self-pay

## 2017-06-04 ENCOUNTER — Encounter (HOSPITAL_COMMUNITY): Payer: Medicaid Other

## 2017-06-04 DIAGNOSIS — I8510 Secondary esophageal varices without bleeding: Secondary | ICD-10-CM | POA: Diagnosis not present

## 2017-06-04 DIAGNOSIS — I10 Essential (primary) hypertension: Secondary | ICD-10-CM

## 2017-06-04 DIAGNOSIS — D500 Iron deficiency anemia secondary to blood loss (chronic): Secondary | ICD-10-CM

## 2017-06-04 DIAGNOSIS — K7031 Alcoholic cirrhosis of liver with ascites: Secondary | ICD-10-CM

## 2017-06-04 LAB — TYPE AND SCREEN
ABO/RH(D): A POS
ANTIBODY SCREEN: NEGATIVE
UNIT DIVISION: 0
Unit division: 0

## 2017-06-04 LAB — BPAM RBC
BLOOD PRODUCT EXPIRATION DATE: 201904262359
Blood Product Expiration Date: 201904262359
UNIT TYPE AND RH: 600
Unit Type and Rh: 600

## 2017-06-04 LAB — CBC
HCT: 25.5 % — ABNORMAL LOW (ref 36.0–46.0)
Hemoglobin: 7.7 g/dL — ABNORMAL LOW (ref 12.0–15.0)
MCH: 25.9 pg — AB (ref 26.0–34.0)
MCHC: 30.2 g/dL (ref 30.0–36.0)
MCV: 85.9 fL (ref 78.0–100.0)
PLATELETS: 110 10*3/uL — AB (ref 150–400)
RBC: 2.97 MIL/uL — AB (ref 3.87–5.11)
RDW: 17 % — ABNORMAL HIGH (ref 11.5–15.5)
WBC: 4.3 10*3/uL (ref 4.0–10.5)

## 2017-06-04 LAB — PREPARE RBC (CROSSMATCH)

## 2017-06-04 MED ORDER — MEGESTROL ACETATE 40 MG PO TABS: 80.0000 mg | ORAL_TABLET | Freq: Three times a day (TID) | ORAL | 0 refills | Status: AC

## 2017-06-04 MED ORDER — ADULT MULTIVITAMIN W/MINERALS CH: 1.0000 | ORAL_TABLET | Freq: Every day | ORAL | Status: DC

## 2017-06-04 MED ORDER — LORAZEPAM 1 MG PO TABS: 1.0000 mg | ORAL_TABLET | Freq: Four times a day (QID) | ORAL | 0 refills | Status: DC | PRN

## 2017-06-04 MED ORDER — FOLIC ACID 1 MG PO TABS: 1.0000 mg | ORAL_TABLET | Freq: Every day | ORAL | 0 refills | Status: AC

## 2017-06-04 MED ORDER — MEGESTROL ACETATE 40 MG PO TABS
80.0000 mg | ORAL_TABLET | Freq: Three times a day (TID) | ORAL | Status: DC
  Administered 2017-06-04: 80 mg via ORAL
  Filled 2017-06-04 (×6): qty 2

## 2017-06-04 MED ORDER — THIAMINE HCL 100 MG PO TABS: 100.0000 mg | ORAL_TABLET | Freq: Every day | ORAL | 0 refills | Status: AC

## 2017-06-04 MED ORDER — SODIUM CHLORIDE 0.9 % IV SOLN: Freq: Once | INTRAVENOUS | Status: DC

## 2017-06-04 NOTE — Progress Notes (Signed)
PROGRESS NOTE    [name]  DGU:[id number]  DOB: May 01, 1970  DOA: 06/03/2017 PCP: [facility] Personal   Brief Admission Hx: Whitney Bush is a 47 y.o. female with medical history significant for Alcoholic liver cirrhosis, esophageal varices, depression, iron deficiency anemia, uterine leiomyomas, who presented to the ED with complaints of shortness of breath and generalized weakness worse over the past few days.  Patient saw her primary care provider, hemoglobin was low at 6.7, patient was called about 3 days ago to come in for transfusion tomorrow- 06/04/17, but she felt too weak and short of breath to wait until tomorrow hence presentation in the ED today.  Patient reports chronic menorrhagia, but over the past year she has been seeing her period Every day, with blood clots.  She has been seen by gynecology, was placed on Megace which patient states is not helping, per chart it appears she has declined hysterectomy.  Patient reports menorrhagia is worsening.  She is followed by Dr. Glo Herring.     MDM/Assessment & Plan:   1. Symptomatic anemia - secondary to menorrhagia - She is s/p 2 units PRBC and is feeling a lot better.  She continues to bleed, using about 5 pads per day.   Continue daily iron supplements and GYN consult pending.  2. Alcoholic cirrhosis with esophageal varices - Pt denies having any symptoms of GI bleeding at this time.   3. Depression - stable on trazodone.    DVT prophylaxis: SCDs Code Status: Full  Family Communication: husband at bedside Disposition Plan: home after Gyn consult today   Subjective: Pt says that she is feeling better after blood transfusion.     Objective: Vitals:   06/03/17 2359 06/04/17 0145 06/04/17 0410 06/04/17 0600  BP: 126/72 111/60 90/68 117/74  Pulse: 92 91 91 83  Resp: 14 18 18 16   Temp: 98.2 F (36.8 C) 98.6 F (37 C) 99.9 F (37.7 C)   TempSrc: Oral Oral Oral   SpO2: 94% 97% 98%   Weight:       Height:        Intake/Output Summary (Last 24 hours) at 06/04/2017 0855 Last data filed at 06/04/2017 0145 Gross per 24 hour  Intake 773.83 ml  Output 200 ml  Net 573.83 ml   Filed Weights   06/03/17 1629 06/03/17 2205  Weight: 123.4 kg (272 lb) 125.5 kg (276 lb 10.8 oz)     REVIEW OF SYSTEMS  As per history otherwise all reviewed and reported negative  Exam:  General exam: awake, alert, NAD, cooperative.   Respiratory system: Clear. No increased work of breathing. Cardiovascular system: S1 & S2 heard, RRR. No JVD, murmurs, gallops, clicks or pedal edema. Gastrointestinal system: Abdomen is nondistended, soft and nontender. Normal bowel sounds heard. Central nervous system: Alert and oriented. No focal neurological deficits. Extremities: no CCE.  Data Reviewed: Basic Metabolic Panel: Recent Labs  Lab 05/30/17 0901 06/03/17 1708  NA 138 142  K 3.6 3.6  CL 103 107  CO2 27 24  GLUCOSE 106* 108*  BUN 9 9  CREATININE 1.00 1.03*  CALCIUM 8.5* 8.6*   Liver Function Tests: Recent Labs  Lab 05/30/17 0901 06/03/17 1708  AST 44* 137*  ALT 29 79*  ALKPHOS  --  107  BILITOT 1.1 1.0  PROT 7.2 7.7  ALBUMIN  --  3.3*   Recent Labs  Lab 05/30/17 0901  LIPASE 15   No results for input(s): AMMONIA in the  last 168 hours. CBC: Recent Labs  Lab 05/30/17 0901 06/01/17 0945 06/03/17 1708 06/04/17 0515  WBC 5.5  --  5.4 4.3  NEUTROABS 3,806  --  2.9  --   HGB 6.4* 6.7* 6.3* 7.7*  HCT 20.3* 22.9* 21.5* 25.5*  MCV 82.2  --  84.0 85.9  PLT 87*  --  137* 110*   Cardiac Enzymes: No results for input(s): CKTOTAL, CKMB, CKMBINDEX, TROPONINI in the last 168 hours. CBG (last 3)  No results for input(s): GLUCAP in the last 72 hours. No results found for this or any previous visit (from the past 240 hour(s)).   Studies: No results found.   Scheduled Meds: . ferrous sulfate  325 mg Oral Daily  . folic acid  1 mg Oral Daily  . furosemide  40 mg Oral BID  . lactulose   30 g Oral TID  . LORazepam  0-4 mg Oral Q6H   Followed by  . [START ON 06/05/2017] LORazepam  0-4 mg Oral Q12H  . megestrol  40 mg Oral TID  . multivitamin with minerals  1 tablet Oral Daily  . propranolol  10 mg Oral TID  . spironolactone  50 mg Oral Daily  . thiamine  100 mg Oral Daily   Or  . thiamine  100 mg Intravenous Daily  . traZODone  100 mg Oral QHS   Continuous Infusions:  Principal Problem:   Symptomatic anemia Active Problems:   Essential hypertension   Alcoholic cirrhosis of liver with ascites (HCC)   Severe recurrent major depression without psychotic features (HCC)   IDA (iron deficiency anemia)   Esophageal varices without bleeding ([place])  Time spent:   Irwin Brakeman, MD, FAAFP [facility] Pager [phone]  If 7PM-7AM, please contact night-coverage [email or web address] Password [email or web address] 06/04/2017, 8:55 AM    LOS: 0 days

## 2017-06-04 NOTE — Progress Notes (Signed)
Pt arrived to 309 via stretcher from ED. PRBC infusing. Oriented to room and equipment. PAS and skin swarm completed. No skin issues noted. VSS. C/o 8/10 headache. Will check for PRN meds. Family at bedside. No needs voiced at this time.

## 2017-06-04 NOTE — Progress Notes (Signed)
Patient discharged home with personal belongings, IV removed and site intact. Patient also discharged with prescriptions.

## 2017-06-04 NOTE — Discharge Summary (Signed)
Physician Discharge Summary  JOANIE DUPREY GNF:[id number] DOB: 1970/03/12 DOA: 06/03/2017  PCP: [facility] Personal GYN: Dr. Glo Herring  Admit date: 06/03/2017 Discharge date: 06/04/2017  Admitted From: Home  Disposition: Home   Recommendations for Outpatient Follow-up:  1. Follow up with Dr. Glo Herring this week as scheduled 2. Please obtain BMP/CBC in one week 3. Please avoid alcohol useage  Discharge Condition: STABLE   CODE STATUS: FULL    Brief Hospitalization Summary: Please see all hospital notes, images, labs for full details of the hospitalization. HPI: Whitney Bush is a 47 y.o. female with medical history significant for Alcoholic liver cirrhosis, esophageal varices, depression, iron deficiency anemia, uterine leiomyomas, who presented to the ED with complaints of shortness of breath and generalized weakness worse over the past few days.  Patient saw her primary care provider, hemoglobin was low at 6.7, patient was called about 3 days ago to come in for transfusion tomorrow- 06/04/17, but she felt too weak and short of breath to wait until tomorrow hence presentation in the ED today. Patient reports chronic menorrhagia, but over the past year she has been seeing her period Every day, with blood clots.  She has been seen by gynecology, was placed on Megace which patient states is not helping, per chart it appears she has declined hysterectomy.  Patient reports menorrhagia is worsening. She denies NSAID use.  No abdominal pain or vomiting.  Endorses chronic bilateral lower extremity swelling.   ED Course: Initial mild tachycardia- 116, otherwise stable vitals.  Hemoglobin low 6.3, platelets 137.  AST ALT elevated- 137, 79, expectedly, albumin low 3.3.  EKG shows sinus tachycardia unchanged from prior.  Hospitalist was called to admit for symptomatic anemia, 2U PRBC ordered in ED.  Brief Admission Hx: Whitney D Brownis a 47 y.o.femalewith medical history  significantfor Alcoholic liver cirrhosis,esophageal varices,depression,iron deficiency anemia,uterine leiomyomas,who presented to the ED with complaints of shortness of breath and generalized weakness worse over the past few days.Patient saw her primary care provider,hemoglobin was low at 6.7, patientwas called about 3 days ago tocome infor transfusion tomorrow- 06/04/17,but she felt too weak and short of breath to wait until tomorrow hence presentation in the ED today.  Patient reports chronic menorrhagia,but over the past year she has been seeing her periodEvery day,with blood clots.She has been seen by gynecology,was placed on Megace which patient states is not helping,per chart it appears she has declined hysterectomy.Patient reports menorrhagia is worsening.  She is followed by Dr. Glo Herring.     MDM/Assessment & Plan:   1. Symptomatic anemia - secondary to menorrhagia - She is s/p 2 units PRBC and is feeling a lot better.  She continues to bleed, using about 5 pads per day.   Continue daily iron supplements and GYN consulted Dr. Glo Herring and she will be following up with him later this week for consideration of hysterectomy.  Increased the medrol to 80 mg TID and will have repeat CBC testing done later this week.  She was given an additional 2 units of PRBC prior to discharge today for a total of 4 units PRBC since admission.  2. Alcoholic cirrhosis with esophageal varices - Pt denies having any symptoms of GI bleeding at this time.   3. Depression - stable on trazodone.  4. Chronic alcoholism - Pt has said that she has stopped drinking.  She was given some lorazepam tablets to help alleviate some of her withdrawal symptoms.  If her symptoms become severe she was advised  to seek medical care or return to ED. She was advised not to drink any alcoholic beverages if taking lorazepam and to avoid driving or operating machinery.  Pt verbalized understanding.    DVT prophylaxis:  SCDs Code Status: Full  Family Communication: husband at bedside Disposition Plan: home after blood transfusion  Discharge Diagnoses:  Principal Problem:   Symptomatic anemia Active Problems:   Essential hypertension   Alcoholic cirrhosis of liver with ascites (HCC)   Severe recurrent major depression without psychotic features (HCC)   IDA (iron deficiency anemia)   Esophageal varices without bleeding [facility])  Discharge Instructions: Discharge Instructions    Call MD for:  difficulty breathing, headache or visual disturbances   Complete by:  As directed    Call MD for:  extreme fatigue   Complete by:  As directed    Call MD for:  persistant dizziness or light-headedness   Complete by:  As directed    Diet - low sodium heart healthy   Complete by:  As directed    Increase activity slowly   Complete by:  As directed      Allergies as of 06/04/2017   No Known Allergies     Medication List    STOP taking these medications   predniSONE 10 MG tablet Commonly known as:  DELTASONE     TAKE these medications   acetaminophen 500 MG tablet Commonly known as:  TYLENOL Take 1,000 mg by mouth every 6 (six) hours as needed for headache.   albuterol 108 (90 Base) MCG/ACT inhaler Commonly known as:  PROVENTIL HFA;VENTOLIN HFA Inhale 1-2 puffs into the lungs every 6 (six) hours as needed for wheezing or shortness of breath.   CONSTULOSE 10 GM/15ML solution Generic drug:  lactulose Take 30 g by mouth 3 (three) times daily.   folic acid 1 MG tablet Commonly known as:  FOLVITE Take 1 tablet (1 mg total) by mouth daily. Start taking on:  06/05/2017   furosemide 40 MG tablet Commonly known as:  LASIX Take 40 mg by mouth 2 (two) times daily.   Iron 325 (65 Fe) MG Tabs Take 1 tablet by mouth daily.   LORazepam 1 MG tablet Commonly known as:  ATIVAN Take 1 tablet (1 mg total) by mouth every 6 (six) hours as needed for anxiety (anxiety, alcohol withdrawal).   megestrol 40 MG  tablet Commonly known as:  MEGACE Take 2 tablets (80 mg total) by mouth 3 (three) times daily for 14 days. What changed:  how much to take   multivitamin with minerals Tabs tablet Take 1 tablet by mouth daily. Start taking on:  06/05/2017   propranolol 10 MG tablet Commonly known as:  INDERAL Take 1 tablet (10 mg total) by mouth 3 (three) times daily. What changed:  when to take this   spironolactone 50 MG tablet Commonly known as:  ALDACTONE Take 1 tablet (50 mg total) by mouth daily.   SUPER B COMPLEX PO Take 1 tablet by mouth daily.   thiamine 100 MG tablet Take 1 tablet (100 mg total) by mouth daily. Start taking on:  06/05/2017   traZODone 100 MG tablet Commonly known as:  DESYREL Take 1 tablet (100 mg total) by mouth at bedtime. What changed:    how much to take  when to take this  reasons to take this  additional instructions   VITAMIN D PO Take 1 tablet by mouth once a week.   vitamin E 400 UNIT capsule Take 400 Units  by mouth daily.      Follow-up Information    Jonnie Kind, MD. Go on 06/08/2017.   Specialties:  Obstetrics and Gynecology, Radiology Why:  Follow Up for vaginal bleeding Contact information: [place] [id number] [phone]          No Known Allergies Allergies as of 06/04/2017   No Known Allergies     Medication List    STOP taking these medications   predniSONE 10 MG tablet Commonly known as:  DELTASONE     TAKE these medications   acetaminophen 500 MG tablet Commonly known as:  TYLENOL Take 1,000 mg by mouth every 6 (six) hours as needed for headache.   albuterol 108 (90 Base) MCG/ACT inhaler Commonly known as:  PROVENTIL HFA;VENTOLIN HFA Inhale 1-2 puffs into the lungs every 6 (six) hours as needed for wheezing or shortness of breath.   CONSTULOSE 10 GM/15ML solution Generic drug:  lactulose Take 30 g by mouth 3 (three) times daily.   folic acid 1 MG tablet Commonly known as:   FOLVITE Take 1 tablet (1 mg total) by mouth daily. Start taking on:  06/05/2017   furosemide 40 MG tablet Commonly known as:  LASIX Take 40 mg by mouth 2 (two) times daily.   Iron 325 (65 Fe) MG Tabs Take 1 tablet by mouth daily.   LORazepam 1 MG tablet Commonly known as:  ATIVAN Take 1 tablet (1 mg total) by mouth every 6 (six) hours as needed for anxiety (anxiety, alcohol withdrawal).   megestrol 40 MG tablet Commonly known as:  MEGACE Take 2 tablets (80 mg total) by mouth 3 (three) times daily for 14 days. What changed:  how much to take   multivitamin with minerals Tabs tablet Take 1 tablet by mouth daily. Start taking on:  06/05/2017   propranolol 10 MG tablet Commonly known as:  INDERAL Take 1 tablet (10 mg total) by mouth 3 (three) times daily. What changed:  when to take this   spironolactone 50 MG tablet Commonly known as:  ALDACTONE Take 1 tablet (50 mg total) by mouth daily.   SUPER B COMPLEX PO Take 1 tablet by mouth daily.   thiamine 100 MG tablet Take 1 tablet (100 mg total) by mouth daily. Start taking on:  06/05/2017   traZODone 100 MG tablet Commonly known as:  DESYREL Take 1 tablet (100 mg total) by mouth at bedtime. What changed:    how much to take  when to take this  reasons to take this  additional instructions   VITAMIN D PO Take 1 tablet by mouth once a week.   vitamin E 400 UNIT capsule Take 400 Units by mouth daily.       Procedures/Studies: US Abdomen Limited Ruq  Result Date: 05/18/2017 CLINICAL DATA:  Cirrhosis EXAM: ULTRASOUND ABDOMEN LIMITED RIGHT UPPER QUADRANT COMPARISON:  11/29/2016 FINDINGS: Gallbladder: Gallbladder is well distended with dependent gallstones similar to that seen on the prior exam. No wall thickening or pericholecystic fluid is noted. Common bile duct: Diameter: 4.4 mm. Liver: Diffusely heterogeneous similar to that seen on the prior exam. The cystic lesion in the left lobe is again identified measures 1.2  cm. It is mildly complicated but stable from the prior exam. Portal vein is patent on color Doppler imaging with normal direction of blood flow towards the liver. IMPRESSION: Stable cholelithiasis. Stable heterogeneity within the liver consistent with the given clinical history of cirrhosis. Stable mildly complicated hepatic cyst.  Electronically Signed   By: Inez Catalina M.D.   On: 05/18/2017 13:51      Subjective: Pt feels much better after blood transfusions.  She still has vaginal bleeding.  She says she will follow up and see Dr. Glo Herring later this week and will stop drinking alcohol.   Discharge Exam: Vitals:   06/04/17 1511 06/04/17 1541  BP: 134/80 126/84  Pulse: 81 84  Resp: 18 18  Temp: 99 F (37.2 C) 98.4 F (36.9 C)  SpO2: 100% 100%   Vitals:   06/04/17 1210 06/04/17 1240 06/04/17 1511 06/04/17 1541  BP: 127/90 111/76 134/80 126/84  Pulse: 82 83 81 84  Resp: 18 18 18 18   Temp: 99 F (37.2 C) 99.1 F (37.3 C) 99 F (37.2 C) 98.4 F (36.9 C)  TempSrc: Oral Oral Oral Oral  SpO2: 100% 100% 100% 100%  Weight:      Height:       General: Pt is alert, awake, not in acute distress Cardiovascular: RRR, S1/S2 +, no rubs, no gallops Respiratory: CTA bilaterally, no wheezing, no rhonchi Abdominal: Soft, NT, ND, bowel sounds + Extremities: no edema, no cyanosis   The results of significant diagnostics from this hospitalization (including imaging, microbiology, ancillary and laboratory) are listed below for reference.    Microbiology: No results found for this or any previous visit (from the past 240 hour(s)).   Labs: BNP (last 3 results) No results for input(s): BNP in the last 8760 hours. Basic Metabolic Panel: Recent Labs  Lab 05/30/17 0901 06/03/17 1708  NA 138 142  K 3.6 3.6  CL 103 107  CO2 27 24  GLUCOSE 106* 108*  BUN 9 9  CREATININE 1.00 1.03*  CALCIUM 8.5* 8.6*   Liver Function Tests: Recent Labs  Lab 05/30/17 0901 06/03/17 1708  AST 44* 137*   ALT 29 79*  ALKPHOS  --  107  BILITOT 1.1 1.0  PROT 7.2 7.7  ALBUMIN  --  3.3*   Recent Labs  Lab 05/30/17 0901  LIPASE 15   No results for input(s): AMMONIA in the last 168 hours. CBC: Recent Labs  Lab 05/30/17 0901 06/01/17 0945 06/03/17 1708 06/04/17 0515  WBC 5.5  --  5.4 4.3  NEUTROABS 3,806  --  2.9  --   HGB 6.4* 6.7* 6.3* 7.7*  HCT 20.3* 22.9* 21.5* 25.5*  MCV 82.2  --  84.0 85.9  PLT 87*  --  137* 110*   Cardiac Enzymes: No results for input(s): CKTOTAL, CKMB, CKMBINDEX, TROPONINI in the last 168 hours. BNP: Invalid input(s): POCBNP CBG: No results for input(s): GLUCAP in the last 168 hours. D-Dimer No results for input(s): DDIMER in the last 72 hours. Hgb A1c No results for input(s): HGBA1C in the last 72 hours. Lipid Profile No results for input(s): CHOL, HDL, LDLCALC, TRIG, CHOLHDL, LDLDIRECT in the last 72 hours. Thyroid function studies No results for input(s): TSH, T4TOTAL, T3FREE, THYROIDAB in the last 72 hours.  Invalid input(s): FREET3 Anemia work up No results for input(s): VITAMINB12, FOLATE, FERRITIN, TIBC, IRON, RETICCTPCT in the last 72 hours. Urinalysis    Component Value Date/Time   COLORURINE YELLOW 07/27/2015 1817   APPEARANCEUR CLEAR 07/27/2015 1817   LABSPEC 1.010 07/27/2015 1817   PHURINE 5.5 07/27/2015 1817   GLUCOSEU NEGATIVE 07/27/2015 1817   HGBUR NEGATIVE 07/27/2015 1817   BILIRUBINUR NEGATIVE 07/27/2015 1817   KETONESUR NEGATIVE 07/27/2015 1817   PROTEINUR NEGATIVE 07/27/2015 1817   UROBILINOGEN 0.2 12/11/2014 1222  NITRITE NEGATIVE 07/27/2015 1817   LEUKOCYTESUR NEGATIVE 07/27/2015 1817   Sepsis Labs Invalid input(s): PROCALCITONIN,  WBC,  LACTICIDVEN Microbiology No results found for this or any previous visit (from the past 240 hour(s)).  Time coordinating discharge:   SIGNED:  Irwin Brakeman, MD  [facility] 06/04/2017, 4:40 PM Pager [phone]  If 7PM-7AM, please contact  night-coverage [email or web address] Password [email or web address]

## 2017-06-04 NOTE — Discharge Instructions (Signed)
DO NOT DRINK ALCOHOL.  AVOID ALL ALCOHOLIC BEVERAGES.  DO NOT DRINK ANY ALCOHOL WHILE TAKING LORAZEPAM.  AVOID DRIVING OR OPERATING MACHINERY WHILE TAKING LORAZEPAM.   SEEK MEDICAL CARE OR RETURN TO ER IF SYMPTOMS WORSEN.      Follow with Primary MD  Health, [place]  and other consultants as instructed your Hospitalist MD  Please get a complete blood count and chemistry panel checked by your Primary MD at your next visit, and again as instructed by your Primary MD.  Get Medicines reviewed and adjusted: Please take all your medications with you for your next visit with your Primary MD  Laboratory/radiological data: Please request your Primary MD to go over all hospital tests and procedure/radiological results at the follow up, please ask your Primary MD to get all Hospital records sent to his/her office.  In some cases, they will be blood work, cultures and biopsy results pending at the time of your discharge. Please request that your primary care M.D. follows up on these results.  Also Note the following: If you experience worsening of your admission symptoms, develop shortness of breath, life threatening emergency, suicidal or homicidal thoughts you must seek medical attention immediately by calling 911 or calling your MD immediately  if symptoms less severe.  You must read complete instructions/literature along with all the possible adverse reactions/side effects for all the Medicines you take and that have been prescribed to you. Take any new Medicines after you have completely understood and accpet all the possible adverse reactions/side effects.   Do not drive when taking Pain medications or sleeping medications (Benzodaizepines)  Do not take more than prescribed Pain, Sleep and Anxiety Medications. It is not advisable to combine anxiety,sleep and pain medications without talking with your primary care practitioner  Special Instructions: If you have smoked  or chewed Tobacco  in the last 2 yrs please stop smoking, stop any regular Alcohol  and or any Recreational drug use.  Wear Seat belts while driving.  Please note: You were cared for by a hospitalist during your hospital stay. Once you are discharged, your primary care physician will handle any further medical issues. Please note that NO REFILLS for any discharge medications will be authorized once you are discharged, as it is imperative that you return to your primary care physician (or establish a relationship with a primary care physician if you do not have one) for your post hospital discharge needs so that they can reassess your need for medications and monitor your lab values.   Abnormal Uterine Bleeding Abnormal uterine bleeding means bleeding more than usual from your uterus. It can include:  Bleeding between periods.  Bleeding after sex.  Bleeding that is heavier than normal.  Periods that last longer than usual.  Bleeding after you have stopped having your period (menopause).  There are many problems that may cause this. You should see a doctor for any kind of bleeding that is not normal. Treatment depends on the cause of the bleeding. Follow these instructions at home:  Watch your condition for any changes.  Do not use tampons, douche, or have sex, if your doctor tells you not to.  Change your pads often.  Get regular well-woman exams. Make sure they include a pelvic exam and cervical cancer screening.  Keep all follow-up visits as told by your doctor. This is important. Contact a doctor if:  The bleeding lasts more than one week.  You feel dizzy at times.  You  feel like you are going to throw up (nauseous).  You throw up. Get help right away if:  You pass out.  You have to change pads every hour.  You have belly (abdominal) pain.  You have a fever.  You get sweaty.  You get weak.  You passing large blood clots from your vagina. Summary  Abnormal  uterine bleeding means bleeding more than usual from your uterus.  There are many problems that may cause this. You should see a doctor for any kind of bleeding that is not normal.  Treatment depends on the cause of the bleeding. This information is not intended to replace advice given to you by your health care provider. Make sure you discuss any questions you have with your health care provider. Document Released: 12/11/2008 Document Revised: 02/08/2016 Document Reviewed: 02/08/2016 Elsevier Interactive Patient Education  2017 [employer].   Blood Transfusion A blood transfusion is a procedure in which you are given blood through an IV tube. You may need this procedure because of:  Illness.  Surgery.  Injury.  The blood may come from someone else (a donor). You may also be able to donate blood for yourself (autologous blood donation). The blood given in a transfusion is made up of different types of cells. You may get:  Red blood cells. These carry oxygen to the cells in the body.  White blood cells. These help you fight infections.  Platelets. These help your blood to clot.  Plasma. This is the liquid part of your blood. It helps with fluid imbalances.  If you have a clotting disorder, you may also get other types of blood products. What happens before the procedure?  You will have a blood test to find out your blood type. The test also finds out what type of blood your body will accept and matches it to the donor type.  If you are going to have a planned surgery, you may be able to donate your own blood. This may be done in case you need a transfusion.  If you have had an allergic reaction to a transfusion in the past, you may be given medicine to help prevent a reaction. This medicine may be given to you by mouth or through an IV.  You will have your temperature, blood pressure, and pulse checked.  Follow instructions from your doctor about what you cannot eat or  drink.  Ask your doctor about: ? Changing or stopping your regular medicines. This is important if you take diabetes medicines or blood thinners. ? Taking medicines such as aspirin and ibuprofen. These medicines can thin your blood. Do not take these medicines before your procedure if your doctor tells you not to. What happens during the procedure?  An IV tube will be put into one of your veins.  The bag of donated blood will be attached to your IV tube. Then, the blood will enter through your vein.  Your temperature, blood pressure, and pulse will be checked regularly during the procedure. This is done to find early signs of a transfusion reaction.  If you have any signs or symptoms of a reaction, your transfusion will be stopped. You may also be given medicine.  When the transfusion is done, your IV tube will be taken out.  Pressure may be applied to the IV site for a few minutes.  A bandage (dressing) will be put on the IV site. The procedure may vary among doctors and hospitals. What happens after the procedure?  Your temperature, blood pressure, heart rate, breathing rate, and blood oxygen level will be checked often.  Your blood may be tested to see how you are responding to the transfusion.  You may be warmed with fluids or blankets. This is done to keep the temperature of your body normal. Summary  A blood transfusion is a procedure in which you are given blood through an IV tube.  The blood may come from someone else (a donor). You may also be able to donate blood for yourself.  If you have had an allergic reaction to a transfusion in the past, you may be given medicine to help prevent a reaction. This medicine may be given to you by mouth or through an IV tube.  Your temperature, blood pressure, heart rate, breathing rate, and blood oxygen level will be checked often.  Your blood may be tested to see how you are responding to the transfusion. This information is not  intended to replace advice given to you by your health care provider. Make sure you discuss any questions you have with your health care provider. Document Released: 05/12/2008 Document Revised: 10/08/2015 Document Reviewed: 10/08/2015 Elsevier Interactive Patient Education  2017 [employer]   Menorrhagia Menorrhagia is when your menstrual periods are heavy or last longer than usual. Follow these instructions at home:  Only take medicine as told by your doctor.  Take any iron pills as told by your doctor. Heavy bleeding may cause low levels of iron in your body.  Do not take aspirin 1 week before or during your period. Aspirin can make the bleeding worse.  Lie down for a while if you change your tampon or pad more than once in 2 hours. This may help lessen the bleeding.  Eat a healthy diet and foods with iron. These foods include leafy green vegetables, meat, liver, eggs, and whole grain breads and cereals.  Do not try to lose weight. Wait until the heavy bleeding has stopped and your iron level is normal. Contact a doctor if:  You soak through a pad or tampon every 1 or 2 hours, and this happens every time you have a period.  You need to use pads and tampons at the same time because you are bleeding so much.  You need to change your pad or tampon during the night.  You have a period that lasts for more than 8 days.  You pass clots bigger than 1 inch (2.5 cm) wide.  You have irregular periods that happen more or less often than once a month.  You feel dizzy or pass out (faint).  You feel very weak or tired.  You feel short of breath or feel your heart is beating too fast when you exercise.  You feel sick to your stomach (nausea) and you throw up (vomit) while you are taking your medicine.  You have watery poop (diarrhea) while you are taking your medicine.  You have any problems that may be related to the medicine you are taking. Get help right away if:  You soak  through 4 or more pads or tampons in 2 hours.  You have any bleeding while you are pregnant. This information is not intended to replace advice given to you by your health care provider. Make sure you discuss any questions you have with your health care provider. Document Released: 11/23/2007 Document Revised: 07/22/2015 Document Reviewed: 08/15/2012 Elsevier Interactive Patient Education  2017 [employer].

## 2017-06-05 LAB — BPAM RBC
BLOOD PRODUCT EXPIRATION DATE: 201904262359
Blood Product Expiration Date: 201904262359
Blood Product Expiration Date: 201904282359
Blood Product Expiration Date: 201905032359
ISSUE DATE / TIME: 201904071952
ISSUE DATE / TIME: 201904081520
ISSUE DATE / TIME: 201904072338
ISSUE DATE / TIME: 201904081217
UNIT TYPE AND RH: 600
UNIT TYPE AND RH: 600
UNIT TYPE AND RH: 6200
Unit Type and Rh: 6200

## 2017-06-05 LAB — TYPE AND SCREEN
ABO/RH(D): A POS
Antibody Screen: NEGATIVE
UNIT DIVISION: 0
Unit division: 0
Unit division: 0
Unit division: 0

## 2017-06-05 LAB — HIV ANTIBODY (ROUTINE TESTING W REFLEX): HIV Screen 4th Generation wRfx: NONREACTIVE

## 2017-06-05 NOTE — Progress Notes (Signed)
Patient discharged yesterday with Hgb 7.7  Please contact Dr. Johnnye Sima office and see if they can see her this week for vaginal bleeding requiring blood transfusions.  Patient needs another Hgb on Monday.  Please ask if she is having melena or brbpr.

## 2017-06-06 ENCOUNTER — Other Ambulatory Visit: Payer: Self-pay | Admitting: *Deleted

## 2017-06-06 DIAGNOSIS — N939 Abnormal uterine and vaginal bleeding, unspecified: Secondary | ICD-10-CM

## 2017-06-07 ENCOUNTER — Ambulatory Visit: Payer: Medicaid Other | Admitting: Obstetrics and Gynecology

## 2017-06-07 ENCOUNTER — Encounter: Payer: Self-pay | Admitting: Obstetrics and Gynecology

## 2017-06-07 VITALS — BP 116/70 | HR 76 | Ht 63.0 in | Wt 263.0 lb

## 2017-06-07 DIAGNOSIS — R454 Irritability and anger: Secondary | ICD-10-CM

## 2017-06-07 DIAGNOSIS — D251 Intramural leiomyoma of uterus: Secondary | ICD-10-CM

## 2017-06-07 DIAGNOSIS — D500 Iron deficiency anemia secondary to blood loss (chronic): Secondary | ICD-10-CM | POA: Insufficient documentation

## 2017-06-07 DIAGNOSIS — D259 Leiomyoma of uterus, unspecified: Secondary | ICD-10-CM | POA: Diagnosis not present

## 2017-06-07 DIAGNOSIS — D250 Submucous leiomyoma of uterus: Secondary | ICD-10-CM

## 2017-06-07 DIAGNOSIS — N939 Abnormal uterine and vaginal bleeding, unspecified: Secondary | ICD-10-CM | POA: Diagnosis not present

## 2017-06-07 NOTE — Progress Notes (Signed)
[facility] Visit  06/07/2017            Patient name: Whitney Bush MRN [id number]  Date of birth: Jul 03, 1970  CC & HPI: Follow-up status post recent hospitalization for abnormal bleeding and anemia and transfusion 4 units packed cells see discharge summary  Whitney Bush is a 47 y.o. female presenting today for f/u visit for her AUB, that she has been controlling with Megace 40mg  3x a day since 02/12/17. She states she is still bleeding every day, however it is very light bleeding. The bleeding did completely stop for 2 days, but has started up again. She denies any shaking. She still gets irritated and cranky. No alleviating factors noted. Pt has not tried any other medications for relief.  I saw the patient while she was in the hospital, and she agreed to stop her alcohol consumption until we can address her need for surgery and recovery she was given Ativan and she states that she is doing well on the Ativan ROS:  ROS (+) light everyday spotting (-)shakes (+) irritable mood All systems are negative except as noted in the HPI and PMH.   Pertinent History Reviewed:   Reviewed: Significant for AUB Medical         Past Medical History:  Diagnosis Date  . Alcohol abuse   . 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 Anemia   . Blood transfusion without reported diagnosis   . Boil 06/04/2015  . Chronic kidney disease   . Depression   . GERD (gastroesophageal reflux disease)   . GI (gastrointestinal bleed) 07/28/2015  . Heart murmur   . Hyperlipidemia   . Hypertension   . Hypothyroidism   . Migraines   . OCD (obsessive compulsive disorder)   . Peripheral edema   . PTSD (post-traumatic stress disorder)   . Tubular adenoma                               Surgical Hx:    Past Surgical History:  Procedure Laterality Date  . ABCESS DRAINAGE     x5; neck, arm, chest, back  . BIOPSY  02/24/2015   Procedure: BIOPSY;  Surgeon: Danie Binder,  MD;  [facility];  Service: Endoscopy;;  gastric bx's  . BIOPSY  12/25/2016   Procedure: BIOPSY;  Surgeon: Daneil Dolin, MD;  [facility];  Service: Endoscopy;;  gastric  . CESAREAN SECTION    . COLONOSCOPY WITH PROPOFOL N/A 06/28/2015   Dr.Rourk- inadequate prep- One 6 mm polyp at the splenic flexure bx= tubular adenoma  . ESOPHAGEAL BANDING N/A 12/25/2016   Procedure: ESOPHAGEAL BANDING with Propofol;  Surgeon: Daneil Dolin, MD;  [facility];  Service: Endoscopy;  Laterality: N/A;  possible banding of varices  . ESOPHAGOGASTRODUODENOSCOPY (EGD) WITH PROPOFOL N/A 02/24/2015   SLF: Grade II esophageal varices Moderate portal hypertensive gastropathy Mild erosive gastritis anemia likely due to many factors: gastritis, gastropathy, coagulopathy, chronic disease  . ESOPHAGOGASTRODUODENOSCOPY (EGD) WITH PROPOFOL N/A 12/25/2016   Procedure: ESOPHAGOGASTRODUODENOSCOPY (EGD) WITH PROPOFOL;  Surgeon: Daneil Dolin, MD;  [facility];  Service: Endoscopy;  Laterality: N/A;  8:45am  . MALONEY DILATION N/A 12/25/2016   Procedure: Venia Minks DILATION;  Surgeon: Daneil Dolin, MD;  [facility];  Service: Endoscopy;  Laterality: N/A;  .  POLYPECTOMY  06/28/2015   Procedure: POLYPECTOMY;  Surgeon: Daneil Dolin, MD;  [facility];  Service: Endoscopy;;  at splenic flexure   Medications: Reviewed & Updated - see associated section                       Current Outpatient Medications:  .  acetaminophen (TYLENOL) 500 MG tablet, Take 1,000 mg by mouth every 6 (six) hours as needed for headache., Disp: , Rfl:  .  albuterol (PROVENTIL HFA;VENTOLIN HFA) 108 (90 Base) MCG/ACT inhaler, Inhale 1-2 puffs into the lungs every 6 (six) hours as needed for wheezing or shortness of breath., Disp: 1 Inhaler, Rfl: 1 .  B Complex-C (SUPER B COMPLEX PO), Take 1 tablet by mouth daily. , Disp: , Rfl:  .  Cholecalciferol (VITAMIN D PO), Take 1 tablet by mouth  once a week. , Disp: , Rfl:  .  Ferrous Sulfate (IRON) 325 (65 Fe) MG TABS, Take 1 tablet by mouth daily., Disp: , Rfl:  .  folic acid (FOLVITE) 1 MG tablet, Take 1 tablet (1 mg total) by mouth daily., Disp: 30 tablet, Rfl: 0 .  furosemide (LASIX) 40 MG tablet, Take 40 mg by mouth 2 (two) times daily. , Disp: , Rfl:  .  LORazepam (ATIVAN) 1 MG tablet, Take 1 tablet (1 mg total) by mouth every 6 (six) hours as needed for anxiety (anxiety, alcohol withdrawal)., Disp: 20 tablet, Rfl: 0 .  megestrol (MEGACE) 40 MG tablet, Take 2 tablets (80 mg total) by mouth 3 (three) times daily for 14 days., Disp: 84 tablet, Rfl: 0 .  Multiple Vitamin (MULTIVITAMIN WITH MINERALS) TABS tablet, Take 1 tablet by mouth daily., Disp: , Rfl:  .  propranolol (INDERAL) 10 MG tablet, Take 1 tablet (10 mg total) by mouth 3 (three) times daily. (Patient taking differently: Take 10 mg by mouth daily. ), Disp: 90 tablet, Rfl: 1 .  spironolactone (ALDACTONE) 50 MG tablet, Take 1 tablet (50 mg total) by mouth daily., Disp: 30 tablet, Rfl: 3 .  thiamine 100 MG tablet, Take 1 tablet (100 mg total) by mouth daily., Disp: 30 tablet, Rfl: 0 .  traZODone (DESYREL) 100 MG tablet, Take 1 tablet (100 mg total) by mouth at bedtime. (Patient taking differently: Take 150 mg by mouth at bedtime as needed. 150 mg at bedtime), Disp: 30 tablet, Rfl: 1 .  vitamin E 400 UNIT capsule, Take 400 Units by mouth daily., Disp: , Rfl:  .  lactulose (CONSTULOSE) 10 GM/15ML solution, Take 30 g by mouth 3 (three) times daily., Disp: , Rfl:    Social History: Reviewed -  reports that she quit smoking about 6 years ago. Her smoking use included cigarettes. She has a 5.00 pack-year smoking history. She has quit using smokeless tobacco. Her smokeless tobacco use included snuff.  Objective Findings:  Vitals: Blood pressure 116/70, pulse 76, height 5\' 3"  (1.6 m), weight 263 lb (119.3 kg).  PHYSICAL EXAMINATION General appearance - alert, well appearing, and in  no distress and oriented to person, place, and time Mental status - alert, oriented to person, place, and time, normal mood, behavior, speech, dress, motor activity, and thought processes  PELVIC Not indicated  Discussion: 1. Discussed with pt how her bleeding has progressed. Pt has noted large improvement, stating it I very light every day bleeding compared to the heavy bleeding she was experiencing.  At end of discussion, pt had opportunity to ask questions and has no further  questions at this time.   Specific discussion as noted above. Greater than 50% was spent in counseling and coordination of care with the patient.   Total time greater than: 15 minutes.    Assessment & Plan:   A:  1. Abnormal uterine bleeding associated with large fibroid uterus, stable on Megace 2. Status post transfusion 4 units packed cells 3. Status post alcohol use daily, now past the risk of acute withdrawal 4.   P:  1.  Check hemoglobin levels 11.6 today 2. Schedule surgery in May, with pre-op visit for early May currently scheduled may 13-15 but may move up to a first or second or 3 May    By signing my name below, I, Izna Ahmed, attest that this documentation has been prepared under the direction and in the presence of Jonnie Kind, MD. Electronically Signed: Jabier Gauss, Medical Scribe. 06/07/17. 4:20 PM.  I personally performed the services described in this documentation, which was SCRIBED in my presence. The recorded information has been reviewed and considered accurate. It has been edited as necessary during review. Jonnie Kind, MD

## 2017-06-08 NOTE — Progress Notes (Signed)
Whitney Bush, did the order for the Hgb/Hct get put in??? Needs this week.

## 2017-06-12 ENCOUNTER — Other Ambulatory Visit: Payer: Self-pay

## 2017-06-12 DIAGNOSIS — D649 Anemia, unspecified: Secondary | ICD-10-CM

## 2017-06-12 LAB — HEMOGLOBIN AND HEMATOCRIT, BLOOD
HEMATOCRIT: 33.8 % — AB (ref 35.0–45.0)
Hemoglobin: 11 g/dL — ABNORMAL LOW (ref 11.7–15.5)

## 2017-06-13 NOTE — Progress Notes (Signed)
Please let her know her H/H are much better. hgb now 11.  Proceed with surgery with Dr. Glo Herring as planned. They should be rechecking H/H around time of her surgery.   We can check once again in 2 months.

## 2017-06-14 ENCOUNTER — Other Ambulatory Visit: Payer: Self-pay

## 2017-06-14 DIAGNOSIS — D649 Anemia, unspecified: Secondary | ICD-10-CM

## 2017-07-09 ENCOUNTER — Encounter (INDEPENDENT_AMBULATORY_CARE_PROVIDER_SITE_OTHER): Payer: Self-pay

## 2017-07-09 ENCOUNTER — Ambulatory Visit (INDEPENDENT_AMBULATORY_CARE_PROVIDER_SITE_OTHER): Payer: Medicaid Other | Admitting: Obstetrics and Gynecology

## 2017-07-09 VITALS — BP 134/70 | Ht 63.0 in | Wt 265.0 lb

## 2017-07-09 DIAGNOSIS — D252 Subserosal leiomyoma of uterus: Secondary | ICD-10-CM | POA: Diagnosis not present

## 2017-07-09 DIAGNOSIS — F1020 Alcohol dependence, uncomplicated: Secondary | ICD-10-CM

## 2017-07-09 DIAGNOSIS — K7031 Alcoholic cirrhosis of liver with ascites: Secondary | ICD-10-CM

## 2017-07-09 DIAGNOSIS — D250 Submucous leiomyoma of uterus: Secondary | ICD-10-CM | POA: Diagnosis not present

## 2017-07-09 NOTE — Progress Notes (Addendum)
Preoperative History and Physical  Whitney Bush is a 47 y.o. G2P1011 here for surgical management of AUB associated with large fibroid uterus.  She has resumed bleeding which she described as light.  I reviewed the chart in further detail after she left and reveal that she had been admitted and transfused 4 units packed cells after continuing her alcoholism and having her hemoglobin drop to 6.4   before receiving 4 units   Her recent relapse in alcohol "due to somebody dying and "everybody was just their drinking" remain significant preoperative concerns. Pt is still taking Megace. Hemoglobin on 06/12/17 was 11.  Proposed surgery: Supracervical hysterectomy bilateral salpingectomy, and panniculectomy (to allow a Pelosi incision)  Past Medical History:  Diagnosis Date  . Alcohol abuse   . 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 Anemia   . Blood transfusion without reported diagnosis   . Boil 06/04/2015  . Chronic kidney disease   . Depression   . GERD (gastroesophageal reflux disease)   . GI (gastrointestinal bleed) 07/28/2015  . Heart murmur   . Hyperlipidemia   . Hypertension   . Hypothyroidism   . Migraines   . OCD (obsessive compulsive disorder)   . Peripheral edema   . PTSD (post-traumatic stress disorder)   . Tubular adenoma    Past Surgical History:  Procedure Laterality Date  . ABCESS DRAINAGE     x5; neck, arm, chest, back  . BIOPSY  02/24/2015   Procedure: BIOPSY;  Surgeon: Danie Binder, MD;  [facility];  Service: Endoscopy;;  gastric bx's  . BIOPSY  12/25/2016   Procedure: BIOPSY;  Surgeon: Daneil Dolin, MD;  [facility];  Service: Endoscopy;;  gastric  . CESAREAN SECTION    . COLONOSCOPY WITH PROPOFOL N/A 06/28/2015   Dr.Rourk- inadequate prep- One 6 mm polyp at the splenic flexure bx= tubular adenoma  . ESOPHAGEAL BANDING N/A 12/25/2016   Procedure: ESOPHAGEAL BANDING with Propofol;  Surgeon: Daneil Dolin, MD;  [facility];  Service: Endoscopy;  Laterality: N/A;  possible banding of varices  . ESOPHAGOGASTRODUODENOSCOPY (EGD) WITH PROPOFOL N/A 02/24/2015   SLF: Grade II esophageal varices Moderate portal hypertensive gastropathy Mild erosive gastritis anemia likely due to many factors: gastritis, gastropathy, coagulopathy, chronic disease  . ESOPHAGOGASTRODUODENOSCOPY (EGD) WITH PROPOFOL N/A 12/25/2016   Procedure: ESOPHAGOGASTRODUODENOSCOPY (EGD) WITH PROPOFOL;  Surgeon: Daneil Dolin, MD;  [facility];  Service: Endoscopy;  Laterality: N/A;  8:45am  . MALONEY DILATION N/A 12/25/2016   Procedure: Venia Minks DILATION;  Surgeon: Daneil Dolin, MD;  [facility];  Service: Endoscopy;  Laterality: N/A;  . POLYPECTOMY  06/28/2015   Procedure: POLYPECTOMY;  Surgeon: Daneil Dolin, MD;  [facility];  Service: Endoscopy;;  at splenic flexure   OB History  Gravida Para Term Preterm AB Living  2 1 1   1 1   SAB TAB Ectopic Multiple Live Births          1    # Outcome Date GA Lbr Len/2nd Weight Sex Delivery Anes PTL Lv  2 AB           1 Term     F CS-LTranv   LIV  Patient denies any other pertinent gynecologic issues.   Current Outpatient Medications on File Prior to Visit  Medication Sig Dispense Refill  . acetaminophen (TYLENOL) 500 MG tablet Take 1,000  mg by mouth every 6 (six) hours as needed for headache.    . albuterol (PROVENTIL HFA;VENTOLIN HFA) 108 (90 Base) MCG/ACT inhaler Inhale 1-2 puffs into the lungs every 6 (six) hours as needed for wheezing or shortness of breath. 1 Inhaler 1  . B Complex-C (SUPER B COMPLEX PO) Take 1 tablet by mouth daily.     . Cholecalciferol (VITAMIN D PO) Take 1 tablet by mouth once a week.     . Ferrous Sulfate (IRON) 325 (65 Fe) MG TABS Take 1 tablet by mouth daily.    . furosemide (LASIX) 40 MG tablet Take 40 mg by mouth 2 (two) times daily.     Marland Kitchen lactulose (CONSTULOSE) 10 GM/15ML solution Take 30 g by mouth 3  (three) times daily.    Marland Kitchen LORazepam (ATIVAN) 1 MG tablet Take 1 tablet (1 mg total) by mouth every 6 (six) hours as needed for anxiety (anxiety, alcohol withdrawal). 20 tablet 0  . Multiple Vitamin (MULTIVITAMIN WITH MINERALS) TABS tablet Take 1 tablet by mouth daily.    . propranolol (INDERAL) 10 MG tablet Take 1 tablet (10 mg total) by mouth 3 (three) times daily. (Patient taking differently: Take 10 mg by mouth daily. ) 90 tablet 1  . spironolactone (ALDACTONE) 50 MG tablet Take 1 tablet (50 mg total) by mouth daily. 30 tablet 3  . traZODone (DESYREL) 100 MG tablet Take 1 tablet (100 mg total) by mouth at bedtime. (Patient taking differently: Take 150 mg by mouth at bedtime as needed. 150 mg at bedtime) 30 tablet 1  . vitamin E 400 UNIT capsule Take 400 Units by mouth daily.     No current facility-administered medications on file prior to visit.    No Known Allergies  Social History:   reports that she quit smoking about 6 years ago. Her smoking use included cigarettes. She has a 5.00 pack-year smoking history. She has quit using smokeless tobacco. Her smokeless tobacco use included snuff. She reports that she drinks alcohol. She reports that she does not use drugs.  Family History  Problem Relation Age of Onset  . Diabetes Father   . Hyperlipidemia Father   . Alcohol abuse Father   . Cancer Other   . Cervical cancer Maternal Grandmother   . Lung cancer Maternal Grandfather   . Alcohol abuse Other        multiple family members  . Diabetes Sister   . Hypertension Brother   . Colon cancer Neg Hx   . Liver disease Neg Hx     Review of Systems: Noncontributory  PHYSICAL EXAM: There were no vitals taken for this visit. General appearance - alert, well appearing, and in no distress Chest - clear to auscultation, no wheezes, rales or rhonchi, symmetric air entry Heart - normal rate and regular rhythm Abdomen - soft, nontender, nondistended, no masses or organomegaly Pelvic exam:   VULVA: normal appearing vulva with no masses, tenderness or lesions,  VAGINA: normal appearing vagina with normal color and discharge, no lesions, good length CERVIX: normal appearing small cervix without discharge or lesions, moderate bleeding UTERUS: uterus is enlarged, 16-19 week size ADNEXA: normal adnexa in size, nontender and no masses. Extremities - peripheral pulses normal, no pedal edema, no clubbing or cyanosis  Labs: No results found for this or any previous visit (from the past 336 hour(s)).  Imaging Studies: No results found.  Assessment: Patient Active Problem List   Diagnosis Date Noted  . Anemia due to chronic blood loss  06/07/2017  . Symptomatic anemia 06/03/2017  . Abnormal uterine bleeding (AUB) 02/12/2017  . Acute blood loss anemia 11/27/2016  . Thickened endometrium 11/23/2016  . Uterine leiomyoma 11/23/2016  . Menorrhagia with regular cycle 11/23/2016  . Alcoholic cirrhosis of liver ([place]) 07/07/2016  . Esophageal dysphagia 07/07/2016  . Esophageal varices without bleeding ([place]) 07/07/2016  . IDA (iron deficiency anemia) 06/09/2016  . PTSD (post-traumatic stress disorder) 04/20/2016  . OCD (obsessive compulsive disorder) 04/20/2016  . Severe recurrent major depression without psychotic features ([place]) 04/19/2016  . GERD (gastroesophageal reflux disease) 03/09/2016  . Depression 02/07/2016  . Bilateral edema of lower extremity   . History of colonic polyps   . Alcoholic cirrhosis of liver with ascites ([place])   . Anasarca 02/16/2015  . Alcoholic hepatitis with ascites   . Ascites   . Bleeding gastrointestinal   . AKI (acute kidney injury) ([place]) 12/10/2014  . Volume overload 12/10/2014  . Alcoholic cirrhosis ([place])   . Elevated bilirubin   . Nausea with vomiting   . Diarrhea   . Absolute anemia 12/02/2014  . Jaundice 12/02/2014  . Essential hypertension 12/02/2014  . Alcohol use disorder, moderate, dependence ([place]) 12/02/2014  . Abdominal pain  12/02/2014    Plan: 1. Patient will undergo surgical management with abdominal supracervical hysterectomy, bilateral salpingectomy, to schedule in May, if patient's hemoglobin is acceptable and her liver enzymes are back to normal 2. Order CBC, cmet today , also PT/INR today   By signing my name below, I, Izna Ahmed, attest that this documentation has been prepared under the direction and in the presence of Jonnie Kind, MD. Electronically Signed: Jabier Gauss, Medical Scribe. 07/09/17. 3:26 PM.  I personally performed the services described in this documentation, which was SCRIBED in my presence. The recorded information has been reviewed and considered accurate. It has been edited as necessary during review. Jonnie Kind, MD

## 2017-07-11 ENCOUNTER — Telehealth: Payer: Self-pay | Admitting: Obstetrics and Gynecology

## 2017-07-11 DIAGNOSIS — F1021 Alcohol dependence, in remission: Secondary | ICD-10-CM

## 2017-07-11 NOTE — Telephone Encounter (Signed)
Patient left the office the other day without getting her CBC and c-Met labs to be drawn.  Patient is informed she needs to come by and pick up the lab slips and get those tests done.  Depending on the results we may need to postpone the surgery and give her Depo-Lupron to suppress the uterine fibroids

## 2017-07-13 ENCOUNTER — Other Ambulatory Visit: Payer: Self-pay | Admitting: Obstetrics and Gynecology

## 2017-07-13 DIAGNOSIS — D508 Other iron deficiency anemias: Secondary | ICD-10-CM

## 2017-07-13 DIAGNOSIS — D500 Iron deficiency anemia secondary to blood loss (chronic): Secondary | ICD-10-CM

## 2017-07-13 DIAGNOSIS — N920 Excessive and frequent menstruation with regular cycle: Secondary | ICD-10-CM

## 2017-07-14 LAB — CBC
HEMOGLOBIN: 12.2 g/dL (ref 11.1–15.9)
Hematocrit: 35.7 % (ref 34.0–46.6)
MCH: 29.1 pg (ref 26.6–33.0)
MCHC: 34.2 g/dL (ref 31.5–35.7)
MCV: 85 fL (ref 79–97)
Platelets: 116 10*3/uL — ABNORMAL LOW (ref 150–379)
RBC: 4.19 x10E6/uL (ref 3.77–5.28)
RDW: 21.2 % — ABNORMAL HIGH (ref 12.3–15.4)
WBC: 6.6 10*3/uL (ref 3.4–10.8)

## 2017-07-14 LAB — COMPREHENSIVE METABOLIC PANEL
ALK PHOS: 89 IU/L (ref 39–117)
ALT: 42 IU/L — ABNORMAL HIGH (ref 0–32)
AST: 47 IU/L — AB (ref 0–40)
Albumin/Globulin Ratio: 0.9 — ABNORMAL LOW (ref 1.2–2.2)
Albumin: 3.9 g/dL (ref 3.5–5.5)
BUN/Creatinine Ratio: 8 — ABNORMAL LOW (ref 9–23)
BUN: 8 mg/dL (ref 6–24)
Bilirubin Total: 1.4 mg/dL — ABNORMAL HIGH (ref 0.0–1.2)
CO2: 24 mmol/L (ref 20–29)
CREATININE: 0.98 mg/dL (ref 0.57–1.00)
Calcium: 10.1 mg/dL (ref 8.7–10.2)
Chloride: 97 mmol/L (ref 96–106)
GFR calc Af Amer: 81 mL/min/{1.73_m2} (ref >59)
GFR, EST NON AFRICAN AMERICAN: 70 mL/min/{1.73_m2} (ref >59)
Globulin, Total: 4.3 g/dL (ref 1.5–4.5)
Glucose: 102 mg/dL — ABNORMAL HIGH (ref 65–99)
Potassium: 3.6 mmol/L (ref 3.5–5.2)
Sodium: 138 mmol/L (ref 134–144)
Total Protein: 8.2 g/dL (ref 6.0–8.5)

## 2017-07-17 ENCOUNTER — Other Ambulatory Visit: Payer: Self-pay | Admitting: Obstetrics and Gynecology

## 2017-07-17 DIAGNOSIS — Z01818 Encounter for other preprocedural examination: Secondary | ICD-10-CM

## 2017-07-18 ENCOUNTER — Encounter (HOSPITAL_COMMUNITY): Payer: Self-pay

## 2017-07-18 ENCOUNTER — Other Ambulatory Visit: Payer: Self-pay

## 2017-07-18 ENCOUNTER — Encounter (HOSPITAL_COMMUNITY)
Admission: RE | Admit: 2017-07-18 | Discharge: 2017-07-18 | Disposition: A | Discharge status: Home / Self Care | Payer: Medicaid Other | Source: Ambulatory Visit | Attending: Obstetrics and Gynecology | Admitting: Obstetrics and Gynecology

## 2017-07-18 DIAGNOSIS — K219 Gastro-esophageal reflux disease without esophagitis: Secondary | ICD-10-CM | POA: Diagnosis not present

## 2017-07-18 DIAGNOSIS — I129 Hypertensive chronic kidney disease with stage 1 through stage 4 chronic kidney disease, or unspecified chronic kidney disease: Secondary | ICD-10-CM | POA: Insufficient documentation

## 2017-07-18 DIAGNOSIS — E785 Hyperlipidemia, unspecified: Secondary | ICD-10-CM | POA: Insufficient documentation

## 2017-07-18 DIAGNOSIS — Z01812 Encounter for preprocedural laboratory examination: Secondary | ICD-10-CM | POA: Insufficient documentation

## 2017-07-18 DIAGNOSIS — F329 Major depressive disorder, single episode, unspecified: Secondary | ICD-10-CM | POA: Insufficient documentation

## 2017-07-18 DIAGNOSIS — D649 Anemia, unspecified: Secondary | ICD-10-CM | POA: Diagnosis not present

## 2017-07-18 DIAGNOSIS — K7030 Alcoholic cirrhosis of liver without ascites: Secondary | ICD-10-CM | POA: Insufficient documentation

## 2017-07-18 DIAGNOSIS — N189 Chronic kidney disease, unspecified: Secondary | ICD-10-CM | POA: Insufficient documentation

## 2017-07-18 DIAGNOSIS — R600 Localized edema: Secondary | ICD-10-CM | POA: Diagnosis not present

## 2017-07-18 DIAGNOSIS — Z01818 Encounter for other preprocedural examination: Secondary | ICD-10-CM

## 2017-07-18 LAB — CBC WITH DIFFERENTIAL/PLATELET
Basophils Absolute: 0 10*3/uL (ref 0.0–0.1)
Basophils Relative: 0 %
EOS PCT: 1 %
Eosinophils Absolute: 0 10*3/uL (ref 0.0–0.7)
HCT: 37.7 % (ref 36.0–46.0)
Hemoglobin: 12 g/dL (ref 12.0–15.0)
LYMPHS PCT: 16 %
Lymphs Abs: 1 10*3/uL (ref 0.7–4.0)
MCH: 27.9 pg (ref 26.0–34.0)
MCHC: 31.8 g/dL (ref 30.0–36.0)
MCV: 87.7 fL (ref 78.0–100.0)
MONO ABS: 0.6 10*3/uL (ref 0.1–1.0)
MONOS PCT: 9 %
Neutro Abs: 4.7 10*3/uL (ref 1.7–7.7)
Neutrophils Relative %: 74 %
PLATELETS: 96 10*3/uL — AB (ref 150–400)
RBC: 4.3 MIL/uL (ref 3.87–5.11)
RDW: 19.8 % — ABNORMAL HIGH (ref 11.5–15.5)
WBC: 6.3 10*3/uL (ref 4.0–10.5)

## 2017-07-18 LAB — COMPREHENSIVE METABOLIC PANEL
ALK PHOS: 83 U/L (ref 38–126)
ALT: 45 U/L (ref 14–54)
AST: 55 U/L — AB (ref 15–41)
Albumin: 3.7 g/dL (ref 3.5–5.0)
Anion gap: 12 (ref 5–15)
BILIRUBIN TOTAL: 1.6 mg/dL — AB (ref 0.3–1.2)
BUN: 6 mg/dL (ref 6–20)
CALCIUM: 9.1 mg/dL (ref 8.9–10.3)
CO2: 23 mmol/L (ref 22–32)
Chloride: 101 mmol/L (ref 101–111)
Creatinine, Ser: 0.76 mg/dL (ref 0.44–1.00)
GFR calc non Af Amer: >60 mL/min (ref >60)
Glucose, Bld: 111 mg/dL — ABNORMAL HIGH (ref 65–99)
Potassium: 3.6 mmol/L (ref 3.5–5.1)
Sodium: 136 mmol/L (ref 135–145)
TOTAL PROTEIN: 8.3 g/dL — AB (ref 6.5–8.1)

## 2017-07-18 LAB — APTT: aPTT: 34 seconds (ref 24–36)

## 2017-07-18 LAB — PROTIME-INR
INR: 1.13
PROTHROMBIN TIME: 14.4 s (ref 11.4–15.2)

## 2017-07-18 LAB — HCG, SERUM, QUALITATIVE: PREG SERUM: NEGATIVE

## 2017-07-18 NOTE — Patient Instructions (Addendum)
Whitney Bush  07/18/2017     @PREFPERIOPPHARMACY @   [facility] on 07/21/2017 at 10:00 for Type and Screen   Your procedure is scheduled on 07/24/2017.  Report to [facility] at 6:15 A.M.  Call this number if you have problems the morning of surgery:  [phone]   Remember:  Nothing to eat or drink after midnight.  Full liquids only after 12 noon day prior 5/27.              Take 1 bottle of magnesium citrate at 12 Noon the day before surgery.  Drink plenty of liquids after taking mag citrate in order for laxative to work.             Instruct patient to only have full liquids after taking mag citrate.   Take these medicines the morning of surgery with A SIP OF WATER:NONE   Do not wear jewelry, make-up or nail polish.  Do not wear lotions, powders, or perfumes, or deodorant.  Do not shave 48 hours prior to surgery.  Men may shave face and neck.  Do not bring valuables to the hospital.  [facility] is not responsible for any belongings or valuables.  Contacts, dentures or bridgework may not be worn into surgery.  Leave your suitcase in the car.  After surgery it may be brought to your room.  For patients admitted to the hospital, discharge time will be determined by your treatment team.  Patients discharged the day of surgery will not be allowed to drive home.    Please read over the following fact sheets that you were given. Surgical Site Infection Prevention and Anesthesia Post-op Instructions     PATIENT INSTRUCTIONS POST-ANESTHESIA  IMMEDIATELY FOLLOWING SURGERY:  Do not drive or operate machinery for the first twenty four hours after surgery.  Do not make any important decisions for twenty four hours after surgery or while taking narcotic pain medications or sedatives.  If you develop intractable nausea and vomiting or a severe headache please notify your doctor immediately.  FOLLOW-UP:  Please make an appointment with your surgeon as instructed. You do not  need to follow up with anesthesia unless specifically instructed to do so.  WOUND CARE INSTRUCTIONS (if applicable):  Keep a dry clean dressing on the anesthesia/puncture wound site if there is drainage.  Once the wound has quit draining you may leave it open to air.  Generally you should leave the bandage intact for twenty four hours unless there is drainage.  If the epidural site drains for more than 36-48 hours please call the anesthesia department.  QUESTIONS?:  Please feel free to call your physician or the hospital operator if you have any questions, and they will be happy to assist you.      Salpingectomy Salpingectomy, also called tubectomy, is the surgical removal of one of the fallopian tubes. The fallopian tubes are where eggs travel from the ovaries to the uterus. Removing one fallopian tube does not prevent you from becoming pregnant. It also does not cause problems with your menstrual periods. You may need a salpingectomy if you:  Have a fertilized egg that attaches to the fallopian tube (ectopic pregnancy), especially one that causes the tube to burst or tear (rupture).  Have an infected fallopian tube.  Have cancer of the fallopian tube or nearby organs.  Have had an ovary removed due to a cyst or tumor.  Have had your uterus removed.  There are three different methods  that can be used for a salpingectomy:  Open. This method involves making one large incision in your abdomen.  Laparoscopic. This method involves using a thin, lighted tube with a tiny camera on the end (laparoscope) to help perform the procedure. The laparoscope will allow your surgeon to make several small incisions in the abdomen instead of a large incision.  Robot-assisted: This method involves using a computer to control surgical instruments that are attached to robotic arms.  Tell a health care provider about:  Any allergies you have.  All medicines you are taking, including vitamins, herbs, eye  drops, creams, and over-the-counter medicines.  Any problems you or family members have had with anesthetic medicines.  Any blood disorders you have.  Any surgeries you have had.  Any medical conditions you have.  Whether you are pregnant or may be pregnant. What are the risks? Generally, this is a safe procedure. However, problems may occur, including:  Infection.  Bleeding.  Allergic reactions to medicines.  Damage to other structures or organs.  Blood clots in the legs or lungs.  What happens before the procedure? Staying hydrated Follow instructions from your health care provider about hydration, which may include:  Up to 2 hours before the procedure - you may continue to drink clear liquids, such as water, clear fruit juice, black coffee, and plain tea.  Eating and drinking restrictions Follow instructions from your health care provider about eating and drinking, which may include:  8 hours before the procedure - stop eating heavy meals or foods such as meat, fried foods, or fatty foods.  6 hours before the procedure - stop eating light meals or foods, such as toast or cereal.  6 hours before the procedure - stop drinking milk or drinks that contain milk.  2 hours before the procedure - stop drinking clear liquids.  Medicines  Ask your health care provider about: ? Changing or stopping your regular medicines. This is especially important if you are taking diabetes medicines or blood thinners. ? Taking medicines such as aspirin and ibuprofen. These medicines can thin your blood. Do not take these medicines before your procedure if your health care provider instructs you not to.  You may be given antibiotic medicine to help prevent infection. General instructions  Do not smoke for at least 2 weeks before your procedure. If you need help quitting, ask your health care provider.  You may have an exam or tests, such as an electrocardiogram (ECG).  You may have a  blood or urine sample taken.  Ask your health care provider: ? Whether you should stop removing hair from your surgical area. ? How your surgical site will be marked or identified.  You may be asked to shower with a germ-killing soap.  Plan to have someone take you home from the hospital or clinic.  If you will be going home right after the procedure, plan to have someone with you for 24 hours. What happens during the procedure?  To reduce your risk of infection: ? Your health care team will wash or sanitize their hands. ? Hair may be removed from the surgical area. ? Your skin will be washed with soap.  An IV tube will be inserted into one of your veins.  You will be given a medicine to make you fall asleep (general anesthetic). You may also be given a medicine to help you relax (sedative).  A thin tube (catheter) may be inserted through your urethra and into your bladder to  drain urine during your procedure.  Depending on the type of procedure you are having, one incision or several small incisions will be made in your abdomen.  Your fallopian tube will be cut and removed from where it attaches to your uterus.  Your blood vessels will be clamped and tied to prevent excess bleeding.  The incision(s) in your abdomen will be closed with stitches (sutures), staples, or skin glue.  A bandage (dressing) may be placed over your incision(s). The procedure may vary among health care providers and hospitals. What happens after the procedure?  Your blood pressure, heart rate, breathing rate, and blood oxygen level will be monitored until the medicines you were given have worn off.  You may continue to receive fluids and medicines through an IV tube.  You may continue to have a catheter draining your urine.  You may have to wear compression stockings. These stockings help to prevent blood clots and reduce swelling in your legs.  You will be given pain medicine as needed.  Do not  drive for 24 hours if you received a sedative. Summary  Salpingectomy is a surgical procedure to remove one of the fallopian tubes.  The procedure may be done with an open incision, with a laparoscope, or with computer-controlled instruments.  Depending on the type of procedure you are having, one incision or several small incisions will be made in your abdomen.  Your blood pressure, heart rate, breathing rate, and blood oxygen level will be monitored until the medicines you were given have worn off.  Plan to have someone take you home from the hospital or clinic. This information is not intended to replace advice given to you by your health care provider. Make sure you discuss any questions you have with your health care provider. Document Released: 07/02/2008 Document Revised: 10/01/2015 Document Reviewed: 08/07/2012 Elsevier Interactive Patient Education  2018 [place] Hysterectomy A supracervical hysterectomy is surgery to remove the top part of the uterus, but not the cervix. You will no longer have menstrual periods or be able to get pregnant after this surgery. The fallopian tubes and ovaries may also be removed (bilateral salpingo-oophorectomy) during this surgery. This surgery is usually performed using a minimally invasive technique called laparoscopy. This technique allows the surgery to be done through small incisions. The minimally invasive technique provides benefits such as less pain, less risk of infection, and shorter recovery time. Tell a health care provider about:  Any allergies you have.  All medicines you are taking, including vitamins, herbs, eye drops, creams, and over-the-counter medicines.  Any problems you or family members have had with anesthetic medicines.  Any blood disorders you have.  Any surgeries you have had.  Any medical conditions you have. What are the risks? Generally, this is a safe procedure. However, as with any procedure,  complications can occur. Possible complications include:  Bleeding.  Blood clots in the legs or lung.  Infection.  Injury to surrounding organs.  Problems related to anesthesia.  Conversion to an open abdominal surgery.  Additional surgery later to remove the cervix if you have problems with the cervix.  What happens before the procedure?  Ask your health care provider about changing or stopping your regular medicines.  Do not take aspirin or blood thinners (anticoagulants) for 1 week before the surgery, or as directed by your health care provider.  Do not eat or drink anything for 8 hours before the surgery, or as directed by your health care provider.  Quit smoking if you smoke.  Arrange for a ride home after surgery and for someone to help you at home during recovery. What happens during the procedure?  You will be given an antibiotic medicine.  An IV tube will be placed in one of your veins. You will be given medicine to make you sleep (general anesthetic).  A gas (carbon dioxide) will be used to inflate your abdomen. This will allow your surgeon to look inside your abdomen, perform your surgery, and treat any other problems found if necessary.  Three or four small incisions will be made in your abdomen. One of these incisions will be made in the area of your belly button (navel). A thin, flexible tube with a tiny camera and light on the end of it (laparoscope) will be inserted into the incision. The camera on the laparoscope sends a picture to a TV screen in the operating room. This gives your surgeon a good view inside the abdomen.  Other surgical instruments will be inserted through the other incisions.  The uterus will be cut into small pieces and removed through the small incisions.  Your incisions will be closed. What happens after the procedure?  You will be taken to a recovery area where your progress will be monitored until you are awake, stable, and taking  fluids well. If there are no other problems, you will then be moved to a regular hospital room, or you will be allowed to go home.  You will likely have minimal discomfort after the surgery because the incisions are so small with the laparoscopic technique.  You will be given pain medicine while you are in the hospital and for when you go home.  If a bilateral salpingo-oophorectomy was performed before menopause, you will go through a sudden (abrupt) menopause. This can be helped with hormone medicines. This information is not intended to replace advice given to you by your health care provider. Make sure you discuss any questions you have with your health care provider. Document Released: 08/02/2007 Document Revised: 07/22/2015 Document Reviewed: 08/16/2012 Elsevier Interactive Patient Education  [employer].

## 2017-07-18 NOTE — Pre-Procedure Instructions (Signed)
Patient had blood transfusion on 06/05/2017, therefore cannot draw type and screen today.  Department is closed on 5/27 and surgery is for 5/28.  Per Sherri in the lab, patient may come to the lab at 10:00 on Saturday 5/25 to have type and screen drawn.  Advised patient to make sure that lab staff place her blood band on and not to take off prior to surgery.  Verbalized understanding.  Signed patient transfusion history form taken to [facility] in lab.

## 2017-07-19 ENCOUNTER — Encounter: Payer: Self-pay | Admitting: *Deleted

## 2017-07-19 ENCOUNTER — Telehealth: Payer: Self-pay | Admitting: *Deleted

## 2017-07-19 LAB — RPR: RPR Ser Ql: NONREACTIVE

## 2017-07-20 ENCOUNTER — Encounter (HOSPITAL_COMMUNITY): Payer: Self-pay | Admitting: Anesthesiology

## 2017-07-21 DIAGNOSIS — Z01812 Encounter for preprocedural laboratory examination: Secondary | ICD-10-CM | POA: Diagnosis not present

## 2017-07-22 LAB — HIV ANTIBODY (ROUTINE TESTING W REFLEX): HIV Screen 4th Generation wRfx: NONREACTIVE

## 2017-07-24 ENCOUNTER — Ambulatory Visit (INDEPENDENT_AMBULATORY_CARE_PROVIDER_SITE_OTHER): Payer: Medicaid Other | Admitting: Obstetrics and Gynecology

## 2017-07-24 ENCOUNTER — Other Ambulatory Visit (HOSPITAL_COMMUNITY)
Admission: RE | Admit: 2017-07-24 | Discharge: 2017-07-24 | Disposition: A | Discharge status: Home / Self Care | Payer: Medicaid Other | Source: Ambulatory Visit | Attending: Obstetrics and Gynecology | Admitting: Obstetrics and Gynecology

## 2017-07-24 ENCOUNTER — Other Ambulatory Visit: Payer: Self-pay

## 2017-07-24 ENCOUNTER — Encounter (HOSPITAL_COMMUNITY)
Admission: RE | Disposition: A | Discharge status: Home / Self Care | Payer: Self-pay | Source: Ambulatory Visit | Attending: Obstetrics and Gynecology

## 2017-07-24 ENCOUNTER — Encounter: Payer: Self-pay | Admitting: Obstetrics and Gynecology

## 2017-07-24 ENCOUNTER — Ambulatory Visit (HOSPITAL_COMMUNITY)
Admission: RE | Admit: 2017-07-24 | Discharge: 2017-07-30 | Disposition: A | Discharge status: Home / Self Care | Payer: Medicaid Other | Source: Ambulatory Visit | Attending: Obstetrics and Gynecology | Admitting: Obstetrics and Gynecology

## 2017-07-24 VITALS — BP 134/84 | HR 104 | Ht 63.0 in | Wt 262.0 lb

## 2017-07-24 DIAGNOSIS — L03116 Cellulitis of left lower limb: Secondary | ICD-10-CM | POA: Diagnosis not present

## 2017-07-24 DIAGNOSIS — Z538 Procedure and treatment not carried out for other reasons: Secondary | ICD-10-CM | POA: Diagnosis present

## 2017-07-24 DIAGNOSIS — Z124 Encounter for screening for malignant neoplasm of cervix: Secondary | ICD-10-CM | POA: Insufficient documentation

## 2017-07-24 SURGERY — HYSTERECTOMY, SUPRACERVICAL, ABDOMINAL
Anesthesia: General

## 2017-07-24 MED ORDER — DOXYCYCLINE HYCLATE 100 MG PO CAPS: 100.0000 mg | ORAL_CAPSULE | Freq: Two times a day (BID) | ORAL | 1 refills | Status: DC

## 2017-07-24 NOTE — Progress Notes (Signed)
[facility] Visit  @DATE @            Patient name: Whitney Bush MRN [id number]  Date of birth: 12/13/1970  CC & HPI: pap smear, and treat left thigh chronic infection.  Keshauna D Shimada is a 47 y.o. female presenting today for obtaining a pap smear. She was scheduled for Abdominal supracervical hysterectomy, but the Pt's report from [place] that allegedly included a Pap smear has been reviewed and did not include a pap. Surgery is delayed til pap can be resulted.   ROS:  ROS Pt has had chronic soreness of the left inner thigh, a chronic sore that is currently not draining, but has required I&D in the past when worse than at present. No fever.  Pertinent History Reviewed:   Reviewed: Significant for labs stable,  Currently lite menses. Medical         Past Medical History:  Diagnosis Date  . Alcohol abuse   . 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 Anemia   . Blood transfusion without reported diagnosis   . Boil 06/04/2015  . Chronic kidney disease   . Depression   . GERD (gastroesophageal reflux disease)   . GI (gastrointestinal bleed) 07/28/2015  . Heart murmur   . Hyperlipidemia   . Hypertension   . Hypothyroidism   . Migraines   . OCD (obsessive compulsive disorder)   . Peripheral edema   . PTSD (post-traumatic stress disorder)   . Tubular adenoma                               Surgical Hx:    Past Surgical History:  Procedure Laterality Date  . ABCESS DRAINAGE     x5; neck, arm, chest, back  . BIOPSY  02/24/2015   Procedure: BIOPSY;  Surgeon: Danie Binder, MD;  Location: [facility];  Service: Endoscopy;;  gastric bx's  . BIOPSY  12/25/2016   Procedure: BIOPSY;  Surgeon: Daneil Dolin, MD;  Location: [facility];  Service: Endoscopy;;  gastric  . CESAREAN SECTION    . COLONOSCOPY WITH PROPOFOL N/A 06/28/2015   Dr.Rourk- inadequate prep- One 6 mm polyp at the splenic flexure bx= tubular adenoma  .  ESOPHAGEAL BANDING N/A 12/25/2016   Procedure: ESOPHAGEAL BANDING with Propofol;  Surgeon: Daneil Dolin, MD;  Location: [facility];  Service: Endoscopy;  Laterality: N/A;  possible banding of varices  . ESOPHAGOGASTRODUODENOSCOPY (EGD) WITH PROPOFOL N/A 02/24/2015   SLF: Grade II esophageal varices Moderate portal hypertensive gastropathy Mild erosive gastritis anemia likely due to many factors: gastritis, gastropathy, coagulopathy, chronic disease  . ESOPHAGOGASTRODUODENOSCOPY (EGD) WITH PROPOFOL N/A 12/25/2016   Procedure: ESOPHAGOGASTRODUODENOSCOPY (EGD) WITH PROPOFOL;  Surgeon: Daneil Dolin, MD;  Location: [facility];  Service: Endoscopy;  Laterality: N/A;  8:45am  . MALONEY DILATION N/A 12/25/2016   Procedure: Venia Minks DILATION;  Surgeon: Daneil Dolin, MD;  Location: [facility];  Service: Endoscopy;  Laterality: N/A;  . POLYPECTOMY  06/28/2015   Procedure: POLYPECTOMY;  Surgeon: Daneil Dolin, MD;  Location: [facility];  Service: Endoscopy;;  at splenic flexure   Medications: Reviewed & Updated - see associated section                      No current outpatient medications on file.  Social History: Reviewed -  reports that she quit smoking about 6 years ago. Her smoking use included cigarettes. She has a 5.00 pack-year smoking history. She has quit using smokeless tobacco. Her smokeless tobacco use included snuff.  Objective Findings:  Vitals: Blood pressure 134/84, pulse (!) 104, height 5\' 3"  (1.6 m), weight 262 lb (118.8 kg), last menstrual period 07/04/2017.  PHYSICAL EXAMINATION General appearance - alert, well appearing, and in no distress, oriented to person, place, and time and overweight Mental status - alert, oriented to person, place, and time, normal mood, behavior, speech, dress, motor activity, and thought processes Chest -  Heart -  Abdomen - nontender. Fibroid to near umbilicus. Breasts -  Skin - LESIONS NOTED: normal complete skin exam, no  suspicious lesions, skin abscess on the left inner thigh, several prior drainage sites, currently healed over but puffy , no local heat, no fluctuance.2 x 3 cm area  PELVIC External genitalia - nefg Vulva - normal  Vagina - normal  Cervix - nonpurulent, pap done  Uterus - enlarged to 16+ wk  Adnexa -  Wet [place] -  Rectal -     Assessment & Plan:   A:  1.  NO pap in 9 yrs documented 2. Left thigh cellulitis, chronic  P:  1.  pap done 2. Rx Doxycycline x 10 d

## 2017-07-25 LAB — TYPE AND SCREEN
ABO/RH(D): A POS
Antibody Screen: POSITIVE
UNIT DIVISION: 0
UNIT DIVISION: 0

## 2017-07-25 LAB — BPAM RBC
Blood Product Expiration Date: 201906112359
Unit Type and Rh: 6200

## 2017-07-25 NOTE — H&P (Signed)
Pt was to be scheduled for Abdominal supracervical hysterectomy this morning, 07/24/17, but the patient's report of having had a pap at the [facility] could not be confirmed, so preop data incomplete. Pt scheduled for the office  10:30 am, for exam and pap, so once results complete, surgery to be rescheduled.

## 2017-07-26 LAB — CYTOLOGY - PAP
Adequacy: ABSENT
CHLAMYDIA, DNA PROBE: NEGATIVE
DIAGNOSIS: NEGATIVE
HPV (WINDOPATH): NOT DETECTED
Neisseria Gonorrhea: NEGATIVE

## 2017-07-30 ENCOUNTER — Emergency Department (HOSPITAL_COMMUNITY)
Admission: EM | Admit: 2017-07-30 | Discharge: 2017-07-30 | Disposition: A | Discharge status: Home / Self Care | Payer: Medicaid Other | Attending: Emergency Medicine | Admitting: Emergency Medicine

## 2017-07-30 ENCOUNTER — Emergency Department (HOSPITAL_COMMUNITY): Payer: Medicaid Other

## 2017-07-30 ENCOUNTER — Encounter (HOSPITAL_COMMUNITY): Payer: Self-pay | Admitting: Emergency Medicine

## 2017-07-30 DIAGNOSIS — I129 Hypertensive chronic kidney disease with stage 1 through stage 4 chronic kidney disease, or unspecified chronic kidney disease: Secondary | ICD-10-CM | POA: Diagnosis not present

## 2017-07-30 DIAGNOSIS — R2241 Localized swelling, mass and lump, right lower limb: Secondary | ICD-10-CM | POA: Insufficient documentation

## 2017-07-30 DIAGNOSIS — Y998 Other external cause status: Secondary | ICD-10-CM | POA: Diagnosis not present

## 2017-07-30 DIAGNOSIS — N189 Chronic kidney disease, unspecified: Secondary | ICD-10-CM | POA: Diagnosis not present

## 2017-07-30 DIAGNOSIS — Y9389 Activity, other specified: Secondary | ICD-10-CM | POA: Insufficient documentation

## 2017-07-30 DIAGNOSIS — S92413A Displaced fracture of proximal phalanx of unspecified great toe, initial encounter for closed fracture: Secondary | ICD-10-CM | POA: Insufficient documentation

## 2017-07-30 DIAGNOSIS — E039 Hypothyroidism, unspecified: Secondary | ICD-10-CM | POA: Insufficient documentation

## 2017-07-30 DIAGNOSIS — Z79899 Other long term (current) drug therapy: Secondary | ICD-10-CM | POA: Diagnosis not present

## 2017-07-30 DIAGNOSIS — Y929 Unspecified place or not applicable: Secondary | ICD-10-CM | POA: Diagnosis not present

## 2017-07-30 DIAGNOSIS — W231XXA Caught, crushed, jammed, or pinched between stationary objects, initial encounter: Secondary | ICD-10-CM | POA: Diagnosis not present

## 2017-07-30 DIAGNOSIS — Z87891 Personal history of nicotine dependence: Secondary | ICD-10-CM | POA: Insufficient documentation

## 2017-07-30 DIAGNOSIS — S99922A Unspecified injury of left foot, initial encounter: Secondary | ICD-10-CM | POA: Diagnosis present

## 2017-07-30 LAB — BASIC METABOLIC PANEL
ANION GAP: 11 (ref 5–15)
BUN: 7 mg/dL (ref 6–20)
CHLORIDE: 102 mmol/L (ref 101–111)
CO2: 25 mmol/L (ref 22–32)
Calcium: 9.7 mg/dL (ref 8.9–10.3)
Creatinine, Ser: 0.84 mg/dL (ref 0.44–1.00)
GFR calc Af Amer: >60 mL/min (ref >60)
GFR calc non Af Amer: >60 mL/min (ref >60)
GLUCOSE: 93 mg/dL (ref 65–99)
Potassium: 3.8 mmol/L (ref 3.5–5.1)
Sodium: 138 mmol/L (ref 135–145)

## 2017-07-30 LAB — CBC WITH DIFFERENTIAL/PLATELET
BASOS ABS: 0 10*3/uL (ref 0.0–0.1)
BASOS PCT: 0 %
EOS ABS: 0.1 10*3/uL (ref 0.0–0.7)
EOS PCT: 1 %
HEMATOCRIT: 40.7 % (ref 36.0–46.0)
HEMOGLOBIN: 12.8 g/dL (ref 12.0–15.0)
Lymphocytes Relative: 18 %
Lymphs Abs: 1.5 10*3/uL (ref 0.7–4.0)
MCH: 27.9 pg (ref 26.0–34.0)
MCHC: 31.4 g/dL (ref 30.0–36.0)
MCV: 88.7 fL (ref 78.0–100.0)
MONO ABS: 0.6 10*3/uL (ref 0.1–1.0)
MONOS PCT: 7 %
Neutro Abs: 5.8 10*3/uL (ref 1.7–7.7)
Neutrophils Relative %: 74 %
Platelets: 117 10*3/uL — ABNORMAL LOW (ref 150–400)
RBC: 4.59 MIL/uL (ref 3.87–5.11)
RDW: 17.7 % — ABNORMAL HIGH (ref 11.5–15.5)
WBC: 7.9 10*3/uL (ref 4.0–10.5)

## 2017-07-30 MED ORDER — SULFAMETHOXAZOLE-TRIMETHOPRIM 800-160 MG PO TABS: 1.0000 | ORAL_TABLET | Freq: Two times a day (BID) | ORAL | 0 refills | Status: AC

## 2017-07-30 MED ORDER — TETANUS-DIPHTH-ACELL PERTUSSIS 5-2.5-18.5 LF-MCG/0.5 IM SUSP
0.5000 mL | Freq: Once | INTRAMUSCULAR | Status: AC
  Administered 2017-07-30: 0.5 mL via INTRAMUSCULAR
  Filled 2017-07-30: qty 0.5

## 2017-07-30 MED ORDER — TRAMADOL HCL 50 MG PO TABS: 50.0000 mg | ORAL_TABLET | Freq: Four times a day (QID) | ORAL | 0 refills | Status: DC | PRN

## 2017-07-30 NOTE — Discharge Instructions (Addendum)
Follow-up with Dr. Aline Brochure later this week.

## 2017-07-30 NOTE — ED Notes (Signed)
Pt says her ride has to leave now to get to work.  Pt verbalized understanding of d/c instructions.  Pt refused wound care, discharge vitals, and shot wait time.  Pt says she will clean her foot when she gets home.  Instructed pt how to apply post op shoe.  Pt verbalized understanding.

## 2017-07-30 NOTE — ED Provider Notes (Signed)
[facility] ([facility]) EMERGENCY DEPARTMENT Provider Note   CSN: [id number] Arrival date & time: 07/30/17  1156     History   Chief Complaint Chief Complaint  Patient presents with  . Leg Pain    HPI Whitney Bush is a 47 y.o. female.  Patient states a few days ago somebody stepped hard on her foot.  She has pain in her right big toe and swelling in her right calf  The history is provided by the patient. No language interpreter was used.  Leg Pain   This is a new problem. The current episode started more than 2 days ago. The problem occurs rarely. The problem has been resolved. Pain location: Right great toe. The quality of the pain is described as aching. The pain is at a severity of 5/10. The pain is moderate. Associated symptoms include limited range of motion. She has tried nothing for the symptoms. The treatment provided no relief.    Past Medical History:  Diagnosis Date  . Alcohol abuse   . 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 Anemia   . Blood transfusion without reported diagnosis   . Boil 06/04/2015  . Chronic kidney disease   . Depression   . GERD (gastroesophageal reflux disease)   . GI (gastrointestinal bleed) 07/28/2015  . Heart murmur   . Hyperlipidemia   . Hypertension   . Hypothyroidism   . Migraines   . OCD (obsessive compulsive disorder)   . Peripheral edema   . PTSD (post-traumatic stress disorder)   . Tubular adenoma     Patient Active Problem List   Diagnosis Date Noted  . Anemia due to chronic blood loss 06/07/2017  . Symptomatic anemia 06/03/2017  . Abnormal uterine bleeding (AUB) 02/12/2017  . Acute blood loss anemia 11/27/2016  . Thickened endometrium 11/23/2016  . Uterine leiomyoma 11/23/2016  . Menorrhagia with regular cycle 11/23/2016  . Alcoholic cirrhosis of liver ([place]) 07/07/2016  . Esophageal dysphagia 07/07/2016  . Esophageal varices without bleeding ([place]) 07/07/2016  . IDA (iron deficiency anemia)  06/09/2016  . PTSD (post-traumatic stress disorder) 04/20/2016  . OCD (obsessive compulsive disorder) 04/20/2016  . Severe recurrent major depression without psychotic features ([place]) 04/19/2016  . GERD (gastroesophageal reflux disease) 03/09/2016  . Depression 02/07/2016  . Bilateral edema of lower extremity   . History of colonic polyps   . Alcoholic cirrhosis of liver with ascites ([place])   . Anasarca 02/16/2015  . Alcoholic hepatitis with ascites   . Ascites   . Bleeding gastrointestinal   . AKI (acute kidney injury) ([place]) 12/10/2014  . Volume overload 12/10/2014  . Alcoholic cirrhosis ([place])   . Elevated bilirubin   . Nausea with vomiting   . Diarrhea   . Absolute anemia 12/02/2014  . Jaundice 12/02/2014  . Essential hypertension 12/02/2014  . Alcohol use disorder, moderate, dependence ([place]) 12/02/2014  . Abdominal pain 12/02/2014    Past Surgical History:  Procedure Laterality Date  . ABCESS DRAINAGE     x5; neck, arm, chest, back  . BIOPSY  02/24/2015   Procedure: BIOPSY;  Surgeon: Danie Binder, MD;  [facility];  Service: Endoscopy;;  gastric bx's  . BIOPSY  12/25/2016   Procedure: BIOPSY;  Surgeon: Daneil Dolin, MD;  [facility];  Service: Endoscopy;;  gastric  . CESAREAN SECTION    . COLONOSCOPY WITH PROPOFOL N/A 06/28/2015   Dr.Rourk- inadequate prep- One 6  mm polyp at the splenic flexure bx= tubular adenoma  . ESOPHAGEAL BANDING N/A 12/25/2016   Procedure: ESOPHAGEAL BANDING with Propofol;  Surgeon: Daneil Dolin, MD;  [facility];  Service: Endoscopy;  Laterality: N/A;  possible banding of varices  . ESOPHAGOGASTRODUODENOSCOPY (EGD) WITH PROPOFOL N/A 02/24/2015   SLF: Grade II esophageal varices Moderate portal hypertensive gastropathy Mild erosive gastritis anemia likely due to many factors: gastritis, gastropathy, coagulopathy, chronic disease  . ESOPHAGOGASTRODUODENOSCOPY (EGD) WITH PROPOFOL N/A 12/25/2016   Procedure:  ESOPHAGOGASTRODUODENOSCOPY (EGD) WITH PROPOFOL;  Surgeon: Daneil Dolin, MD;  [facility];  Service: Endoscopy;  Laterality: N/A;  8:45am  . MALONEY DILATION N/A 12/25/2016   Procedure: Venia Minks DILATION;  Surgeon: Daneil Dolin, MD;  [facility];  Service: Endoscopy;  Laterality: N/A;  . POLYPECTOMY  06/28/2015   Procedure: POLYPECTOMY;  Surgeon: Daneil Dolin, MD;  [facility];  Service: Endoscopy;;  at splenic flexure     OB History    Gravida  2   Para  1   Term  1   Preterm      AB  1   Living  1     SAB      TAB      Ectopic      Multiple      Live Births  1            Home Medications    Prior to Admission medications   Medication Sig Start Date End Date Taking? Authorizing Provider  B Complex-C (SUPER B COMPLEX PO) Take 1 tablet by mouth daily.    Yes [provider]  doxycycline (VIBRAMYCIN) 100 MG capsule Take 1 capsule (100 mg total) by mouth 2 (two) times daily. 07/24/17  Yes Jonnie Kind, MD  sulfamethoxazole-trimethoprim (BACTRIM DS,SEPTRA DS) 800-160 MG tablet Take 1 tablet by mouth 2 (two) times daily for 7 days. 07/30/17 08/06/17  Milton Ferguson, MD  traMADol (ULTRAM) 50 MG tablet Take 1 tablet (50 mg total) by mouth every 6 (six) hours as needed. 07/30/17   Milton Ferguson, MD    Family History Family History  Problem Relation Age of Onset  . Diabetes Father   . Hyperlipidemia Father   . Alcohol abuse Father   . Cancer Other   . Cervical cancer Maternal Grandmother   . Lung cancer Maternal Grandfather   . Alcohol abuse Other        multiple family members  . Diabetes Sister   . Hypertension Brother   . Colon cancer Neg Hx   . Liver disease Neg Hx     Social History Social History   Tobacco Use  . Smoking status: Former Smoker    Packs/day: 1.00    Years: 5.00    Pack years: 5.00    Types: Cigarettes    Last attempt to quit: 12/29/2010    Years since quitting: 6.5  . Smokeless  tobacco: Former Systems developer    Types: Snuff  Substance Use Topics  . Alcohol use: Yes    Comment: "sometimes"  . Drug use: No     Allergies   Patient has no known allergies.   Review of Systems Review of Systems  Constitutional: Negative for appetite change and fatigue.  HENT: Negative for congestion, ear discharge and sinus pressure.   Eyes: Negative for discharge.  Respiratory: Negative for cough.   Cardiovascular: Negative for chest pain.  Gastrointestinal: Negative for abdominal pain and diarrhea.  Genitourinary: Negative for frequency and hematuria.  Musculoskeletal: Negative for back pain.       Pain in right great toe and calf  Skin: Negative for rash.  Neurological: Negative for seizures and headaches.  Psychiatric/Behavioral: Negative for hallucinations.     Physical Exam Updated Vital Signs BP (!) 158/111 Comment: did not take meds  Pulse 93   Temp 98.7 F (37.1 C) (Oral)   Resp 16   Ht 5\' 3"  (1.6 m)   Wt 118.8 kg (262 lb)   LMP 07/04/2017   SpO2 100%   BMI 46.41 kg/m   Physical Exam  Constitutional: She is oriented to person, place, and time. She appears well-developed.  HENT:  Head: Normocephalic.  Eyes: Conjunctivae are normal.  Neck: No tracheal deviation present.  Cardiovascular:  No murmur heard. Musculoskeletal:  Right great toe swollen tender blood under the toenail.  Right calf mildly swollen  Neurological: She is oriented to person, place, and time.  Skin: Skin is warm.  Psychiatric: She has a normal mood and affect.     ED Treatments / Results  Labs (all labs ordered are listed, but only abnormal results are displayed) Labs Reviewed  CBC WITH DIFFERENTIAL/PLATELET - Abnormal; Notable for the following components:      Result Value   RDW 17.7 (*)    Platelets 117 (*)    All other components within normal limits  BASIC METABOLIC PANEL    EKG None  Radiology US Venous Img Lower Unilateral Right  Result Date: 07/30/2017 CLINICAL  DATA:  Right lower extremity pain and edema. History of smoking. Evaluate for DVT. EXAM: RIGHT LOWER EXTREMITY VENOUS DOPPLER ULTRASOUND TECHNIQUE: Gray-scale sonography with graded compression, as well as color Doppler and duplex ultrasound were performed to evaluate the lower extremity deep venous systems from the level of the common femoral vein and including the common femoral, femoral, profunda femoral, popliteal and calf veins including the posterior tibial, peroneal and gastrocnemius veins when visible. The superficial great saphenous vein was also interrogated. Spectral Doppler was utilized to evaluate flow at rest and with distal augmentation maneuvers in the common femoral, femoral and popliteal veins. COMPARISON:  None. FINDINGS: Contralateral Common Femoral Vein: Respiratory phasicity is normal and symmetric with the symptomatic side. No evidence of thrombus. Normal compressibility. Common Femoral Vein: No evidence of thrombus. Normal compressibility, respiratory phasicity and response to augmentation. Saphenofemoral Junction: No evidence of thrombus. Normal compressibility and flow on color Doppler imaging. Profunda Femoral Vein: No evidence of thrombus. Normal compressibility and flow on color Doppler imaging. Femoral Vein: No evidence of thrombus. Normal compressibility, respiratory phasicity and response to augmentation. Popliteal Vein: No evidence of thrombus. Normal compressibility, respiratory phasicity and response to augmentation. Calf Veins: No evidence of thrombus. Normal compressibility and flow on color Doppler imaging. Superficial Great Saphenous Vein: No evidence of thrombus. Normal compressibility. Venous Reflux:  None. Other Findings:  None. IMPRESSION: No evidence of DVT within the right lower extremity. Electronically Signed   By: Sandi Mariscal M.D.   On: 07/30/2017 15:05   Dg Toe Great Right  Result Date: 07/30/2017 CLINICAL DATA:  Toe stepped on 3 days ago.  Right toe pain. EXAM:  RIGHT GREAT TOE COMPARISON:  None. FINDINGS: A comminuted fracture is present along the dorsal and lateral aspect of the base of the distal phalanx in the great toe. Associated soft tissue swelling is present. The joint is located. No additional fractures are present. The dorsal fragment is retracted. No radiopaque foreign body is  present. IMPRESSION: 1. Comminuted fracture at the base of the distal phalanx in the great toe with partial retraction of the dorsal fragment. 2. Associated soft tissue swelling. 3. The interphalangeal joint is located. No additional fractures are present. Electronically Signed   By: San Morelle M.D.   On: 07/30/2017 15:07    Procedures Procedures (including critical care time)  Medications Ordered in ED Medications  Tdap (BOOSTRIX) injection 0.5 mL (has no administration in time range)     Initial Impression / Assessment and Plan / ED Course  I have reviewed the triage vital signs and the nursing notes.  Pertinent labs & imaging results that were available during my care of the patient were reviewed by me and considered in my medical decision making (see chart for details). Fracture of the base of the distal phalanx of the right great toe.  Patient will be put in a postop shoe given pain medicines and antibiotics and referred to orthopedics.  Ultrasound of the calf was negative  Final Clinical Impressions(s) / ED Diagnoses   Final diagnoses:  Displaced fracture of proximal phalanx of unspecified great toe, initial encounter for closed fracture    ED Discharge Orders        Ordered    sulfamethoxazole-trimethoprim (BACTRIM DS,SEPTRA DS) 800-160 MG tablet  2 times daily     07/30/17 1559    traMADol (ULTRAM) 50 MG tablet  Every 6 hours PRN     07/30/17 1559       Milton Ferguson, MD 07/30/17 1606

## 2017-07-30 NOTE — ED Triage Notes (Signed)
Pt reports having an injury to right great toe with swelling of right leg up to knee now.  Pt states it feels like her toenail is going to fall off.

## 2017-08-01 ENCOUNTER — Encounter: Payer: Medicaid Other | Admitting: Obstetrics and Gynecology

## 2017-08-01 ENCOUNTER — Ambulatory Visit: Payer: Medicaid Other | Admitting: Obstetrics and Gynecology

## 2017-08-01 ENCOUNTER — Telehealth: Payer: Self-pay | Admitting: Orthopaedic Surgery

## 2017-08-01 NOTE — Telephone Encounter (Signed)
Call received from patient following Emergency room visit at [facility] for problem fracture of right great toe. Patient said her toenail is also involved.  Offered appointment upon receipt of referral authorization from primary care, per her insurer. Also sent faxed note to primary care at [facility] - patient said she will call there, and call us back.  Appointment pending.

## 2017-08-06 NOTE — Telephone Encounter (Signed)
I have called this patient and left messages on both of her phones to call our office. We have received authorization from her PCP.

## 2017-08-06 NOTE — Telephone Encounter (Signed)
Appointment has been scheduled; patient aware.

## 2017-08-07 ENCOUNTER — Ambulatory Visit: Payer: Medicaid Other | Admitting: Orthopaedic Surgery

## 2017-08-07 ENCOUNTER — Ambulatory Visit (INDEPENDENT_AMBULATORY_CARE_PROVIDER_SITE_OTHER): Payer: Medicaid Other

## 2017-08-07 ENCOUNTER — Encounter: Payer: Self-pay | Admitting: Orthopaedic Surgery

## 2017-08-07 VITALS — BP 144/99 | HR 89 | Ht 63.0 in | Wt 258.0 lb

## 2017-08-07 DIAGNOSIS — S92421A Displaced fracture of distal phalanx of right great toe, initial encounter for closed fracture: Secondary | ICD-10-CM

## 2017-08-07 DIAGNOSIS — S8011XA Contusion of right lower leg, initial encounter: Secondary | ICD-10-CM | POA: Diagnosis not present

## 2017-08-07 NOTE — Progress Notes (Signed)
Subjective:    Patient ID: Whitney Bush, female    DOB: 04/07/1970, 47 y.o.   MRN: [id number]  HPI She broke her right great toe 07-28-17 when she was horsing around.  She went to the ER on 07-30-17.  X-rays showed the fracture.  She also sustained a hematoma of the right lower medial leg.  She had a doppler study in the ER.  I have reviewed the ER records, the x-rays and the x-ray report.  She is wearing flip flops today, she says she did not like the post op shoe.  She has more pain from the hematoma.  It is not red.   Review of Systems  Constitutional: Positive for activity change.  Musculoskeletal: Positive for arthralgias, gait problem and joint swelling.  Neurological: Positive for headaches.  All other systems reviewed and are negative.  Past Medical History:  Diagnosis Date  . Alcohol abuse   . 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 Anemia   . Blood transfusion without reported diagnosis   . Boil 06/04/2015  . Chronic kidney disease   . Clotting disorder ([place])   . Depression   . GERD (gastroesophageal reflux disease)   . GI (gastrointestinal bleed) 07/28/2015  . Heart disease   . Heart murmur   . Hyperlipidemia   . Hypertension   . Hypothyroidism   . Migraines   . OCD (obsessive compulsive disorder)   . Peripheral edema   . PTSD (post-traumatic stress disorder)   . Tubular adenoma     Past Surgical History:  Procedure Laterality Date  . ABCESS DRAINAGE     x5; neck, arm, chest, back  . BIOPSY  02/24/2015   Procedure: BIOPSY;  Surgeon: Danie Binder, MD;  [facility];  Service: Endoscopy;;  gastric bx's  . BIOPSY  12/25/2016   Procedure: BIOPSY;  Surgeon: Daneil Dolin, MD;  [facility];  Service: Endoscopy;;  gastric  . CESAREAN SECTION    . COLONOSCOPY WITH PROPOFOL N/A 06/28/2015   Dr.Rourk- inadequate prep- One 6 mm polyp at the splenic flexure bx= tubular adenoma  . ESOPHAGEAL BANDING N/A 12/25/2016   Procedure: ESOPHAGEAL BANDING with Propofol;  Surgeon: Daneil Dolin, MD;  [facility];  Service: Endoscopy;  Laterality: N/A;  possible banding of varices  . ESOPHAGOGASTRODUODENOSCOPY (EGD) WITH PROPOFOL N/A 02/24/2015   SLF: Grade II esophageal varices Moderate portal hypertensive gastropathy Mild erosive gastritis anemia likely due to many factors: gastritis, gastropathy, coagulopathy, chronic disease  . ESOPHAGOGASTRODUODENOSCOPY (EGD) WITH PROPOFOL N/A 12/25/2016   Procedure: ESOPHAGOGASTRODUODENOSCOPY (EGD) WITH PROPOFOL;  Surgeon: Daneil Dolin, MD;  [facility];  Service: Endoscopy;  Laterality: N/A;  8:45am  . MALONEY DILATION N/A 12/25/2016   Procedure: Venia Minks DILATION;  Surgeon: Daneil Dolin, MD;  [facility];  Service: Endoscopy;  Laterality: N/A;  . POLYPECTOMY  06/28/2015   Procedure: POLYPECTOMY;  Surgeon: Daneil Dolin, MD;  [facility];  Service: Endoscopy;;  at splenic flexure    Current Outpatient Medications on File Prior to Visit  Medication Sig Dispense Refill  . B Complex-C (SUPER B COMPLEX PO) Take 1 tablet by mouth daily.     Marland Kitchen doxycycline (VIBRAMYCIN) 100 MG capsule Take 1 capsule (100 mg total) by mouth 2 (two) times daily. 14 capsule 1  . traMADol (ULTRAM) 50 MG tablet Take 1 tablet (50 mg total) by  mouth every 6 (six) hours as needed. 20 tablet 0   No current facility-administered medications on file prior to visit.     Social History   Socioeconomic History  . Marital status: Widowed    Spouse name: Not on file  . Number of children: 1  . Years of education: 55  . Highest education level: Not on file  Occupational History  . Occupation: unemployed  Social Needs  . Financial resource strain: Not on file  . Food insecurity:    Worry: Not on file    Inability: Not on file  . Transportation needs:    Medical: Not on file    Non-medical: Not on file  Tobacco Use  . Smoking status: Former Smoker     Packs/day: 1.00    Years: 5.00    Pack years: 5.00    Types: Cigarettes    Last attempt to quit: 12/29/2010    Years since quitting: 6.6  . Smokeless tobacco: Former Systems developer    Types: Snuff  Substance and Sexual Activity  . Alcohol use: Yes    Comment: "sometimes"  . Drug use: No  . Sexual activity: Not Currently    Birth control/protection: None  Lifestyle  . Physical activity:    Days per week: Not on file    Minutes per session: Not on file  . Stress: Not on file  Relationships  . Social connections:    Talks on phone: Not on file    Gets together: Not on file    Attends religious service: Not on file    Active member of club or organization: Not on file    Attends meetings of clubs or organizations: Not on file    Relationship status: Not on file  . Intimate partner violence:    Fear of current or ex partner: Not on file    Emotionally abused: Not on file    Physically abused: Not on file    Forced sexual activity: Not on file  Other Topics Concern  . Not on file  Social History Narrative   Lives with parents    Family History  Problem Relation Age of Onset  . Diabetes Father   . Hyperlipidemia Father   . Alcohol abuse Father   . Hypertension Father   . Cancer Other   . Cervical cancer Maternal Grandmother   . Lung cancer Maternal Grandfather   . Alcohol abuse Other        multiple family members  . Diabetes Sister   . Hypertension Sister   . Hypertension Brother   . Colon cancer Neg Hx   . Liver disease Neg Hx     BP (!) 144/99   Pulse 89   Ht 5\' 3"  (1.6 m)   Wt 258 lb (117 kg)   BMI 45.70 kg/m       Objective:   Physical Exam  Constitutional: She is oriented to person, place, and time. She appears well-developed and well-nourished.  HENT:  Head: Normocephalic and atraumatic.  Eyes: Pupils are equal, round, and reactive to light. Conjunctivae and EOM are normal.  Neck: Normal range of motion. Neck supple.  Cardiovascular: Normal rate, regular  rhythm and intact distal pulses.  Pulmonary/Chest: Effort normal.  Abdominal: Soft.  Musculoskeletal:       Legs:      Feet:  Neurological: She is alert and oriented to person, place, and time. She has normal reflexes. She displays normal reflexes. No cranial nerve deficit. She exhibits  normal muscle tone. Coordination normal.  Skin: Skin is warm and dry.  Psychiatric: She has a normal mood and affect. Her behavior is normal. Judgment and thought content normal.          Assessment & Plan:   Encounter Diagnoses  Name Primary?  . Closed displaced fracture of distal phalanx of right great toe, initial encounter Yes  . Hematoma of leg, right, initial encounter    Use heat to the hematoma.  I talked to her about shoe wear.  Return in one month.  Call if any problem.  Precautions discussed.   Electronically Signed Sanjuana Kava, MD 6/11/20199:41 AM

## 2017-08-08 ENCOUNTER — Ambulatory Visit: Payer: Medicaid Other | Admitting: Obstetrics and Gynecology

## 2017-08-15 ENCOUNTER — Ambulatory Visit: Payer: Medicaid Other | Admitting: Obstetrics and Gynecology

## 2017-09-03 ENCOUNTER — Ambulatory Visit: Payer: Medicaid Other | Admitting: Obstetrics and Gynecology

## 2017-09-04 ENCOUNTER — Encounter: Payer: Self-pay | Admitting: Orthopaedic Surgery

## 2017-09-04 ENCOUNTER — Ambulatory Visit: Payer: Medicaid Other | Admitting: Orthopaedic Surgery

## 2017-09-05 ENCOUNTER — Telehealth: Payer: Self-pay | Admitting: Obstetrics and Gynecology

## 2017-09-05 ENCOUNTER — Ambulatory Visit: Payer: Medicaid Other | Admitting: Obstetrics and Gynecology

## 2017-09-05 NOTE — Telephone Encounter (Signed)
Pt did not keep appt this a.m., and reports that it was due to NOT HAVING A RIDE.  Pt asked to let us know in the future in advance of appt whenever possible, so that appt can be given to another pt. Pt lives in [place], has no car, will reschedule when transport thru community transport available.

## 2017-09-12 ENCOUNTER — Ambulatory Visit: Payer: Medicaid Other | Admitting: Orthopaedic Surgery

## 2017-09-12 ENCOUNTER — Encounter: Payer: Self-pay | Admitting: Orthopaedic Surgery

## 2017-09-12 ENCOUNTER — Ambulatory Visit (INDEPENDENT_AMBULATORY_CARE_PROVIDER_SITE_OTHER): Payer: Medicaid Other

## 2017-09-12 VITALS — BP 134/84 | HR 88 | Ht 63.0 in | Wt 258.0 lb

## 2017-09-12 DIAGNOSIS — S92421D Displaced fracture of distal phalanx of right great toe, subsequent encounter for fracture with routine healing: Secondary | ICD-10-CM

## 2017-09-12 DIAGNOSIS — S8011XA Contusion of right lower leg, initial encounter: Secondary | ICD-10-CM | POA: Diagnosis not present

## 2017-09-12 NOTE — Progress Notes (Signed)
Patient IP:Whitney Bush, female DOB:14-Sep-1970, 47 y.o. QBH:[id number]  Chief Complaint  Patient presents with  . Follow-up    Right great toe fx 07/28/17    HPI  Whitney Bush is a 47 y.o. female who has a fracture of the right great toe and a hematoma of the right lower leg anteriorly. She is better.  The nail came off the great toe.  She is covering it with a BandAid.  She has less swelling from the hematoma.  She has no redness. She is walking well.   Body mass index is 45.7 kg/m.  ROS  Review of Systems  Constitutional: Positive for activity change.  Musculoskeletal: Positive for arthralgias, gait problem and joint swelling.  Neurological: Positive for headaches.  All other systems reviewed and are negative.   All other systems reviewed and are negative.  Past Medical History:  Diagnosis Date  . Alcohol abuse   . 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 Anemia   . Blood transfusion without reported diagnosis   . Boil 06/04/2015  . Chronic kidney disease   . Clotting disorder ([place])   . Depression   . GERD (gastroesophageal reflux disease)   . GI (gastrointestinal bleed) 07/28/2015  . Heart disease   . Heart murmur   . Hyperlipidemia   . Hypertension   . Hypothyroidism   . Migraines   . OCD (obsessive compulsive disorder)   . Peripheral edema   . PTSD (post-traumatic stress disorder)   . Tubular adenoma     Past Surgical History:  Procedure Laterality Date  . ABCESS DRAINAGE     x5; neck, arm, chest, back  . BIOPSY  02/24/2015   Procedure: BIOPSY;  Surgeon: Danie Binder, MD;  [facility];  Service: Endoscopy;;  gastric bx's  . BIOPSY  12/25/2016   Procedure: BIOPSY;  Surgeon: Daneil Dolin, MD;  [facility];  Service: Endoscopy;;  gastric  . CESAREAN SECTION    . COLONOSCOPY WITH PROPOFOL N/A 06/28/2015   Dr.Rourk- inadequate prep- One 6 mm polyp at the splenic flexure bx= tubular adenoma  .  ESOPHAGEAL BANDING N/A 12/25/2016   Procedure: ESOPHAGEAL BANDING with Propofol;  Surgeon: Daneil Dolin, MD;  [facility];  Service: Endoscopy;  Laterality: N/A;  possible banding of varices  . ESOPHAGOGASTRODUODENOSCOPY (EGD) WITH PROPOFOL N/A 02/24/2015   SLF: Grade II esophageal varices Moderate portal hypertensive gastropathy Mild erosive gastritis anemia likely due to many factors: gastritis, gastropathy, coagulopathy, chronic disease  . ESOPHAGOGASTRODUODENOSCOPY (EGD) WITH PROPOFOL N/A 12/25/2016   Procedure: ESOPHAGOGASTRODUODENOSCOPY (EGD) WITH PROPOFOL;  Surgeon: Daneil Dolin, MD;  [facility];  Service: Endoscopy;  Laterality: N/A;  8:45am  . MALONEY DILATION N/A 12/25/2016   Procedure: Venia Minks DILATION;  Surgeon: Daneil Dolin, MD;  [facility];  Service: Endoscopy;  Laterality: N/A;  . POLYPECTOMY  06/28/2015   Procedure: POLYPECTOMY;  Surgeon: Daneil Dolin, MD;  [facility];  Service: Endoscopy;;  at splenic flexure    Family History  Problem Relation Age of Onset  . Diabetes Father   . Hyperlipidemia Father   . Alcohol abuse Father   . Hypertension Father   . Cancer Other   . Cervical cancer Maternal Grandmother   . Lung cancer Maternal Grandfather   . Alcohol abuse Other        multiple family members  . Diabetes  Sister   . Hypertension Sister   . Hypertension Brother   . Colon cancer Neg Hx   . Liver disease Neg Hx     Social History Social History   Tobacco Use  . Smoking status: Former Smoker    Packs/day: 1.00    Years: 5.00    Pack years: 5.00    Types: Cigarettes    Last attempt to quit: 12/29/2010    Years since quitting: 6.7  . Smokeless tobacco: Former Systems developer    Types: Snuff  Substance Use Topics  . Alcohol use: Yes    Comment: "sometimes"  . Drug use: No    No Known Allergies  Current Outpatient Medications  Medication Sig Dispense Refill  . B Complex-C (SUPER B COMPLEX PO) Take 1  tablet by mouth daily.     Marland Kitchen doxycycline (VIBRAMYCIN) 100 MG capsule Take 1 capsule (100 mg total) by mouth 2 (two) times daily. 14 capsule 1  . traMADol (ULTRAM) 50 MG tablet Take 1 tablet (50 mg total) by mouth every 6 (six) hours as needed. 20 tablet 0   No current facility-administered medications for this visit.      Physical Exam  Blood pressure 134/84, pulse 88, height 5\' 3"  (1.6 m), weight 258 lb (117 kg), last menstrual period 09/12/2017.  Constitutional: overall normal hygiene, normal nutrition, well developed, normal grooming, normal body habitus. Assistive device:none  Musculoskeletal: gait and station Limp none, muscle tone and strength are normal, no tremors or atrophy is present.  .  Neurological: coordination overall normal.  Deep tendon reflex/nerve stretch intact.  Sensation normal.  Cranial nerves II-XII intact.   Skin:   Normal overall no scars, lesions, ulcers or rashes. No psoriasis.  Psychiatric: Alert and oriented x 3.  Recent memory intact, remote memory unclear.  Normal mood and affect. Well groomed.  Good eye contact.  Cardiovascular: overall no swelling, no varicosities, no edema bilaterally, normal temperatures of the legs and arms, no clubbing, cyanosis and good capillary refill.  Lymphatic: palpation is normal.  There is a small hematoma of the right medial lower leg that is resolving.  It is less tender and less prominent than the last visit. There is no redness.  The right great toe is not painful, she is wearing flip flops and has no limp.  All other systems reviewed and are negative   The patient has been educated about the nature of the problem(s) and counseled on treatment options.  The patient appeared to understand what I have discussed and is in agreement with it.  Encounter Diagnoses  Name Primary?  . Closed displaced fracture of distal phalanx of right great toe with routine healing, subsequent encounter Yes  . Hematoma of leg, right,  initial encounter    X-rays were done of the right great toe, reported separately.   PLAN Call if any problems.  Precautions discussed.  Continue current medications.   Return to clinic prn   Electronically Signed Sanjuana Kava, MD 7/17/20199:43 AM

## 2017-09-20 ENCOUNTER — Encounter: Payer: Self-pay | Admitting: Obstetrics and Gynecology

## 2017-09-20 ENCOUNTER — Ambulatory Visit (INDEPENDENT_AMBULATORY_CARE_PROVIDER_SITE_OTHER): Payer: Medicaid Other | Admitting: Obstetrics and Gynecology

## 2017-09-20 VITALS — BP 130/85 | HR 95 | Ht 63.0 in | Wt 255.4 lb

## 2017-09-20 DIAGNOSIS — D252 Subserosal leiomyoma of uterus: Secondary | ICD-10-CM | POA: Diagnosis not present

## 2017-09-20 DIAGNOSIS — Z01818 Encounter for other preprocedural examination: Secondary | ICD-10-CM | POA: Diagnosis not present

## 2017-09-20 DIAGNOSIS — D250 Submucous leiomyoma of uterus: Secondary | ICD-10-CM

## 2017-09-20 NOTE — Progress Notes (Signed)
Patient ID: Whitney Bush, female   DOB: 1970-12-14, 47 y.o.   MRN: [id number]    [facility] Visit  @DATE @            Patient name: Whitney Bush MRN [id number]  Date of birth: June 19, 1970  Preoperative History and Physical  Whitney Bush is a 47 y.o. G2P1011 here for surgical management of Abdominal Supracervical Hysterectomy and Bilateral Salpingectomy. For symptomatic uterine fibroids and heavy bleeding. Significant preoperative concerns.   PT has had an etOH problem, currently she denies etoh use. She had a C section but no other surgeries in the area.  Proposed surgery: Abdominal Supracervical Hysterectomy and Bilateral Salpingectomy  Past Medical History:  Diagnosis Date   Alcohol abuse    Alcoholic cirrhosis of liver (HCC)    immune to Hep A and Hep B   Anemia    Blood transfusion without reported diagnosis    Boil 06/04/2015   Chronic kidney disease    Clotting disorder (HCC)    Depression    GERD (gastroesophageal reflux disease)    GI (gastrointestinal bleed) 07/28/2015   Heart disease    Heart murmur    Hyperlipidemia    Hypertension    Hypothyroidism    Migraines    OCD (obsessive compulsive disorder)    Peripheral edema    PTSD (post-traumatic stress disorder)    Tubular adenoma    Past Surgical History:  Procedure Laterality Date   ABCESS DRAINAGE     x5; neck, arm, chest, back   BIOPSY  02/24/2015   Procedure: BIOPSY;  Surgeon: Danie Binder, MD;  [facility];  Service: Endoscopy;;  gastric bx's   BIOPSY  12/25/2016   Procedure: BIOPSY;  Surgeon: Daneil Dolin, MD;  [facility];  Service: Endoscopy;;  gastric   CESAREAN SECTION     COLONOSCOPY WITH PROPOFOL N/A 06/28/2015   Dr.Rourk- inadequate prep- One 6 mm polyp at the splenic flexure bx= tubular adenoma   ESOPHAGEAL BANDING N/A 12/25/2016   Procedure: ESOPHAGEAL BANDING with Propofol;  Surgeon: Daneil Dolin, MD;  [facility];  Service: Endoscopy;  Laterality: N/A;  possible banding of varices   ESOPHAGOGASTRODUODENOSCOPY (EGD) WITH PROPOFOL N/A 02/24/2015   SLF: Grade II esophageal varices Moderate portal hypertensive gastropathy Mild erosive gastritis anemia likely due to many factors: gastritis, gastropathy, coagulopathy, chronic disease   ESOPHAGOGASTRODUODENOSCOPY (EGD) WITH PROPOFOL N/A 12/25/2016   Procedure: ESOPHAGOGASTRODUODENOSCOPY (EGD) WITH PROPOFOL;  Surgeon: Daneil Dolin, MD;  [facility];  Service: Endoscopy;  Laterality: N/A;  8:45am   MALONEY DILATION N/A 12/25/2016   Procedure: Venia Minks DILATION;  Surgeon: Daneil Dolin, MD;  [facility];  Service: Endoscopy;  Laterality: N/A;   POLYPECTOMY  06/28/2015   Procedure: POLYPECTOMY;  Surgeon: Daneil Dolin, MD;  [facility];  Service: Endoscopy;;  at splenic flexure   OB History  Gravida Para Term Preterm AB Living  2 1 1   1 1   SAB TAB Ectopic Multiple Live Births          1    # Outcome Date GA Lbr Len/2nd Weight Sex Delivery Anes PTL Lv  2 AB           1 Term     F CS-LTranv   LIV  Patient denies any other pertinent gynecologic issues.   Current Outpatient Medications on File Prior to Visit  Medication Sig Dispense Refill  B Complex-C (SUPER B COMPLEX PO) Take 1 tablet by mouth daily.      doxycycline (VIBRAMYCIN) 100 MG capsule Take 1 capsule (100 mg total) by mouth 2 (two) times daily. (Patient not taking: Reported on 09/20/2017) 14 capsule 1   traMADol (ULTRAM) 50 MG tablet Take 1 tablet (50 mg total) by mouth every 6 (six) hours as needed. (Patient not taking: Reported on 09/20/2017) 20 tablet 0   No current facility-administered medications on file prior to visit.    No Known Allergies  Social History:   reports that she quit smoking about 6 years ago. Her smoking use included cigarettes. She has a 5.00 pack-year smoking history. She has quit using smokeless tobacco. Her smokeless  tobacco use included snuff. She reports that she drinks alcohol. She reports that she does not use drugs.  Family History  Problem Relation Age of Onset   Diabetes Father    Hyperlipidemia Father    Alcohol abuse Father    Hypertension Father    Cancer Other    Cervical cancer Maternal Grandmother    Lung cancer Maternal Grandfather    Alcohol abuse Other        multiple family members   Diabetes Sister    Hypertension Sister    Hypertension Brother    Colon cancer Neg Hx    Liver disease Neg Hx     Review of Systems: Noncontributory  PHYSICAL EXAM: Blood pressure 130/85, pulse 95, height 5\' 3"  (1.6 m), weight 255 lb 6.4 oz (115.8 kg), last menstrual period 09/12/2017. General appearance - alert, well appearing, and in no distress Abdomen - soft, nontender, nondistended, no masses or organomegaly Pelvic - examination  Pelvic exam: VULVA: normal appearing vulva with no masses, tenderness or lesions, VAGINA: normal appearing vagina with normal color and discharge, no lesions, CERVIX: tiny UTERUS: uterus is normal size, shape, consistency and nontender, ADNEXA: normal adnexa in size, nontender and no masses. Extremities - chronic sore on left thigh, peripheral pulses normal, no pedal edema, no clubbing or cyanosis  Labs: CBC Latest Ref Rng & Units 07/30/2017 07/18/2017 07/13/2017  WBC 4.0 - 10.5 K/uL 7.9 6.3 6.6  Hemoglobin 12.0 - 15.0 g/dL 12.8 12.0 12.2  Hematocrit 36.0 - 46.0 % 40.7 37.7 35.7  Platelets 150 - 400 K/uL 117(L) 96(L) 116(L)    CMP Latest Ref Rng & Units 07/30/2017 07/18/2017 07/13/2017  Glucose 65 - 99 mg/dL 93 111(H) 102(H)  BUN 6 - 20 mg/dL 7 6 8   Creatinine 0.44 - 1.00 mg/dL 0.84 0.76 0.98  Sodium 135 - 145 mmol/L 138 136 138  Potassium 3.5 - 5.1 mmol/L 3.8 3.6 3.6  Chloride 101 - 111 mmol/L 102 101 97  CO2 22 - 32 mmol/L 25 23 24   Calcium 8.9 - 10.3 mg/dL 9.7 9.1 10.1  Total Protein 6.5 - 8.1 g/dL - 8.3(H) 8.2  Total Bilirubin 0.3 - 1.2 mg/dL -  1.6(H) 1.4(H)  Alkaline Phos 38 - 126 U/L - 83 89  AST 15 - 41 U/L - 55(H) 47(H)  ALT 14 - 54 U/L - 45 42(H)    Imaging Studies: Dg Toe Great Right  Result Date: 09/12/2017 Clinical:  History of fracture of the right great toe X-rays were done of the right great toe, three views. There is a fracture of the distal phalanx of the great toe, lateral and dorsal with minimal displacement.  Bone quality is good. Impression:  Fracture of the right great toe distal phalanx. Electronically [place],  MD 7/17/20199:39 AM    Assessment: Patient Active Problem List   Diagnosis Date Noted   Anemia due to chronic blood loss 06/07/2017   Symptomatic anemia 06/03/2017   Abnormal uterine bleeding (AUB) 02/12/2017   Acute blood loss anemia 11/27/2016   Thickened endometrium 11/23/2016   Uterine leiomyoma 11/23/2016   Menorrhagia with regular cycle 61/95/0932   Alcoholic cirrhosis of liver ([place]) 07/07/2016   Esophageal dysphagia 07/07/2016   Esophageal varices without bleeding ([place]) 07/07/2016   IDA (iron deficiency anemia) 06/09/2016   PTSD (post-traumatic stress disorder) 04/20/2016   OCD (obsessive compulsive disorder) 04/20/2016   Severe recurrent major depression without psychotic features ([place]) 04/19/2016   GERD (gastroesophageal reflux disease) 03/09/2016   Depression 02/07/2016   Bilateral edema of lower extremity    History of colonic polyps    Alcoholic cirrhosis of liver with ascites ([place])    Anasarca 67/01/4579   Alcoholic hepatitis with ascites    Ascites    Bleeding gastrointestinal    AKI (acute kidney injury) ([place]) 12/10/2014   Volume overload 99/83/3825   Alcoholic cirrhosis (HCC)    Elevated bilirubin    Nausea with vomiting    Diarrhea    Absolute anemia 12/02/2014   Jaundice 12/02/2014   Essential hypertension 12/02/2014   Alcohol use disorder, moderate, dependence ([place]) 12/02/2014   Abdominal pain 12/02/2014     Plan: 1) Patient will undergo surgical management with Abdominal Supracervical Hysterectomy and Bilateral Salpingectomy 2) Schedule surgery , probably for 10/02/2017.   By signing my name below, I, De Burrs, attest that this documentation has been prepared under the direction and in the presence of Jonnie Kind, MD. Electronically Signed: De Burrs, Medical Scribe. 09/20/17. 11:24 AM.  I personally performed the services described in this documentation, which was SCRIBED in my presence. The recorded information has been reviewed and considered accurate. It has been edited as necessary during review. Jonnie Kind, MD

## 2017-09-25 ENCOUNTER — Other Ambulatory Visit: Payer: Self-pay | Admitting: Obstetrics and Gynecology

## 2017-09-25 NOTE — Progress Notes (Signed)
Preoperative History and Physical  Whitney Bush is a 47 y.o. G2P1011 here for surgical management of Abdominal Supracervical Hysterectomy and Bilateral Salpingectomy. For symptomatic uterine fibroids and heavy bleeding. Significant preoperative concerns.   PT has had an etOH problem, currently she denies etoh use. She had a C section but no other surgeries in the area.  Proposed surgery: Abdominal Supracervical Hysterectomy and Bilateral Salpingectomy      Past Medical History:  Diagnosis Date  . Alcohol abuse   . 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 Anemia   . Blood transfusion without reported diagnosis   . Boil 06/04/2015  . Chronic kidney disease   . Clotting disorder ([place])   . Depression   . GERD (gastroesophageal reflux disease)   . GI (gastrointestinal bleed) 07/28/2015  . Heart disease   . Heart murmur   . Hyperlipidemia   . Hypertension   . Hypothyroidism   . Migraines   . OCD (obsessive compulsive disorder)   . Peripheral edema   . PTSD (post-traumatic stress disorder)   . Tubular adenoma         Past Surgical History:  Procedure Laterality Date  . ABCESS DRAINAGE     x5; neck, arm, chest, back  . BIOPSY  02/24/2015   Procedure: BIOPSY;  Surgeon: Danie Binder, MD;  [facility];  Service: Endoscopy;;  gastric bx's  . BIOPSY  12/25/2016   Procedure: BIOPSY;  Surgeon: Daneil Dolin, MD;  [facility];  Service: Endoscopy;;  gastric  . CESAREAN SECTION    . COLONOSCOPY WITH PROPOFOL N/A 06/28/2015   Dr.Rourk- inadequate prep- One 6 mm polyp at the splenic flexure bx= tubular adenoma  . ESOPHAGEAL BANDING N/A 12/25/2016   Procedure: ESOPHAGEAL BANDING with Propofol;  Surgeon: Daneil Dolin, MD;  [facility];  Service: Endoscopy;  Laterality: N/A;  possible banding of varices  . ESOPHAGOGASTRODUODENOSCOPY (EGD) WITH PROPOFOL N/A 02/24/2015   SLF: Grade II  esophageal varices Moderate portal hypertensive gastropathy Mild erosive gastritis anemia likely due to many factors: gastritis, gastropathy, coagulopathy, chronic disease  . ESOPHAGOGASTRODUODENOSCOPY (EGD) WITH PROPOFOL N/A 12/25/2016   Procedure: ESOPHAGOGASTRODUODENOSCOPY (EGD) WITH PROPOFOL;  Surgeon: Daneil Dolin, MD;  [facility];  Service: Endoscopy;  Laterality: N/A;  8:45am  . MALONEY DILATION N/A 12/25/2016   Procedure: Venia Minks DILATION;  Surgeon: Daneil Dolin, MD;  [facility];  Service: Endoscopy;  Laterality: N/A;  . POLYPECTOMY  06/28/2015   Procedure: POLYPECTOMY;  Surgeon: Daneil Dolin, MD;  [facility];  Service: Endoscopy;;  at splenic flexure                   OB History  Gravida Para Term Preterm AB Living  2 1 1   1 1   SAB TAB Ectopic Multiple Live Births             1       # Outcome Date GA Lbr Len/2nd Weight Sex Delivery Anes PTL Lv  2 AB           1 Term     F CS-LTranv   LIV  Patient denies any other pertinent gynecologic issues.         Current Outpatient Medications on File Prior to Visit  Medication Sig Dispense Refill  . B Complex-C (SUPER B COMPLEX PO) Take 1 tablet by mouth  daily.     . doxycycline (VIBRAMYCIN) 100 MG capsule Take 1 capsule (100 mg total) by mouth 2 (two) times daily. (Patient not taking: Reported on 09/20/2017) 14 capsule 1  . traMADol (ULTRAM) 50 MG tablet Take 1 tablet (50 mg total) by mouth every 6 (six) hours as needed. (Patient not taking: Reported on 09/20/2017) 20 tablet 0   No current facility-administered medications on file prior to visit.    No Known Allergies  Social History:   reports that she quit smoking about 6 years ago. Her smoking use included cigarettes. She has a 5.00 pack-year smoking history. She has quit using smokeless tobacco. Her smokeless tobacco use included snuff. She reports that she drinks alcohol. She reports that she does not use  drugs.       Family History  Problem Relation Age of Onset  . Diabetes Father   . Hyperlipidemia Father   . Alcohol abuse Father   . Hypertension Father   . Cancer Other   . Cervical cancer Maternal Grandmother   . Lung cancer Maternal Grandfather   . Alcohol abuse Other        multiple family members  . Diabetes Sister   . Hypertension Sister   . Hypertension Brother   . Colon cancer Neg Hx   . Liver disease Neg Hx     Review of Systems: Noncontributory  PHYSICAL EXAM: Blood pressure 130/85, pulse 95, height 5\' 3"  (1.6 m), weight 255 lb 6.4 oz (115.8 kg), last menstrual period 09/12/2017. General appearance - alert, well appearing, and in no distress Abdomen - soft, nontender, nondistended, no masses or organomegaly Pelvic - examination  Pelvic exam: VULVA: normal appearing vulva with no masses, tenderness or lesions, VAGINA: normal appearing vagina with normal color and discharge, no lesions, CERVIX: tiny UTERUS: uterus is enlarged 16 wks size, shape, firm consistency and nontender, ADNEXA: normal adnexa in size, nontender and no masses. Extremities - chronic sore on left thigh, peripheral pulses normal, no pedal edema, no clubbing or cyanosis  Labs: CBC Latest Ref Rng & Units 07/30/2017 07/18/2017 07/13/2017  WBC 4.0 - 10.5 K/uL 7.9 6.3 6.6  Hemoglobin 12.0 - 15.0 g/dL 12.8 12.0 12.2  Hematocrit 36.0 - 46.0 % 40.7 37.7 35.7  Platelets 150 - 400 K/uL 117(L) 96(L) 116(L)    CMP Latest Ref Rng & Units 07/30/2017 07/18/2017 07/13/2017  Glucose 65 - 99 mg/dL 93 111(H) 102(H)  BUN 6 - 20 mg/dL 7 6 8   Creatinine 0.44 - 1.00 mg/dL 0.84 0.76 0.98  Sodium 135 - 145 mmol/L 138 136 138  Potassium 3.5 - 5.1 mmol/L 3.8 3.6 3.6  Chloride 101 - 111 mmol/L 102 101 97  CO2 22 - 32 mmol/L 25 23 24   Calcium 8.9 - 10.3 mg/dL 9.7 9.1 10.1  Total Protein 6.5 - 8.1 g/dL - 8.3(H) 8.2  Total Bilirubin 0.3 - 1.2 mg/dL - 1.6(H) 1.4(H)  Alkaline Phos 38 - 126 U/L - 83 89  AST  15 - 41 U/L - 55(H) 47(H)  ALT 14 - 54 U/L - 45 42(H)    Imaging Studies:  ImagingResults  Dg Toe Great Right  Result Date: 09/12/2017 Clinical:  History of fracture of the right great toe X-rays were done of the right great toe, three views. There is a fracture of the distal phalanx of the great toe, lateral and dorsal with minimal displacement.  Bone quality is good. Impression:  Fracture of the right great toe distal phalanx. Electronically [place],  MD 7/17/20199:39 AM     Assessment:     Patient Active Problem List   Diagnosis Date Noted  . Anemia due to chronic blood loss 06/07/2017  . Symptomatic anemia 06/03/2017  . Abnormal uterine bleeding (AUB) 02/12/2017  . Acute blood loss anemia 11/27/2016  . Thickened endometrium 11/23/2016  . Uterine leiomyoma 11/23/2016  . Menorrhagia with regular cycle 11/23/2016  . Alcoholic cirrhosis of liver ([place]) 07/07/2016  . Esophageal dysphagia 07/07/2016  . Esophageal varices without bleeding ([place]) 07/07/2016  . IDA (iron deficiency anemia) 06/09/2016  . PTSD (post-traumatic stress disorder) 04/20/2016  . OCD (obsessive compulsive disorder) 04/20/2016  . Severe recurrent major depression without psychotic features ([place]) 04/19/2016  . GERD (gastroesophageal reflux disease) 03/09/2016  . Depression 02/07/2016  . Bilateral edema of lower extremity   . History of colonic polyps   . Alcoholic cirrhosis of liver with ascites ([place])   . Anasarca 02/16/2015  . Alcoholic hepatitis with ascites   . Ascites   . Bleeding gastrointestinal   . AKI (acute kidney injury) ([place]) 12/10/2014  . Volume overload 12/10/2014  . Alcoholic cirrhosis ([place])   . Elevated bilirubin   . Nausea with vomiting   . Diarrhea   . Absolute anemia 12/02/2014  . Jaundice 12/02/2014  . Essential hypertension 12/02/2014  . Alcohol use disorder, moderate, dependence ([place]) 12/02/2014  . Abdominal pain 12/02/2014    Plan: 1) Patient  will undergo surgical management with Abdominal Supracervical Hysterectomy and Bilateral Salpingectomy 2) Schedule surgery , probably for 10/02/2017.   By signing my name below, I, De Burrs, attest that this documentation has been prepared under the direction and in the presence of Jonnie Kind, MD. Electronically Signed: De Burrs, Medical Scribe. 09/20/17. 11:24 AM.  I personally performed the services described in this documentation, which was SCRIBED in my presence. The recorded information has been reviewed and considered accurate. It has been edited as necessary during review. Jonnie Kind, MD

## 2017-09-25 NOTE — Patient Instructions (Signed)
Whitney Bush  09/25/2017     @[email or web address] @   Your procedure is scheduled on  10/02/2017   Report to [facility] at  720   A.M.  Call this number if you have problems the morning of surgery:  [phone]   Remember:  Do not eat or drink after midnight.  You may drink clear liquids until  12 midnight 10/01/2017  .  Clear liquids allowed are:                    Water, Juice (non-citric and without pulp), Carbonated beverages, Clear Tea, Black Coffee only, Plain Jell-O only, Gatorade and Plain Popsicles only    Take these medicines the morning of surgery with A SIP OF WATER  Aldactone, ultram.    Do not wear jewelry, make-up or nail polish.  Do not wear lotions, powders, or perfumes, or deodorant.  Do not shave 48 hours prior to surgery.  Men may shave face and neck.  Do not bring valuables to the hospital.  [facility] is not responsible for any belongings or valuables.  Contacts, dentures or bridgework may not be worn into surgery.  Leave your suitcase in the car.  After surgery it may be brought to your room.  For patients admitted to the hospital, discharge time will be determined by your treatment team.  Patients discharged the day of surgery will not be allowed to drive home.   Name and phone number of your driver:   family Special instructions:  Follow the diet and prep instructions enclosed.  Please read over the following fact sheets that you were given. Anesthesia Post-op Instructions and Care and Recovery After Surgery      Supracervical Hysterectomy A supracervical hysterectomy is surgery to remove the top part of the uterus, but not the cervix. You will no longer have menstrual periods or be able to get pregnant after this surgery. The fallopian tubes and ovaries may also be removed (bilateral salpingo-oophorectomy) during this surgery. This surgery is usually performed using a minimally invasive technique called laparoscopy. This  technique allows the surgery to be done through small incisions. The minimally invasive technique provides benefits such as less pain, less risk of infection, and shorter recovery time. Tell a health care provider about:  Any allergies you have.  All medicines you are taking, including vitamins, herbs, eye drops, creams, and over-the-counter medicines.  Any problems you or family members have had with anesthetic medicines.  Any blood disorders you have.  Any surgeries you have had.  Any medical conditions you have. What are the risks? Generally, this is a safe procedure. However, as with any procedure, complications can occur. Possible complications include:  Bleeding.  Blood clots in the legs or lung.  Infection.  Injury to surrounding organs.  Problems related to anesthesia.  Conversion to an open abdominal surgery.  Additional surgery later to remove the cervix if you have problems with the cervix.  What happens before the procedure?  Ask your health care provider about changing or stopping your regular medicines.  Do not take aspirin or blood thinners (anticoagulants) for 1 week before the surgery, or as directed by your health care provider.  Do not eat or drink anything for 8 hours before the surgery, or as directed by your health care provider.  Quit smoking if you smoke.  Arrange for a ride home after surgery and for someone to help  you at home during recovery. What happens during the procedure?  You will be given an antibiotic medicine.  An IV tube will be placed in one of your veins. You will be given medicine to make you sleep (general anesthetic).  A gas (carbon dioxide) will be used to inflate your abdomen. This will allow your surgeon to look inside your abdomen, perform your surgery, and treat any other problems found if necessary.  Three or four small incisions will be made in your abdomen. One of these incisions will be made in the area of your belly  button (navel). A thin, flexible tube with a tiny camera and light on the end of it (laparoscope) will be inserted into the incision. The camera on the laparoscope sends a picture to a TV screen in the operating room. This gives your surgeon a good view inside the abdomen.  Other surgical instruments will be inserted through the other incisions.  The uterus will be cut into small pieces and removed through the small incisions.  Your incisions will be closed. What happens after the procedure?  You will be taken to a recovery area where your progress will be monitored until you are awake, stable, and taking fluids well. If there are no other problems, you will then be moved to a regular hospital room, or you will be allowed to go home.  You will likely have minimal discomfort after the surgery because the incisions are so small with the laparoscopic technique.  You will be given pain medicine while you are in the hospital and for when you go home.  If a bilateral salpingo-oophorectomy was performed before menopause, you will go through a sudden (abrupt) menopause. This can be helped with hormone medicines. This information is not intended to replace advice given to you by your health care provider. Make sure you discuss any questions you have with your health care provider. Document Released: 08/02/2007 Document Revised: 07/22/2015 Document Reviewed: 08/16/2012 Elsevier Interactive Patient Education  2018 [place] Hysterectomy, Care After Refer to this sheet in the next few weeks. These instructions provide you with information on caring for yourself after your procedure. Your health care provider may also give you more specific instructions. Your treatment has been planned according to current medical practices, but problems sometimes occur. Call your health care provider if you have any problems or questions after your procedure. What can I expect after the procedure? After  your procedure, it is typical to have some discomfort, tenderness, swelling, and bruising at the surgical sites. This normally lasts for about 2 weeks. Follow these instructions at home:  Get plenty of rest and sleep.  Only take over-the-counter or prescription medicines as directed by your health care provider.  Do not take aspirin. It can cause bleeding.  Do not drive until your health care provider approves.  Follow your health care provider's advice regarding exercise, lifting, and general activities.  Resume your usual diet as directed by your health care provider.  Do not douche, use tampons, or have sexual intercourse for at least 6 weeks or until your health care provider gives you permission.  Change your bandages (dressings) only as directed by your health care provider.  Monitor your temperature.  Take showers instead of baths for 2-3 weeks or as directed by your health care provider.  Drink enough fluids to keep your urine clear or pale yellow.  Do not drink alcohol until your health care provider gives you permission.  If you are  constipated, you may take a mild laxative if your health care provider approves. Bran foods may also help with constipation problems.  Try to have someone home with you for 1-2 weeks to help with activities.  Follow up with your health care provider as directed. Contact a health care provider if:  You have swelling, redness, or increasing pain in the incision area.  You have pus coming from an incision.  You notice a bad smell coming from the incision or dressing.  You have swelling, redness, or pain in the area around the IV site.  Your incision breaks open.  You feel dizzy or lightheaded.  You have pain or bleeding when you urinate.  You have persistent diarrhea.  You have persistent nausea and vomiting.  You have abnormal vaginal discharge.  You have a rash.  Your pain is not controlled with your prescribed  medicine. Get help right away if:  You have a fever.  You have severe abdominal pain.  You have chest pain.  You have shortness of breath.  You faint.  You have pain, swelling, or redness in your leg.  You have heavy vaginal bleeding with blood clots. This information is not intended to replace advice given to you by your health care provider. Make sure you discuss any questions you have with your health care provider. Document Released: 12/04/2012 Document Revised: 07/22/2015 Document Reviewed: 08/16/2012 Elsevier Interactive Patient Education  2018 [place], also called tubectomy, is the surgical removal of one of the fallopian tubes. The fallopian tubes are where eggs travel from the ovaries to the uterus. Removing one fallopian tube does not prevent you from becoming pregnant. It also does not cause problems with your menstrual periods. You may need a salpingectomy if you:  Have a fertilized egg that attaches to the fallopian tube (ectopic pregnancy), especially one that causes the tube to burst or tear (rupture).  Have an infected fallopian tube.  Have cancer of the fallopian tube or nearby organs.  Have had an ovary removed due to a cyst or tumor.  Have had your uterus removed.  There are three different methods that can be used for a salpingectomy:  Open. This method involves making one large incision in your abdomen.  Laparoscopic. This method involves using a thin, lighted tube with a tiny camera on the end (laparoscope) to help perform the procedure. The laparoscope will allow your surgeon to make several small incisions in the abdomen instead of a large incision.  Robot-assisted: This method involves using a computer to control surgical instruments that are attached to robotic arms.  Tell a health care provider about:  Any allergies you have.  All medicines you are taking, including vitamins, herbs, eye drops, creams, and  over-the-counter medicines.  Any problems you or family members have had with anesthetic medicines.  Any blood disorders you have.  Any surgeries you have had.  Any medical conditions you have.  Whether you are pregnant or may be pregnant. What are the risks? Generally, this is a safe procedure. However, problems may occur, including:  Infection.  Bleeding.  Allergic reactions to medicines.  Damage to other structures or organs.  Blood clots in the legs or lungs.  What happens before the procedure? Staying hydrated Follow instructions from your health care provider about hydration, which may include:  Up to 2 hours before the procedure - you may continue to drink clear liquids, such as water, clear fruit juice, black coffee, and plain tea.  Eating and drinking restrictions Follow instructions from your health care provider about eating and drinking, which may include:  8 hours before the procedure - stop eating heavy meals or foods such as meat, fried foods, or fatty foods.  6 hours before the procedure - stop eating light meals or foods, such as toast or cereal.  6 hours before the procedure - stop drinking milk or drinks that contain milk.  2 hours before the procedure - stop drinking clear liquids.  Medicines  Ask your health care provider about: ? Changing or stopping your regular medicines. This is especially important if you are taking diabetes medicines or blood thinners. ? Taking medicines such as aspirin and ibuprofen. These medicines can thin your blood. Do not take these medicines before your procedure if your health care provider instructs you not to.  You may be given antibiotic medicine to help prevent infection. General instructions  Do not smoke for at least 2 weeks before your procedure. If you need help quitting, ask your health care provider.  You may have an exam or tests, such as an electrocardiogram (ECG).  You may have a blood or urine  sample taken.  Ask your health care provider: ? Whether you should stop removing hair from your surgical area. ? How your surgical site will be marked or identified.  You may be asked to shower with a germ-killing soap.  Plan to have someone take you home from the hospital or clinic.  If you will be going home right after the procedure, plan to have someone with you for 24 hours. What happens during the procedure?  To reduce your risk of infection: ? Your health care team will wash or sanitize their hands. ? Hair may be removed from the surgical area. ? Your skin will be washed with soap.  An IV tube will be inserted into one of your veins.  You will be given a medicine to make you fall asleep (general anesthetic). You may also be given a medicine to help you relax (sedative).  A thin tube (catheter) may be inserted through your urethra and into your bladder to drain urine during your procedure.  Depending on the type of procedure you are having, one incision or several small incisions will be made in your abdomen.  Your fallopian tube will be cut and removed from where it attaches to your uterus.  Your blood vessels will be clamped and tied to prevent excess bleeding.  The incision(s) in your abdomen will be closed with stitches (sutures), staples, or skin glue.  A bandage (dressing) may be placed over your incision(s). The procedure may vary among health care providers and hospitals. What happens after the procedure?  Your blood pressure, heart rate, breathing rate, and blood oxygen level will be monitored until the medicines you were given have worn off.  You may continue to receive fluids and medicines through an IV tube.  You may continue to have a catheter draining your urine.  You may have to wear compression stockings. These stockings help to prevent blood clots and reduce swelling in your legs.  You will be given pain medicine as needed.  Do not drive for 24  hours if you received a sedative. Summary  Salpingectomy is a surgical procedure to remove one of the fallopian tubes.  The procedure may be done with an open incision, with a laparoscope, or with computer-controlled instruments.  Depending on the type of procedure you are having, one incision or several  small incisions will be made in your abdomen.  Your blood pressure, heart rate, breathing rate, and blood oxygen level will be monitored until the medicines you were given have worn off.  Plan to have someone take you home from the hospital or clinic. This information is not intended to replace advice given to you by your health care provider. Make sure you discuss any questions you have with your health care provider. Document Released: 07/02/2008 Document Revised: 10/01/2015 Document Reviewed: 08/07/2012 Elsevier Interactive Patient Education  2018 [place] Anesthesia, Adult General anesthesia is the use of medicines to make a person "go to sleep" (be unconscious) for a medical procedure. General anesthesia is often recommended when a procedure:  Is long.  Requires you to be still or in an unusual position.  Is major and can cause you to lose blood.  Is impossible to do without general anesthesia.  The medicines used for general anesthesia are called general anesthetics. In addition to making you sleep, the medicines:  Prevent pain.  Control your blood pressure.  Relax your muscles.  Tell a health care provider about:  Any allergies you have.  All medicines you are taking, including vitamins, herbs, eye drops, creams, and over-the-counter medicines.  Any problems you or family members have had with anesthetic medicines.  Types of anesthetics you have had in the past.  Any bleeding disorders you have.  Any surgeries you have had.  Any medical conditions you have.  Any history of heart or lung conditions, such as heart failure, sleep apnea, or chronic  obstructive pulmonary disease (COPD).  Whether you are pregnant or may be pregnant.  Whether you use tobacco, alcohol, marijuana, or street drugs.  Any history of Armed forces logistics/support/administrative officer.  Any history of depression or anxiety. What are the risks? Generally, this is a safe procedure. However, problems may occur, including:  Allergic reaction to anesthetics.  Lung and heart problems.  Inhaling food or liquids from your stomach into your lungs (aspiration).  Injury to nerves.  Waking up during your procedure and being unable to move (rare).  Extreme agitation or a state of mental confusion (delirium) when you wake up from the anesthetic.  Air in the bloodstream, which can lead to stroke.  These problems are more likely to develop if you are having a major surgery or if you have an advanced medical condition. You can prevent some of these complications by answering all of your health care provider's questions thoroughly and by following all pre-procedure instructions. General anesthesia can cause side effects, including:  Nausea or vomiting  A sore throat from the breathing tube.  Feeling cold or shivery.  Feeling tired, washed out, or achy.  Sleepiness or drowsiness.  Confusion or agitation.  What happens before the procedure? Staying hydrated Follow instructions from your health care provider about hydration, which may include:  Up to 2 hours before the procedure - you may continue to drink clear liquids, such as water, clear fruit juice, black coffee, and plain tea.  Eating and drinking restrictions Follow instructions from your health care provider about eating and drinking, which may include:  8 hours before the procedure - stop eating heavy meals or foods such as meat, fried foods, or fatty foods.  6 hours before the procedure - stop eating light meals or foods, such as toast or cereal.  6 hours before the procedure - stop drinking milk or drinks that contain  milk.  2 hours before the procedure - stop  drinking clear liquids.  Medicines  Ask your health care provider about: ? Changing or stopping your regular medicines. This is especially important if you are taking diabetes medicines or blood thinners. ? Taking medicines such as aspirin and ibuprofen. These medicines can thin your blood. Do not take these medicines before your procedure if your health care provider instructs you not to. ? Taking new dietary supplements or medicines. Do not take these during the week before your procedure unless your health care provider approves them.  If you are told to take a medicine or to continue taking a medicine on the day of the procedure, take the medicine with sips of water. General instructions   Ask if you will be going home the same day, the following day, or after a longer hospital stay. ? Plan to have someone take you home. ? Plan to have someone stay with you for the first 24 hours after you leave the hospital or clinic.  For 3-6 weeks before the procedure, try not to use any tobacco products, such as cigarettes, chewing tobacco, and e-cigarettes.  You may brush your teeth on the morning of the procedure, but make sure to spit out the toothpaste. What happens during the procedure?  You will be given anesthetics through a mask and through an IV tube in one of your veins.  You may receive medicine to help you relax (sedative).  As soon as you are asleep, a breathing tube may be used to help you breathe.  An anesthesia specialist will stay with you throughout the procedure. He or she will help keep you comfortable and safe by continuing to give you medicines and adjusting the amount of medicine that you get. He or she will also watch your blood pressure, pulse, and oxygen levels to make sure that the anesthetics do not cause any problems.  If a breathing tube was used to help you breathe, it will be removed before you wake up. The procedure  may vary among health care providers and hospitals. What happens after the procedure?  You will wake up, often slowly, after the procedure is complete, usually in a recovery area.  Your blood pressure, heart rate, breathing rate, and blood oxygen level will be monitored until the medicines you were given have worn off.  You may be given medicine to help you calm down if you feel anxious or agitated.  If you will be going home the same day, your health care provider may check to make sure you can stand, drink, and urinate.  Your health care providers will treat your pain and side effects before you go home.  Do not drive for 24 hours if you received a sedative.  You may: ? Feel nauseous and vomit. ? Have a sore throat. ? Have mental slowness. ? Feel cold or shivery. ? Feel sleepy. ? Feel tired. ? Feel sore or achy, even in parts of your body where you did not have surgery. This information is not intended to replace advice given to you by your health care provider. Make sure you discuss any questions you have with your health care provider. Document Released: 05/23/2007 Document Revised: 07/27/2015 Document Reviewed: 01/28/2015 Elsevier Interactive Patient Education  2018 [place] Anesthesia, Adult, Care After These instructions provide you with information about caring for yourself after your procedure. Your health care provider may also give you more specific instructions. Your treatment has been planned according to current medical practices, but problems sometimes occur. Call your  health care provider if you have any problems or questions after your procedure. What can I expect after the procedure? After the procedure, it is common to have:  Vomiting.  A sore throat.  Mental slowness.  It is common to feel:  Nauseous.  Cold or shivery.  Sleepy.  Tired.  Sore or achy, even in parts of your body where you did not have surgery.  Follow these instructions  at home: For at least 24 hours after the procedure:  Do not: ? Participate in activities where you could fall or become injured. ? Drive. ? Use heavy machinery. ? Drink alcohol. ? Take sleeping pills or medicines that cause drowsiness. ? Make important decisions or sign legal documents. ? Take care of children on your own.  Rest. Eating and drinking  If you vomit, drink water, juice, or soup when you can drink without vomiting.  Drink enough fluid to keep your urine clear or pale yellow.  Make sure you have little or no nausea before eating solid foods.  Follow the diet recommended by your health care provider. General instructions  Have a responsible adult stay with you until you are awake and alert.  Return to your normal activities as told by your health care provider. Ask your health care provider what activities are safe for you.  Take over-the-counter and prescription medicines only as told by your health care provider.  If you smoke, do not smoke without supervision.  Keep all follow-up visits as told by your health care provider. This is important. Contact a health care provider if:  You continue to have nausea or vomiting at home, and medicines are not helpful.  You cannot drink fluids or start eating again.  You cannot urinate after 8-12 hours.  You develop a skin rash.  You have fever.  You have increasing redness at the site of your procedure. Get help right away if:  You have difficulty breathing.  You have chest pain.  You have unexpected bleeding.  You feel that you are having a life-threatening or urgent problem. This information is not intended to replace advice given to you by your health care provider. Make sure you discuss any questions you have with your health care provider. Document Released: 05/22/2000 Document Revised: 07/19/2015 Document Reviewed: 01/28/2015 Elsevier Interactive Patient Education  [employer].

## 2017-09-26 MED ORDER — CEFAZOLIN SODIUM-DEXTROSE 2-4 GM/100ML-% IV SOLN: 2.0000 g | INTRAVENOUS | Status: AC

## 2017-09-27 ENCOUNTER — Encounter (HOSPITAL_COMMUNITY): Payer: Self-pay

## 2017-09-27 ENCOUNTER — Encounter (HOSPITAL_COMMUNITY)
Admission: RE | Admit: 2017-09-27 | Discharge: 2017-09-27 | Disposition: A | Discharge status: Home / Self Care | Payer: Medicaid Other | Source: Ambulatory Visit | Attending: Obstetrics and Gynecology | Admitting: Obstetrics and Gynecology

## 2017-09-27 ENCOUNTER — Other Ambulatory Visit: Payer: Self-pay

## 2017-09-27 DIAGNOSIS — Z0183 Encounter for blood typing: Secondary | ICD-10-CM | POA: Diagnosis not present

## 2017-09-27 DIAGNOSIS — D259 Leiomyoma of uterus, unspecified: Secondary | ICD-10-CM | POA: Insufficient documentation

## 2017-09-27 DIAGNOSIS — Z01812 Encounter for preprocedural laboratory examination: Secondary | ICD-10-CM | POA: Insufficient documentation

## 2017-09-27 DIAGNOSIS — D649 Anemia, unspecified: Secondary | ICD-10-CM | POA: Diagnosis not present

## 2017-09-27 HISTORY — DX: Chronic obstructive pulmonary disease, unspecified: J44.9

## 2017-09-27 LAB — RAPID URINE DRUG SCREEN, HOSP PERFORMED
AMPHETAMINES: NOT DETECTED
BENZODIAZEPINES: NOT DETECTED
Barbiturates: NOT DETECTED
Cocaine: NOT DETECTED
Opiates: NOT DETECTED
Tetrahydrocannabinol: NOT DETECTED

## 2017-09-27 LAB — COMPREHENSIVE METABOLIC PANEL
ALT: 27 U/L (ref 0–44)
ANION GAP: 8 (ref 5–15)
AST: 36 U/L (ref 15–41)
Albumin: 3.4 g/dL — ABNORMAL LOW (ref 3.5–5.0)
Alkaline Phosphatase: 91 U/L (ref 38–126)
BUN: 13 mg/dL (ref 6–20)
CALCIUM: 9.1 mg/dL (ref 8.9–10.3)
CHLORIDE: 107 mmol/L (ref 98–111)
CO2: 24 mmol/L (ref 22–32)
CREATININE: 0.93 mg/dL (ref 0.44–1.00)
GFR calc non Af Amer: >60 mL/min (ref >60)
Glucose, Bld: 92 mg/dL (ref 70–99)
Potassium: 3.8 mmol/L (ref 3.5–5.1)
SODIUM: 139 mmol/L (ref 135–145)
Total Bilirubin: 0.8 mg/dL (ref 0.3–1.2)
Total Protein: 8.3 g/dL — ABNORMAL HIGH (ref 6.5–8.1)

## 2017-09-27 LAB — CBC
HCT: 34.9 % — ABNORMAL LOW (ref 36.0–46.0)
Hemoglobin: 10.9 g/dL — ABNORMAL LOW (ref 12.0–15.0)
MCH: 26.8 pg (ref 26.0–34.0)
MCHC: 31.2 g/dL (ref 30.0–36.0)
MCV: 86 fL (ref 78.0–100.0)
PLATELETS: 158 10*3/uL (ref 150–400)
RBC: 4.06 MIL/uL (ref 3.87–5.11)
RDW: 17.7 % — AB (ref 11.5–15.5)
WBC: 10.5 10*3/uL (ref 4.0–10.5)

## 2017-09-27 LAB — URINALYSIS, ROUTINE W REFLEX MICROSCOPIC
Bacteria, UA: NONE SEEN
Bilirubin Urine: NEGATIVE
Glucose, UA: NEGATIVE mg/dL
Ketones, ur: NEGATIVE mg/dL
Leukocytes, UA: NEGATIVE
Nitrite: NEGATIVE
PROTEIN: 100 mg/dL — AB
SPECIFIC GRAVITY, URINE: 1.02 (ref 1.005–1.030)
pH: 7 (ref 5.0–8.0)

## 2017-09-27 LAB — HCG, SERUM, QUALITATIVE: PREG SERUM: NEGATIVE

## 2017-09-27 NOTE — Progress Notes (Signed)
   09/27/17 1454  OBSTRUCTIVE SLEEP APNEA  Have you ever been diagnosed with sleep apnea through a sleep study? No  Do you snore loudly (loud enough to be heard through closed doors)?  1  Do you often feel tired, fatigued, or sleepy during the daytime (such as falling asleep during driving or talking to someone)? 1  Has anyone observed you stop breathing during your sleep? 1  Do you have, or are you being treated for high blood pressure? 1  BMI more than 35 kg/m2? 1  Age > 50 (1-yes) 0  Neck circumference greater than:Female 16 inches or larger, Female 17inches or larger? 0  Female Gender (Yes=1) 0  Obstructive Sleep Apnea Score 5  Score 5 or greater  Results sent to PCP

## 2017-10-01 NOTE — H&P (Signed)
[facility] Visit  @DATE @            Patient name: Whitney Bush        MRN [id number]  Date of birth: June 12, 1970  Preoperative History and Physical  Whitney Bush is a 47 y.o. G2P1011 here for surgical management of Abdominal Supracervical Hysterectomy and Bilateral Salpingectomy. For symptomatic uterine fibroids and heavy bleeding. Significant preoperative concerns.   PT has had an etOH problem, currently she denies etoh use. She had a C section but no other surgeries in the area.  Proposed surgery: Abdominal Supracervical Hysterectomy and Bilateral Salpingectomy      Past Medical History:  Diagnosis Date  . Alcohol abuse   . 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 Anemia   . Blood transfusion without reported diagnosis   . Boil 06/04/2015  . Chronic kidney disease   . Clotting disorder ([place])   . Depression   . GERD (gastroesophageal reflux disease)   . GI (gastrointestinal bleed) 07/28/2015  . Heart disease   . Heart murmur   . Hyperlipidemia   . Hypertension   . Hypothyroidism   . Migraines   . OCD (obsessive compulsive disorder)   . Peripheral edema   . PTSD (post-traumatic stress disorder)   . Tubular adenoma         Past Surgical History:  Procedure Laterality Date  . ABCESS DRAINAGE     x5; neck, arm, chest, back  . BIOPSY  02/24/2015   Procedure: BIOPSY;  Surgeon: Danie Binder, MD;  [facility];  Service: Endoscopy;;  gastric bx's  . BIOPSY  12/25/2016   Procedure: BIOPSY;  Surgeon: Daneil Dolin, MD;  [facility];  Service: Endoscopy;;  gastric  . CESAREAN SECTION    . COLONOSCOPY WITH PROPOFOL N/A 06/28/2015   Dr.Rourk- inadequate prep- One 6 mm polyp at the splenic flexure bx= tubular adenoma  . ESOPHAGEAL BANDING N/A 12/25/2016   Procedure: ESOPHAGEAL BANDING with Propofol;  Surgeon: Daneil Dolin, MD;  [facility];  Service:  Endoscopy;  Laterality: N/A;  possible banding of varices  . ESOPHAGOGASTRODUODENOSCOPY (EGD) WITH PROPOFOL N/A 02/24/2015   SLF: Grade II esophageal varices Moderate portal hypertensive gastropathy Mild erosive gastritis anemia likely due to many factors: gastritis, gastropathy, coagulopathy, chronic disease  . ESOPHAGOGASTRODUODENOSCOPY (EGD) WITH PROPOFOL N/A 12/25/2016   Procedure: ESOPHAGOGASTRODUODENOSCOPY (EGD) WITH PROPOFOL;  Surgeon: Daneil Dolin, MD;  [facility];  Service: Endoscopy;  Laterality: N/A;  8:45am  . MALONEY DILATION N/A 12/25/2016   Procedure: Venia Minks DILATION;  Surgeon: Daneil Dolin, MD;  [facility];  Service: Endoscopy;  Laterality: N/A;  . POLYPECTOMY  06/28/2015   Procedure: POLYPECTOMY;  Surgeon: Daneil Dolin, MD;  [facility];  Service: Endoscopy;;  at splenic flexure                   OB History  Gravida Para Term Preterm AB Living  2 1 1   1 1   SAB TAB Ectopic Multiple Live Births             1       # Outcome Date GA Lbr Len/2nd Weight Sex Delivery Anes PTL Lv  2 AB           1 Term     F CS-LTranv   LIV  Patient  denies any other pertinent gynecologic issues.         Current Outpatient Medications on File Prior to Visit  Medication Sig Dispense Refill  . B Complex-C (SUPER B COMPLEX PO) Take 1 tablet by mouth daily.     Marland Kitchen doxycycline (VIBRAMYCIN) 100 MG capsule Take 1 capsule (100 mg total) by mouth 2 (two) times daily. (Patient not taking: Reported on 09/20/2017) 14 capsule 1  . traMADol (ULTRAM) 50 MG tablet Take 1 tablet (50 mg total) by mouth every 6 (six) hours as needed. (Patient not taking: Reported on 09/20/2017) 20 tablet 0   No current facility-administered medications on file prior to visit.    No Known Allergies  Social History:   reports that she quit smoking about 6 years ago. Her smoking use included cigarettes. She has a 5.00 pack-year smoking history. She has quit  using smokeless tobacco. Her smokeless tobacco use included snuff. She reports that she drinks alcohol. She reports that she does not use drugs.       Family History  Problem Relation Age of Onset  . Diabetes Father   . Hyperlipidemia Father   . Alcohol abuse Father   . Hypertension Father   . Cancer Other   . Cervical cancer Maternal Grandmother   . Lung cancer Maternal Grandfather   . Alcohol abuse Other        multiple family members  . Diabetes Sister   . Hypertension Sister   . Hypertension Brother   . Colon cancer Neg Hx   . Liver disease Neg Hx     Review of Systems: Noncontributory  PHYSICAL EXAM: Blood pressure 130/85, pulse 95, height 5\' 3"  (1.6 m), weight 255 lb 6.4 oz (115.8 kg), last menstrual period 09/12/2017. General appearance - alert, well appearing, and in no distress Abdomen - soft, nontender, nondistended, no masses or organomegaly Pelvic - examination  Pelvic exam: VULVA: normal appearing vulva with no masses, tenderness or lesions, VAGINA: normal appearing vagina with normal color and discharge, no lesions, CERVIX: tiny UTERUS: uterus is normal size, shape, consistency and nontender, ADNEXA: normal adnexa in size, nontender and no masses. Extremities - chronic sore on left thigh, peripheral pulses normal, no pedal edema, no clubbing or cyanosis  Labs: CBC Latest Ref Rng & Units 07/30/2017 07/18/2017 07/13/2017  WBC 4.0 - 10.5 K/uL 7.9 6.3 6.6  Hemoglobin 12.0 - 15.0 g/dL 12.8 12.0 12.2  Hematocrit 36.0 - 46.0 % 40.7 37.7 35.7  Platelets 150 - 400 K/uL 117(L) 96(L) 116(L)    CMP Latest Ref Rng & Units 07/30/2017 07/18/2017 07/13/2017  Glucose 65 - 99 mg/dL 93 111(H) 102(H)  BUN 6 - 20 mg/dL 7 6 8   Creatinine 0.44 - 1.00 mg/dL 0.84 0.76 0.98  Sodium 135 - 145 mmol/L 138 136 138  Potassium 3.5 - 5.1 mmol/L 3.8 3.6 3.6  Chloride 101 - 111 mmol/L 102 101 97  CO2 22 - 32 mmol/L 25 23 24   Calcium 8.9 - 10.3 mg/dL 9.7 9.1 10.1  Total  Protein 6.5 - 8.1 g/dL - 8.3(H) 8.2  Total Bilirubin 0.3 - 1.2 mg/dL - 1.6(H) 1.4(H)  Alkaline Phos 38 - 126 U/L - 83 89  AST 15 - 41 U/L - 55(H) 47(H)  ALT 14 - 54 U/L - 45 42(H)    CBC    Component Value Date/Time   WBC 10.5 09/27/2017 1447   RBC 4.06 09/27/2017 1447   HGB 10.9 (L) 09/27/2017 1447   HGB 12.2 07/13/2017 1040  HCT 34.9 (L) 09/27/2017 1447   HCT 35.7 07/13/2017 1040   PLT 158 09/27/2017 1447   PLT 116 (L) 07/13/2017 1040   MCV 86.0 09/27/2017 1447   MCV 85 07/13/2017 1040   MCH 26.8 09/27/2017 1447   MCHC 31.2 09/27/2017 1447   RDW 17.7 (H) 09/27/2017 1447   RDW 21.2 (H) 07/13/2017 1040   LYMPHSABS 1.5 07/30/2017 1252   MONOABS 0.6 07/30/2017 1252   EOSABS 0.1 07/30/2017 1252   BASOSABS 0.0 07/30/2017 1252   CMP     Component Value Date/Time   NA 139 09/27/2017 1447   NA 138 07/13/2017 1040   K 3.8 09/27/2017 1447   CL 107 09/27/2017 1447   CO2 24 09/27/2017 1447   GLUCOSE 92 09/27/2017 1447   BUN 13 09/27/2017 1447   BUN 8 07/13/2017 1040   CREATININE 0.93 09/27/2017 1447   CREATININE 1.00 05/30/2017 0901   CALCIUM 9.1 09/27/2017 1447   PROT 8.3 (H) 09/27/2017 1447   PROT 8.2 07/13/2017 1040   ALBUMIN 3.4 (L) 09/27/2017 1447   ALBUMIN 3.9 07/13/2017 1040   AST 36 09/27/2017 1447   ALT 27 09/27/2017 1447   ALKPHOS 91 09/27/2017 1447   BILITOT 0.8 09/27/2017 1447   BILITOT 1.4 (H) 07/13/2017 1040   GFRNONAA >60 09/27/2017 1447   GFRAA >60 09/27/2017 1447     Imaging Studies:  ImagingResults  Dg Toe Great Right  Result Date: 09/12/2017 Clinical:  History of fracture of the right great toe X-rays were done of the right great toe, three views. There is a fracture of the distal phalanx of the great toe, lateral and dorsal with minimal displacement.  Bone quality is good. Impression:  Fracture of the right great toe distal phalanx. Electronically Signed Sanjuana Kava, MD 7/17/20199:39 AM     Assessment:     Patient Active Problem  List   Diagnosis Date Noted  . Anemia due to chronic blood loss 06/07/2017  . Symptomatic anemia 06/03/2017  . Abnormal uterine bleeding (AUB) 02/12/2017  . Acute blood loss anemia 11/27/2016  . Thickened endometrium 11/23/2016  . Uterine leiomyoma 11/23/2016  . Menorrhagia with regular cycle 11/23/2016  . Alcoholic cirrhosis of liver ([place]) 07/07/2016  . Esophageal dysphagia 07/07/2016  . Esophageal varices without bleeding ([place]) 07/07/2016  . IDA (iron deficiency anemia) 06/09/2016  . PTSD (post-traumatic stress disorder) 04/20/2016  . OCD (obsessive compulsive disorder) 04/20/2016  . Severe recurrent major depression without psychotic features ([place]) 04/19/2016  . GERD (gastroesophageal reflux disease) 03/09/2016  . Depression 02/07/2016  . Bilateral edema of lower extremity   . History of colonic polyps   . Alcoholic cirrhosis of liver with ascites ([place])   . Anasarca 02/16/2015  . Alcoholic hepatitis with ascites   . Ascites   . Bleeding gastrointestinal   . AKI (acute kidney injury) ([place]) 12/10/2014  . Volume overload 12/10/2014  . Alcoholic cirrhosis ([place])   . Elevated bilirubin   . Nausea with vomiting   . Diarrhea   . Absolute anemia 12/02/2014  . Jaundice 12/02/2014  . Essential hypertension 12/02/2014  . Alcohol use disorder, moderate, dependence ([place]) 12/02/2014  . Abdominal pain 12/02/2014    Plan: 1) Patient will undergo surgical management with Abdominal Supracervical Hysterectomy and Bilateral Salpingectomy 2) Schedule surgery , probably for 10/02/2017.   By signing my name below, I, De Burrs, attest that this documentation has been prepared under the direction and in the presence of Jonnie Kind, MD. Electronically Signed: De Burrs,  Medical Scribe. 09/20/17. 11:24 AM.  I personally performed the services described in this documentation, which was SCRIBED in my presence. The recorded information has been reviewed and considered  accurate. It has been edited as necessary during review. Jonnie Kind, MD

## 2017-10-02 ENCOUNTER — Inpatient Hospital Stay (HOSPITAL_COMMUNITY)
Admission: RE | Admit: 2017-10-02 | Discharge: 2017-10-04 | DRG: 742 | Disposition: A | Discharge status: Home / Self Care | Payer: Medicaid Other | Attending: Obstetrics and Gynecology | Admitting: Obstetrics and Gynecology

## 2017-10-02 ENCOUNTER — Encounter (HOSPITAL_COMMUNITY)
Admission: RE | Disposition: A | Discharge status: Home / Self Care | Payer: Self-pay | Source: Ambulatory Visit | Attending: Obstetrics and Gynecology

## 2017-10-02 ENCOUNTER — Encounter (HOSPITAL_COMMUNITY): Payer: Self-pay

## 2017-10-02 ENCOUNTER — Inpatient Hospital Stay (HOSPITAL_COMMUNITY): Payer: Medicaid Other | Admitting: Anesthesiology

## 2017-10-02 ENCOUNTER — Other Ambulatory Visit: Payer: Self-pay

## 2017-10-02 DIAGNOSIS — K7010 Alcoholic hepatitis without ascites: Secondary | ICD-10-CM | POA: Diagnosis present

## 2017-10-02 DIAGNOSIS — N189 Chronic kidney disease, unspecified: Secondary | ICD-10-CM | POA: Diagnosis present

## 2017-10-02 DIAGNOSIS — Z87891 Personal history of nicotine dependence: Secondary | ICD-10-CM

## 2017-10-02 DIAGNOSIS — D251 Intramural leiomyoma of uterus: Secondary | ICD-10-CM | POA: Diagnosis not present

## 2017-10-02 DIAGNOSIS — N920 Excessive and frequent menstruation with regular cycle: Secondary | ICD-10-CM | POA: Diagnosis present

## 2017-10-02 DIAGNOSIS — D259 Leiomyoma of uterus, unspecified: Principal | ICD-10-CM | POA: Diagnosis present

## 2017-10-02 DIAGNOSIS — E039 Hypothyroidism, unspecified: Secondary | ICD-10-CM | POA: Diagnosis present

## 2017-10-02 DIAGNOSIS — F429 Obsessive-compulsive disorder, unspecified: Secondary | ICD-10-CM | POA: Diagnosis present

## 2017-10-02 DIAGNOSIS — K219 Gastro-esophageal reflux disease without esophagitis: Secondary | ICD-10-CM | POA: Diagnosis present

## 2017-10-02 DIAGNOSIS — I129 Hypertensive chronic kidney disease with stage 1 through stage 4 chronic kidney disease, or unspecified chronic kidney disease: Secondary | ICD-10-CM | POA: Diagnosis present

## 2017-10-02 DIAGNOSIS — D649 Anemia, unspecified: Secondary | ICD-10-CM

## 2017-10-02 DIAGNOSIS — F4310 Post-traumatic stress disorder, unspecified: Secondary | ICD-10-CM | POA: Diagnosis present

## 2017-10-02 DIAGNOSIS — E785 Hyperlipidemia, unspecified: Secondary | ICD-10-CM | POA: Diagnosis present

## 2017-10-02 DIAGNOSIS — F1020 Alcohol dependence, uncomplicated: Secondary | ICD-10-CM | POA: Diagnosis present

## 2017-10-02 DIAGNOSIS — Z6841 Body Mass Index (BMI) 40.0 and over, adult: Secondary | ICD-10-CM

## 2017-10-02 DIAGNOSIS — Z90711 Acquired absence of uterus with remaining cervical stump: Secondary | ICD-10-CM | POA: Diagnosis present

## 2017-10-02 DIAGNOSIS — K7030 Alcoholic cirrhosis of liver without ascites: Secondary | ICD-10-CM | POA: Diagnosis present

## 2017-10-02 HISTORY — PX: SUPRACERVICAL ABDOMINAL HYSTERECTOMY: SHX5393

## 2017-10-02 LAB — PREPARE RBC (CROSSMATCH)

## 2017-10-02 SURGERY — HYSTERECTOMY, SUPRACERVICAL, ABDOMINAL
Anesthesia: General | Site: Abdomen

## 2017-10-02 MED ORDER — HYDROMORPHONE HCL 1 MG/ML IJ SOLN
INTRAMUSCULAR | Status: AC
  Filled 2017-10-02: qty 0.5

## 2017-10-02 MED ORDER — FENTANYL CITRATE (PF) 100 MCG/2ML IJ SOLN
INTRAMUSCULAR | Status: AC
  Filled 2017-10-02: qty 2

## 2017-10-02 MED ORDER — NALOXONE HCL 0.4 MG/ML IJ SOLN: 0.4000 mg | INTRAMUSCULAR | Status: DC | PRN

## 2017-10-02 MED ORDER — SODIUM CHLORIDE 0.9% FLUSH: 9.0000 mL | INTRAVENOUS | Status: DC | PRN

## 2017-10-02 MED ORDER — IBUPROFEN 600 MG PO TABS: 600.0000 mg | ORAL_TABLET | Freq: Four times a day (QID) | ORAL | Status: DC | PRN

## 2017-10-02 MED ORDER — LABETALOL HCL 5 MG/ML IV SOLN
INTRAVENOUS | Status: AC
  Filled 2017-10-02: qty 4

## 2017-10-02 MED ORDER — BUPIVACAINE HCL (PF) 0.5 % IJ SOLN
INTRAMUSCULAR | Status: AC
  Filled 2017-10-02: qty 30

## 2017-10-02 MED ORDER — HYDROMORPHONE 1 MG/ML IV SOLN
INTRAVENOUS | Status: DC
  Administered 2017-10-02: 15:00:00 via INTRAVENOUS

## 2017-10-02 MED ORDER — DIPHENHYDRAMINE HCL 50 MG/ML IJ SOLN: 12.5000 mg | Freq: Four times a day (QID) | INTRAMUSCULAR | Status: DC | PRN

## 2017-10-02 MED ORDER — PANTOPRAZOLE SODIUM 40 MG PO TBEC
40.0000 mg | DELAYED_RELEASE_TABLET | Freq: Every day | ORAL | Status: DC
  Administered 2017-10-03 – 2017-10-04 (×2): 40 mg via ORAL
  Filled 2017-10-02 (×2): qty 1

## 2017-10-02 MED ORDER — SUGAMMADEX SODIUM 200 MG/2ML IV SOLN
INTRAVENOUS | Status: DC | PRN
  Administered 2017-10-02: 500 mg via INTRAVENOUS

## 2017-10-02 MED ORDER — KETOROLAC TROMETHAMINE 30 MG/ML IJ SOLN
30.0000 mg | Freq: Once | INTRAMUSCULAR | Status: AC
  Administered 2017-10-02: 30 mg via INTRAVENOUS
  Filled 2017-10-02: qty 1

## 2017-10-02 MED ORDER — MIDAZOLAM HCL 2 MG/2ML IJ SOLN
INTRAMUSCULAR | Status: AC
  Filled 2017-10-02: qty 2

## 2017-10-02 MED ORDER — ROCURONIUM BROMIDE 50 MG/5ML IV SOLN
INTRAVENOUS | Status: AC
  Filled 2017-10-02: qty 1

## 2017-10-02 MED ORDER — SODIUM CHLORIDE 0.9 % IV SOLN
INTRAVENOUS | Status: AC
  Administered 2017-10-02 – 2017-10-03 (×5): via INTRAVENOUS

## 2017-10-02 MED ORDER — LIDOCAINE 2% (20 MG/ML) 5 ML SYRINGE
INTRAMUSCULAR | Status: DC | PRN
  Administered 2017-10-02: 50 mg via INTRAVENOUS

## 2017-10-02 MED ORDER — HYDROMORPHONE HCL 1 MG/ML IJ SOLN
0.2500 mg | INTRAMUSCULAR | Status: DC | PRN
  Administered 2017-10-02 (×6): 0.5 mg via INTRAVENOUS

## 2017-10-02 MED ORDER — KETOROLAC TROMETHAMINE 30 MG/ML IJ SOLN: 30.0000 mg | Freq: Four times a day (QID) | INTRAMUSCULAR | Status: DC

## 2017-10-02 MED ORDER — FENTANYL CITRATE (PF) 250 MCG/5ML IJ SOLN
INTRAMUSCULAR | Status: DC | PRN
  Administered 2017-10-02: 100 ug via INTRAVENOUS
  Administered 2017-10-02 (×8): 50 ug via INTRAVENOUS

## 2017-10-02 MED ORDER — DIPHENHYDRAMINE HCL 12.5 MG/5ML PO ELIX: 12.5000 mg | ORAL_SOLUTION | Freq: Four times a day (QID) | ORAL | Status: DC | PRN

## 2017-10-02 MED ORDER — FENTANYL CITRATE (PF) 250 MCG/5ML IJ SOLN
INTRAMUSCULAR | Status: AC
  Filled 2017-10-02: qty 5

## 2017-10-02 MED ORDER — MIDAZOLAM HCL 5 MG/5ML IJ SOLN
INTRAMUSCULAR | Status: DC | PRN
  Administered 2017-10-02: 2 mg via INTRAVENOUS

## 2017-10-02 MED ORDER — LABETALOL HCL 5 MG/ML IV SOLN
INTRAVENOUS | Status: DC | PRN
  Administered 2017-10-02 (×4): 5 mg via INTRAVENOUS

## 2017-10-02 MED ORDER — 0.9 % SODIUM CHLORIDE (POUR BTL) OPTIME
TOPICAL | Status: DC | PRN
  Administered 2017-10-02: 1000 mL

## 2017-10-02 MED ORDER — HYDROMORPHONE 1 MG/ML IV SOLN
INTRAVENOUS | Status: AC
  Filled 2017-10-02: qty 25

## 2017-10-02 MED ORDER — ONDANSETRON HCL 4 MG/2ML IJ SOLN: 4.0000 mg | Freq: Four times a day (QID) | INTRAMUSCULAR | Status: DC | PRN

## 2017-10-02 MED ORDER — ROCURONIUM BROMIDE 10 MG/ML (PF) SYRINGE
PREFILLED_SYRINGE | INTRAVENOUS | Status: DC | PRN
  Administered 2017-10-02: 60 mg via INTRAVENOUS
  Administered 2017-10-02: 10 mg via INTRAVENOUS
  Administered 2017-10-02 (×2): 20 mg via INTRAVENOUS

## 2017-10-02 MED ORDER — LACTATED RINGERS IV SOLN
INTRAVENOUS | Status: DC
  Administered 2017-10-02 (×3): via INTRAVENOUS

## 2017-10-02 MED ORDER — HYDROCODONE-ACETAMINOPHEN 7.5-325 MG PO TABS: 1.0000 | ORAL_TABLET | Freq: Once | ORAL | Status: DC | PRN

## 2017-10-02 MED ORDER — PROMETHAZINE HCL 25 MG/ML IJ SOLN
INTRAMUSCULAR | Status: AC
  Filled 2017-10-02: qty 1

## 2017-10-02 MED ORDER — ONDANSETRON HCL 4 MG PO TABS
4.0000 mg | ORAL_TABLET | Freq: Four times a day (QID) | ORAL | Status: DC | PRN
Start: 2017-10-02 — End: 2017-10-04

## 2017-10-02 MED ORDER — DEXAMETHASONE SODIUM PHOSPHATE 10 MG/ML IJ SOLN
INTRAMUSCULAR | Status: DC | PRN
  Administered 2017-10-02: 4 mg via INTRAVENOUS

## 2017-10-02 MED ORDER — ONDANSETRON HCL 4 MG/2ML IJ SOLN
INTRAMUSCULAR | Status: AC
  Filled 2017-10-02: qty 2

## 2017-10-02 MED ORDER — DEXAMETHASONE SODIUM PHOSPHATE 4 MG/ML IJ SOLN
INTRAMUSCULAR | Status: AC
  Filled 2017-10-02: qty 2

## 2017-10-02 MED ORDER — SUGAMMADEX SODIUM 500 MG/5ML IV SOLN
INTRAVENOUS | Status: AC
  Filled 2017-10-02: qty 5

## 2017-10-02 MED ORDER — KETOROLAC TROMETHAMINE 30 MG/ML IJ SOLN
30.0000 mg | Freq: Four times a day (QID) | INTRAMUSCULAR | Status: DC
  Administered 2017-10-02 – 2017-10-04 (×6): 30 mg via INTRAVENOUS
  Filled 2017-10-02 (×6): qty 1

## 2017-10-02 MED ORDER — PROPOFOL 10 MG/ML IV BOLUS
INTRAVENOUS | Status: AC
  Filled 2017-10-02: qty 40

## 2017-10-02 MED ORDER — ROCURONIUM BROMIDE 50 MG/5ML IV SOLN
INTRAVENOUS | Status: AC
  Filled 2017-10-02: qty 2

## 2017-10-02 MED ORDER — ENOXAPARIN SODIUM 40 MG/0.4ML ~~LOC~~ SOLN
40.0000 mg | SUBCUTANEOUS | Status: DC
  Administered 2017-10-03 – 2017-10-04 (×2): 40 mg via SUBCUTANEOUS
  Filled 2017-10-02 (×2): qty 0.4

## 2017-10-02 MED ORDER — PROMETHAZINE HCL 25 MG/ML IJ SOLN
6.2500 mg | INTRAMUSCULAR | Status: DC | PRN
  Administered 2017-10-02: 6.25 mg via INTRAVENOUS

## 2017-10-02 MED ORDER — PROPOFOL 10 MG/ML IV BOLUS
INTRAVENOUS | Status: DC | PRN
  Administered 2017-10-02: 200 mg via INTRAVENOUS

## 2017-10-02 MED ORDER — LIDOCAINE HCL (PF) 1 % IJ SOLN
INTRAMUSCULAR | Status: AC
  Filled 2017-10-02: qty 5

## 2017-10-02 MED ORDER — FENTANYL CITRATE (PF) 100 MCG/2ML IJ SOLN
25.0000 ug | INTRAMUSCULAR | Status: DC | PRN
  Administered 2017-10-02: 50 ug via INTRAVENOUS

## 2017-10-02 MED ORDER — CEFAZOLIN SODIUM-DEXTROSE 2-4 GM/100ML-% IV SOLN
2.0000 g | INTRAVENOUS | Status: AC
  Administered 2017-10-02: 2 g via INTRAVENOUS
  Filled 2017-10-02: qty 100

## 2017-10-02 MED ORDER — SODIUM CHLORIDE 0.9% FLUSH
INTRAVENOUS | Status: AC
  Filled 2017-10-02: qty 10

## 2017-10-02 MED ORDER — SPIRONOLACTONE 25 MG PO TABS
50.0000 mg | ORAL_TABLET | Freq: Every day | ORAL | Status: DC
  Administered 2017-10-03 – 2017-10-04 (×2): 50 mg via ORAL
  Filled 2017-10-02 (×2): qty 2

## 2017-10-02 MED ORDER — ONDANSETRON HCL 4 MG/2ML IJ SOLN
INTRAMUSCULAR | Status: DC | PRN
  Administered 2017-10-02: 4 mg via INTRAVENOUS

## 2017-10-02 MED ORDER — HEMOSTATIC AGENTS (NO CHARGE) OPTIME
TOPICAL | Status: DC | PRN
  Administered 2017-10-02: 1 via TOPICAL

## 2017-10-02 SURGICAL SUPPLY — 57 items
APL SKNCLS STERI-STRIP NONHPOA (GAUZE/BANDAGES/DRESSINGS) ×2
BENZOIN TINCTURE PRP APPL 2/3 (GAUZE/BANDAGES/DRESSINGS) ×4 IMPLANT
BLADE SURG SZ10 CARB STEEL (BLADE) ×4 IMPLANT
CELLS DAT CNTRL 66122 CELL SVR (MISCELLANEOUS) IMPLANT
CLOSURE WOUND 1/2 X4 (GAUZE/BANDAGES/DRESSINGS) ×1
CLOTH BEACON ORANGE TIMEOUT ST (SAFETY) ×4 IMPLANT
COVER LIGHT HANDLE STERIS (MISCELLANEOUS) ×8 IMPLANT
DECANTER SPIKE VIAL GLASS SM (MISCELLANEOUS) ×4 IMPLANT
DRAPE WARM FLUID 44X44 (DRAPE) ×4 IMPLANT
DRSG OPSITE POSTOP 4X10 (GAUZE/BANDAGES/DRESSINGS) ×7 IMPLANT
DURAPREP 26ML APPLICATOR (WOUND CARE) ×4 IMPLANT
ELECT REM PT RETURN 9FT ADLT (ELECTROSURGICAL) ×4
ELECTRODE REM PT RTRN 9FT ADLT (ELECTROSURGICAL) ×2 IMPLANT
EVACUATOR DRAINAGE 10X20 100CC (DRAIN) IMPLANT
EVACUATOR SILICONE 100CC (DRAIN)
GAUZE SPONGE 4X4 12PLY STRL (GAUZE/BANDAGES/DRESSINGS) ×4 IMPLANT
GAUZE SPONGE 4X4 16PLY XRAY LF (GAUZE/BANDAGES/DRESSINGS) ×3 IMPLANT
GLOVE BIOGEL PI IND STRL 7.0 (GLOVE) ×6 IMPLANT
GLOVE BIOGEL PI IND STRL 9 (GLOVE) ×2 IMPLANT
GLOVE BIOGEL PI INDICATOR 7.0 (GLOVE) ×6
GLOVE BIOGEL PI INDICATOR 9 (GLOVE) ×2
GLOVE ECLIPSE 9.0 STRL (GLOVE) ×4 IMPLANT
GOWN SPEC L3 XXLG W/TWL (GOWN DISPOSABLE) ×4 IMPLANT
GOWN STRL REUS W/TWL LRG LVL3 (GOWN DISPOSABLE) ×8 IMPLANT
HEMOSTAT ARISTA ABSORB 3G PWDR (MISCELLANEOUS) ×3 IMPLANT
INST SET MAJOR GENERAL (KITS) ×4 IMPLANT
KIT BLADEGUARD II DBL (SET/KITS/TRAYS/PACK) ×3 IMPLANT
KIT TURNOVER KIT A (KITS) ×4 IMPLANT
MANIFOLD NEPTUNE II (INSTRUMENTS) ×4 IMPLANT
NDL HYPO 25X1 1.5 SAFETY (NEEDLE) ×1 IMPLANT
NEEDLE HYPO 25X1 1.5 SAFETY (NEEDLE) ×4 IMPLANT
NS IRRIG 1000ML POUR BTL (IV SOLUTION) ×8 IMPLANT
PACK ABDOMINAL MAJOR (CUSTOM PROCEDURE TRAY) ×4 IMPLANT
PAD ARMBOARD 7.5X6 YLW CONV (MISCELLANEOUS) ×4 IMPLANT
RETRACTOR WND ALEXIS 18 MED (MISCELLANEOUS) IMPLANT
RETRACTOR WND ALEXIS 25 LRG (MISCELLANEOUS) ×1 IMPLANT
RTRCTR WOUND ALEXIS 18CM MED (MISCELLANEOUS)
RTRCTR WOUND ALEXIS 25CM LRG (MISCELLANEOUS) ×4
SET BASIN LINEN APH (SET/KITS/TRAYS/PACK) ×4 IMPLANT
SPONGE LAP 18X18 X RAY DECT (DISPOSABLE) ×3 IMPLANT
STRIP CLOSURE SKIN 1/2X4 (GAUZE/BANDAGES/DRESSINGS) ×4 IMPLANT
SUT CHROMIC 0 CT 1 (SUTURE) ×32 IMPLANT
SUT CHROMIC 2 0 CT 1 (SUTURE) ×8 IMPLANT
SUT ETHILON 3 0 FSL (SUTURE) IMPLANT
SUT PDS AB CT VIOLET #0 27IN (SUTURE) IMPLANT
SUT PLAIN 2 0 XLH (SUTURE) ×3 IMPLANT
SUT PLAIN CT 1/2CIR 2-0 27IN (SUTURE) ×4 IMPLANT
SUT PROLENE 0 CT 1 30 (SUTURE) ×6 IMPLANT
SUT VIC AB 0 CT1 27 (SUTURE) ×16
SUT VIC AB 0 CT1 27XBRD ANTBC (SUTURE) ×2 IMPLANT
SUT VIC AB 0 CT1 27XCR 8 STRN (SUTURE) ×3 IMPLANT
SUT VICRYL 4 0 KS 27 (SUTURE) ×4 IMPLANT
SYR BULB IRRIGATION 50ML (SYRINGE) ×4 IMPLANT
SYR CONTROL 10ML LL (SYRINGE) ×4 IMPLANT
TOWEL OR 17X26 4PK STRL BLUE (TOWEL DISPOSABLE) ×4 IMPLANT
TOWEL SURG RFD BLUE STRL DISP (DISPOSABLE) ×4 IMPLANT
TRAY FOLEY MTR SLVR 16FR STAT (SET/KITS/TRAYS/PACK) ×4 IMPLANT

## 2017-10-02 NOTE — Progress Notes (Signed)
Awake. Crying cursing. resp adequate/nonlabored. O2 continued. C/O postop abd pain. Rates pain 10. Pain med given as noted on MAR.

## 2017-10-02 NOTE — Interval H&P Note (Signed)
History and Physical Interval Note:  10/02/2017 9:48 AM  Whitney Bush  has presented today for surgery, with the diagnosis of Systematic Uterine Fiboids With Anemia; Cancer Risk Reduction   The various methods of treatment have been discussed with the patient and family. After consideration of risks, benefits and other options for treatment, the patient has consented to  Procedure(s): HYSTERECTOMY SUPRACERVICAL ABDOMINAL (N/A) BILATERAL SALPINGECTOMY (Bilateral) as a surgical intervention .  The patient's history has been reviewed, patient examined, no change in status, stable for surgery.  I have reviewed the patient's chart and labs.  Questions were answered to the patient's satisfaction.   Pt confirms surgery plans for midline incision, to umbilicus.  Jonnie Kind

## 2017-10-02 NOTE — Progress Notes (Signed)
Resting quietly. Sleeping at intervals. resp adequate/nonlabored. O2 continued.

## 2017-10-02 NOTE — Progress Notes (Signed)
Awake. Uncooperative. Continues to c/o postop abd pain and cramping. Restless. C/O SCD's not comfortable. Wants SCD's off "NOW". SCD's removed per pt request.

## 2017-10-02 NOTE — Anesthesia Procedure Notes (Signed)
Procedure Name: Intubation Date/Time: 10/02/2017 10:01 AM Performed by: Talbot Grumbling, CRNA Pre-anesthesia Checklist: Patient identified, Emergency Drugs available, Suction available and Patient being monitored Patient Re-evaluated:Patient Re-evaluated prior to induction Oxygen Delivery Method: Circle system utilized Preoxygenation: Pre-oxygenation with 100% oxygen Induction Type: IV induction Ventilation: Mask ventilation without difficulty Laryngoscope Size: Mac and 3 Grade View: Grade II Tube type: Oral Tube size: 7.0 mm Number of attempts: 1 Airway Equipment and Method: Stylet Placement Confirmation: ETT inserted through vocal cords under direct vision,  positive ETCO2 and breath sounds checked- equal and bilateral Secured at: 22 cm Tube secured with: Tape Dental Injury: Teeth and Oropharynx as per pre-operative assessment

## 2017-10-02 NOTE — Op Note (Signed)
Please see the brief operative note for surgical details 

## 2017-10-02 NOTE — Transfer of Care (Signed)
Immediate Anesthesia Transfer of Care Note  Patient: Whitney Bush  Procedure(s) Performed: HYSTERECTOMY SUPRACERVICAL ABDOMINAL (N/A Abdomen)  Patient Location: PACU  Anesthesia Type:General  Level of Consciousness: awake, alert  and oriented  Airway & Oxygen Therapy: Patient Spontanous Breathing  Post-op Assessment: Report given to RN and Post -op Vital signs reviewed and stable  Post vital signs: Reviewed and stable  Last Vitals:  Vitals Value Taken Time  BP 149/93 10/02/2017 12:35 PM  Temp    Pulse 94 10/02/2017 12:37 PM  Resp 26 10/02/2017 12:37 PM  SpO2 93 % 10/02/2017 12:37 PM  Vitals shown include unvalidated device data.  Last Pain:  Vitals:   10/02/17 0745  TempSrc: Oral  PainSc: 5       Patients Stated Pain Goal: 6 (11/55/20 8022)  Complications: No apparent anesthesia complications

## 2017-10-02 NOTE — Anesthesia Preprocedure Evaluation (Addendum)
Anesthesia Evaluation  Patient identified by MRN, date of birth, ID band Patient awake    Reviewed: Allergy & Precautions, NPO status , Patient's Chart, lab work & pertinent test results  Airway Mallampati: III  TM Distance: >3 FB Neck ROM: Full    Dental no notable dental hx.    Pulmonary neg pulmonary ROS, COPD, former smoker,  States smoked MJ remotely  Denies any recent smoking of any kind States last used inhalers about 1 year ago -somewhat evasive in answering all questions    Pulmonary exam normal breath sounds clear to auscultation       Cardiovascular Exercise Tolerance: Poor hypertension, Pt. on medications negative cardio ROS Normal cardiovascular examII Rhythm:Regular Rate:Normal  States limited by foot pain -denies any CP/DOE- but states limits what she does -denies MI or Cardiac interventions in past    Neuro/Psych  Headaches, negative neurological ROS  negative psych ROS   GI/Hepatic negative GI ROS, Neg liver ROS, GERD  ,(+) Cirrhosis       , Hepatitis -Pt states has cirrhosis ,states history of hepatitis in past , states treated -states not sure what kind of hepatitis she has. Denies ever being tapped for ascites  Denies meds or reflux symptoms    Endo/Other  negative endocrine ROSHypothyroidism   Renal/GU Renal diseasenegative Renal ROSCr. Clearance normal - per chart h/o anasarca in past -quite evasive with ALL questions -difficult to obtain history   negative genitourinary   Musculoskeletal negative musculoskeletal ROS (+)   Abdominal   Peds negative pediatric ROS (+)  Hematology negative hematology ROS (+) anemia ,   Anesthesia Other Findings   Reproductive/Obstetrics negative OB ROS                            Anesthesia Physical Anesthesia Plan  ASA: III  Anesthesia Plan: General   Post-op Pain Management:    Induction: Intravenous  PONV Risk Score and  Plan:   Airway Management Planned: Oral ETT  Additional Equipment:   Intra-op Plan:   Post-operative Plan: Extubation in OR  Informed Consent: I have reviewed the patients History and Physical, chart, labs and discussed the procedure including the risks, benefits and alternatives for the proposed anesthesia with the patient or authorized representative who has indicated his/her understanding and acceptance.   Dental advisory given  Plan Discussed with: CRNA  Anesthesia Plan Comments:         Anesthesia Quick Evaluation

## 2017-10-02 NOTE — Progress Notes (Signed)
From OR. Awake. Moaning/groaning. Crying. C/O postop abd pain and need to void. Informed had foley cath in place. States "I can't piss with that thing in." reassurance given. Rates pain 10. Pain med per anesthesia.

## 2017-10-02 NOTE — Op Note (Signed)
10/02/2017  12:43 PM  PATIENT:  Whitney Bush  47 y.o. female  PRE-OPERATIVE DIAGNOSIS:  Systematic Uterine Fiboids With Anemia;    POST-OPERATIVE DIAGNOSIS:  Systematic Uterine Fiboids With Anemia;    PROCEDURE:  Procedure(s): HYSTERECTOMY SUPRACERVICAL ABDOMINAL (N/A)  SURGEON:  Surgeon(s) and Role:    * Jonnie Kind, MD - Primary  PHYSICIAN ASSISTANT:   ASSISTANTS: Corrie Dandy, RNFA   ANESTHESIA:   general  EBL:  400 mL   BLOOD ADMINISTERED:none  DRAINS: Urinary Catheter (Foley)   LOCAL MEDICATIONS USED:  NONE  SPECIMEN:  Source of Specimen:  Uterus, lower uterine segment  DISPOSITION OF SPECIMEN:  PATHOLOGY  COUNTS:  YES  TOURNIQUET:  * No tourniquets in log *  DICTATION: .Dragon Dictation  PLAN OF CARE: Admit to inpatient   PATIENT DISPOSITION:  PACU - hemodynamically stable.   Delay start of Pharmacological VTE agent (>24hrs) due to surgical blood loss or risk of bleeding: not applicable Details of procedure: Patient was taken the operating room prepped and draped for abdominal surgery, with prepping notable for a nondraining swollen area on the pannus approximately 10 cm of the intended midline vertical incision.  She additionally had 2 somewhat fluctuant nondraining areas just lateral to each anterior superior iliac crest and one on the inner thigh.  These were suspicious for infection, possibly MRSA.  We decided to place Tegaderms over these 4 sites in order to reduce risk of contamination the incision before prepping. Abdomen was prepped and draped with Foley catheter in place and timeout conducted.  Ancef 2 g administered. Midline vertical incision was made staying out of the pannus crease and going up to the umbilicus and approximately 2 cm to above it on the left.  The sharp dissection down to the fascia was performed.  We found Prolene sutures from previous midline laparotomy.  We opened the peritoneum in the midline.  There was some omental  adhesions from the anterior abdominal wall which were taken down.  The uterus was retroverted enlarged, 14 weeks size, and had adhesions to the back of the bladder.  These were taken down sharply bowel was packed away Alexis wound retractor positioned, and attention directed to the round ligaments.  The left side was more easily visible so we started on the right where it was taken more difficult.  The right round ligament was doubly clamped cut and ligated and sutured hemostasis.  The left utero-ovarian ligaments and fallopian tube were doubly grasped with Heaney clamps, transected clamped cut and suture-ligated.  All the dissection which showed a hypervascular tendency and there was significant amount of gradual ooze throughout the case. Uterine vessels on the right side were palpated, doubly clamped with curved Heaney clamp and back clamped with a Kelly clamp transected and doubly ligated with 0 Vicryl.  Bladder flap and essentially taken down by the dissection of the anterior bladder flap adhesions.  Attention was then directed to the left side where similar technique of round ligament identification clamping ligating transecting and tagging was performed.  Left utero-ovarian ligaments were clamped cut and suture-ligated simultaneously with the left fallopian tube.  The bowel was adherent to the side of the tube and ovary on the left side.  The uterine vessels were difficult to access the broad ligament and uterine vessels were captured in 2 curved Heaney clamps with Kelly clamp for backbleeding transected and doubly ligated.  The vessels were somewhat splayed so a straight Heaney clamp was necessary to complete being sure capture  the uterine vessels on the left neck dissection freed up with a straight Heaney clamp, and suture ligature completed the mobilization of the uterus.  The uterus was enlarged and obscured visualization due to its size.  We amputated the uterine body off the lower uterine segment to  allow improved visualization.  At this time we can identify the uterine vessels on the right side sufficiently that a straight Heaney clamp to be placed on the right side clamping the vessels again and suture ligation performed.  The remnants of the lower uterine segment was amputated off the off of the cervix, with the posterior aspects of this dissection done just above the insertion of the uterosacral ligaments posteriorly.  The use the endocervical surface nail was somewhat cored out in a ice cream cone shaped and then was sewn together with a series of interrupted 0 Vicryl sutures sewn front to back.  Hemostasis and pelvic support was good pelvis was inspected.  There was a couple of areas that required point cautery.  The effort to remove the tubes was being considered but the adhesions particularly on the left side involved the tube and also had a tendency to switch required point cautery sufficiently to develop hemostasis that it was felt that removal of tubes would be more likely to cause harm than benefit as per previous discussions during preop evaluation, we  chose to leave the tubes  in situ.  Pelvis was irrigated confirmed as adequately hemostatic.  We placed starch coagulation powder on the left ovary and the lower uterine segment stump.  Hemostasis appeared to be satisfactory.  The lateral pedicles were inspected and confirmed was adequately hemostatic.  The omental adhesion pedicles were inspected and hemostatic.  Laparotomy equipment was removed, anterior anterior peritoneum closed with 2-0 chromic,, the fascia closed in a running fashion with 0 Prolene, and subcutaneous tissues irrigated copiously then reapproximated with 2 oh plain followed by subcuticular 4-0 Vicryl closure of the skin sponge and needle counts were correct patient to recovery in stable condition \

## 2017-10-02 NOTE — Anesthesia Postprocedure Evaluation (Signed)
Anesthesia Post Note  Patient: Whitney Bush  Procedure(s) Performed: HYSTERECTOMY SUPRACERVICAL ABDOMINAL (N/A Abdomen)  Patient location during evaluation: PACU Anesthesia Type: General Level of consciousness: awake and alert Pain management: pain level controlled Respiratory status: spontaneous breathing Cardiovascular status: blood pressure returned to baseline Postop Assessment: no apparent nausea or vomiting Anesthetic complications: no     Last Vitals:  Vitals:   10/02/17 0835 10/02/17 0840  BP: 126/82   Pulse:    Resp: 20 20  Temp:    SpO2: 99% 100%    Last Pain:  Vitals:   10/02/17 0745  TempSrc: Oral  PainSc: 5                  Edmon Magid

## 2017-10-02 NOTE — Progress Notes (Signed)
Mother  Geraldine Contras: [phone]                                  Cell: [phone] Mother left to go to work requests to be called when patient out of surgery.

## 2017-10-03 ENCOUNTER — Encounter (HOSPITAL_COMMUNITY): Payer: Self-pay | Admitting: Obstetrics and Gynecology

## 2017-10-03 LAB — CBC
HEMATOCRIT: 31.3 % — AB (ref 36.0–46.0)
HEMOGLOBIN: 9.6 g/dL — AB (ref 12.0–15.0)
MCH: 27 pg (ref 26.0–34.0)
MCHC: 30.7 g/dL (ref 30.0–36.0)
MCV: 87.9 fL (ref 78.0–100.0)
Platelets: 155 10*3/uL (ref 150–400)
RBC: 3.56 MIL/uL — AB (ref 3.87–5.11)
RDW: 18.4 % — ABNORMAL HIGH (ref 11.5–15.5)
WBC: 11.9 10*3/uL — ABNORMAL HIGH (ref 4.0–10.5)

## 2017-10-03 LAB — BASIC METABOLIC PANEL
ANION GAP: 8 (ref 5–15)
BUN: 17 mg/dL (ref 6–20)
CHLORIDE: 104 mmol/L (ref 98–111)
CO2: 24 mmol/L (ref 22–32)
Calcium: 8.1 mg/dL — ABNORMAL LOW (ref 8.9–10.3)
Creatinine, Ser: 0.96 mg/dL (ref 0.44–1.00)
GFR calc non Af Amer: >60 mL/min (ref >60)
Glucose, Bld: 134 mg/dL — ABNORMAL HIGH (ref 70–99)
POTASSIUM: 4.3 mmol/L (ref 3.5–5.1)
Sodium: 136 mmol/L (ref 135–145)

## 2017-10-03 MED ORDER — HYDROMORPHONE 1 MG/ML IV SOLN: INTRAVENOUS | Status: AC

## 2017-10-03 NOTE — Progress Notes (Signed)
Patients foley catheter emptied yellow, clear urine. Foley removed. Tolerated well. Educated patient to urinate as soon as possible. Will continue to monitor.

## 2017-10-03 NOTE — Addendum Note (Signed)
Addendum  created 10/03/17 1526 by Mickel Baas, CRNA   Sign clinical note

## 2017-10-03 NOTE — Progress Notes (Signed)
1 Day Post-Op Procedure(s) (LRB): HYSTERECTOMY SUPRACERVICAL ABDOMINAL (N/A)  Subjective: Patient reports incisional pain, tolerating PO and no problems voiding. Pt reports she acknowledges continued alcohol use. She lives with her mother, husband died .   Objective: I have reviewed patient's vital signs, medications and labs. CBC CBC Latest Ref Rng & Units 10/03/2017 09/27/2017 07/30/2017  WBC 4.0 - 10.5 K/uL 11.9(H) 10.5 7.9  Hemoglobin 12.0 - 15.0 g/dL 9.6(L) 10.9(L) 12.8  Hematocrit 36.0 - 46.0 % 31.3(L) 34.9(L) 40.7  Platelets 150 - 400 K/uL 155 158 117(L)    General: alert, distracted and no distress Resp: clear to auscultation bilaterally GI: soft, non-tender; bowel sounds normal; no masses,  no organomegaly and incision: clean, dry, intact and scanty amount on dressing drainage present  Assessment: s/p Procedure(s): HYSTERECTOMY SUPRACERVICAL ABDOMINAL (N/A): stable, progressing well and tolerating diet  Plan: Advance diet Advance to PO medication keep IV and PCA in place til the PM.  LOS: 1 day    Jonnie Kind 10/03/2017, 8:38 AM

## 2017-10-03 NOTE — Anesthesia Postprocedure Evaluation (Signed)
Anesthesia Post Note  Patient: Whitney Bush  Procedure(s) Performed: HYSTERECTOMY SUPRACERVICAL ABDOMINAL (N/A Abdomen)  Patient location during evaluation: Nursing Unit Anesthesia Type: General Level of consciousness: awake and alert and oriented Pain management: pain level controlled Vital Signs Assessment: post-procedure vital signs reviewed and stable Cardiovascular status: stable Postop Assessment: no apparent nausea or vomiting Anesthetic complications: no     Last Vitals:  Vitals:   10/03/17 1308 10/03/17 1448  BP:  130/78  Pulse:  (!) 108  Resp: 18 18  Temp:  37.2 C  SpO2: 96%     Last Pain:  Vitals:   10/03/17 1448  TempSrc: Oral  PainSc:                  Tranice Laduke A

## 2017-10-04 ENCOUNTER — Encounter (HOSPITAL_COMMUNITY): Payer: Self-pay | Admitting: Obstetrics and Gynecology

## 2017-10-04 MED ORDER — DOXYCYCLINE HYCLATE 100 MG PO CAPS: 100.0000 mg | ORAL_CAPSULE | Freq: Two times a day (BID) | ORAL | 1 refills | Status: DC

## 2017-10-04 MED ORDER — OXYCODONE-ACETAMINOPHEN 5-325 MG PO TABS: 1.0000 | ORAL_TABLET | ORAL | 0 refills | Status: DC | PRN

## 2017-10-04 NOTE — Progress Notes (Signed)
Discharge instructions read to patient.  Pt verbalized understanding of all instructions. Discharged to home with mother.

## 2017-10-04 NOTE — Discharge Summary (Signed)
Physician Discharge Summary  Patient ID: Whitney Bush MRN: [id number] DOB/AGE: December 03, 1970 47 y.o.  Admit date: 10/02/2017 Discharge date: 10/04/2017  Admission Diagnoses: Symptomatic uterine fibroids                                         History of severe anemia due to menorrhagia                                         Menorrhagia                                         Chronic alcoholism                                         Morbid obesity Body mass index is 45.17 kg/m.                                           Discharge Diagnoses: Symptomatic uterine fibroids were removed                                         History of severe anemia due to menorrhagia                                         Menorrhagia, resolved                                         Chronic alcoholism                                         Morbid obesity                                          Active Problems:   Status post abdominal supracervical subtotal hysterectomy   Discharged Condition: good  Hospital Course: Whitney D Brownis a 47 y.o.G2P1011 here for surgical management of Abdominal Supracervical Hysterectomy and Bilateral Salpingectomy.For symptomatic uterine fibroids and heavy bleeding. Significant preoperative concerns. PT has had an etOH problem, currently she denies etoh use. She had a C section but no other surgeries in the area.   Consults: None  Significant Diagnostic Studies: labs:  CBC Latest Ref Rng & Units 10/03/2017 09/27/2017 07/30/2017  WBC 4.0 - 10.5 K/uL 11.9(H) 10.5 7.9  Hemoglobin 12.0 - 15.0 g/dL 9.6(L) 10.9(L) 12.8  Hematocrit 36.0 - 46.0 % 31.3(L) 34.9(L) 40.7  Platelets 150 - 400 K/uL 155 158 117(L)   CMP Latest Ref Rng & Units 10/03/2017  09/27/2017 07/30/2017  Glucose 70 - 99 mg/dL 134(H) 92 93  BUN 6 - 20 mg/dL 17 13 7   Creatinine 0.44 - 1.00 mg/dL 0.96 0.93 0.84  Sodium 135 - 145 mmol/L 136 139 138  Potassium 3.5 - 5.1 mmol/L 4.3 3.8 3.8  Chloride 98 - 111  mmol/L 104 107 102  CO2 22 - 32 mmol/L 24 24 25   Calcium 8.9 - 10.3 mg/dL 8.1(L) 9.1 9.7  Total Protein 6.5 - 8.1 g/dL - 8.3(H) -  Total Bilirubin 0.3 - 1.2 mg/dL - 0.8 -  Alkaline Phos 38 - 126 U/L - 91 -  AST 15 - 41 U/L - 36 -  ALT 0 - 44 U/L - 27 -     Treatments: surgery: Abdominal supracervical hysterectomy  Discharge Exam: Blood pressure 120/78, pulse 95, temperature 98.3 F (36.8 C), temperature source Oral, resp. rate 18, height 5\' 3"  (1.6 m), weight 115.7 kg, last menstrual period 09/12/2017, SpO2 98 %. Physical Examination: General appearance - alert, well appearing, and in no distress, oriented to person, place, and time and overweight Mental status - alert, oriented to person, place, and time, normal mood, behavior, speech, dress, motor activity, and thought processes Chest - clear to auscultation, no wheezes, rales or rhonchi, symmetric air entry Abdomen - soft, nontender, nondistended, no masses or organomegaly Midline incision is intact.  There is been a little bit of serous drainage from the inferior aspects of the midline vertical incision but no erythema swelling or suspicion of hematoma Extremities - peripheral pulses normal, no pedal edema, no clubbing or cyanosis, Homan's sign negative bilaterally Skin - normal coloration and turgor, no rashes, no suspicious skin lesions noted   Disposition:  Home care leave dressing in place which is been replaced immediately before discharge.  Discharge home on Percocet, strong encouragement to avoid alcohol wound check 1 week and [facility]   Follow-up Information    Jonnie Kind, MD Follow up in 1 week(s).   Specialties:  Obstetrics and Gynecology, Radiology Contact information: [place] [address] [phone]           Signed: Jonnie Kind 10/04/2017, 8:40 AM

## 2017-10-08 ENCOUNTER — Telehealth: Payer: Self-pay | Admitting: *Deleted

## 2017-10-08 NOTE — Telephone Encounter (Signed)
Almyra Free with [facility] nurse called regarding patient's depression screening score.  Patient has depression score of 24 out of 27. Patient reports being depressed, unable to sleep and poor appetite. Almyra Free states patient does not seem to be in immanent danger and does have some support at home. Has scheduled appt on Wed. But wanted [place] to be aware as patient is not currently on any medications.   [phone] number for Almyra Free.

## 2017-10-10 ENCOUNTER — Encounter: Payer: Self-pay | Admitting: Obstetrics and Gynecology

## 2017-10-10 ENCOUNTER — Ambulatory Visit (INDEPENDENT_AMBULATORY_CARE_PROVIDER_SITE_OTHER): Payer: Medicaid Other | Admitting: Obstetrics and Gynecology

## 2017-10-10 ENCOUNTER — Other Ambulatory Visit: Payer: Self-pay

## 2017-10-10 VITALS — BP 124/81 | HR 101 | Ht 63.0 in | Wt 252.0 lb

## 2017-10-10 DIAGNOSIS — Z9889 Other specified postprocedural states: Secondary | ICD-10-CM

## 2017-10-10 DIAGNOSIS — Z09 Encounter for follow-up examination after completed treatment for conditions other than malignant neoplasm: Secondary | ICD-10-CM

## 2017-10-10 LAB — TYPE AND SCREEN
ABO/RH(D): A POS
Antibody Screen: NEGATIVE
Unit division: 0
Unit division: 0

## 2017-10-10 LAB — BPAM RBC
Blood Product Expiration Date: 201908202359
Blood Product Expiration Date: 201908312359
Unit Type and Rh: 6200
Unit Type and Rh: 6200

## 2017-10-10 MED ORDER — TRAMADOL HCL 50 MG PO TABS: 50.0000 mg | ORAL_TABLET | Freq: Four times a day (QID) | ORAL | 0 refills | Status: DC | PRN

## 2017-10-10 MED ORDER — LACTULOSE 10 GM/15ML PO SOLN: 20.0000 g | Freq: Two times a day (BID) | ORAL | 10 refills | Status: DC | PRN

## 2017-10-10 NOTE — Progress Notes (Signed)
   Subjective:  Whitney Bush is a 47 y.o. female now 1 weeks status post supracervical hysterectomy.  Midline incision Review of Systems Negative except chronic constipation   Diet:   reg   Bowel movements : abnormal with requires lactulose, given refils.  Pain is controlled with current analgesics. Medications being used: narcotic analgesics including oxycodone/acetaminophen (Percocet, Tylox).  Pt denies alcohol use since surgery  Objective:  BP 124/81 (BP Location: Right Arm, Patient Position: Sitting, Cuff Size: Large)   Pulse (!) 101   Ht 5\' 3"  (1.6 m)   Wt 252 lb (114.3 kg)   LMP 09/12/2017 (Exact Date) Comment: still having menses as of 09/27/2017  BMI 44.64 kg/m  General:Well developed, well nourished.  No acute distress. Abdomen: Bowel sounds normal, soft, non-tender.   Incision(s): N/Amidline, dressing removed  Intact, slight bruising.     Assessment:  Post-Op 1 weeks s/p supracervical hysterectomy   stable postoperatively.   Plan:  1.Wound care discussed   2. . current medications.change analgesic to Tramadol plus ibuprofen 3. Activity restrictions: routine postop 4. return to work: 4 weeks. 5. Follow up in 4 weeks.

## 2017-10-23 ENCOUNTER — Emergency Department (HOSPITAL_COMMUNITY)
Admission: EM | Admit: 2017-10-23 | Discharge: 2017-10-23 | Disposition: A | Discharge status: Home / Self Care | Payer: Medicaid Other | Attending: Emergency Medicine | Admitting: Emergency Medicine

## 2017-10-23 ENCOUNTER — Emergency Department (HOSPITAL_COMMUNITY): Payer: Medicaid Other

## 2017-10-23 ENCOUNTER — Other Ambulatory Visit: Payer: Self-pay

## 2017-10-23 ENCOUNTER — Encounter (HOSPITAL_COMMUNITY): Payer: Self-pay | Admitting: Registered Nurse

## 2017-10-23 DIAGNOSIS — R45851 Suicidal ideations: Secondary | ICD-10-CM | POA: Diagnosis not present

## 2017-10-23 DIAGNOSIS — R443 Hallucinations, unspecified: Secondary | ICD-10-CM

## 2017-10-23 DIAGNOSIS — N189 Chronic kidney disease, unspecified: Secondary | ICD-10-CM | POA: Diagnosis not present

## 2017-10-23 DIAGNOSIS — J449 Chronic obstructive pulmonary disease, unspecified: Secondary | ICD-10-CM | POA: Insufficient documentation

## 2017-10-23 DIAGNOSIS — E039 Hypothyroidism, unspecified: Secondary | ICD-10-CM | POA: Diagnosis not present

## 2017-10-23 DIAGNOSIS — R0602 Shortness of breath: Secondary | ICD-10-CM | POA: Diagnosis not present

## 2017-10-23 DIAGNOSIS — Z87891 Personal history of nicotine dependence: Secondary | ICD-10-CM | POA: Insufficient documentation

## 2017-10-23 DIAGNOSIS — I129 Hypertensive chronic kidney disease with stage 1 through stage 4 chronic kidney disease, or unspecified chronic kidney disease: Secondary | ICD-10-CM | POA: Diagnosis not present

## 2017-10-23 DIAGNOSIS — Z79899 Other long term (current) drug therapy: Secondary | ICD-10-CM | POA: Insufficient documentation

## 2017-10-23 DIAGNOSIS — F333 Major depressive disorder, recurrent, severe with psychotic symptoms: Secondary | ICD-10-CM | POA: Diagnosis not present

## 2017-10-23 LAB — I-STAT TROPONIN, ED: Troponin i, poc: 0 ng/mL (ref 0.00–0.08)

## 2017-10-23 LAB — CBC WITH DIFFERENTIAL/PLATELET
Basophils Absolute: 0 10*3/uL (ref 0.0–0.1)
Basophils Relative: 0 %
Eosinophils Absolute: 0.1 10*3/uL (ref 0.0–0.7)
Eosinophils Relative: 1 %
HCT: 33.7 % — ABNORMAL LOW (ref 36.0–46.0)
Hemoglobin: 10.5 g/dL — ABNORMAL LOW (ref 12.0–15.0)
Lymphocytes Relative: 26 %
Lymphs Abs: 2.7 10*3/uL (ref 0.7–4.0)
MCH: 26.3 pg (ref 26.0–34.0)
MCHC: 31.2 g/dL (ref 30.0–36.0)
MCV: 84.5 fL (ref 78.0–100.0)
Monocytes Absolute: 0.7 10*3/uL (ref 0.1–1.0)
Monocytes Relative: 7 %
Neutro Abs: 6.9 10*3/uL (ref 1.7–7.7)
Neutrophils Relative %: 66 %
Platelets: 206 10*3/uL (ref 150–400)
RBC: 3.99 MIL/uL (ref 3.87–5.11)
RDW: 18.1 % — ABNORMAL HIGH (ref 11.5–15.5)
WBC: 10.4 10*3/uL (ref 4.0–10.5)

## 2017-10-23 LAB — COMPREHENSIVE METABOLIC PANEL
ALT: 32 U/L (ref 0–44)
AST: 67 U/L — ABNORMAL HIGH (ref 15–41)
Albumin: 3.6 g/dL (ref 3.5–5.0)
Alkaline Phosphatase: 88 U/L (ref 38–126)
Anion gap: 14 (ref 5–15)
BUN: 10 mg/dL (ref 6–20)
CO2: 22 mmol/L (ref 22–32)
Calcium: 9.1 mg/dL (ref 8.9–10.3)
Chloride: 106 mmol/L (ref 98–111)
Creatinine, Ser: 0.85 mg/dL (ref 0.44–1.00)
GFR calc Af Amer: >60 mL/min (ref >60)
GFR calc non Af Amer: >60 mL/min (ref >60)
Glucose, Bld: 105 mg/dL — ABNORMAL HIGH (ref 70–99)
Potassium: 3.4 mmol/L — ABNORMAL LOW (ref 3.5–5.1)
Sodium: 142 mmol/L (ref 135–145)
Total Bilirubin: 0.7 mg/dL (ref 0.3–1.2)
Total Protein: 8.5 g/dL — ABNORMAL HIGH (ref 6.5–8.1)

## 2017-10-23 LAB — URINALYSIS, ROUTINE W REFLEX MICROSCOPIC
Bilirubin Urine: NEGATIVE
Glucose, UA: NEGATIVE mg/dL
Ketones, ur: 20 mg/dL — AB
Leukocytes, UA: NEGATIVE
Nitrite: NEGATIVE
Protein, ur: 100 mg/dL — AB
Specific Gravity, Urine: 1.025 (ref 1.005–1.030)
pH: 5 (ref 5.0–8.0)

## 2017-10-23 LAB — RAPID URINE DRUG SCREEN, HOSP PERFORMED
Amphetamines: NOT DETECTED
Barbiturates: NOT DETECTED
Benzodiazepines: NOT DETECTED
Cocaine: NOT DETECTED
Opiates: NOT DETECTED
Tetrahydrocannabinol: NOT DETECTED

## 2017-10-23 LAB — SALICYLATE LEVEL: Salicylate Lvl: <7 mg/dL (ref 2.8–30.0)

## 2017-10-23 LAB — ETHANOL: Alcohol, Ethyl (B): 93 mg/dL — ABNORMAL HIGH (ref <10)

## 2017-10-23 LAB — ACETAMINOPHEN LEVEL: Acetaminophen (Tylenol), Serum: <10 ug/mL — ABNORMAL LOW (ref 10–30)

## 2017-10-23 LAB — D-DIMER, QUANTITATIVE (NOT AT ARMC): D-Dimer, Quant: 1.34 ug/mL-FEU — ABNORMAL HIGH (ref 0.00–0.50)

## 2017-10-23 MED ORDER — BENZONATATE 100 MG PO CAPS: 100.0000 mg | ORAL_CAPSULE | Freq: Three times a day (TID) | ORAL | 0 refills | Status: DC

## 2017-10-23 MED ORDER — SODIUM CHLORIDE 0.9 % IV BOLUS
500.0000 mL | Freq: Once | INTRAVENOUS | Status: AC
  Administered 2017-10-23: 500 mL via INTRAVENOUS

## 2017-10-23 MED ORDER — LORAZEPAM 2 MG/ML IJ SOLN: 0.0000 mg | Freq: Four times a day (QID) | INTRAMUSCULAR | Status: DC

## 2017-10-23 MED ORDER — LORAZEPAM 1 MG PO TABS: 0.0000 mg | ORAL_TABLET | Freq: Two times a day (BID) | ORAL | Status: DC

## 2017-10-23 MED ORDER — FLUCONAZOLE 150 MG PO TABS
150.0000 mg | ORAL_TABLET | Freq: Once | ORAL | Status: AC
  Administered 2017-10-23: 150 mg via ORAL
  Filled 2017-10-23: qty 1

## 2017-10-23 MED ORDER — ACETAMINOPHEN 325 MG PO TABS
650.0000 mg | ORAL_TABLET | Freq: Once | ORAL | Status: AC
  Administered 2017-10-23: 650 mg via ORAL
  Filled 2017-10-23: qty 2

## 2017-10-23 MED ORDER — IOPAMIDOL (ISOVUE-370) INJECTION 76%
100.0000 mL | Freq: Once | INTRAVENOUS | Status: AC | PRN
  Administered 2017-10-23: 100 mL via INTRAVENOUS

## 2017-10-23 MED ORDER — ALBUTEROL SULFATE HFA 108 (90 BASE) MCG/ACT IN AERS: 1.0000 | INHALATION_SPRAY | Freq: Four times a day (QID) | RESPIRATORY_TRACT | 0 refills | Status: DC | PRN

## 2017-10-23 MED ORDER — IBUPROFEN 400 MG PO TABS
600.0000 mg | ORAL_TABLET | Freq: Once | ORAL | Status: DC
  Filled 2017-10-23: qty 2

## 2017-10-23 MED ORDER — LORAZEPAM 2 MG/ML IJ SOLN
1.0000 mg | Freq: Once | INTRAMUSCULAR | Status: AC
  Administered 2017-10-23: 1 mg via INTRAVENOUS
  Filled 2017-10-23: qty 1

## 2017-10-23 MED ORDER — LORAZEPAM 2 MG/ML IJ SOLN: 0.0000 mg | Freq: Two times a day (BID) | INTRAMUSCULAR | Status: DC

## 2017-10-23 MED ORDER — VITAMIN B-1 100 MG PO TABS
100.0000 mg | ORAL_TABLET | Freq: Every day | ORAL | Status: DC
  Administered 2017-10-23: 100 mg via ORAL
  Filled 2017-10-23: qty 1

## 2017-10-23 MED ORDER — THIAMINE HCL 100 MG/ML IJ SOLN: 100.0000 mg | Freq: Every day | INTRAMUSCULAR | Status: DC

## 2017-10-23 MED ORDER — LORAZEPAM 1 MG PO TABS: 0.0000 mg | ORAL_TABLET | Freq: Four times a day (QID) | ORAL | Status: DC
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
## 2017-10-23 NOTE — Progress Notes (Addendum)
Disposition CSW added referrals to [facility] and [facility] in [place], [place] to pt'd d/c instructions.  Areatha Keas. Judi Cong, MSW, [place] Disposition Clinical Social Work [phone] (cell) [phone] (office)

## 2017-10-23 NOTE — ED Notes (Signed)
Security at bedside to want pt.
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
## 2017-10-23 NOTE — Discharge Instructions (Addendum)
Take Tessalon every 8 hours as needed for cough.  Use albuterol inhaler every 6 hours as needed for wheezing or shortness of breath.  Please follow-up with your primary care provider and your surgeon for follow-up as discussed.  Please follow-up with 1 of the psychiatric resources below for further evaluation and treatment of your depression.  Please return the emergency department if you develop any new or worsening symptoms.

## 2017-11-07 ENCOUNTER — Telehealth: Payer: Self-pay | Admitting: Obstetrics and Gynecology

## 2017-11-07 ENCOUNTER — Encounter: Payer: Medicaid Other | Admitting: Obstetrics and Gynecology
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
## 2017-11-07 NOTE — Telephone Encounter (Signed)
Patient called stating that she needs for Dr.Ferguson to give her call, pt states that she needs only him to call and it is very important she speaks to him today. Please contact pt

## 2017-11-12 ENCOUNTER — Other Ambulatory Visit: Payer: Self-pay

## 2017-11-12 DIAGNOSIS — D649 Anemia, unspecified: Secondary | ICD-10-CM

## 2017-11-12 DIAGNOSIS — K7030 Alcoholic cirrhosis of liver without ascites: Secondary | ICD-10-CM

## 2017-11-15 ENCOUNTER — Ambulatory Visit: Payer: Medicaid Other | Admitting: Gastroenterology

## 2017-11-19 ENCOUNTER — Ambulatory Visit (INDEPENDENT_AMBULATORY_CARE_PROVIDER_SITE_OTHER): Payer: Medicaid Other | Admitting: Obstetrics and Gynecology

## 2017-11-19 ENCOUNTER — Encounter: Payer: Self-pay | Admitting: Obstetrics and Gynecology

## 2017-11-19 VITALS — BP 143/83 | HR 101 | Wt 252.0 lb

## 2017-11-19 DIAGNOSIS — Z09 Encounter for follow-up examination after completed treatment for conditions other than malignant neoplasm: Secondary | ICD-10-CM

## 2017-11-19 DIAGNOSIS — Z9889 Other specified postprocedural states: Secondary | ICD-10-CM

## 2017-11-19 NOTE — Progress Notes (Signed)
Patient ID: Whitney Bush, female   DOB: 1970-09-07, 46 y.o.   MRN: [id number]     Subjective:  Whitney Bush is a 47 y.o. female now 6 weeks and 3 days status post HYSTERECTOMY SUPRACERVICAL ABDOMINAL.  Here with partner.  Has resumed sex -> painful -> stopped. Cracking of skin that causes bleeding around navel that started a few weeks ago. She says she can't sit up at times due to the pain. Tried being sexually active but had pain on left side and has not resumed sexual activity since. Review of Systems Negative except abdominal pain   Bowel movements : normal.  Pain in abdomen, bleeding and cracking around navel, internal pain when examining cervix  Objective:  BP (!) 143/83 (BP Location: Right Arm, Patient Position: Sitting, Cuff Size: Large)   Pulse (!) 101   Wt 252 lb (114.3 kg)   LMP 09/12/2017 (Exact Date) Comment: still having menses as of 09/27/2017  BMI 44.64 kg/m  General:Well developed, well nourished.  No acute distress. Abdomen: Bowel sounds normal, soft, non-tender. Pelvic Exam: Vagina: normal Cervix: anterior, well supported, inflexible, pt perceives as uncomfortable Uterus: surgically removed  Incision(s):  Healing well, no drainage, no erythema, no hernia, no swelling, no dehiscence,  Assessment:  Post-Op 6 weeks AND 3 days s/p HYSTERECTOMY SUPRACERVICAL ABDOMINAL   dyspareunia due to cervical contact pt to avoid deep forceful penetration x 6 mos Hx alcoholism currently doing better  Plan:  1.Wound care discussed. Leave gauze and band-aid on, take ibuprofen for pain 2. Activity restrictions: no forceful sexual activity 3. Follow up in 1 months for steroid injections  By signing my name below, I, Samul Dada, attest that this documentation has been prepared under the direction and in the presence of Jonnie Kind, MD. Electronically Signed: [place]. 11/19/17. 1:56 PM.  .

## 2017-12-20 ENCOUNTER — Ambulatory Visit: Payer: Medicaid Other

## 2017-12-25 NOTE — Progress Notes (Addendum)
Patient came into the office for a steroid injection.   Dr Glo Herring called to clarify.  States she was to come have steroid injected in scar tissue with Dr Glo Herring.  Pt rescheduled.   Did not see provider on this scheduled day.

## 2017-12-26 LAB — COMPREHENSIVE METABOLIC PANEL
AG Ratio: 0.9 (calc) — ABNORMAL LOW (ref 1.0–2.5)
ALKALINE PHOSPHATASE (APISO): 97 U/L (ref 33–115)
ALT: 29 U/L (ref 6–29)
AST: 51 U/L — AB (ref 10–35)
Albumin: 3.7 g/dL (ref 3.6–5.1)
BUN: 8 mg/dL (ref 7–25)
CO2: 25 mmol/L (ref 20–32)
CREATININE: 0.91 mg/dL (ref 0.50–1.10)
Calcium: 9 mg/dL (ref 8.6–10.2)
Chloride: 106 mmol/L (ref 98–110)
Globulin: 4 g/dL (calc) — ABNORMAL HIGH (ref 1.9–3.7)
Glucose, Bld: 95 mg/dL (ref 65–139)
Potassium: 3.7 mmol/L (ref 3.5–5.3)
SODIUM: 141 mmol/L (ref 135–146)
Total Bilirubin: 0.5 mg/dL (ref 0.2–1.2)
Total Protein: 7.7 g/dL (ref 6.1–8.1)

## 2017-12-26 LAB — CBC WITH DIFFERENTIAL/PLATELET
BASOS PCT: 0.3 %
Basophils Absolute: 21 cells/uL (ref 0–200)
EOS PCT: 1.2 %
Eosinophils Absolute: 83 cells/uL (ref 15–500)
HEMATOCRIT: 34.1 % — AB (ref 35.0–45.0)
HEMOGLOBIN: 10.7 g/dL — AB (ref 11.7–15.5)
LYMPHS ABS: 1842 {cells}/uL (ref 850–3900)
MCH: 25.6 pg — ABNORMAL LOW (ref 27.0–33.0)
MCHC: 31.4 g/dL — ABNORMAL LOW (ref 32.0–36.0)
MCV: 81.6 fL (ref 80.0–100.0)
MPV: 11.8 fL (ref 7.5–12.5)
Monocytes Relative: 8 %
NEUTROS ABS: 4402 {cells}/uL (ref 1500–7800)
NEUTROS PCT: 63.8 %
Platelets: 125 10*3/uL — ABNORMAL LOW (ref 140–400)
RBC: 4.18 10*6/uL (ref 3.80–5.10)
RDW: 16.4 % — AB (ref 11.0–15.0)
Total Lymphocyte: 26.7 %
WBC: 6.9 10*3/uL (ref 3.8–10.8)
WBCMIX: 552 {cells}/uL (ref 200–950)

## 2017-12-26 LAB — PROTIME-INR
INR: 1.1
PROTHROMBIN TIME: 11.2 s (ref 9.0–11.5)

## 2017-12-26 LAB — AFP TUMOR MARKER: AFP TUMOR MARKER: 2.2 ng/mL

## 2018-01-02 ENCOUNTER — Encounter: Payer: Self-pay | Admitting: Obstetrics and Gynecology

## 2018-01-02 ENCOUNTER — Other Ambulatory Visit: Payer: Self-pay

## 2018-01-02 ENCOUNTER — Ambulatory Visit (INDEPENDENT_AMBULATORY_CARE_PROVIDER_SITE_OTHER): Payer: Medicaid Other | Admitting: Obstetrics and Gynecology

## 2018-01-02 VITALS — BP 128/79 | HR 89 | Ht 63.0 in | Wt 245.8 lb

## 2018-01-02 DIAGNOSIS — Z9889 Other specified postprocedural states: Secondary | ICD-10-CM

## 2018-01-02 NOTE — Progress Notes (Signed)
Patient ID: Whitney Bush, female   DOB: 24-Mar-1970, 47 y.o.   MRN: [id number]   [facility] Visit  @DATE @            Patient name: Whitney Bush MRN [id number]  Date of birth: 07/12/1970  CC & HPI:  Whitney Bush is a 47 y.o. female presenting today for Almost 2 months since her hysterectomy and her incision scar has been slow to heal and was considering steroid injection.  Says that she can't "get up and down" like she wants to. There is more tenderness on the right side of the incision. At surgery she was weighing 260 and weight has dropped to 245 and she still would like to lose more weight.  Patient wears really tight clothing around her abdomen which makes the scarring more of a problem.  I have advised her to wear more loose clothing and dresses.  She states that she used to wear a size 8 when she weighed 180 pounds patient expectations seem unrealistic  Consent for release of scar band signed in room before procedure  ROS:  ROS  +right side incision site tenderness -fever -chills All systems are negative except as noted in the HPI and PMH.    Pertinent History Reviewed:   Reviewed: Medical         Past Medical History:  Diagnosis Date  . Alcohol abuse   . 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 Anemia   . Blood transfusion without reported diagnosis   . Boil 06/04/2015  . Chronic kidney disease   . Clotting disorder ([place])   . COPD (chronic obstructive pulmonary disease) ([place])   . Depression   . GERD (gastroesophageal reflux disease)   . GI (gastrointestinal bleed) 07/28/2015  . Heart disease   . Heart murmur   . Hyperlipidemia   . Hypertension   . Hypothyroidism   . Migraines   . OCD (obsessive compulsive disorder)   . Peripheral edema   . PTSD (post-traumatic stress disorder)   . Tubular adenoma                               Surgical Hx:    Past Surgical History:  Procedure Laterality Date  . ABCESS DRAINAGE     x5; neck, arm, chest, back  . BIOPSY  02/24/2015   Procedure: BIOPSY;  Surgeon: Danie Binder, MD;  [facility];  Service: Endoscopy;;  gastric bx's  . BIOPSY  12/25/2016   Procedure: BIOPSY;  Surgeon: Daneil Dolin, MD;  [facility];  Service: Endoscopy;;  gastric  . CESAREAN SECTION    . COLONOSCOPY WITH PROPOFOL N/A 06/28/2015   Dr.Rourk- inadequate prep- One 6 mm polyp at the splenic flexure bx= tubular adenoma  . ESOPHAGEAL BANDING N/A 12/25/2016   Procedure: ESOPHAGEAL BANDING with Propofol;  Surgeon: Daneil Dolin, MD;  [facility];  Service: Endoscopy;  Laterality: N/A;  possible banding of varices  . ESOPHAGOGASTRODUODENOSCOPY (EGD) WITH PROPOFOL N/A 02/24/2015   SLF: Grade II esophageal varices Moderate portal hypertensive gastropathy Mild erosive gastritis anemia likely due to many factors: gastritis, gastropathy, coagulopathy, chronic disease  . ESOPHAGOGASTRODUODENOSCOPY (EGD) WITH PROPOFOL N/A 12/25/2016   Procedure: ESOPHAGOGASTRODUODENOSCOPY (EGD) WITH PROPOFOL;  Surgeon: Daneil Dolin, MD;  [facility];  Service: Endoscopy;  Laterality: N/A;  8:45am  .  MALONEY DILATION N/A 12/25/2016   Procedure: Venia Minks DILATION;  Surgeon: Daneil Dolin, MD;  [facility];  Service: Endoscopy;  Laterality: N/A;  . POLYPECTOMY  06/28/2015   Procedure: POLYPECTOMY;  Surgeon: Daneil Dolin, MD;  [facility];  Service: Endoscopy;;  at splenic flexure  . SUPRACERVICAL ABDOMINAL HYSTERECTOMY N/A 10/02/2017   Procedure: HYSTERECTOMY SUPRACERVICAL ABDOMINAL;  Surgeon: Jonnie Kind, MD;  Location: [facility];  Service: Gynecology;  Laterality: N/A;   Medications: Reviewed & Updated - see associated section                       Current Outpatient Medications:  .  acetaminophen (TYLENOL) 500 MG tablet, Take 500 mg by mouth 3 (three) times daily as needed (for pain.)., Disp: , Rfl:  .  albuterol (PROVENTIL HFA;VENTOLIN HFA) 108  (90 Base) MCG/ACT inhaler, Inhale 1-2 puffs into the lungs every 6 (six) hours as needed for wheezing or shortness of breath., Disp: 1 Inhaler, Rfl: 0 .  B Complex-C (SUPER B COMPLEX PO), Take 1 capsule by mouth daily. , Disp: , Rfl:  .  lactulose (CONSTULOSE) 10 GM/15ML solution, Take 30 mLs (20 g total) by mouth 2 (two) times daily as needed for mild constipation or moderate constipation., Disp: 240 mL, Rfl: 10 .  spironolactone (ALDACTONE) 50 MG tablet, Take 50 mg by mouth daily., Disp: , Rfl:  .  traZODone (DESYREL) 150 MG tablet, Take 150 mg by mouth at bedtime as needed for sleep., Disp: , Rfl:    Social History: Reviewed -  reports that she quit smoking about 7 years ago. Her smoking use included cigarettes. She has a 5.00 pack-year smoking history. She quit smokeless tobacco use about 7 years ago.  Her smokeless tobacco use included snuff.  Objective Findings:  Vitals: Blood pressure 128/79, pulse 89, height 5\' 3"  (1.6 m), weight 245 lb 12.8 oz (111.5 kg), last menstrual period 09/12/2017.  PHYSICAL EXAMINATION General appearance - alert, well appearing, and in no distress, oriented to person, place, and time and overweight Mental status - alert, oriented to person, place, and time, normal mood, behavior, speech, dress, motor activity, and thought processes, affect appropriate to mood Abdomen- peri-umbilical retraction   PELVIC INTERNAL DEFERRED   Release of incision retraction at umbilicus CONSENT was obtained.  Timeout confirmed.  Sterile drape tray had been prepared after local anesthesia was injected under sterile technique and the abdomen was prepped and draped, a inverted V shaped incision was made at the upper aspects of the retracted scar.  The scar was released by distance of approximately 1 cm.  The tip of the V was rotated into the umbilicus intact in place.  The resulting in improved laxity of the umbilical area was further assisted by placing 2 vertical mattress sutures  beneath the skin pulling the underside together in such a way that the periumbilical area was 1 cm more relaxed.  Patient tolerated procedure adequately had pressure dressing applied topical Neosporin underneath it, and was given postop instructions to coat daily with Neosporin keep a dressing over it and follow-up in 2 weeks Assessment & Plan:   A:  1.  peri-umbilical retraction released  P:  1. Performance of release of incision; 1 cm incision made in upper aspects of old scar in V form  2. F/u 2 weeks   By signing my name below, I, Samul Dada, attest that this documentation has been prepared under the direction and in  the presence of Jonnie Kind, MD Electronically Signed: Samul Dada Medical Scribe. 01/02/18. 8:52 AM.  I personally performed the services described in this documentation, which was SCRIBED in my presence. The recorded information has been reviewed and considered accurate. It has been edited as necessary during review. Jonnie Kind, MD

## 2018-01-09 ENCOUNTER — Other Ambulatory Visit: Payer: Self-pay

## 2018-01-09 DIAGNOSIS — K7030 Alcoholic cirrhosis of liver without ascites: Secondary | ICD-10-CM

## 2018-01-16 ENCOUNTER — Telehealth: Payer: Self-pay | Admitting: Obstetrics and Gynecology

## 2018-01-16 ENCOUNTER — Ambulatory Visit: Payer: Medicaid Other | Admitting: Obstetrics and Gynecology

## 2018-01-16 NOTE — Telephone Encounter (Signed)
Phone call to check to see if pt plans to keep appt. , that was at 2:15 PM Pt states that she has no ride, is staying today at aunt's house at [phone], and that incision is healing well, just a little sore, and pt is not going to keep appt.  Pt made aware that if she wishes to be seen, she will need to call for appt.

## 2018-01-17 ENCOUNTER — Ambulatory Visit (HOSPITAL_COMMUNITY): Payer: Medicaid Other

## 2018-01-22 ENCOUNTER — Ambulatory Visit (HOSPITAL_COMMUNITY): Admission: RE | Admit: 2018-01-22 | Payer: Medicaid Other | Source: Ambulatory Visit

## 2018-01-31 ENCOUNTER — Telehealth: Payer: Self-pay | Admitting: Obstetrics and Gynecology

## 2018-01-31 ENCOUNTER — Ambulatory Visit: Payer: Medicaid Other | Admitting: Obstetrics and Gynecology

## 2018-01-31 NOTE — Telephone Encounter (Signed)
Left message for pt to report how the umbilicus is healing., as she has again cancelled appt at the last minute.

## 2018-02-04 ENCOUNTER — Ambulatory Visit (HOSPITAL_COMMUNITY): Payer: Medicaid Other

## 2018-02-06 ENCOUNTER — Ambulatory Visit (HOSPITAL_COMMUNITY)
Admission: RE | Admit: 2018-02-06 | Discharge: 2018-02-06 | Disposition: A | Discharge status: Home / Self Care | Payer: Medicaid Other | Source: Ambulatory Visit | Attending: Gastroenterology | Admitting: Gastroenterology

## 2018-02-06 DIAGNOSIS — K8020 Calculus of gallbladder without cholecystitis without obstruction: Secondary | ICD-10-CM | POA: Diagnosis not present

## 2018-02-06 DIAGNOSIS — K7030 Alcoholic cirrhosis of liver without ascites: Secondary | ICD-10-CM

## 2018-02-06 DIAGNOSIS — K7689 Other specified diseases of liver: Secondary | ICD-10-CM | POA: Insufficient documentation

## 2018-02-26 ENCOUNTER — Other Ambulatory Visit: Payer: Self-pay | Admitting: Gastroenterology

## 2018-02-26 ENCOUNTER — Other Ambulatory Visit: Payer: Self-pay

## 2018-02-26 DIAGNOSIS — K7030 Alcoholic cirrhosis of liver without ascites: Secondary | ICD-10-CM

## 2018-02-26 MED ORDER — FUROSEMIDE 40 MG PO TABS: 40.0000 mg | ORAL_TABLET | Freq: Every day | ORAL | 3 refills | Status: DC

## 2018-02-26 MED ORDER — SPIRONOLACTONE 50 MG PO TABS: 50.0000 mg | ORAL_TABLET | Freq: Every day | ORAL | 3 refills | Status: DC

## 2018-03-19 ENCOUNTER — Encounter: Payer: Self-pay | Admitting: Gastroenterology

## 2018-03-19 ENCOUNTER — Ambulatory Visit: Payer: Medicaid Other | Admitting: Gastroenterology

## 2018-03-19 ENCOUNTER — Other Ambulatory Visit: Payer: Self-pay

## 2018-03-19 VITALS — BP 119/82 | HR 90 | Temp 96.7°F | Ht 63.0 in | Wt 240.8 lb

## 2018-03-19 DIAGNOSIS — D500 Iron deficiency anemia secondary to blood loss (chronic): Secondary | ICD-10-CM

## 2018-03-19 DIAGNOSIS — K7030 Alcoholic cirrhosis of liver without ascites: Secondary | ICD-10-CM

## 2018-03-19 DIAGNOSIS — I8510 Secondary esophageal varices without bleeding: Secondary | ICD-10-CM

## 2018-03-19 DIAGNOSIS — R1012 Left upper quadrant pain: Secondary | ICD-10-CM | POA: Diagnosis not present

## 2018-03-19 DIAGNOSIS — Z86010 Personal history of colonic polyps: Secondary | ICD-10-CM

## 2018-03-19 MED ORDER — LACTULOSE 10 GM/15ML PO SOLN: 20.0000 g | Freq: Two times a day (BID) | ORAL | 10 refills | Status: DC | PRN

## 2018-03-19 MED ORDER — FUROSEMIDE 40 MG PO TABS: 40.0000 mg | ORAL_TABLET | Freq: Every day | ORAL | 3 refills | Status: DC

## 2018-03-19 MED ORDER — ALBUTEROL SULFATE HFA 108 (90 BASE) MCG/ACT IN AERS: 1.0000 | INHALATION_SPRAY | Freq: Four times a day (QID) | RESPIRATORY_TRACT | 0 refills | Status: DC | PRN

## 2018-03-19 MED ORDER — PROPRANOLOL HCL 10 MG PO TABS: 10.0000 mg | ORAL_TABLET | Freq: Three times a day (TID) | ORAL | 3 refills | Status: DC

## 2018-03-19 MED ORDER — SPIRONOLACTONE 50 MG PO TABS: 50.0000 mg | ORAL_TABLET | Freq: Every day | ORAL | 3 refills | Status: DC

## 2018-03-19 NOTE — Patient Instructions (Signed)
1. Please discuss with your PCP regarding possible sleep apnea testing.  2. I have sent in prescriptions for lactulose, propanolol, Aldactone, furosemide, albuterol inhaler. 3. Please go for ultrasound as scheduled, while you are at the hospital have your blood work done. 4. Return to the office in 6 months.

## 2018-03-19 NOTE — Assessment & Plan Note (Signed)
Cirrhosis secondary to alcohol abuse.  Patient has not been seen in nearly 1 year.  She states that she abstain from alcohol use from August current except for 1 day.  She felt bad afterwards and developed significant vomiting.  Previously declined [facility] liver evaluation on multiple occasions, not interested in pursuing liver transplant at this time.  She is up-to-date on labs and ultrasound with good hepatic function.  She has a history of significant iron deficiency anemia suspected to be due to vaginal blood loss, status post hysterectomy in August.  She does have a history of colonic adenomas and poor bowel prep on previous colonoscopy in 2017.  She is up-to-date on EGD.  She has known varices.  -She needs to resume propanolol 10 mg 3 times daily. -Continue to avoid alcohol. -We will update labs regarding anemia.  If persistent IDA she will need to have a colonoscopy. -She has left upper quadrant discomfort and given her cirrhosis she may have developed splenomegaly to contribute to the symptoms.  Would advise limited ultrasound to evaluate for splenomegaly. -Increase lactulose to daily use to maintain regular bowel movements. -I have refilled her albuterol inhaler until she has time to see her PCP, Lasix, Aldactone, lactulose, propanolol. -She was seen by PCP and discussed sleep apnea testing. -Return to the office in 6 months or sooner if needed.

## 2018-03-19 NOTE — Progress Notes (Signed)
Primary Care Physician: Tollie Eth, NP  Primary Gastroenterologist:  Garfield Cornea, MD   Chief Complaint  Patient presents with  . Cirrhosis    f/u. C/o upper abd swelling x 1 month and started in her ankles    HPI: Whitney Bush is a 48 y.o. female here for follow-up.  Last seen in March 2019.  She has a history of cirrhosis related to alcohol use.  EGD in October 2018 showing 2 columns of grade 2 esophageal varices, 1 column of grade 3.  Mild portal hypertensive gastropathy, biopsy showing reactive gastropathy but no H. pylori.  Esophagus dilated to 33 [place].  Last colonoscopy in May 2017, inadequate bowel prep.  One 6 mm polyp at the splenic flexure removed, tubular adenoma.  Recommended 5-year follow-up colonoscopy.  Was on propanolol 10 mg 3 times daily but she was taking only once daily because she cannot remember the other doses.  Currently does not appear to be on her list.  Since we last saw her she underwent a hysterectomy back in August.  Labs done back in October.  MELD 7.  Slight improvement of hemoglobin.  Hemoglobin went from 10.5-10.7.  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  Weight is down about 5 pounds since November.  Down 10 pounds since August.  Patient called recently for refill on her fluid pills.  She had noted lower extremity edema.  She states she dropped about 15 pounds since restarting her medications.  She has a bowel movement about twice per week after taking lactulose twice per week.  Denies melena or rectal bleeding.  She complains of fullness and pressure in the left upper quadrant as if something is pushing up into her chest.  This is been occurring for several weeks or more.  Not necessarily related to meals or bowel movements.  No associated nausea, vomiting, fever.  Last imaging of her spleen was in April 2018 was normal in size but given history of cirrhosis cannot exclude  development of splenomegaly contributing to her symptoms.  Patient also complains of poor sleep.  Reports that her daughter states she snores and stops breathing.  Has never had a sleep study.  She is also out of her inhaler.  She reports that she abstain from alcohol from August until now with the exception of drinking once around Christmas.  Current Outpatient Medications  Medication Sig Dispense Refill  . acetaminophen (TYLENOL) 500 MG tablet Take 500 mg by mouth 3 (three) times daily as needed (for pain.).    Marland Kitchen B Complex-C (SUPER B COMPLEX PO) Take 1 capsule by mouth daily.     Marland Kitchen FLUoxetine (PROZAC) 20 MG capsule Take 20 mg by mouth daily.    . furosemide (LASIX) 40 MG tablet Take 1 tablet (40 mg total) by mouth daily. 30 tablet 3  . spironolactone (ALDACTONE) 50 MG tablet Take 1 tablet (50 mg total) by mouth daily. 30 tablet 3  . lactulose (CONSTULOSE) 10 GM/15ML solution Take 30 mLs (20 g total) by mouth 2 (two) times daily as needed for mild constipation or moderate constipation. (Patient not taking: Reported on 03/19/2018) 240 mL 10   No current facility-administered medications for this visit.     Allergies as of 03/19/2018  . (No Known Allergies)   Past Medical History:  Diagnosis Date  . Alcohol abuse   . Alcoholic cirrhosis of liver (HCC)    immune to Hep A and Hep  B  . Anemia   . Blood transfusion without reported diagnosis   . Boil 06/04/2015  . Chronic kidney disease   . Clotting disorder ([place])   . COPD (chronic obstructive pulmonary disease) ([place])   . Depression   . GERD (gastroesophageal reflux disease)   . GI (gastrointestinal bleed) 07/28/2015  . Heart disease   . Heart murmur   . Hyperlipidemia   . Hypertension   . Hypothyroidism   . Migraines   . OCD (obsessive compulsive disorder)   . Peripheral edema   . PTSD (post-traumatic stress disorder)   . Tubular adenoma    Past Surgical History:  Procedure Laterality Date  . ABCESS DRAINAGE     x5; neck,  arm, chest, back  . BIOPSY  02/24/2015   Procedure: BIOPSY;  Surgeon: Danie Binder, MD;  [facility];  Service: Endoscopy;;  gastric bx's  . BIOPSY  12/25/2016   Procedure: BIOPSY;  Surgeon: Daneil Dolin, MD;  [facility];  Service: Endoscopy;;  gastric  . CESAREAN SECTION    . COLONOSCOPY WITH PROPOFOL N/A 06/28/2015   Dr.Rourk- inadequate prep- One 6 mm polyp at the splenic flexure bx= tubular adenoma  . ESOPHAGEAL BANDING N/A 12/25/2016   Procedure: ESOPHAGEAL BANDING with Propofol;  Surgeon: Daneil Dolin, MD;  [facility];  Service: Endoscopy;  Laterality: N/A;  possible banding of varices  . ESOPHAGOGASTRODUODENOSCOPY (EGD) WITH PROPOFOL N/A 02/24/2015   SLF: Grade II esophageal varices Moderate portal hypertensive gastropathy Mild erosive gastritis anemia likely due to many factors: gastritis, gastropathy, coagulopathy, chronic disease  . ESOPHAGOGASTRODUODENOSCOPY (EGD) WITH PROPOFOL N/A 12/25/2016   Procedure: ESOPHAGOGASTRODUODENOSCOPY (EGD) WITH PROPOFOL;  Surgeon: Daneil Dolin, MD;  [facility];  Service: Endoscopy;  Laterality: N/A;  8:45am  . MALONEY DILATION N/A 12/25/2016   Procedure: Venia Minks DILATION;  Surgeon: Daneil Dolin, MD;  [facility];  Service: Endoscopy;  Laterality: N/A;  . POLYPECTOMY  06/28/2015   Procedure: POLYPECTOMY;  Surgeon: Daneil Dolin, MD;  [facility];  Service: Endoscopy;;  at splenic flexure  . SUPRACERVICAL ABDOMINAL HYSTERECTOMY N/A 10/02/2017   Procedure: HYSTERECTOMY SUPRACERVICAL ABDOMINAL;  Surgeon: Jonnie Kind, MD;  Location: [facility];  Service: Gynecology;  Laterality: N/A;    ROS:  General: Negative for anorexia, unintentional weight loss, fever, chills, fatigue, weakness. ENT: Negative for hoarseness, difficulty swallowing , nasal congestion. CV: Negative for chest pain, angina, palpitations, dyspnea on exertion, peripheral edema.  Respiratory: Negative for  dyspnea at rest, dyspnea on exertion, cough, sputum, wheezing.  GI: See history of present illness. GU:  Negative for dysuria, hematuria, urinary incontinence, urinary frequency, nocturnal urination.  Endo: Negative for unusual weight change.    Physical Examination:   BP 119/82   Pulse 90   Temp (!) 96.7 F (35.9 C) (Oral)   Ht 5\' 3"  (1.6 m)   Wt 240 lb 12.8 oz (109.2 kg)   LMP 09/12/2017 (Exact Date) Comment:    BMI 42.66 kg/m   General: Well-nourished, well-developed in no acute distress.  Eyes: No icterus. Mouth: Oropharyngeal mucosa moist and pink , no lesions erythema or exudate. Lungs: Clear to auscultation bilaterally.  Heart: Regular rate and rhythm, no murmurs rubs or gallops.  Abdomen: Bowel sounds are normal, mild left upper quadrant tenderness, nondistended, no hepatosplenomegaly or masses, no abdominal bruits or hernia , no rebound or guarding.  Cannot appreciate splenomegaly but limited due to body habitus. Extremities:  Trace lower extremity edema. No clubbing or deformities. Neuro: Alert and oriented x 4   Skin: Warm and dry, no jaundice.   Psych: Alert and cooperative, normal mood and affect.  Labs:  Lab Results  Component Value Date   CREATININE 0.91 12/25/2017   BUN 8 12/25/2017   NA 141 12/25/2017   K 3.7 12/25/2017   CL 106 12/25/2017   CO2 25 12/25/2017   Lab Results  Component Value Date   ALT 29 12/25/2017   AST 51 (H) 12/25/2017   ALKPHOS 97 12/25/2017   BILITOT 0.5 12/25/2017   Lab Results  Component Value Date   WBC 6.9 12/25/2017   HGB 10.7 (L) 12/25/2017   HCT 34.1 (L) 12/25/2017   MCV 81.6 12/25/2017   PLT 125 (L) 12/25/2017   AFP 2.2 Lab Results  Component Value Date   INR 1.1 12/25/2017   INR 1.13 07/18/2017   INR 1.16 06/03/2017    Imaging Studies:  Right upper quadrant ultrasound December 2019 showed cirrhosis, stable septated left lobe cyst measuring 1.4 cm.  No ascites.  Gallstones but no acute cholecystitis.

## 2018-03-20 NOTE — Patient Instructions (Signed)
PA for US Spleen submitted via [employer]. Case approved. PA# [id number], 03/20/18-04/19/18.

## 2018-03-20 NOTE — Progress Notes (Signed)
cc'ed to pcp °

## 2018-03-29 ENCOUNTER — Ambulatory Visit (HOSPITAL_COMMUNITY)
Admission: RE | Admit: 2018-03-29 | Discharge: 2018-03-29 | Disposition: A | Discharge status: Home / Self Care | Payer: Medicaid Other | Source: Ambulatory Visit | Attending: Gastroenterology | Admitting: Gastroenterology

## 2018-03-29 DIAGNOSIS — K7030 Alcoholic cirrhosis of liver without ascites: Secondary | ICD-10-CM | POA: Insufficient documentation

## 2018-03-29 DIAGNOSIS — R1012 Left upper quadrant pain: Secondary | ICD-10-CM | POA: Diagnosis present

## 2018-04-11 LAB — CBC WITH DIFFERENTIAL/PLATELET
Absolute Monocytes: 821 {cells}/uL (ref 200–950)
Basophils Absolute: 71 {cells}/uL (ref 0–200)
Basophils Relative: 0.6 %
Eosinophils Absolute: 107 {cells}/uL (ref 15–500)
Eosinophils Relative: 0.9 %
HCT: 40.9 % (ref 35.0–45.0)
Hemoglobin: 13.6 g/dL (ref 11.7–15.5)
Lymphs Abs: 2808 {cells}/uL (ref 850–3900)
MCH: 27.7 pg (ref 27.0–33.0)
MCHC: 33.3 g/dL (ref 32.0–36.0)
MCV: 83.3 fL (ref 80.0–100.0)
MPV: 10.4 fL (ref 7.5–12.5)
Monocytes Relative: 6.9 %
Neutro Abs: 8092 {cells}/uL — ABNORMAL HIGH (ref 1500–7800)
Neutrophils Relative %: 68 %
Platelets: 197 10*3/uL (ref 140–400)
RBC: 4.91 Million/uL (ref 3.80–5.10)
RDW: 15.6 % — ABNORMAL HIGH (ref 11.0–15.0)
Total Lymphocyte: 23.6 %
WBC: 11.9 10*3/uL — ABNORMAL HIGH (ref 3.8–10.8)

## 2018-04-11 LAB — IRON,TIBC AND FERRITIN PANEL
%SAT: 32 % (ref 16–45)
Ferritin: 70 ng/mL (ref 16–232)
Iron: 124 ug/dL (ref 40–190)
TIBC: 388 ug/dL (ref 250–450)

## 2018-04-29 NOTE — Telephone Encounter (Signed)
Note sent to nurse. 

## 2018-05-28 ENCOUNTER — Telehealth: Payer: Self-pay | Admitting: Obstetrics and Gynecology

## 2018-05-28 NOTE — Telephone Encounter (Signed)
Patient called stating that she would like a refill of her medication. Pt states that if he doesn't want to give it to her to call [facility] and have them refill it. Pt states that they have done it before. Pt states that someone needs to give her a refill of her medication and she does not care about this COVID-19. Pt states that she lives by herself and she will not get it from no one, this was the last thing she stated before she hung up the telephone on me. Please contact pt

## 2018-05-28 NOTE — Telephone Encounter (Signed)
Whitney Bush calls with 3 concerns: 1.  She wants refill on her trazodone, pt advised to go back to primary care provider. 2.  Why does sex still hurt. On left.  Pt has a partner who is " huge, " his stuff.. 3. Wants to know if her stuff is normal, or "been messed up" . Pt advised that after Covid 19 is resolved , if the relationship continues and pain persists, followup exam can be done to assess.

## 2018-05-28 NOTE — Telephone Encounter (Signed)
Patient stated she had a hysterectomy three months ago, she is having leg and abdominal pain.  Stated she can't have intercourse because she's in so much pain.  She only wants to speak to Dr. Glo Herring and wants to talk to him before 5pm today.  [phone] or [phone]

## 2018-06-21 ENCOUNTER — Telehealth: Payer: Self-pay | Admitting: Internal Medicine

## 2018-06-21 NOTE — Telephone Encounter (Signed)
Spoke with pts mother. Pt isn't at home and was asked to call back. Pt's mother is aware that our office closes at 12 PM today and if pt is in severe pain, she should go to the ED for evaluation.

## 2018-06-21 NOTE — Telephone Encounter (Signed)
PATIENT CALLED AND SAID SHE IS Whitney Bush A BAD SIDE PAIN, THINKS ITS HER SPLEEN  [phone] OR [phone]

## 2018-07-23 ENCOUNTER — Other Ambulatory Visit: Payer: Self-pay

## 2018-07-23 DIAGNOSIS — K7030 Alcoholic cirrhosis of liver without ascites: Secondary | ICD-10-CM

## 2018-08-28 LAB — CBC WITH DIFFERENTIAL/PLATELET
Absolute Monocytes: 548 cells/uL (ref 200–950)
Basophils Absolute: 26 cells/uL (ref 0–200)
Basophils Relative: 0.3 %
Eosinophils Absolute: 78 cells/uL (ref 15–500)
Eosinophils Relative: 0.9 %
HCT: 38.8 % (ref 35.0–45.0)
Hemoglobin: 13.1 g/dL (ref 11.7–15.5)
Lymphs Abs: 1914 cells/uL (ref 850–3900)
MCH: 30.3 pg (ref 27.0–33.0)
MCHC: 33.8 g/dL (ref 32.0–36.0)
MCV: 89.6 fL (ref 80.0–100.0)
MPV: 11 fL (ref 7.5–12.5)
Monocytes Relative: 6.3 %
Neutro Abs: 6134 cells/uL (ref 1500–7800)
Neutrophils Relative %: 70.5 %
Platelets: 125 10*3/uL — ABNORMAL LOW (ref 140–400)
RBC: 4.33 10*6/uL (ref 3.80–5.10)
RDW: 14.6 % (ref 11.0–15.0)
Total Lymphocyte: 22 %
WBC: 8.7 10*3/uL (ref 3.8–10.8)

## 2018-08-28 LAB — BASIC METABOLIC PANEL
BUN: 8 mg/dL (ref 7–25)
CO2: 28 mmol/L (ref 20–32)
Calcium: 9.5 mg/dL (ref 8.6–10.2)
Chloride: 101 mmol/L (ref 98–110)
Creat: 0.88 mg/dL (ref 0.50–1.10)
Glucose, Bld: 92 mg/dL (ref 65–99)
Potassium: 4 mmol/L (ref 3.5–5.3)
Sodium: 137 mmol/L (ref 135–146)

## 2018-08-28 LAB — PROTIME-INR
INR: 1.1
Prothrombin Time: 11.1 s (ref 9.0–11.5)

## 2018-08-28 LAB — AFP TUMOR MARKER: AFP-Tumor Marker: 2.8 ng/mL

## 2018-09-02 ENCOUNTER — Encounter (HOSPITAL_COMMUNITY): Payer: Self-pay | Admitting: Emergency Medicine

## 2018-09-02 ENCOUNTER — Emergency Department (HOSPITAL_COMMUNITY)
Admission: EM | Admit: 2018-09-02 | Discharge: 2018-09-02 | Disposition: A | Discharge status: Home / Self Care | Payer: Medicaid Other | Attending: Emergency Medicine | Admitting: Emergency Medicine

## 2018-09-02 ENCOUNTER — Other Ambulatory Visit: Payer: Self-pay

## 2018-09-02 ENCOUNTER — Emergency Department (HOSPITAL_COMMUNITY): Payer: Medicaid Other

## 2018-09-02 DIAGNOSIS — S8992XA Unspecified injury of left lower leg, initial encounter: Secondary | ICD-10-CM | POA: Diagnosis not present

## 2018-09-02 DIAGNOSIS — Y9289 Other specified places as the place of occurrence of the external cause: Secondary | ICD-10-CM | POA: Insufficient documentation

## 2018-09-02 DIAGNOSIS — N189 Chronic kidney disease, unspecified: Secondary | ICD-10-CM | POA: Diagnosis not present

## 2018-09-02 DIAGNOSIS — Y999 Unspecified external cause status: Secondary | ICD-10-CM | POA: Insufficient documentation

## 2018-09-02 DIAGNOSIS — J449 Chronic obstructive pulmonary disease, unspecified: Secondary | ICD-10-CM | POA: Insufficient documentation

## 2018-09-02 DIAGNOSIS — Z79899 Other long term (current) drug therapy: Secondary | ICD-10-CM | POA: Insufficient documentation

## 2018-09-02 DIAGNOSIS — I129 Hypertensive chronic kidney disease with stage 1 through stage 4 chronic kidney disease, or unspecified chronic kidney disease: Secondary | ICD-10-CM | POA: Insufficient documentation

## 2018-09-02 DIAGNOSIS — E039 Hypothyroidism, unspecified: Secondary | ICD-10-CM | POA: Insufficient documentation

## 2018-09-02 DIAGNOSIS — S20212A Contusion of left front wall of thorax, initial encounter: Secondary | ICD-10-CM | POA: Insufficient documentation

## 2018-09-02 DIAGNOSIS — Z87891 Personal history of nicotine dependence: Secondary | ICD-10-CM | POA: Diagnosis not present

## 2018-09-02 DIAGNOSIS — S8990XA Unspecified injury of unspecified lower leg, initial encounter: Secondary | ICD-10-CM

## 2018-09-02 DIAGNOSIS — L02211 Cutaneous abscess of abdominal wall: Secondary | ICD-10-CM | POA: Diagnosis not present

## 2018-09-02 DIAGNOSIS — L0291 Cutaneous abscess, unspecified: Secondary | ICD-10-CM

## 2018-09-02 DIAGNOSIS — Y9389 Activity, other specified: Secondary | ICD-10-CM | POA: Diagnosis not present

## 2018-09-02 DIAGNOSIS — S299XXA Unspecified injury of thorax, initial encounter: Secondary | ICD-10-CM | POA: Diagnosis present

## 2018-09-02 MED ORDER — DOXYCYCLINE HYCLATE 100 MG PO CAPS: 100.0000 mg | ORAL_CAPSULE | Freq: Two times a day (BID) | ORAL | 0 refills | Status: DC

## 2018-09-02 MED ORDER — LIDOCAINE-EPINEPHRINE (PF) 2 %-1:200000 IJ SOLN
INTRAMUSCULAR | Status: AC
  Filled 2018-09-02: qty 20

## 2018-09-02 MED ORDER — HYDROCODONE-ACETAMINOPHEN 5-325 MG PO TABS: ORAL_TABLET | ORAL | 0 refills | Status: DC

## 2018-09-02 MED ORDER — POVIDONE-IODINE 10 % EX SOLN
CUTANEOUS | Status: DC | PRN
  Administered 2018-09-02: 12:00:00 via TOPICAL
  Filled 2018-09-02: qty 15

## 2018-09-02 MED ORDER — LIDOCAINE HCL (PF) 2 % IJ SOLN: 5.0000 mL | Freq: Once | INTRAMUSCULAR | Status: DC

## 2018-09-02 MED ORDER — OXYCODONE-ACETAMINOPHEN 5-325 MG PO TABS
1.0000 | ORAL_TABLET | Freq: Once | ORAL | Status: AC
  Administered 2018-09-02: 1 via ORAL
  Filled 2018-09-02: qty 1

## 2018-09-02 NOTE — Discharge Instructions (Addendum)
Warm water soaks or compresses 2-3 times a day to the abscess.  Take the antibiotic as directed until its finished.  Use the crutches as needed for weightbearing.  Call Dr. Ruthe Mannan office to arrange a follow-up appointment regarding your knee pain.  Apply ice packs on and off to your knee.  You also need to use a pillow or folded towel held firmly to your side to cough and take deep breaths several times a day.  Follow-up with your primary doctor regarding your rib pain.
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
## 2018-09-02 NOTE — ED Notes (Signed)
Pt taken to xray 
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
## 2018-09-04 ENCOUNTER — Other Ambulatory Visit: Payer: Self-pay

## 2018-09-04 DIAGNOSIS — K7030 Alcoholic cirrhosis of liver without ascites: Secondary | ICD-10-CM

## 2018-09-04 DIAGNOSIS — K7031 Alcoholic cirrhosis of liver with ascites: Secondary | ICD-10-CM

## 2018-09-06 ENCOUNTER — Other Ambulatory Visit: Payer: Self-pay

## 2018-09-09 ENCOUNTER — Telehealth: Payer: Self-pay | Admitting: Orthopedic Surgery

## 2018-09-09 NOTE — Telephone Encounter (Addendum)
Patient called and stated that she was seen in the ER and referred by them to see Dr Aline Brochure.  She said she has left knee pain.  I told her that in light of her going to the ER and not her PCP, I would need to get a referral from her PCP for her to be seen here.  She understood.  I called [facility] At [phone]  and left a message for Melody to call me back.  She did call back and stated that the provider that just saw Scher[place] was not in the office today but would be there tomorrow.  Melody said she would have them give the authorization for Latiqua to be seen here.  Melody is to call me with referral.  I called and explained this to [facility] and she understood.

## 2018-09-16 ENCOUNTER — Other Ambulatory Visit: Payer: Self-pay

## 2018-09-16 ENCOUNTER — Telehealth: Payer: Self-pay | Admitting: Orthopaedic Surgery

## 2018-09-16 DIAGNOSIS — K7031 Alcoholic cirrhosis of liver with ascites: Secondary | ICD-10-CM

## 2018-09-16 NOTE — Telephone Encounter (Signed)
After speaking with Whitney Bush from [facility] several times, I finally got the correct referral on Whitney Bush Friday afternoon, 09/13/18 after we had already left.  I called the patient's home phone number and cell phone number this morning to schedule an appointment here in our office.  I did leave a message on her cell phone but there was no voicemail for the house phone.  I will wait for the patient to return my call.

## 2018-09-17 ENCOUNTER — Telehealth: Payer: Self-pay | Admitting: Internal Medicine

## 2018-09-17 ENCOUNTER — Encounter: Payer: Self-pay | Admitting: Internal Medicine

## 2018-09-17 ENCOUNTER — Ambulatory Visit: Payer: Medicaid Other | Admitting: Gastroenterology

## 2018-09-17 NOTE — Telephone Encounter (Signed)
PATIENT WAS A NO SHOW AND LETTER SENT  °

## 2018-09-19 ENCOUNTER — Encounter: Payer: Self-pay | Admitting: Orthopaedic Surgery

## 2018-09-19 ENCOUNTER — Other Ambulatory Visit: Payer: Self-pay

## 2018-09-19 ENCOUNTER — Ambulatory Visit: Payer: Medicaid Other | Admitting: Orthopaedic Surgery

## 2018-09-19 VITALS — BP 159/97 | HR 85 | Temp 98.0°F | Ht 63.0 in | Wt 239.1 lb

## 2018-09-19 DIAGNOSIS — Z6841 Body Mass Index (BMI) 40.0 and over, adult: Secondary | ICD-10-CM | POA: Diagnosis not present

## 2018-09-19 DIAGNOSIS — M25562 Pain in left knee: Secondary | ICD-10-CM | POA: Diagnosis not present

## 2018-09-19 NOTE — Progress Notes (Signed)
Subjective:    Patient ID: Whitney Bush, female    DOB: [birth year], 48 y.o.   MRN: [id number]  HPI She has had pain in the right knee since the first of the month.  She has swelling, popping and giving way.  She has no trauma. She went to the ER 09-02-2018.  X-rays were negative.  She went to her primary care 09-06-2018.  I have reviewed all the notes from both and the x-rays and reports.  She has no redness, no distal edema.  She is not getting any better. She says nothing helps.     Review of Systems  Constitutional: Positive for activity change.  Musculoskeletal: Positive for arthralgias, gait problem and joint swelling.  Neurological: Positive for headaches.  All other systems reviewed and are negative.  For Review of Systems, all other systems reviewed and are negative.  The following is a summary of the past history medically, past history surgically, known current medicines, social history and family history.  This information is gathered electronically by the computer from prior information and documentation.  I review this each visit and have found including this information at this point in the chart is beneficial and informative.   Past Medical History:  Diagnosis Date  . Alcohol abuse   . 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 Anemia   . Blood transfusion without reported diagnosis   . Boil 06/04/2015  . Chronic kidney disease   . Clotting disorder ([place])   . COPD (chronic obstructive pulmonary disease) ([place])   . Depression   . GERD (gastroesophageal reflux disease)   . GI (gastrointestinal bleed) 07/28/2015  . Heart disease   . Heart murmur   . Hyperlipidemia   . Hypertension   . Hypothyroidism   . Migraines   . OCD (obsessive compulsive disorder)   . Peripheral edema   . PTSD (post-traumatic stress disorder)   . Tubular adenoma     Past Surgical History:  Procedure Laterality Date  . ABCESS DRAINAGE     x5; neck, arm, chest,  back  . BIOPSY  02/24/2015   Procedure: BIOPSY;  Surgeon: Danie Binder, MD;  [facility];  Service: Endoscopy;;  gastric bx's  . BIOPSY  12/25/2016   Procedure: BIOPSY;  Surgeon: Daneil Dolin, MD;  [facility]: [facility];  Service: Endoscopy;;  gastric  . CESAREAN SECTION    . COLONOSCOPY WITH PROPOFOL N/A 06/28/2015   Dr.Rourk- inadequate prep- One 6 mm polyp at the splenic flexure bx= tubular adenoma  . ESOPHAGEAL BANDING N/A 12/25/2016   Procedure: ESOPHAGEAL BANDING with Propofol;  Surgeon: Daneil Dolin, MD;  [facility]: [facility];  Service: Endoscopy;  Laterality: N/A;  possible banding of varices  . ESOPHAGOGASTRODUODENOSCOPY (EGD) WITH PROPOFOL N/A 02/24/2015   SLF: Grade II esophageal varices Moderate portal hypertensive gastropathy Mild erosive gastritis anemia likely due to many factors: gastritis, gastropathy, coagulopathy, chronic disease  . ESOPHAGOGASTRODUODENOSCOPY (EGD) WITH PROPOFOL N/A 12/25/2016   Procedure: ESOPHAGOGASTRODUODENOSCOPY (EGD) WITH PROPOFOL;  Surgeon: Daneil Dolin, MD;  [facility];  Service: Endoscopy;  Laterality: N/A;  8:45am  . MALONEY DILATION N/A 12/25/2016   Procedure: Venia Minks DILATION;  Surgeon: Daneil Dolin, MD;  [facility]: [facility];  Service: Endoscopy;  Laterality: N/A;  . POLYPECTOMY  06/28/2015   Procedure: POLYPECTOMY;  Surgeon: Daneil Dolin, MD;  [facility];  Service: Endoscopy;;  at splenic flexure  . SUPRACERVICAL ABDOMINAL HYSTERECTOMY N/A 10/02/2017   Procedure: HYSTERECTOMY SUPRACERVICAL ABDOMINAL;  Surgeon: Jonnie Kind, MD;  Location: [facility];  Service: Gynecology;  Laterality: N/A;    Current Outpatient Medications on File Prior to Visit  Medication Sig Dispense Refill  . acetaminophen (TYLENOL) 500 MG tablet Take 500 mg by mouth 3 (three) times daily as needed (for pain.).    Marland Kitchen albuterol (PROVENTIL HFA;VENTOLIN HFA) 108 (90 Base) MCG/ACT inhaler Inhale 1-2 puffs into the lungs  every 6 (six) hours as needed for wheezing or shortness of breath. 1 Inhaler 0  . B Complex-C (SUPER B COMPLEX PO) Take 1 capsule by mouth daily.     Marland Kitchen doxycycline (VIBRAMYCIN) 100 MG capsule Take 1 capsule (100 mg total) by mouth 2 (two) times daily. 20 capsule 0  . FLUoxetine (PROZAC) 20 MG capsule Take 20 mg by mouth daily.    . furosemide (LASIX) 40 MG tablet Take 1 tablet (40 mg total) by mouth daily. 30 tablet 3  . HYDROcodone-acetaminophen (NORCO/VICODIN) 5-325 MG tablet Take one tab po q 4 hrs prn pain 8 tablet 0  . lactulose (CONSTULOSE) 10 GM/15ML solution Take 30 mLs (20 g total) by mouth 2 (two) times daily as needed for mild constipation or moderate constipation. 946 mL 10  . propranolol (INDERAL) 10 MG tablet Take 1 tablet (10 mg total) by mouth 3 (three) times daily. 90 tablet 3  . spironolactone (ALDACTONE) 50 MG tablet Take 1 tablet (50 mg total) by mouth daily. 30 tablet 3   No current facility-administered medications on file prior to visit.     Social History   Socioeconomic History  . Marital status: Widowed    Spouse name: Not on file  . Number of children: 1  . Years of education: 44  . Highest education level: Not on file  Occupational History  . Occupation: unemployed  Social Needs  . Financial resource strain: Not on file  . Food insecurity    Worry: Not on file    Inability: Not on file  . Transportation needs    Medical: Not on file    Non-medical: Not on file  Tobacco Use  . Smoking status: Former Smoker    Packs/day: 1.00    Years: 5.00    Pack years: 5.00    Types: Cigarettes    Quit date: 12/29/2010    Years since quitting: 7.7  . Smokeless tobacco: Former Systems developer    Types: Snuff    Quit date: 09/28/2010  Substance and Sexual Activity  . Alcohol use: Yes    Comment: weekends  . Drug use: No  . Sexual activity: Yes    Birth control/protection: Surgical  Lifestyle  . Physical activity    Days per week: Not on file    Minutes per session: Not  on file  . Stress: Not on file  Relationships  . Social Herbalist on phone: Not on file    Gets together: Not on file    Attends religious service: Not on file    Active member of club or organization: Not on file    Attends meetings of clubs or organizations: Not on file    Relationship status: Not on file  . Intimate partner violence    Fear of current or ex partner: Not on file    Emotionally abused: Not on file    Physically abused: Not on file    Forced sexual activity: Not  on file  Other Topics Concern  . Not on file  Social History Narrative   Lives with parents    Family History  Problem Relation Age of Onset  . Diabetes Father   . Hyperlipidemia Father   . Alcohol abuse Father   . Hypertension Father   . Cancer Other   . Cervical cancer Maternal Grandmother   . Lung cancer Maternal Grandfather   . Alcohol abuse Other        multiple family members  . Diabetes Sister   . Hypertension Sister   . Hypertension Brother   . Colon cancer Neg Hx   . Liver disease Neg Hx     BP (!) 159/97   Pulse 85   Temp 98 F (36.7 C)   Ht 5\' 3"  (1.6 m)   Wt 239 lb 2 oz (108.5 kg)   LMP 09/12/2017 (Exact Date) Comment:    BMI 42.36 kg/m   Body mass index is 42.36 kg/m.  The patient meets the AMA guidelines for Morbid (severe) obesity with a BMI > 40.0 and I have recommended weight loss.      Objective:   Physical Exam Vitals signs reviewed.  Constitutional:      Appearance: She is well-developed.  HENT:     Head: Normocephalic and atraumatic.  Eyes:     Conjunctiva/sclera: Conjunctivae normal.     Pupils: Pupils are equal, round, and reactive to light.  Neck:     Musculoskeletal: Normal range of motion and neck supple.  Cardiovascular:     Rate and Rhythm: Normal rate and regular rhythm.  Pulmonary:     Effort: Pulmonary effort is normal.  Abdominal:     Palpations: Abdomen is soft.  Musculoskeletal:     Left knee: She exhibits swelling.  Tenderness found. Medial joint line tenderness noted.       Legs:  Skin:    General: Skin is warm and dry.  Neurological:     Mental Status: She is alert and oriented to person, place, and time.     Cranial Nerves: No cranial nerve deficit.     Motor: No abnormal muscle tone.     Coordination: Coordination normal.     Deep Tendon Reflexes: Reflexes are normal and symmetric. Reflexes normal.  Psychiatric:        Behavior: Behavior normal.        Thought Content: Thought content normal.        Judgment: Judgment normal.           Assessment & Plan:   Encounter Diagnoses  Name Primary?  . Acute pain of left knee Yes  . Body mass index 40.0-44.9, adult ([place])   . Morbid obesity ([place])    PROCEDURE NOTE:  The patient requests injections of the left knee , verbal consent was obtained.  The left knee was prepped appropriately after time out was performed.   Sterile technique was observed and injection of 1 cc of Depo-Medrol 40 mg with several cc's of plain xylocaine. Anesthesia was provided by ethyl chloride and a 20-gauge needle was used to inject the knee area. The injection was tolerated well.  A band aid dressing was applied.  The patient was advised to apply ice later today and tomorrow to the injection sight as needed.   I am concerned about a medial meniscus tear.  She needs a MRI but will have to wait another few weeks to be approved by her insurance.  Return in one month.  Call if any problem.  Precautions discussed.   Electronically Signed Sanjuana Kava, MD 7/23/202010:21 AM

## 2018-10-16 ENCOUNTER — Other Ambulatory Visit: Payer: Self-pay | Admitting: Obstetrics and Gynecology

## 2018-10-17 ENCOUNTER — Encounter: Payer: Self-pay | Admitting: Orthopaedic Surgery

## 2018-10-17 ENCOUNTER — Ambulatory Visit: Payer: Medicaid Other | Admitting: Orthopaedic Surgery

## 2018-10-17 ENCOUNTER — Other Ambulatory Visit: Payer: Self-pay

## 2018-10-17 DIAGNOSIS — Z6841 Body Mass Index (BMI) 40.0 and over, adult: Secondary | ICD-10-CM

## 2018-10-17 DIAGNOSIS — M25562 Pain in left knee: Secondary | ICD-10-CM

## 2018-10-17 NOTE — Progress Notes (Signed)
PROCEDURE NOTE:  The patient requests injections of the left knee , verbal consent was obtained.  The left knee was prepped appropriately after time out was performed.   Sterile technique was observed and injection of 1 cc of Depo-Medrol 40 mg with several cc's of plain xylocaine. Anesthesia was provided by ethyl chloride and a 20-gauge needle was used to inject the knee area. The injection was tolerated well.  A band aid dressing was applied.  The patient was advised to apply ice later today and tomorrow to the injection sight as needed.  Return in one month.  Consider MRI if not improved.  Call if any problem.  Precautions discussed.   Electronically Signed Sanjuana Kava, MD 8/20/20209:37 AM

## 2018-11-01 ENCOUNTER — Other Ambulatory Visit: Payer: Self-pay

## 2018-11-01 ENCOUNTER — Encounter: Payer: Self-pay | Admitting: Gastroenterology

## 2018-11-01 ENCOUNTER — Ambulatory Visit (INDEPENDENT_AMBULATORY_CARE_PROVIDER_SITE_OTHER): Payer: Medicaid Other | Admitting: Gastroenterology

## 2018-11-01 VITALS — BP 119/84 | HR 90 | Temp 96.9°F | Ht 63.0 in | Wt 237.2 lb

## 2018-11-01 DIAGNOSIS — K7030 Alcoholic cirrhosis of liver without ascites: Secondary | ICD-10-CM | POA: Diagnosis not present

## 2018-11-01 MED ORDER — LACTULOSE 10 GM/15ML PO SOLN: ORAL | 5 refills | Status: DC

## 2018-11-01 MED ORDER — SPIRONOLACTONE 50 MG PO TABS: 50.0000 mg | ORAL_TABLET | Freq: Every day | ORAL | 5 refills | Status: DC

## 2018-11-01 MED ORDER — CHLORDIAZEPOXIDE HCL 10 MG PO CAPS: 10.0000 mg | ORAL_CAPSULE | Freq: Three times a day (TID) | ORAL | 0 refills | Status: DC | PRN

## 2018-11-01 MED ORDER — FUROSEMIDE 40 MG PO TABS: 40.0000 mg | ORAL_TABLET | Freq: Every day | ORAL | 5 refills | Status: DC

## 2018-11-01 MED ORDER — PROPRANOLOL HCL 20 MG PO TABS: 20.0000 mg | ORAL_TABLET | Freq: Two times a day (BID) | ORAL | 5 refills | Status: DC

## 2018-11-01 NOTE — Patient Instructions (Signed)
1. I will send in all of your prescriptions later today, there will be five total. 2. Please have your labs and ultrasound done.  3. It is so important for your overall health and liver health to avoid ALL alcohol. Please consider calling the helpline number provided today.    Low-Sodium Eating Plan Sodium, which is an element that makes up salt, helps you maintain a healthy balance of fluids in your body. Too much sodium can increase your blood pressure and cause fluid and waste to be held in your body. Your health care provider or dietitian may recommend following this plan if you have high blood pressure (hypertension), kidney disease, liver disease, or heart failure. Eating less sodium can help lower your blood pressure, reduce swelling, and protect your heart, liver, and kidneys. What are tips for following this plan? General guidelines  Most people on this plan should limit their sodium intake to 1,500-2,000 mg (milligrams) of sodium each day. Reading food labels   The Nutrition Facts label lists the amount of sodium in one serving of the food. If you eat more than one serving, you must multiply the listed amount of sodium by the number of servings.  Choose foods with less than 140 mg of sodium per serving.  Avoid foods with 300 mg of sodium or more per serving. Shopping  Look for lower-sodium products, often labeled as "low-sodium" or "no salt added."  Always check the sodium content even if foods are labeled as "unsalted" or "no salt added".  Buy fresh foods. ? Avoid canned foods and premade or frozen meals. ? Avoid canned, cured, or processed meats  Buy breads that have less than 80 mg of sodium per slice. Cooking  Eat more home-cooked food and less restaurant, buffet, and fast food.  Avoid adding salt when cooking. Use salt-free seasonings or herbs instead of table salt or sea salt. Check with your health care provider or pharmacist before using salt substitutes.  Cook  with plant-based oils, such as canola, sunflower, or olive oil. Meal planning  When eating at a restaurant, ask that your food be prepared with less salt or no salt, if possible.  Avoid foods that contain MSG (monosodium glutamate). MSG is sometimes added to [place] food, bouillon, and some canned foods. What foods are recommended? The items listed may not be a complete list. Talk with your dietitian about what dietary choices are best for you. Grains Low-sodium cereals, including oats, puffed wheat and rice, and shredded wheat. Low-sodium crackers. Unsalted rice. Unsalted pasta. Low-sodium bread. Whole-grain breads and whole-grain pasta. Vegetables Fresh or frozen vegetables. "No salt added" canned vegetables. "No salt added" tomato sauce and paste. Low-sodium or reduced-sodium tomato and vegetable juice. Fruits Fresh, frozen, or canned fruit. Fruit juice. Meats and other protein foods Fresh or frozen (no salt added) meat, poultry, seafood, and fish. Low-sodium canned tuna and salmon. Unsalted nuts. Dried peas, beans, and lentils without added salt. Unsalted canned beans. Eggs. Unsalted nut butters. Dairy Milk. Soy milk. Cheese that is naturally low in sodium, such as ricotta cheese, fresh mozzarella, or Swiss cheese Low-sodium or reduced-sodium cheese. Cream cheese. Yogurt. Fats and oils Unsalted butter. Unsalted margarine with no trans fat. Vegetable oils such as canola or olive oils. Seasonings and other foods Fresh and dried herbs and spices. Salt-free seasonings. Low-sodium mustard and ketchup. Sodium-free salad dressing. Sodium-free light mayonnaise. Fresh or refrigerated horseradish. Lemon juice. Vinegar. Homemade, reduced-sodium, or low-sodium soups. Unsalted popcorn and pretzels. Low-salt or salt-free chips. What  foods are not recommended? The items listed may not be a complete list. Talk with your dietitian about what dietary choices are best for you. Grains Instant hot cereals.  Bread stuffing, pancake, and biscuit mixes. Croutons. Seasoned rice or pasta mixes. Noodle soup cups. Boxed or frozen macaroni and cheese. Regular salted crackers. Self-rising flour. Vegetables Sauerkraut, pickled vegetables, and relishes. Olives. [place] fries. Onion rings. Regular canned vegetables (not low-sodium or reduced-sodium). Regular canned tomato sauce and paste (not low-sodium or reduced-sodium). Regular tomato and vegetable juice (not low-sodium or reduced-sodium). Frozen vegetables in sauces. Meats and other protein foods Meat or fish that is salted, canned, smoked, spiced, or pickled. Bacon, ham, sausage, hotdogs, corned beef, chipped beef, packaged lunch meats, salt pork, jerky, pickled herring, anchovies, regular canned tuna, sardines, salted nuts. Dairy Processed cheese and cheese spreads. Cheese curds. Blue cheese. Feta cheese. String cheese. Regular cottage cheese. Buttermilk. Canned milk. Fats and oils Salted butter. Regular margarine. Ghee. Bacon fat. Seasonings and other foods Onion salt, garlic salt, seasoned salt, table salt, and sea salt. Canned and packaged gravies. Worcestershire sauce. Tartar sauce. Barbecue sauce. Teriyaki sauce. Soy sauce, including reduced-sodium. Steak sauce. Fish sauce. Oyster sauce. Cocktail sauce. Horseradish that you find on the shelf. Regular ketchup and mustard. Meat flavorings and tenderizers. Bouillon cubes. Hot sauce and Tabasco sauce. Premade or packaged marinades. Premade or packaged taco seasonings. Relishes. Regular salad dressings. Salsa. Potato and tortilla chips. Corn chips and puffs. Salted popcorn and pretzels. Canned or dried soups. Pizza. Frozen entrees and pot pies. Summary  Eating less sodium can help lower your blood pressure, reduce swelling, and protect your heart, liver, and kidneys.  Most people on this plan should limit their sodium intake to 1,500-2,000 mg (milligrams) of sodium each day.  Canned, boxed, and frozen foods  are high in sodium. Restaurant foods, fast foods, and pizza are also very high in sodium. You also get sodium by adding salt to food.  Try to cook at home, eat more fresh fruits and vegetables, and eat less fast food, canned, processed, or prepared foods. This information is not intended to replace advice given to you by your health care provider. Make sure you discuss any questions you have with your health care provider. Document Released: 08/05/2001 Document Revised: 01/26/2017 Document Reviewed: 02/07/2016 Elsevier Patient Education  2020 [employer].

## 2018-11-01 NOTE — Assessment & Plan Note (Signed)
Cirrhosis due to etoh abuse. Last seen in 02/2018. Unfortunately she has relapsed. Recent DUI. She is living with her mom although reports she has a new apartment. She did not receive disability. She wants to continue her sobriety but feels strong urge to drink. She reports last drink was 3 weeks ago. She complains of lower extremity edema and abdominal swelling. Overall weight is down 15 pounds in the past year. She is overdue for labs, u/s. She is out of her medications.   -update labs -update ruq u/s -Propranolol 20mg  BID -Avoid etoh, [employer] information provided. Urged her to call for help for her depression/anxiety/substance abuse -refill her lactulose, aldactone, lasix -low dose librium 10mg  tid prn anxiety/etoh withdrawal -return for ov in 3 months.
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
## 2018-11-01 NOTE — Patient Instructions (Signed)
PA for [place] abd RUQ approved via [facility]. PA# [id number].

## 2018-11-07 ENCOUNTER — Encounter: Payer: Self-pay | Admitting: Orthopaedic Surgery

## 2018-11-07 ENCOUNTER — Other Ambulatory Visit: Payer: Self-pay

## 2018-11-07 ENCOUNTER — Telehealth: Payer: Self-pay | Admitting: Orthopaedic Surgery

## 2018-11-07 ENCOUNTER — Ambulatory Visit: Payer: Medicaid Other | Admitting: Orthopaedic Surgery

## 2018-11-07 VITALS — Wt 240.0 lb

## 2018-11-07 DIAGNOSIS — Z6841 Body Mass Index (BMI) 40.0 and over, adult: Secondary | ICD-10-CM

## 2018-11-07 DIAGNOSIS — G8929 Other chronic pain: Secondary | ICD-10-CM

## 2018-11-07 DIAGNOSIS — M25562 Pain in left knee: Secondary | ICD-10-CM | POA: Diagnosis not present

## 2018-11-07 MED ORDER — HYDROCODONE-ACETAMINOPHEN 5-325 MG PO TABS: ORAL_TABLET | ORAL | 0 refills | Status: DC

## 2018-11-07 NOTE — Progress Notes (Signed)
Patient [id number] Whitney Bush, female DOB:09-Oct-1970, 48 y.o. [id number]  Chief Complaint  Patient presents with  . Knee Pain    left knee request injection    HPI  Whitney Bush is a 48 y.o. female who has continued pain of the left knee.  It gave way and she fell three days ago. She has swelling, pain and popping plus limp to the left. She has had injection, NSAIDs with no help.  She is limping and hurting.  I am concerned about a meniscus tear on the left. I will order a MRI.   Body mass index is 42.51 kg/m.  The patient meets the AMA guidelines for Morbid (severe) obesity with a BMI > 40.0 and I have recommended weight loss.   ROS  Review of Systems  Constitutional: Positive for activity change.  Musculoskeletal: Positive for arthralgias, gait problem and joint swelling.  Neurological: Positive for headaches.  All other systems reviewed and are negative.   All other systems reviewed and are negative.  The following is a summary of the past history medically, past history surgically, known current medicines, social history and family history.  This information is gathered electronically by the computer from prior information and documentation.  I review this each visit and have found including this information at this point in the chart is beneficial and informative.    Past Medical History:  Diagnosis Date  . Alcohol abuse   . 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 Anemia   . Blood transfusion without reported diagnosis   . Boil 06/04/2015  . Chronic kidney disease   . Clotting disorder ([place])   . COPD (chronic obstructive pulmonary disease) ([place])   . Depression   . GERD (gastroesophageal reflux disease)   . GI (gastrointestinal bleed) 07/28/2015  . Heart disease   . Heart murmur   . Hyperlipidemia   . Hypertension   . Hypothyroidism   . Migraines   . OCD (obsessive compulsive disorder)   . Peripheral edema   . PTSD  (post-traumatic stress disorder)   . Tubular adenoma     Past Surgical History:  Procedure Laterality Date  . ABCESS DRAINAGE     x5; neck, arm, chest, back  . BIOPSY  02/24/2015   Procedure: BIOPSY;  Surgeon: Danie Binder, MD;  [facility];  Service: Endoscopy;;  gastric bx's  . BIOPSY  12/25/2016   Procedure: BIOPSY;  Surgeon: Daneil Dolin, MD;  [facility];  Service: Endoscopy;;  gastric  . CESAREAN SECTION    . COLONOSCOPY WITH PROPOFOL N/A 06/28/2015   Dr.Rourk- inadequate prep- One 6 mm polyp at the splenic flexure bx= tubular adenoma  . ESOPHAGEAL BANDING N/A 12/25/2016   Procedure: ESOPHAGEAL BANDING with Propofol;  Surgeon: Daneil Dolin, MD;  [facility];  Service: Endoscopy;  Laterality: N/A;  possible banding of varices  . ESOPHAGOGASTRODUODENOSCOPY (EGD) WITH PROPOFOL N/A 02/24/2015   SLF: Grade II esophageal varices Moderate portal hypertensive gastropathy Mild erosive gastritis anemia likely due to many factors: gastritis, gastropathy, coagulopathy, chronic disease  . ESOPHAGOGASTRODUODENOSCOPY (EGD) WITH PROPOFOL N/A 12/25/2016   Procedure: ESOPHAGOGASTRODUODENOSCOPY (EGD) WITH PROPOFOL;  Surgeon: Daneil Dolin, MD;  [facility];  Service: Endoscopy;  Laterality: N/A;  8:45am  . MALONEY DILATION N/A 12/25/2016   Procedure: Venia Minks DILATION;  Surgeon: Daneil Dolin, MD;  [facility];  Service: Endoscopy;  Laterality:  N/A;  . POLYPECTOMY  06/28/2015   Procedure: POLYPECTOMY;  Surgeon: Daneil Dolin, MD;  [facility];  Service: Endoscopy;;  at splenic flexure  . SUPRACERVICAL ABDOMINAL HYSTERECTOMY N/A 10/02/2017   Procedure: HYSTERECTOMY SUPRACERVICAL ABDOMINAL;  Surgeon: Jonnie Kind, MD;  [facility]: [facility];  Service: Gynecology;  Laterality: N/A;    Family History  Problem Relation Age of Onset  . Diabetes Father   . Hyperlipidemia Father   . Alcohol abuse Father   . Hypertension Father    . Cancer Other   . Cervical cancer Maternal Grandmother   . Lung cancer Maternal Grandfather   . Alcohol abuse Other        multiple family members  . Diabetes Sister   . Hypertension Sister   . Hypertension Brother   . Colon cancer Neg Hx   . Liver disease Neg Hx     Social History Social History   Tobacco Use  . Smoking status: Former Smoker    Packs/day: 1.00    Years: 5.00    Pack years: 5.00    Types: Cigarettes    Quit date: 12/29/2010    Years since quitting: 7.8  . Smokeless tobacco: Former Systems developer    Types: Snuff    Quit date: 09/28/2010  Substance Use Topics  . Alcohol use: Yes    Comment: weekends  . Drug use: No    No Known Allergies  Current Outpatient Medications  Medication Sig Dispense Refill  . albuterol (PROVENTIL HFA;VENTOLIN HFA) 108 (90 Base) MCG/ACT inhaler Inhale 1-2 puffs into the lungs every 6 (six) hours as needed for wheezing or shortness of breath. 1 Inhaler 0  . B Complex-C (SUPER B COMPLEX PO) Take 1 capsule by mouth daily.     . chlordiazePOXIDE (LIBRIUM) 10 MG capsule Take 1 capsule (10 mg total) by mouth 3 (three) times daily as needed for anxiety or withdrawal. 30 capsule 0  . FLUoxetine (PROZAC) 20 MG capsule Take 20 mg by mouth daily.    . furosemide (LASIX) 40 MG tablet Take 1 tablet (40 mg total) by mouth daily. 30 tablet 5  . lactulose (CONSTULOSE) 10 GM/15ML solution TAKE 30 MLS BY MOUTH TWICE DAILY AS NEEDED FOR MILD CONSTIPATION OR MODERATE CONSTIPATION. 1892 mL 5  . propranolol (INDERAL) 20 MG tablet Take 1 tablet (20 mg total) by mouth 2 (two) times daily. 60 tablet 5  . spironolactone (ALDACTONE) 50 MG tablet Take 1 tablet (50 mg total) by mouth daily. 30 tablet 5  . traZODone (DESYREL) 100 MG tablet Take 100 mg by mouth daily.     No current facility-administered medications for this visit.      Physical Exam  Weight 240 lb (108.9 kg), last menstrual period 09/12/2017.  Constitutional: overall normal hygiene, normal  nutrition, well developed, normal grooming, normal body habitus. Assistive device:none  Musculoskeletal: gait and station Limp left, muscle tone and strength are normal, no tremors or atrophy is present.  .  Neurological: coordination overall normal.  Deep tendon reflex/nerve stretch intact.  Sensation normal.  Cranial nerves II-XII intact.   Skin:   Normal overall no scars, lesions, ulcers or rashes. No psoriasis.  Psychiatric: Alert and oriented x 3.  Recent memory intact, remote memory unclear.  Normal mood and affect. Well groomed.  Good eye contact.  Cardiovascular: overall no swelling, no varicosities, no edema bilaterally, normal temperatures of the legs and arms, no clubbing, cyanosis and good capillary refill.  Lymphatic: palpation is normal.  Left knee has effusion, crepitus, ROM 0 to 105, limp left, positive medial McMurray left, NV intact. She has hematoma mid lateral left thigh.  All other systems reviewed and are negative   The patient has been educated about the nature of the problem(s) and counseled on treatment options.  The patient appeared to understand what I have discussed and is in agreement with it.  Encounter Diagnosis  Name Primary?  . Chronic pain of left knee Yes    PLAN Call if any problems.  Precautions discussed.  Continue current medications.   Return to clinic 3 weeks   I have given pain medicine.  I have reviewed the [facility] [place] web site prior to prescribing narcotic medicine for this patient.      Get MRI of the left knee.  Electronically Signed Sanjuana Kava, MD 9/10/20209:46 AM

## 2018-11-07 NOTE — Telephone Encounter (Signed)
Faxed "New Prescription Request" received from [facility] for prescribed medication:  NORCO 5-325mg  Tab / quantity 30 tablets  - indicates this medication requires prior authorization. Please advise if a different medication is to be prescribed due to prior authorization Pharmacy ph# [phone] / Fax# [phone]

## 2018-11-08 NOTE — Telephone Encounter (Signed)
Confirmation [id number] Prior Approval [id number] Status:APPROVED  This is for hydrocodone Rx written yesterday. I tried to call the pharmacy to advise, cannot get anyone to answer.

## 2018-11-08 NOTE — Telephone Encounter (Signed)
No.  I gave in accordance with Medicaid guidelines.  Why is pharmacy denying this request?  Ask the pharmacy why.  If they will not fill, her primary care will need to do so.

## 2018-11-11 ENCOUNTER — Ambulatory Visit (HOSPITAL_COMMUNITY)
Admission: RE | Admit: 2018-11-11 | Discharge: 2018-11-11 | Disposition: A | Discharge status: Home / Self Care | Payer: Medicaid Other | Source: Ambulatory Visit | Attending: Gastroenterology | Admitting: Gastroenterology

## 2018-11-11 ENCOUNTER — Other Ambulatory Visit: Payer: Self-pay

## 2018-11-11 DIAGNOSIS — K7030 Alcoholic cirrhosis of liver without ascites: Secondary | ICD-10-CM | POA: Insufficient documentation

## 2018-11-14 ENCOUNTER — Ambulatory Visit: Payer: Medicaid Other | Admitting: Orthopaedic Surgery

## 2018-11-27 ENCOUNTER — Ambulatory Visit (HOSPITAL_COMMUNITY)
Admission: RE | Admit: 2018-11-27 | Discharge: 2018-11-27 | Disposition: A | Discharge status: Home / Self Care | Payer: Medicaid Other | Source: Ambulatory Visit | Attending: Orthopaedic Surgery | Admitting: Orthopaedic Surgery

## 2018-11-27 ENCOUNTER — Other Ambulatory Visit: Payer: Self-pay

## 2018-11-27 DIAGNOSIS — G8929 Other chronic pain: Secondary | ICD-10-CM | POA: Insufficient documentation

## 2018-11-27 DIAGNOSIS — M25562 Pain in left knee: Secondary | ICD-10-CM | POA: Insufficient documentation

## 2018-12-03 ENCOUNTER — Other Ambulatory Visit: Payer: Self-pay

## 2018-12-03 ENCOUNTER — Ambulatory Visit: Payer: Medicaid Other | Admitting: Orthopaedic Surgery

## 2018-12-03 ENCOUNTER — Encounter: Payer: Self-pay | Admitting: Orthopaedic Surgery

## 2018-12-03 VITALS — BP 129/93 | HR 78 | Temp 97.2°F | Ht 63.0 in | Wt 238.2 lb

## 2018-12-03 DIAGNOSIS — M25562 Pain in left knee: Secondary | ICD-10-CM | POA: Diagnosis not present

## 2018-12-03 DIAGNOSIS — Z6841 Body Mass Index (BMI) 40.0 and over, adult: Secondary | ICD-10-CM

## 2018-12-03 DIAGNOSIS — G8929 Other chronic pain: Secondary | ICD-10-CM

## 2018-12-03 NOTE — Progress Notes (Signed)
Patient [id number] Whitney Bush, female DOB:05/25/1970, 48 y.o. [id number]  Chief Complaint  Patient presents with  . MRI results    LT knee, hard to bend at times when standing,     HPI  Whitney Bush is a 48 y.o. female who has continued pain of the left knee.  She had MRI done which showed:  IMPRESSION: Large flap tear posterior horn medial meniscus with a fragment flipped along the posterior margin of the medial side of the intercondylar notch. The body of the medial meniscus is severely degenerated and diminutive.  Large radial tear or flap tear along the free edge of the posterior horn of the lateral meniscus at and just peripheral to the meniscal root.  Grossly abnormal ACL could be due to severe mucoid degeneration or remote tear. The knee is likely ACL deficient.  Small T1 and T2 hypointense fragment anterior to the anterior horn lateral meniscus could be hyaline cartilage or meniscal fragment.  Large focus of hypertrophied synovium anterior to the expected location of the ACL.  I have explained the findings to her.  She will need surgery. I will have Dr. Aline Brochure see her. She is agreeable.  Body mass index is 42.2 kg/m.  The patient meets the AMA guidelines for Morbid (severe) obesity with a BMI > 40.0 and I have recommended weight loss.   ROS  Review of Systems  Constitutional: Positive for activity change.  Musculoskeletal: Positive for arthralgias, gait problem and joint swelling.  Neurological: Positive for headaches.  All other systems reviewed and are negative.   All other systems reviewed and are negative.  The following is a summary of the past history medically, past history surgically, known current medicines, social history and family history.  This information is gathered electronically by the computer from prior information and documentation.  I review this each visit and have found including this information at this point in the chart is  beneficial and informative.    Past Medical History:  Diagnosis Date  . Alcohol abuse   . 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 Anemia   . Blood transfusion without reported diagnosis   . Boil 06/04/2015  . Chronic kidney disease   . Clotting disorder ([place])   . COPD (chronic obstructive pulmonary disease) ([place])   . Depression   . GERD (gastroesophageal reflux disease)   . GI (gastrointestinal bleed) 07/28/2015  . Heart disease   . Heart murmur   . Hyperlipidemia   . Hypertension   . Hypothyroidism   . Migraines   . OCD (obsessive compulsive disorder)   . Peripheral edema   . PTSD (post-traumatic stress disorder)   . Tubular adenoma     Past Surgical History:  Procedure Laterality Date  . ABCESS DRAINAGE     x5; neck, arm, chest, back  . BIOPSY  02/24/2015   Procedure: BIOPSY;  Surgeon: Danie Binder, MD;  [facility];  Service: Endoscopy;;  gastric bx's  . BIOPSY  12/25/2016   Procedure: BIOPSY;  Surgeon: Daneil Dolin, MD;  [facility];  Service: Endoscopy;;  gastric  . CESAREAN SECTION    . COLONOSCOPY WITH PROPOFOL N/A 06/28/2015   Dr.Rourk- inadequate prep- One 6 mm polyp at the splenic flexure bx= tubular adenoma  . ESOPHAGEAL BANDING N/A 12/25/2016   Procedure: ESOPHAGEAL BANDING with Propofol;  Surgeon: Daneil Dolin, MD;  [facility];  Service: Endoscopy;  Laterality: N/A;  possible banding of varices  . ESOPHAGOGASTRODUODENOSCOPY (EGD) WITH PROPOFOL N/A 02/24/2015   SLF: Grade II esophageal varices Moderate portal hypertensive gastropathy Mild erosive gastritis anemia likely due to many factors: gastritis, gastropathy, coagulopathy, chronic disease  . ESOPHAGOGASTRODUODENOSCOPY (EGD) WITH PROPOFOL N/A 12/25/2016   Procedure: ESOPHAGOGASTRODUODENOSCOPY (EGD) WITH PROPOFOL;  Surgeon: Daneil Dolin, MD;  [facility];  Service: Endoscopy;  Laterality: N/A;  8:45am  . MALONEY DILATION N/A  12/25/2016   Procedure: Venia Minks DILATION;  Surgeon: Daneil Dolin, MD;  [facility];  Service: Endoscopy;  Laterality: N/A;  . POLYPECTOMY  06/28/2015   Procedure: POLYPECTOMY;  Surgeon: Daneil Dolin, MD;  [facility];  Service: Endoscopy;;  at splenic flexure  . SUPRACERVICAL ABDOMINAL HYSTERECTOMY N/A 10/02/2017   Procedure: HYSTERECTOMY SUPRACERVICAL ABDOMINAL;  Surgeon: Jonnie Kind, MD;  [facility];  Service: Gynecology;  Laterality: N/A;    Family History  Problem Relation Age of Onset  . Diabetes Father   . Hyperlipidemia Father   . Alcohol abuse Father   . Hypertension Father   . Cancer Other   . Cervical cancer Maternal Grandmother   . Lung cancer Maternal Grandfather   . Alcohol abuse Other        multiple family members  . Diabetes Sister   . Hypertension Sister   . Hypertension Brother   . Colon cancer Neg Hx   . Liver disease Neg Hx     Social History Social History   Tobacco Use  . Smoking status: Former Smoker    Packs/day: 1.00    Years: 5.00    Pack years: 5.00    Types: Cigarettes    Quit date: 12/29/2010    Years since quitting: 7.9  . Smokeless tobacco: Former Systems developer    Types: Snuff    Quit date: 09/28/2010  Substance Use Topics  . Alcohol use: Yes    Comment: weekends  . Drug use: No    No Known Allergies  Current Outpatient Medications  Medication Sig Dispense Refill  . albuterol (PROVENTIL HFA;VENTOLIN HFA) 108 (90 Base) MCG/ACT inhaler Inhale 1-2 puffs into the lungs every 6 (six) hours as needed for wheezing or shortness of breath. 1 Inhaler 0  . B Complex-C (SUPER B COMPLEX PO) Take 1 capsule by mouth daily.     . chlordiazePOXIDE (LIBRIUM) 10 MG capsule Take 1 capsule (10 mg total) by mouth 3 (three) times daily as needed for anxiety or withdrawal. 30 capsule 0  . FLUoxetine (PROZAC) 20 MG capsule Take 20 mg by mouth daily.    . furosemide (LASIX) 40 MG tablet Take 1 tablet (40 mg total) by mouth daily. 30  tablet 5  . HYDROcodone-acetaminophen (NORCO/VICODIN) 5-325 MG tablet One tablet every four hours for pain. 30 tablet 0  . lactulose (CONSTULOSE) 10 GM/15ML solution TAKE 30 MLS BY MOUTH TWICE DAILY AS NEEDED FOR MILD CONSTIPATION OR MODERATE CONSTIPATION. 1892 mL 5  . propranolol (INDERAL) 20 MG tablet Take 1 tablet (20 mg total) by mouth 2 (two) times daily. 60 tablet 5  . spironolactone (ALDACTONE) 50 MG tablet Take 1 tablet (50 mg total) by mouth daily. 30 tablet 5  . traZODone (DESYREL) 100 MG tablet Take 100 mg by mouth daily.     No current facility-administered medications for this visit.      Physical Exam  Blood pressure (!) 129/93, pulse 78, temperature (!) 97.2 F (36.2 C), height 5\' 3"  (1.6 m),  weight 238 lb 3.2 oz (108 kg), last menstrual period 09/12/2017.  Constitutional: overall normal hygiene, normal nutrition, well developed, normal grooming, normal body habitus. Assistive device:none  Musculoskeletal: gait and station Limp left, muscle tone and strength are normal, no tremors or atrophy is present.  .  Neurological: coordination overall normal.  Deep tendon reflex/nerve stretch intact.  Sensation normal.  Cranial nerves II-XII intact.   Skin:   Normal overall no scars, lesions, ulcers or rashes. No psoriasis.  Psychiatric: Alert and oriented x 3.  Recent memory intact, remote memory unclear.  Normal mood and affect. Well groomed.  Good eye contact.  Cardiovascular: overall no swelling, no varicosities, no edema bilaterally, normal temperatures of the legs and arms, no clubbing, cyanosis and good capillary refill.  Lymphatic: palpation is normal.  Left knee with effusion, ROM 0 to 110, positive medial McMurray, Positive drawer sign, NV intact.  Limp left.  All other systems reviewed and are negative   The patient has been educated about the nature of the problem(s) and counseled on treatment options.  The patient appeared to understand what I have discussed and  is in agreement with it.  Encounter Diagnoses  Name Primary?  . Chronic pain of left knee Yes  . Body mass index 40.0-44.9, adult ([place])   . Morbid obesity ([place])     PLAN Call if any problems.  Precautions discussed.  Continue current medications.   Return to clinic see Dr. Aline Brochure for surgery.   Electronically Signed Sanjuana Kava, MD 10/6/202010:50 AM

## 2018-12-06 LAB — CBC WITH DIFFERENTIAL/PLATELET
Absolute Monocytes: 667 cells/uL (ref 200–950)
Basophils Absolute: 47 cells/uL (ref 0–200)
Basophils Relative: 0.5 %
Eosinophils Absolute: 103 cells/uL (ref 15–500)
Eosinophils Relative: 1.1 %
HCT: 41.4 % (ref 35.0–45.0)
Hemoglobin: 13.4 g/dL (ref 11.7–15.5)
Lymphs Abs: 2538 cells/uL (ref 850–3900)
MCH: 29.1 pg (ref 27.0–33.0)
MCHC: 32.4 g/dL (ref 32.0–36.0)
MCV: 89.8 fL (ref 80.0–100.0)
MPV: 10.7 fL (ref 7.5–12.5)
Monocytes Relative: 7.1 %
Neutro Abs: 6044 cells/uL (ref 1500–7800)
Neutrophils Relative %: 64.3 %
Platelets: 159 10*3/uL (ref 140–400)
RBC: 4.61 10*6/uL (ref 3.80–5.10)
RDW: 14.5 % (ref 11.0–15.0)
Total Lymphocyte: 27 %
WBC: 9.4 10*3/uL (ref 3.8–10.8)

## 2018-12-06 LAB — COMPREHENSIVE METABOLIC PANEL
AG Ratio: 1 (calc) (ref 1.0–2.5)
ALT: 20 U/L (ref 6–29)
AST: 24 U/L (ref 10–35)
Albumin: 4.1 g/dL (ref 3.6–5.1)
Alkaline phosphatase (APISO): 94 U/L (ref 31–125)
BUN: 18 mg/dL (ref 7–25)
CO2: 30 mmol/L (ref 20–32)
Calcium: 9.2 mg/dL (ref 8.6–10.2)
Chloride: 100 mmol/L (ref 98–110)
Creat: 0.95 mg/dL (ref 0.50–1.10)
Globulin: 4 g/dL (calc) — ABNORMAL HIGH (ref 1.9–3.7)
Glucose, Bld: 85 mg/dL (ref 65–99)
Potassium: 3.7 mmol/L (ref 3.5–5.3)
Sodium: 140 mmol/L (ref 135–146)
Total Bilirubin: 0.8 mg/dL (ref 0.2–1.2)
Total Protein: 8.1 g/dL (ref 6.1–8.1)

## 2018-12-06 LAB — PROTIME-INR
INR: 1.1
Prothrombin Time: 11.2 s (ref 9.0–11.5)

## 2018-12-06 LAB — LIPASE: Lipase: 19 U/L (ref 7–60)

## 2018-12-10 ENCOUNTER — Encounter: Payer: Self-pay | Admitting: Orthopedic Surgery

## 2018-12-10 ENCOUNTER — Ambulatory Visit: Payer: Medicaid Other | Admitting: Orthopedic Surgery

## 2018-12-16 ENCOUNTER — Encounter: Payer: Self-pay | Admitting: Internal Medicine

## 2018-12-18 ENCOUNTER — Telehealth: Payer: Self-pay

## 2018-12-18 NOTE — Telephone Encounter (Signed)
Received a fax from [facility] [place] if a pharmacist medication review.  Medications involved:  Spironolactone and Doxycycline. Placed in box for review by Neil Crouch, PA.

## 2018-12-20 ENCOUNTER — Telehealth: Payer: Self-pay | Admitting: Internal Medicine

## 2018-12-20 NOTE — Telephone Encounter (Signed)
Pt said she was returning a call from last week. I told her AM was out of the office today and she would have to call her back when she returns. [phone]

## 2018-12-20 NOTE — Telephone Encounter (Signed)
Reviewed. Please find out if pt is going to be on doxycycline just for short course. Recently given by dermatologist per [facility] [place] and not advised long term in setting of cirrhosis.

## 2018-12-23 NOTE — Telephone Encounter (Signed)
DS, returned pt's call. DS left a message for pt last week in my absence.

## 2018-12-23 NOTE — Telephone Encounter (Signed)
noted 

## 2018-12-23 NOTE — Telephone Encounter (Signed)
PT said she was given #60 tablets of Doxycycline to take one tablet bid. She has only been taking onc a day because she is taking care of her grandson and and it makes her kind of queasy.  She said she has one refill on it but does not have to take it now, the refill is good until 10/2019. She asked she she not get it refilled now. I told her she needs to discuss with her dermatologist. I just made her aware that Magda Paganini said she does not need to be on it long term because of cirrhosis.  She expressed understanding.

## 2018-12-24 ENCOUNTER — Other Ambulatory Visit: Payer: Self-pay

## 2018-12-24 ENCOUNTER — Ambulatory Visit (INDEPENDENT_AMBULATORY_CARE_PROVIDER_SITE_OTHER): Payer: Medicaid Other | Admitting: Orthopedic Surgery

## 2018-12-24 ENCOUNTER — Encounter: Payer: Self-pay | Admitting: Orthopedic Surgery

## 2018-12-24 VITALS — BP 117/83 | HR 77 | Ht 63.0 in | Wt 238.0 lb

## 2018-12-24 DIAGNOSIS — M23301 Other meniscus derangements, unspecified lateral meniscus, left knee: Secondary | ICD-10-CM

## 2018-12-24 DIAGNOSIS — M23322 Other meniscus derangements, posterior horn of medial meniscus, left knee: Secondary | ICD-10-CM | POA: Diagnosis not present

## 2018-12-24 DIAGNOSIS — M238X2 Other internal derangements of left knee: Secondary | ICD-10-CM | POA: Diagnosis not present

## 2018-12-24 NOTE — Patient Instructions (Signed)
Meniscus Injury, Arthroscopy   Arthroscopy is a surgical procedure that involves the use of a small scope that has a camera and surgical instruments on the end (arthroscope). An arthroscope can be used to repair your meniscus injury.  LET YOUR HEALTH CARE PROVIDER KNOW ABOUT:  Any allergies you have.  All medicines you are taking, including vitamins, herbs, eyedrops, creams, and over-the-counter medicines.  Any recent colds or infections you have had or currently have.  Previous problems you or members of your family have had with the use of anesthetics.  Any blood disorders or blood clotting problems you have.  Previous surgeries you have had.  Medical conditions you have. RISKS AND COMPLICATIONS Generally, this is a safe procedure. However, as with any procedure, problems can occur. Possible problems include:  Damage to nerves or blood vessels.  Excess bleeding.  Blood clots.  Infection. BEFORE THE PROCEDURE  Do not eat or drink for 6-8 hours before the procedure.  Take medicines as directed by your surgeon. Ask your surgeon about changing or stopping your regular medicines.  You may have lab tests the morning of surgery. PROCEDURE  You will be given one of the following:   A medicine that numbs the area (local anesthesia).  A medicine that makes you go to sleep (general anesthesia).  A medicine injected into your spine that numbs your body below the waist (spinal anesthesia). Most often, several small cuts (incisions) are made in the knee. The arthroscope and instruments go into the incisions to repair the damage. The torn portion of the meniscus is removed.   AFTER THE PROCEDURE  You will be taken to the recovery area where your progress will be monitored. When you are awake, stable, and taking fluids without complications, you will be allowed to go home. This is usually the same day. A torn or stretched ligament (ligament sprain) may take 6-8 weeks to heal.   It  takes about the 4-6 WEEKS if your surgeon removed a torn meniscus.  A repaired meniscus may require 6-12 weeks of recovery time.  A torn ligament needing reconstructive surgery may take 6-12 months to heal fully.   This information is not intended to replace advice given to you by your health care provider. Make sure you discuss any questions you have with your health care provider. You have decided to proceed with operative arthroscopy of the knee. You have decided not to continue with nonoperative measures such as but not limited to oral medication, weight loss, activity modification, physical therapy, bracing, or injection.  We will perform operative arthroscopy of the knee. Some of the risks associated with arthroscopic surgery of the knee include but are not limited to Bleeding Infection Swelling Stiffness Blood clot Pain Need for knee replacement surgery    In compliance with recent [place] law in federal regulation regarding opioid use and abuse and addiction, we will taper (stop) opioid medication after 2 weeks.  If you're not comfortable with these risks and would like to continue with nonoperative treatment please let Dr.  know prior to your surgery. 

## 2018-12-24 NOTE — Progress Notes (Signed)
PREOP CONSULT/REFERRAL INTRA-OFFICE FROM DR Tyrone Apple   Chief Complaint  Patient presents with  . Knee Pain    left x 1 year surgical consult     Whitney Bush is 48 years old she has had multiple injuries to this left knee and has been treated medically without much improvement.  She complains of severe pain in the left knee which is intermittent.  She has had longstanding symptoms and she associates giving out symptoms especially with turning and then the knee will pop back and on its own it intermittently swells also.  She is employed as a International aid/development worker and has no other vigorous activities   Review of Systems  Constitutional: Negative for fever.  Respiratory: Negative for shortness of breath.   Cardiovascular: Negative for chest pain.  Musculoskeletal: Positive for back pain and joint pain.  Neurological: Negative for sensory change and focal weakness.  All other systems reviewed and are negative.    Past Medical History:  Diagnosis Date  . Alcohol abuse   . 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 Anemia   . Blood transfusion without reported diagnosis   . Boil 06/04/2015  . Chronic kidney disease   . Clotting disorder ([place])   . COPD (chronic obstructive pulmonary disease) ([place])   . Depression   . GERD (gastroesophageal reflux disease)   . GI (gastrointestinal bleed) 07/28/2015  . Heart disease   . Heart murmur   . Hyperlipidemia   . Hypertension   . Hypothyroidism   . Migraines   . OCD (obsessive compulsive disorder)   . Peripheral edema   . PTSD (post-traumatic stress disorder)   . Tubular adenoma     Past Surgical History:  Procedure Laterality Date  . ABCESS DRAINAGE     x5; neck, arm, chest, back  . BIOPSY  02/24/2015   Procedure: BIOPSY;  Surgeon: Danie Binder, MD;  [facility]: [facility];  Service: Endoscopy;;  gastric bx's  . BIOPSY  12/25/2016   Procedure: BIOPSY;  Surgeon: Daneil Dolin, MD;  Location: [facility];   Service: Endoscopy;;  gastric  . CESAREAN SECTION    . COLONOSCOPY WITH PROPOFOL N/A 06/28/2015   Dr.Rourk- inadequate prep- One 6 mm polyp at the splenic flexure bx= tubular adenoma  . ESOPHAGEAL BANDING N/A 12/25/2016   Procedure: ESOPHAGEAL BANDING with Propofol;  Surgeon: Daneil Dolin, MD;  Location: [facility];  Service: Endoscopy;  Laterality: N/A;  possible banding of varices  . ESOPHAGOGASTRODUODENOSCOPY (EGD) WITH PROPOFOL N/A 02/24/2015   SLF: Grade II esophageal varices Moderate portal hypertensive gastropathy Mild erosive gastritis anemia likely due to many factors: gastritis, gastropathy, coagulopathy, chronic disease  . ESOPHAGOGASTRODUODENOSCOPY (EGD) WITH PROPOFOL N/A 12/25/2016   Procedure: ESOPHAGOGASTRODUODENOSCOPY (EGD) WITH PROPOFOL;  Surgeon: Daneil Dolin, MD;  [facility]: [facility];  Service: Endoscopy;  Laterality: N/A;  8:45am  . MALONEY DILATION N/A 12/25/2016   Procedure: Venia Minks DILATION;  Surgeon: Daneil Dolin, MD;  Location: [facility];  Service: Endoscopy;  Laterality: N/A;  . POLYPECTOMY  06/28/2015   Procedure: POLYPECTOMY;  Surgeon: Daneil Dolin, MD;  Location: [facility];  Service: Endoscopy;;  at splenic flexure  . SUPRACERVICAL ABDOMINAL HYSTERECTOMY N/A 10/02/2017   Procedure: HYSTERECTOMY SUPRACERVICAL ABDOMINAL;  Surgeon: Jonnie Kind, MD;  Location: [facility];  Service: Gynecology;  Laterality: N/A;    Family History  Problem Relation Age of Onset  . Diabetes  Father   . Hyperlipidemia Father   . Alcohol abuse Father   . Hypertension Father   . Cancer Other   . Cervical cancer Maternal Grandmother   . Lung cancer Maternal Grandfather   . Alcohol abuse Other        multiple family members  . Diabetes Sister   . Hypertension Sister   . Hypertension Brother   . Colon cancer Neg Hx   . Liver disease Neg Hx    Social History   Tobacco Use  . Smoking status: Former Smoker    Packs/day: 1.00    Years: 5.00    Pack years: 5.00     Types: Cigarettes    Quit date: 12/29/2010    Years since quitting: 7.9  . Smokeless tobacco: Former Systems developer    Types: Snuff    Quit date: 09/28/2010  Substance Use Topics  . Alcohol use: Yes    Comment: weekends  . Drug use: No    No Known Allergies   Current Meds  Medication Sig  . albuterol (PROVENTIL HFA;VENTOLIN HFA) 108 (90 Base) MCG/ACT inhaler Inhale 1-2 puffs into the lungs every 6 (six) hours as needed for wheezing or shortness of breath.  . B Complex-C (SUPER B COMPLEX PO) Take 1 capsule by mouth daily.   . chlordiazePOXIDE (LIBRIUM) 10 MG capsule Take 1 capsule (10 mg total) by mouth 3 (three) times daily as needed for anxiety or withdrawal.  . FLUoxetine (PROZAC) 20 MG capsule Take 20 mg by mouth daily.  . furosemide (LASIX) 40 MG tablet Take 1 tablet (40 mg total) by mouth daily.  Marland Kitchen lactulose (CONSTULOSE) 10 GM/15ML solution TAKE 30 MLS BY MOUTH TWICE DAILY AS NEEDED FOR MILD CONSTIPATION OR MODERATE CONSTIPATION.  Marland Kitchen propranolol (INDERAL) 20 MG tablet Take 1 tablet (20 mg total) by mouth 2 (two) times daily.  Marland Kitchen spironolactone (ALDACTONE) 50 MG tablet Take 1 tablet (50 mg total) by mouth daily.  . traZODone (DESYREL) 100 MG tablet Take 100 mg by mouth daily.    BP 117/83   Pulse 77   Ht 5\' 3"  (1.6 m)   Wt 238 lb (108 kg)   LMP 09/12/2017 (Exact Date) Comment:    BMI 42.16 kg/m   Physical Exam  The patient's body habitus is endomorphic her nutrition looks good her development is normal she has no gross deformities she is well-groomed  There are no peripheral varicose veins or swelling the pulses and temperature are good there is no edema or tenderness  The groin area shows no lymph node enlargement  She walks with a noticeable limp  Her upper extremities are well aligned without contracture subluxation atrophy or tremor and the skin is normal  In the right knee we find normal alignment with no tenderness we find a stable knee with normal range of motion good  strength and muscle tone  On the left knee we find a tender medial and lateral compartment with stable patella questionable laxity in the ACL normal PCL normal collateral ligaments no effusion she still has full range of motion normal strength and muscle tone  MEDICAL DECISION SECTION  xrays ordered?  No  My independent reading of xrays: MRI ACL does look deficient as indicated in the report and she has meniscal tear as well  IMPRESSION: Large flap tear posterior horn medial meniscus with a fragment flipped along the posterior margin of the medial side of the intercondylar notch. The body of the medial meniscus is severely degenerated and  diminutive.   Large radial tear or flap tear along the free edge of the posterior horn of the lateral meniscus at and just peripheral to the meniscal root.   Grossly abnormal ACL could be due to severe mucoid degeneration or remote tear. The knee is likely ACL deficient.   Small T1 and T2 hypointense fragment anterior to the anterior horn lateral meniscus could be hyaline cartilage or meniscal fragment.   Large focus of hypertrophied synovium anterior to the expected location of the ACL.     Electronically Signed   By: Inge Rise M.D.   On: 11/27/2018 14:12    Encounter Diagnoses  Name Primary?  . Derangement of posterior horn of medial meniscus of left knee Yes  . Lateral meniscus derangement, left   . Laxity of left anterior cruciate ligament      PLAN:   Surgical procedure planned: Arthroscopy left knee medial lateral meniscectomy exam under anesthesia with evaluation intraoperatively of the ACL   The procedure has been fully reviewed with the patient; we also discussed the fact that I cannot definitively tell if her ACL is torn unless I look at it and probe it and evaluate her and anesthesia.  She says she understands this.  On this particular procedure which is going into clean up everything in terms of the tears.  She is  low demand so most of the time these patients can survive without ACL surgery but we certainly would proceed in another sitting if necessary  The risks and benefits of surgery have been discussed and explained and understood. Alternative treatment has also been reviewed, questions were encouraged and answered. The postoperative plan is also been reviewed.  Nonsurgical treatment as described in the history and physical section was attempted and unsuccessful and the patient has agreed to proceed with surgical intervention to improve their situation.  No orders of the defined types were placed in this encounter.   Arther Abbott, MD 12/24/2018 5:14 PM

## 2019-01-03 NOTE — Patient Instructions (Signed)
MABLEAN VENDITTO  01/03/2019     @PREFPERIOPPHARMACY @   Your procedure is scheduled on  01/09/2019.  Report to [facility] at  [phone]   A.M.  Call this number if you have problems the morning of surgery:  [phone]   Remember:  Do not eat or drink after midnight.                        Take these medicines the morning of surgery with A SIP OF WATER librium, prozac, propranolol.    Do not wear jewelry, make-up or nail polish.  Do not wear lotions, powders, or perfumes. Please wear deodorant and brush your teeth.  Do not shave 48 hours prior to surgery.  Men may shave face and neck.  Do not bring valuables to the hospital.  [facility] is not responsible for any belongings or valuables.  Contacts, dentures or bridgework may not be worn into surgery.  Leave your suitcase in the car.  After surgery it may be brought to your room.  For patients admitted to the hospital, discharge time will be determined by your treatment team.  Patients discharged the day of surgery will not be allowed to drive home.   Name and phone number of your driver:   family Special instructions:  None  Please read over the following fact sheets that you were given. Anesthesia Post-op Instructions and Care and Recovery After Surgery       Arthroscopic Knee Ligament Repair, Care After This sheet gives you information about how to care for yourself after your procedure. Your health care provider may also give you more specific instructions. If you have problems or questions, contact your health care provider. What can I expect after the procedure? After the procedure, it is common to have:  Pain in your knee.  Bruising and swelling on your knee, calf, and ankle for 3-4 days.  Fatigue. Follow these instructions at home: If you have a brace or immobilizer:  Wear the brace or immobilizer as told by your health care provider. Remove it only as told by your health care provider.   Loosen the splint or immobilizer if your toes tingle, become numb, or turn cold and blue.  Keep the brace or immobilizer clean. Bathing  Do not take baths, swim, or use a hot tub until your health care provider approves. Ask your health care provider if you can take showers.  Keep your bandage (dressing) dry until your health care provider says that it can be removed. Cover it and your brace or immobilizer with a watertight covering when you take a shower. Incision care   Follow instructions from your health care provider about how to take care of your incision. Make sure you: ? Wash your hands with soap and water before you change your bandage (dressing). If soap and water are not available, use hand sanitizer. ? Change your dressing as told by your health care provider. ? Leave stitches (sutures), skin glue, or adhesive strips in place. These skin closures may need to stay in place for 2 weeks or longer. If adhesive strip edges start to loosen and curl up, you may trim the loose edges. Do not remove adhesive strips completely unless your health care provider tells you to do that.  Check your incision area every day for signs of infection. Check for: ? More redness, swelling, or pain. ? More fluid or blood. ?  Warmth. ? Pus or a bad smell. Managing pain, stiffness, and swelling   If directed, put ice on the affected area. ? If you have a removable brace or immobilizer, remove it as told by your health care provider. ? Put ice in a plastic bag. ? Place a towel between your skin and the bag or between your brace or immobilizer and the bag. ? Leave the ice on for 20 minutes, 2-3 times a day.  Move your toes often to avoid stiffness and to lessen swelling.  Raise (elevate) the injured area above the level of your heart while you are sitting or lying down. Driving  Do not drive until your health care provider approves. If you have a brace or immobilizer on your leg, ask your health care  provider when it is safe for you to drive.  Do not drive or use heavy machinery while taking prescription pain medicine. Activity  Rest as directed. Ask your health care provider what activities are safe for you.  Do physical therapy exercises as told by your health care provider. Physical therapy will help you regain strength and motion in your knee.  Follow instructions from your health care provider about: ? When you may start motion exercises. ? When you may start riding a stationary bike and doing other low-impact activities. ? When you may start to jog and do other high-impact activities. Safety  Do not use the injured limb to support your body weight until your health care provider says that you can. Use crutches as told by your health care provider. General instructions  Do not use any products that contain nicotine or tobacco, such as cigarettes and e-cigarettes. These can delay bone healing. If you need help quitting, ask your health care provider.  To prevent or treat constipation while you are taking prescription pain medicine, your health care provider may recommend that you: ? Drink enough fluid to keep your urine clear or pale yellow. ? Take over-the-counter or prescription medicines. ? Eat foods that are high in fiber, such as fresh fruits and vegetables, whole grains, and beans. ? Limit foods that are high in fat and processed sugars, such as fried and sweet foods.  Take over-the-counter and prescription medicines only as told by your health care provider.  Keep all follow-up visits as told by your health care provider. This is important. Contact a health care provider if:  You have more redness, swelling, or pain around an incision.  You have more fluid or blood coming from an incision.  Your incision feels warm to the touch.  You have a fever.  You have pain or swelling in your knee, and it gets worse.  You have pain that does not get better with medicine.  Get help right away if:  You have trouble breathing.  You have pus or a bad smell coming from an incision.  You have numbness and tingling near the knee joint. Summary  After the procedure, it is common to have knee pain with bruising and swelling on your knee, calf, and ankle.  Icing your knee and raising your leg above the level of your heart will help control the pain and the swelling.  Do physical therapy exercises as told by your health care provider. Physical therapy will help you regain strength and motion in your knee. This information is not intended to replace advice given to you by your health care provider. Make sure you discuss any questions you have with your health care  provider. Document Released: 12/04/2012 Document Revised: 01/26/2017 Document Reviewed: 02/08/2016 Elsevier Patient Education  2020 [place] Anesthesia, Adult, Care After This sheet gives you information about how to care for yourself after your procedure. Your health care provider may also give you more specific instructions. If you have problems or questions, contact your health care provider. What can I expect after the procedure? After the procedure, the following side effects are common:  Pain or discomfort at the IV site.  Nausea.  Vomiting.  Sore throat.  Trouble concentrating.  Feeling cold or chills.  Weak or tired.  Sleepiness and fatigue.  Soreness and body aches. These side effects can affect parts of the body that were not involved in surgery. Follow these instructions at home:  For at least 24 hours after the procedure:  Have a responsible adult stay with you. It is important to have someone help care for you until you are awake and alert.  Rest as needed.  Do not: ? Participate in activities in which you could fall or become injured. ? Drive. ? Use heavy machinery. ? Drink alcohol. ? Take sleeping pills or medicines that cause drowsiness. ? Make  important decisions or sign legal documents. ? Take care of children on your own. Eating and drinking  Follow any instructions from your health care provider about eating or drinking restrictions.  When you feel hungry, start by eating small amounts of foods that are soft and easy to digest (bland), such as toast. Gradually return to your regular diet.  Drink enough fluid to keep your urine pale yellow.  If you vomit, rehydrate by drinking water, juice, or clear broth. General instructions  If you have sleep apnea, surgery and certain medicines can increase your risk for breathing problems. Follow instructions from your health care provider about wearing your sleep device: ? Anytime you are sleeping, including during daytime naps. ? While taking prescription pain medicines, sleeping medicines, or medicines that make you drowsy.  Return to your normal activities as told by your health care provider. Ask your health care provider what activities are safe for you.  Take over-the-counter and prescription medicines only as told by your health care provider.  If you smoke, do not smoke without supervision.  Keep all follow-up visits as told by your health care provider. This is important. Contact a health care provider if:  You have nausea or vomiting that does not get better with medicine.  You cannot eat or drink without vomiting.  You have pain that does not get better with medicine.  You are unable to pass urine.  You develop a skin rash.  You have a fever.  You have redness around your IV site that gets worse. Get help right away if:  You have difficulty breathing.  You have chest pain.  You have blood in your urine or stool, or you vomit blood. Summary  After the procedure, it is common to have a sore throat or nausea. It is also common to feel tired.  Have a responsible adult stay with you for the first 24 hours after general anesthesia. It is important to have  someone help care for you until you are awake and alert.  When you feel hungry, start by eating small amounts of foods that are soft and easy to digest (bland), such as toast. Gradually return to your regular diet.  Drink enough fluid to keep your urine pale yellow.  Return to your normal activities as told by  your health care provider. Ask your health care provider what activities are safe for you. This information is not intended to replace advice given to you by your health care provider. Make sure you discuss any questions you have with your health care provider. Document Released: 05/22/2000 Document Revised: 02/16/2017 Document Reviewed: 09/29/2016 Elsevier Patient Education  2020 [employer]. How to Use Chlorhexidine for Bathing Chlorhexidine gluconate (CHG) is a germ-killing (antiseptic) solution that is used to clean the skin. It can get rid of the bacteria that normally live on the skin and can keep them away for about 24 hours. To clean your skin with CHG, you may be given:  A CHG solution to use in the shower or as part of a sponge bath.  A prepackaged cloth that contains CHG. Cleaning your skin with CHG may help lower the risk for infection:  While you are staying in the intensive care unit of the hospital.  If you have a vascular access, such as a central line, to provide short-term or long-term access to your veins.  If you have a catheter to drain urine from your bladder.  If you are on a ventilator. A ventilator is a machine that helps you breathe by moving air in and out of your lungs.  After surgery. What are the risks? Risks of using CHG include:  A skin reaction.  Hearing loss, if CHG gets in your ears.  Eye injury, if CHG gets in your eyes and is not rinsed out.  The CHG product catching fire. Make sure that you avoid smoking and flames after applying CHG to your skin. Do not use CHG:  If you have a chlorhexidine allergy or have previously reacted to  chlorhexidine.  On babies younger than 5 months of age. How to use CHG solution  Use CHG only as told by your health care provider, and follow the instructions on the label.  Use the full amount of CHG as directed. Usually, this is one bottle. During a shower Follow these steps when using CHG solution during a shower (unless your health care provider gives you different instructions): 1. Start the shower. 2. Use your normal soap and shampoo to wash your face and hair. 3. Turn off the shower or move out of the shower stream. 4. Pour the CHG onto a clean washcloth. Do not use any type of brush or rough-edged sponge. 5. Starting at your neck, lather your body down to your toes. Make sure you follow these instructions: ? If you will be having surgery, pay special attention to the part of your body where you will be having surgery. Scrub this area for at least 1 minute. ? Do not use CHG on your head or face. If the solution gets into your ears or eyes, rinse them well with water. ? Avoid your genital area. ? Avoid any areas of skin that have broken skin, cuts, or scrapes. ? Scrub your back and under your arms. Make sure to wash skin folds. 6. Let the lather sit on your skin for 1-2 minutes or as long as told by your health care provider. 7. Thoroughly rinse your entire body in the shower. Make sure that all body creases and crevices are rinsed well. 8. Dry off with a clean towel. Do not put any substances on your body afterward-such as powder, lotion, or perfume-unless you are told to do so by your health care provider. Only use lotions that are recommended by the manufacturer. 9. Put on  clean clothes or pajamas. 10. If it is the night before your surgery, sleep in clean sheets.  During a sponge bath Follow these steps when using CHG solution during a sponge bath (unless your health care provider gives you different instructions): 1. Use your normal soap and shampoo to wash your face and hair.  2. Pour the CHG onto a clean washcloth. 3. Starting at your neck, lather your body down to your toes. Make sure you follow these instructions: ? If you will be having surgery, pay special attention to the part of your body where you will be having surgery. Scrub this area for at least 1 minute. ? Do not use CHG on your head or face. If the solution gets into your ears or eyes, rinse them well with water. ? Avoid your genital area. ? Avoid any areas of skin that have broken skin, cuts, or scrapes. ? Scrub your back and under your arms. Make sure to wash skin folds. 4. Let the lather sit on your skin for 1-2 minutes or as long as told by your health care provider. 5. Using a different clean, wet washcloth, thoroughly rinse your entire body. Make sure that all body creases and crevices are rinsed well. 6. Dry off with a clean towel. Do not put any substances on your body afterward-such as powder, lotion, or perfume-unless you are told to do so by your health care provider. Only use lotions that are recommended by the manufacturer. 7. Put on clean clothes or pajamas. 8. If it is the night before your surgery, sleep in clean sheets. How to use CHG prepackaged cloths  Only use CHG cloths as told by your health care provider, and follow the instructions on the label.  Use the CHG cloth on clean, dry skin.  Do not use the CHG cloth on your head or face unless your health care provider tells you to.  When washing with the CHG cloth: ? Avoid your genital area. ? Avoid any areas of skin that have broken skin, cuts, or scrapes. Before surgery Follow these steps when using a CHG cloth to clean before surgery (unless your health care provider gives you different instructions): 1. Using the CHG cloth, vigorously scrub the part of your body where you will be having surgery. Scrub using a back-and-forth motion for 3 minutes. The area on your body should be completely wet with CHG when you are done  scrubbing. 2. Do not rinse. Discard the cloth and let the area air-dry. Do not put any substances on the area afterward, such as powder, lotion, or perfume. 3. Put on clean clothes or pajamas. 4. If it is the night before your surgery, sleep in clean sheets.  For general bathing Follow these steps when using CHG cloths for general bathing (unless your health care provider gives you different instructions). 1. Use a separate CHG cloth for each area of your body. Make sure you wash between any folds of skin and between your fingers and toes. Wash your body in the following order, switching to a new cloth after each step: ? The front of your neck, shoulders, and chest. ? Both of your arms, under your arms, and your hands. ? Your stomach and groin area, avoiding the genitals. ? Your right leg and foot. ? Your left leg and foot. ? The back of your neck, your back, and your buttocks. 2. Do not rinse. Discard the cloth and let the area air-dry. Do not put any substances on  your body afterward-such as powder, lotion, or perfume-unless you are told to do so by your health care provider. Only use lotions that are recommended by the manufacturer. 3. Put on clean clothes or pajamas. Contact a health care provider if:  Your skin gets irritated after scrubbing.  You have questions about using your solution or cloth. Get help right away if:  Your eyes become very red or swollen.  Your eyes itch badly.  Your skin itches badly and is red or swollen.  Your hearing changes.  You have trouble seeing.  You have swelling or tingling in your mouth or throat.  You have trouble breathing.  You swallow any chlorhexidine. Summary  Chlorhexidine gluconate (CHG) is a germ-killing (antiseptic) solution that is used to clean the skin. Cleaning your skin with CHG may help to lower your risk for infection.  You may be given CHG to use for bathing. It may be in a bottle or in a prepackaged cloth to use on  your skin. Carefully follow your health care provider's instructions and the instructions on the product label.  Do not use CHG if you have a chlorhexidine allergy.  Contact your health care provider if your skin gets irritated after scrubbing. This information is not intended to replace advice given to you by your health care provider. Make sure you discuss any questions you have with your health care provider. Document Released: 11/08/2011 Document Revised: 05/02/2018 Document Reviewed: 01/11/2017 Elsevier Patient Education  2020 [employer].

## 2019-01-07 ENCOUNTER — Other Ambulatory Visit: Payer: Self-pay

## 2019-01-07 ENCOUNTER — Encounter (HOSPITAL_COMMUNITY)
Admission: RE | Admit: 2019-01-07 | Discharge: 2019-01-07 | Disposition: A | Discharge status: Home / Self Care | Payer: Medicaid Other | Source: Ambulatory Visit | Attending: Orthopedic Surgery | Admitting: Orthopedic Surgery

## 2019-01-07 ENCOUNTER — Encounter (HOSPITAL_COMMUNITY): Payer: Self-pay

## 2019-01-07 ENCOUNTER — Other Ambulatory Visit (HOSPITAL_COMMUNITY)
Admission: RE | Admit: 2019-01-07 | Discharge: 2019-01-07 | Disposition: A | Discharge status: Home / Self Care | Payer: Medicaid Other | Source: Ambulatory Visit | Attending: Orthopedic Surgery | Admitting: Orthopedic Surgery

## 2019-01-07 DIAGNOSIS — Z01812 Encounter for preprocedural laboratory examination: Secondary | ICD-10-CM | POA: Insufficient documentation

## 2019-01-07 LAB — CBC WITH DIFFERENTIAL/PLATELET
Abs Immature Granulocytes: 0.02 10*3/uL (ref 0.00–0.07)
Basophils Absolute: 0.1 10*3/uL (ref 0.0–0.1)
Basophils Relative: 1 %
Eosinophils Absolute: 0 10*3/uL (ref 0.0–0.5)
Eosinophils Relative: 0 %
HCT: 45.3 % (ref 36.0–46.0)
Hemoglobin: 14.5 g/dL (ref 12.0–15.0)
Immature Granulocytes: 0 %
Lymphocytes Relative: 22 %
Lymphs Abs: 2.3 10*3/uL (ref 0.7–4.0)
MCH: 30 pg (ref 26.0–34.0)
MCHC: 32 g/dL (ref 30.0–36.0)
MCV: 93.6 fL (ref 80.0–100.0)
Monocytes Absolute: 0.6 10*3/uL (ref 0.1–1.0)
Monocytes Relative: 6 %
Neutro Abs: 7.5 10*3/uL (ref 1.7–7.7)
Neutrophils Relative %: 71 %
Platelets: 188 10*3/uL (ref 150–400)
RBC: 4.84 MIL/uL (ref 3.87–5.11)
RDW: 15.4 % (ref 11.5–15.5)
WBC: 10.5 10*3/uL (ref 4.0–10.5)
nRBC: 0 % (ref 0.0–0.2)

## 2019-01-07 LAB — BASIC METABOLIC PANEL
Anion gap: 12 (ref 5–15)
BUN: 12 mg/dL (ref 6–20)
CO2: 27 mmol/L (ref 22–32)
Calcium: 9.4 mg/dL (ref 8.9–10.3)
Chloride: 100 mmol/L (ref 98–111)
Creatinine, Ser: 0.91 mg/dL (ref 0.44–1.00)
GFR calc Af Amer: >60 mL/min (ref >60)
GFR calc non Af Amer: >60 mL/min (ref >60)
Glucose, Bld: 100 mg/dL — ABNORMAL HIGH (ref 70–99)
Potassium: 3.7 mmol/L (ref 3.5–5.1)
Sodium: 139 mmol/L (ref 135–145)

## 2019-01-07 LAB — SARS CORONAVIRUS 2 (TAT 6-24 HRS): SARS Coronavirus 2: NEGATIVE

## 2019-01-07 NOTE — Progress Notes (Signed)
   01/07/19 1002  OBSTRUCTIVE SLEEP APNEA  Have you ever been diagnosed with sleep apnea through a sleep study? No  Do you snore loudly (loud enough to be heard through closed doors)?  1  Do you often feel tired, fatigued, or sleepy during the daytime (such as falling asleep during driving or talking to someone)? 1  Has anyone observed you stop breathing during your sleep? 1  Do you have, or are you being treated for high blood pressure? 1  BMI more than 35 kg/m2? 1  Age > 50 (1-yes) 0  Neck circumference greater than:Female 16 inches or larger, Female 17inches or larger? 1  Female Gender (Yes=1) 0  Obstructive Sleep Apnea Score 6

## 2019-01-08 NOTE — H&P (Signed)
PREOP CONSULT/REFERRAL INTRA-OFFICE FROM DR Tyrone Apple         Chief Complaint  Patient presents with  . Knee Pain      left x 1 year surgical consult       Whitney Bush is 48 years old she has had multiple injuries to this left knee and has been treated medically without much improvement.  She complains of severe pain in the left knee which is intermittent.  She has had longstanding symptoms and she associates giving out symptoms especially with turning and then the knee will pop back and on its own it intermittently swells also.   She is employed as a International aid/development worker and has no other vigorous activities     Review of Systems  Constitutional: Negative for fever.  Respiratory: Negative for shortness of breath.   Cardiovascular: Negative for chest pain.  Musculoskeletal: Positive for back pain and joint pain.  Neurological: Negative for sensory change and focal weakness.  All other systems reviewed and are negative.           Past Medical History:  Diagnosis Date  . Alcohol abuse    . 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 Anemia    . Blood transfusion without reported diagnosis    . Boil 06/04/2015  . Chronic kidney disease    . Clotting disorder ([place])    . COPD (chronic obstructive pulmonary disease) ([place])    . Depression    . GERD (gastroesophageal reflux disease)    . GI (gastrointestinal bleed) 07/28/2015  . Heart disease    . Heart murmur    . Hyperlipidemia    . Hypertension    . Hypothyroidism    . Migraines    . OCD (obsessive compulsive disorder)    . Peripheral edema    . PTSD (post-traumatic stress disorder)    . Tubular adenoma             Past Surgical History:  Procedure Laterality Date  . ABCESS DRAINAGE        x5; neck, arm, chest, back  . BIOPSY   02/24/2015    Procedure: BIOPSY;  Surgeon: Danie Binder, MD;  [facility]: [facility];  Service: Endoscopy;;  gastric bx's  . BIOPSY   12/25/2016    Procedure: BIOPSY;   Surgeon: Daneil Dolin, MD;  [facility]: [facility];  Service: Endoscopy;;  gastric  . CESAREAN SECTION      . COLONOSCOPY WITH PROPOFOL N/A 06/28/2015    Dr.Rourk- inadequate prep- One 6 mm polyp at the splenic flexure bx= tubular adenoma  . ESOPHAGEAL BANDING N/A 12/25/2016    Procedure: ESOPHAGEAL BANDING with Propofol;  Surgeon: Daneil Dolin, MD;  [facility]: [facility];  Service: Endoscopy;  Laterality: N/A;  possible banding of varices  . ESOPHAGOGASTRODUODENOSCOPY (EGD) WITH PROPOFOL N/A 02/24/2015    SLF: Grade II esophageal varices Moderate portal hypertensive gastropathy Mild erosive gastritis anemia likely due to many factors: gastritis, gastropathy, coagulopathy, chronic disease  . ESOPHAGOGASTRODUODENOSCOPY (EGD) WITH PROPOFOL N/A 12/25/2016    Procedure: ESOPHAGOGASTRODUODENOSCOPY (EGD) WITH PROPOFOL;  Surgeon: Daneil Dolin, MD;  [facility]: [facility];  Service: Endoscopy;  Laterality: N/A;  8:45am  . MALONEY DILATION N/A 12/25/2016    Procedure: Venia Minks DILATION;  Surgeon: Daneil Dolin, MD;  [facility]: [facility];  Service: Endoscopy;  Laterality: N/A;  . POLYPECTOMY   06/28/2015  Procedure: POLYPECTOMY;  Surgeon: Daneil Dolin, MD;  [facility]: [facility];  Service: Endoscopy;;  at splenic flexure  . SUPRACERVICAL ABDOMINAL HYSTERECTOMY N/A 10/02/2017    Procedure: HYSTERECTOMY SUPRACERVICAL ABDOMINAL;  Surgeon: Jonnie Kind, MD;  Location: [facility];  Service: Gynecology;  Laterality: N/A;           Family History  Problem Relation Age of Onset  . Diabetes Father    . Hyperlipidemia Father    . Alcohol abuse Father    . Hypertension Father    . Cancer Other    . Cervical cancer Maternal Grandmother    . Lung cancer Maternal Grandfather    . Alcohol abuse Other          multiple family members  . Diabetes Sister    . Hypertension Sister    . Hypertension Brother    . Colon cancer Neg Hx    . Liver disease Neg Hx      Social History          Tobacco Use  . Smoking status: Former Smoker      Packs/day: 1.00      Years: 5.00      Pack years: 5.00      Types: Cigarettes      Quit date: 12/29/2010      Years since quitting: 7.9  . Smokeless tobacco: Former Systems developer      Types: Snuff      Quit date: 09/28/2010  Substance Use Topics  . Alcohol use: Yes      Comment: weekends  . Drug use: No      No Known Allergies     Active Medications      Current Meds  Medication Sig  . albuterol (PROVENTIL HFA;VENTOLIN HFA) 108 (90 Base) MCG/ACT inhaler Inhale 1-2 puffs into the lungs every 6 (six) hours as needed for wheezing or shortness of breath.  . B Complex-C (SUPER B COMPLEX PO) Take 1 capsule by mouth daily.   . chlordiazePOXIDE (LIBRIUM) 10 MG capsule Take 1 capsule (10 mg total) by mouth 3 (three) times daily as needed for anxiety or withdrawal.  . FLUoxetine (PROZAC) 20 MG capsule Take 20 mg by mouth daily.  . furosemide (LASIX) 40 MG tablet Take 1 tablet (40 mg total) by mouth daily.  Marland Kitchen lactulose (CONSTULOSE) 10 GM/15ML solution TAKE 30 MLS BY MOUTH TWICE DAILY AS NEEDED FOR MILD CONSTIPATION OR MODERATE CONSTIPATION.  Marland Kitchen propranolol (INDERAL) 20 MG tablet Take 1 tablet (20 mg total) by mouth 2 (two) times daily.  Marland Kitchen spironolactone (ALDACTONE) 50 MG tablet Take 1 tablet (50 mg total) by mouth daily.  . traZODone (DESYREL) 100 MG tablet Take 100 mg by mouth daily.       BP 117/83   Pulse 77   Ht 5\' 3"  (1.6 m)   Wt 238 lb (108 kg)   LMP 09/12/2017 (Exact Date) Comment:    BMI 42.16 kg/m    Physical Exam   The patient's body habitus is endomorphic her nutrition looks good her development is normal she has no gross deformities she is well-groomed   There are no peripheral varicose veins or swelling the pulses and temperature are good there is no edema or tenderness   The groin area shows no lymph node enlargement   She walks with a noticeable limp   Her upper extremities are well aligned without contracture  subluxation atrophy or tremor and the skin is normal   In the  right knee we find normal alignment with no tenderness we find a stable knee with normal range of motion good strength and muscle tone   On the left knee we find a tender medial and lateral compartment with stable patella questionable laxity in the ACL normal PCL normal collateral ligaments no effusion she still has full range of motion normal strength and muscle tone   MEDICAL DECISION SECTION  xrays ordered?  No   My independent reading of xrays: MRI ACL does look deficient as indicated in the report and she has meniscal tear as well   IMPRESSION: Large flap tear posterior horn medial meniscus with a fragment flipped along the posterior margin of the medial side of the intercondylar notch. The body of the medial meniscus is severely degenerated and diminutive.   Large radial tear or flap tear along the free edge of the posterior horn of the lateral meniscus at and just peripheral to the meniscal root.   Grossly abnormal ACL could be due to severe mucoid degeneration or remote tear. The knee is likely ACL deficient.   Small T1 and T2 hypointense fragment anterior to the anterior horn lateral meniscus could be hyaline cartilage or meniscal fragment.   Large focus of hypertrophied synovium anterior to the expected location of the ACL.     Electronically Signed   By: Inge Rise M.D.   On: 11/27/2018 14:12           Encounter Diagnoses  Name Primary?  . Derangement of posterior horn of medial meniscus of left knee Yes  . Lateral meniscus derangement, left    . Laxity of left anterior cruciate ligament          PLAN:    Surgical procedure planned: Arthroscopy left knee medial lateral meniscectomy exam under anesthesia with evaluation intraoperatively of the ACL     The procedure has been fully reviewed with the patient; we also discussed the fact that I cannot definitively tell if her ACL is torn unless  I look at it and probe it and evaluate her and anesthesia.  She says she understands this.  On this particular procedure which is going into clean up everything in terms of the tears.  She is low demand so most of the time these patients can survive without ACL surgery but we certainly would proceed in another sitting if necessary   The risks and benefits of surgery have been discussed and explained and understood. Alternative treatment has also been reviewed, questions were encouraged and answered. The postoperative plan is also been reviewed.   Nonsurgical treatment as described in the history and physical section was attempted and unsuccessful and the patient has agreed to proceed with surgical intervention to improve their situation.   No orders of the defined types were placed in this encounter.     Arther Abbott, MD

## 2019-01-09 ENCOUNTER — Encounter (HOSPITAL_COMMUNITY): Payer: Self-pay | Admitting: *Deleted

## 2019-01-09 ENCOUNTER — Ambulatory Visit (HOSPITAL_COMMUNITY): Payer: Medicaid Other | Admitting: Anesthesiology

## 2019-01-09 ENCOUNTER — Encounter (HOSPITAL_COMMUNITY)
Admission: RE | Disposition: A | Discharge status: Home / Self Care | Payer: Self-pay | Source: Home / Self Care | Attending: Orthopedic Surgery

## 2019-01-09 ENCOUNTER — Ambulatory Visit (HOSPITAL_COMMUNITY)
Admission: RE | Admit: 2019-01-09 | Discharge: 2019-01-09 | Disposition: A | Discharge status: Home / Self Care | Payer: Medicaid Other | Attending: Orthopedic Surgery | Admitting: Orthopedic Surgery

## 2019-01-09 DIAGNOSIS — K766 Portal hypertension: Secondary | ICD-10-CM | POA: Insufficient documentation

## 2019-01-09 DIAGNOSIS — N189 Chronic kidney disease, unspecified: Secondary | ICD-10-CM | POA: Insufficient documentation

## 2019-01-09 DIAGNOSIS — K7030 Alcoholic cirrhosis of liver without ascites: Secondary | ICD-10-CM | POA: Diagnosis not present

## 2019-01-09 DIAGNOSIS — J449 Chronic obstructive pulmonary disease, unspecified: Secondary | ICD-10-CM | POA: Insufficient documentation

## 2019-01-09 DIAGNOSIS — S83242D Other tear of medial meniscus, current injury, left knee, subsequent encounter: Secondary | ICD-10-CM | POA: Diagnosis not present

## 2019-01-09 DIAGNOSIS — X58XXXA Exposure to other specified factors, initial encounter: Secondary | ICD-10-CM | POA: Diagnosis not present

## 2019-01-09 DIAGNOSIS — I129 Hypertensive chronic kidney disease with stage 1 through stage 4 chronic kidney disease, or unspecified chronic kidney disease: Secondary | ICD-10-CM | POA: Diagnosis not present

## 2019-01-09 DIAGNOSIS — F429 Obsessive-compulsive disorder, unspecified: Secondary | ICD-10-CM | POA: Diagnosis not present

## 2019-01-09 DIAGNOSIS — F329 Major depressive disorder, single episode, unspecified: Secondary | ICD-10-CM | POA: Insufficient documentation

## 2019-01-09 DIAGNOSIS — M23222 Derangement of posterior horn of medial meniscus due to old tear or injury, left knee: Secondary | ICD-10-CM | POA: Diagnosis not present

## 2019-01-09 DIAGNOSIS — M23252 Derangement of posterior horn of lateral meniscus due to old tear or injury, left knee: Secondary | ICD-10-CM | POA: Insufficient documentation

## 2019-01-09 DIAGNOSIS — S83512A Sprain of anterior cruciate ligament of left knee, initial encounter: Secondary | ICD-10-CM | POA: Diagnosis not present

## 2019-01-09 DIAGNOSIS — Z8249 Family history of ischemic heart disease and other diseases of the circulatory system: Secondary | ICD-10-CM | POA: Diagnosis not present

## 2019-01-09 DIAGNOSIS — M23201 Derangement of unspecified lateral meniscus due to old tear or injury, left knee: Secondary | ICD-10-CM | POA: Diagnosis not present

## 2019-01-09 DIAGNOSIS — F4310 Post-traumatic stress disorder, unspecified: Secondary | ICD-10-CM | POA: Diagnosis not present

## 2019-01-09 DIAGNOSIS — F419 Anxiety disorder, unspecified: Secondary | ICD-10-CM | POA: Insufficient documentation

## 2019-01-09 DIAGNOSIS — Z79899 Other long term (current) drug therapy: Secondary | ICD-10-CM | POA: Insufficient documentation

## 2019-01-09 DIAGNOSIS — S83242A Other tear of medial meniscus, current injury, left knee, initial encounter: Secondary | ICD-10-CM

## 2019-01-09 DIAGNOSIS — S83512D Sprain of anterior cruciate ligament of left knee, subsequent encounter: Secondary | ICD-10-CM | POA: Diagnosis not present

## 2019-01-09 DIAGNOSIS — Z87891 Personal history of nicotine dependence: Secondary | ICD-10-CM | POA: Insufficient documentation

## 2019-01-09 HISTORY — PX: KNEE ARTHROSCOPY WITH MEDIAL MENISECTOMY: SHX5651

## 2019-01-09 SURGERY — ARTHROSCOPY, KNEE, WITH MEDIAL MENISCECTOMY
Anesthesia: General | Site: Knee | Laterality: Left

## 2019-01-09 MED ORDER — KETOROLAC TROMETHAMINE 15 MG/ML IJ SOLN
15.0000 mg | Freq: Once | INTRAMUSCULAR | Status: AC
  Administered 2019-01-09: 15 mg via INTRAVENOUS
  Filled 2019-01-09: qty 1

## 2019-01-09 MED ORDER — SODIUM CHLORIDE 0.9 % IR SOLN
Status: DC | PRN
  Administered 2019-01-09 (×4): 3000 mL

## 2019-01-09 MED ORDER — LACTATED RINGERS IV SOLN
INTRAVENOUS | Status: DC
  Administered 2019-01-09: 07:00:00 via INTRAVENOUS

## 2019-01-09 MED ORDER — SUCCINYLCHOLINE CHLORIDE 200 MG/10ML IV SOSY
PREFILLED_SYRINGE | INTRAVENOUS | Status: AC
  Filled 2019-01-09: qty 10

## 2019-01-09 MED ORDER — PROPOFOL 10 MG/ML IV BOLUS
INTRAVENOUS | Status: DC | PRN
  Administered 2019-01-09: 40 mg via INTRAVENOUS
  Administered 2019-01-09 (×2): 50 mg via INTRAVENOUS
  Administered 2019-01-09: 200 mg via INTRAVENOUS

## 2019-01-09 MED ORDER — BUPIVACAINE-EPINEPHRINE (PF) 0.5% -1:200000 IJ SOLN
INTRAMUSCULAR | Status: AC
  Filled 2019-01-09: qty 60

## 2019-01-09 MED ORDER — GLYCOPYRROLATE PF 0.2 MG/ML IJ SOSY
PREFILLED_SYRINGE | INTRAMUSCULAR | Status: AC
  Filled 2019-01-09: qty 1

## 2019-01-09 MED ORDER — DEXAMETHASONE SODIUM PHOSPHATE 10 MG/ML IJ SOLN
INTRAMUSCULAR | Status: AC
  Filled 2019-01-09: qty 1

## 2019-01-09 MED ORDER — BUPIVACAINE-EPINEPHRINE (PF) 0.5% -1:200000 IJ SOLN
INTRAMUSCULAR | Status: DC | PRN
  Administered 2019-01-09: 60 mL via PERINEURAL

## 2019-01-09 MED ORDER — FENTANYL CITRATE (PF) 250 MCG/5ML IJ SOLN
INTRAMUSCULAR | Status: AC
  Filled 2019-01-09: qty 5

## 2019-01-09 MED ORDER — LIDOCAINE HCL (CARDIAC) PF 100 MG/5ML IV SOSY
PREFILLED_SYRINGE | INTRAVENOUS | Status: DC | PRN
  Administered 2019-01-09: 80 mg via INTRAVENOUS

## 2019-01-09 MED ORDER — MIDAZOLAM HCL 2 MG/2ML IJ SOLN: 0.5000 mg | Freq: Once | INTRAMUSCULAR | Status: DC | PRN

## 2019-01-09 MED ORDER — PROMETHAZINE HCL 12.5 MG PO TABS: 12.5000 mg | ORAL_TABLET | Freq: Four times a day (QID) | ORAL | 0 refills | Status: DC | PRN

## 2019-01-09 MED ORDER — HYDROCODONE-ACETAMINOPHEN 7.5-325 MG PO TABS
1.0000 | ORAL_TABLET | Freq: Once | ORAL | Status: AC
  Administered 2019-01-09: 1 via ORAL
  Filled 2019-01-09: qty 1

## 2019-01-09 MED ORDER — MIDAZOLAM HCL 2 MG/2ML IJ SOLN
INTRAMUSCULAR | Status: DC | PRN
  Administered 2019-01-09 (×2): 1 mg via INTRAVENOUS

## 2019-01-09 MED ORDER — CEFAZOLIN SODIUM-DEXTROSE 2-4 GM/100ML-% IV SOLN
2.0000 g | INTRAVENOUS | Status: AC
  Administered 2019-01-09: 2 g via INTRAVENOUS
  Filled 2019-01-09: qty 100

## 2019-01-09 MED ORDER — DEXAMETHASONE SODIUM PHOSPHATE 4 MG/ML IJ SOLN
INTRAMUSCULAR | Status: DC | PRN
  Administered 2019-01-09: 8 mg via INTRAVENOUS

## 2019-01-09 MED ORDER — HYDROMORPHONE HCL 1 MG/ML IJ SOLN: 0.2500 mg | INTRAMUSCULAR | Status: DC | PRN

## 2019-01-09 MED ORDER — HYDROCODONE-ACETAMINOPHEN 5-325 MG PO TABS: 1.0000 | ORAL_TABLET | ORAL | 0 refills | Status: DC | PRN

## 2019-01-09 MED ORDER — ONDANSETRON HCL 4 MG/2ML IJ SOLN
4.0000 mg | Freq: Once | INTRAMUSCULAR | Status: AC
  Administered 2019-01-09: 4 mg via INTRAVENOUS
  Filled 2019-01-09: qty 2

## 2019-01-09 MED ORDER — PROMETHAZINE HCL 25 MG/ML IJ SOLN: 6.2500 mg | INTRAMUSCULAR | Status: DC | PRN

## 2019-01-09 MED ORDER — PROPOFOL 10 MG/ML IV BOLUS
INTRAVENOUS | Status: AC
  Filled 2019-01-09: qty 60

## 2019-01-09 MED ORDER — MIDAZOLAM HCL 2 MG/2ML IJ SOLN
INTRAMUSCULAR | Status: AC
  Filled 2019-01-09: qty 2

## 2019-01-09 MED ORDER — FENTANYL CITRATE (PF) 100 MCG/2ML IJ SOLN
INTRAMUSCULAR | Status: DC | PRN
  Administered 2019-01-09: 50 ug via INTRAVENOUS
  Administered 2019-01-09: 25 ug via INTRAVENOUS
  Administered 2019-01-09: 50 ug via INTRAVENOUS
  Administered 2019-01-09: 25 ug via INTRAVENOUS

## 2019-01-09 MED ORDER — LIDOCAINE 2% (20 MG/ML) 5 ML SYRINGE
INTRAMUSCULAR | Status: AC
  Filled 2019-01-09: qty 5

## 2019-01-09 MED ORDER — CHLORHEXIDINE GLUCONATE 4 % EX LIQD: 60.0000 mL | Freq: Once | CUTANEOUS | Status: DC

## 2019-01-09 MED ORDER — CEFAZOLIN SODIUM-DEXTROSE 2-4 GM/100ML-% IV SOLN
INTRAVENOUS | Status: AC
  Filled 2019-01-09: qty 100

## 2019-01-09 MED ORDER — ONDANSETRON HCL 4 MG/2ML IJ SOLN
INTRAMUSCULAR | Status: DC | PRN
  Administered 2019-01-09: 4 mg via INTRAVENOUS

## 2019-01-09 MED ORDER — HYDROCODONE-ACETAMINOPHEN 7.5-325 MG PO TABS: 1.0000 | ORAL_TABLET | Freq: Once | ORAL | Status: DC | PRN

## 2019-01-09 MED ORDER — 0.9 % SODIUM CHLORIDE (POUR BTL) OPTIME
TOPICAL | Status: DC | PRN
  Administered 2019-01-09: 1000 mL

## 2019-01-09 MED ORDER — ONDANSETRON HCL 4 MG/2ML IJ SOLN
INTRAMUSCULAR | Status: AC
  Filled 2019-01-09: qty 2

## 2019-01-09 SURGICAL SUPPLY — 51 items
APL PRP STRL LF DISP 70% ISPRP (MISCELLANEOUS) ×1
BANDAGE ELASTIC 6 VELCRO NS (GAUZE/BANDAGES/DRESSINGS) ×3 IMPLANT
BLADE AGGRESSIVE PLUS 4.0 (BLADE) ×3 IMPLANT
BLADE SURG SZ11 CARB STEEL (BLADE) ×3 IMPLANT
CHLORAPREP W/TINT 26 (MISCELLANEOUS) ×3 IMPLANT
CLOTH BEACON ORANGE TIMEOUT ST (SAFETY) ×3 IMPLANT
COOLER CRYO IC GRAV AND TUBE (ORTHOPEDIC SUPPLIES) ×3 IMPLANT
COVER WAND RF STERILE (DRAPES) ×3 IMPLANT
CUFF CRYO KNEE18X23 MED (MISCELLANEOUS) ×2 IMPLANT
CUFF TOURN SGL QUICK 34 (TOURNIQUET CUFF) ×3
CUFF TRNQT CYL 34X4.125X (TOURNIQUET CUFF) IMPLANT
DECANTER SPIKE VIAL GLASS SM (MISCELLANEOUS) ×6 IMPLANT
GAUZE 4X4 16PLY RFD (DISPOSABLE) ×3 IMPLANT
GAUZE SPONGE 4X4 12PLY STRL (GAUZE/BANDAGES/DRESSINGS) ×3 IMPLANT
GAUZE SPONGE 4X4 16PLY XRAY LF (GAUZE/BANDAGES/DRESSINGS) ×3 IMPLANT
GAUZE XEROFORM 5X9 LF (GAUZE/BANDAGES/DRESSINGS) ×3 IMPLANT
GLOVE BIO SURGEON STRL SZ7 (GLOVE) ×2 IMPLANT
GLOVE BIOGEL PI IND STRL 7.0 (GLOVE) ×2 IMPLANT
GLOVE BIOGEL PI INDICATOR 7.0 (GLOVE) ×6
GLOVE SKINSENSE NS SZ8.0 LF (GLOVE) ×2
GLOVE SKINSENSE STRL SZ8.0 LF (GLOVE) ×1 IMPLANT
GLOVE SS N UNI LF 8.5 STRL (GLOVE) ×3 IMPLANT
GOWN STRL REUS W/ TWL LRG LVL3 (GOWN DISPOSABLE) ×1 IMPLANT
GOWN STRL REUS W/TWL LRG LVL3 (GOWN DISPOSABLE) ×3
GOWN STRL REUS W/TWL XL LVL3 (GOWN DISPOSABLE) ×3 IMPLANT
IV NS IRRIG 3000ML ARTHROMATIC (IV SOLUTION) ×10 IMPLANT
KIT BLADEGUARD II DBL (SET/KITS/TRAYS/PACK) ×3 IMPLANT
KIT TURNOVER CYSTO (KITS) ×3 IMPLANT
MANIFOLD NEPTUNE II (INSTRUMENTS) ×3 IMPLANT
MARKER SKIN DUAL TIP RULER LAB (MISCELLANEOUS) ×3 IMPLANT
NDL HYPO 18GX1.5 BLUNT FILL (NEEDLE) ×1 IMPLANT
NDL HYPO 21X1.5 SAFETY (NEEDLE) ×1 IMPLANT
NDL SPNL 18GX3.5 QUINCKE PK (NEEDLE) ×1 IMPLANT
NEEDLE HYPO 18GX1.5 BLUNT FILL (NEEDLE) ×3 IMPLANT
NEEDLE HYPO 21X1.5 SAFETY (NEEDLE) ×3 IMPLANT
NEEDLE SPNL 18GX3.5 QUINCKE PK (NEEDLE) ×3 IMPLANT
NS IRRIG 1000ML POUR BTL (IV SOLUTION) ×3 IMPLANT
PACK ARTHRO LIMB DRAPE STRL (MISCELLANEOUS) ×3 IMPLANT
PAD ABD 5X9 TENDERSORB (GAUZE/BANDAGES/DRESSINGS) ×3 IMPLANT
PAD ARMBOARD 7.5X6 YLW CONV (MISCELLANEOUS) ×3 IMPLANT
PADDING CAST COTTON 6X4 STRL (CAST SUPPLIES) ×3 IMPLANT
PADDING WEBRIL 6 STERILE (GAUZE/BANDAGES/DRESSINGS) ×2 IMPLANT
PROBE BIPOLAR 50 DEGREE SUCT (MISCELLANEOUS) ×2 IMPLANT
SET ARTHROSCOPY INST (INSTRUMENTS) ×3 IMPLANT
SET BASIN LINEN APH (SET/KITS/TRAYS/PACK) ×3 IMPLANT
SUT ETHILON 3 0 FSL (SUTURE) ×3 IMPLANT
SYR 10ML LL (SYRINGE) ×3 IMPLANT
SYR 30ML LL (SYRINGE) ×3 IMPLANT
TUBE CONNECTING 12'X1/4 (SUCTIONS) ×2
TUBE CONNECTING 12X1/4 (SUCTIONS) ×4 IMPLANT
TUBING ARTHRO INFLOW-ONLY STRL (TUBING) ×3 IMPLANT

## 2019-01-09 NOTE — Interval H&P Note (Signed)
History and Physical Interval Note:  01/09/2019 7:18 AM  Whitney Bush  has presented today for surgery, with the diagnosis of torn medial and lateral meniscus left knee.  The various methods of treatment have been discussed with the patient and family. After consideration of risks, benefits and other options for treatment, the patient has consented to  Procedure(s): KNEE ARTHROSCOPY WITH MEDIAL MENISCECTOMY AND LATERAL MENISCECTOMY (Left) as a surgical intervention.  The patient's history has been reviewed, patient examined, no change in status, stable for surgery.  I have reviewed the patient's chart and labs.  Questions were answered to the patient's satisfaction.     Arther Abbott

## 2019-01-09 NOTE — Anesthesia Preprocedure Evaluation (Addendum)
Anesthesia Evaluation  Patient identified by MRN, date of birth, ID band Patient awake    Reviewed: Allergy & Precautions, NPO status , Patient's Chart, lab work & pertinent test results  Airway Mallampati: II  TM Distance: >3 FB Neck ROM: Full    Dental no notable dental hx. (+) Teeth Intact   Pulmonary COPD, former smoker,    Pulmonary exam normal breath sounds clear to auscultation       Cardiovascular Exercise Tolerance: Good hypertension, Pt. on medications negative cardio ROS Normal cardiovascular examI Rhythm:Regular Rate:Normal     Neuro/Psych  Headaches, Anxiety Depression Reports PTSD on inderal negative psych ROS   GI/Hepatic GERD  ,(+) Cirrhosis     substance abuse  alcohol use, Drank EToH yesterday    Endo/Other  Morbid obesityStates no thyroid meds in the last two days  No meds listed on record  Very evasive when answering questions   Renal/GU Renal diseasenegative Renal ROSNormal CrCl  negative genitourinary   Musculoskeletal negative musculoskeletal ROS (+)   Abdominal   Peds negative pediatric ROS (+)  Hematology negative hematology ROS (+)   Anesthesia Other Findings   Reproductive/Obstetrics negative OB ROS                            Anesthesia Physical Anesthesia Plan  ASA: IV  Anesthesia Plan: General   Post-op Pain Management:    Induction: Intravenous  PONV Risk Score and Plan: 3 and Midazolam, Ondansetron, Treatment may vary due to age or medical condition and Dexamethasone  Airway Management Planned: Oral ETT  Additional Equipment:   Intra-op Plan:   Post-operative Plan: Extubation in OR  Informed Consent: I have reviewed the patients History and Physical, chart, labs and discussed the procedure including the risks, benefits and alternatives for the proposed anesthesia with the patient or authorized representative who has indicated his/her  understanding and acceptance.     Dental advisory given  Plan Discussed with: CRNA  Anesthesia Plan Comments: (Plan Full PPE use Plan GETA D/W PT -WTP with same after Q&A)        Anesthesia Quick Evaluation

## 2019-01-09 NOTE — Brief Op Note (Signed)
01/09/2019  8:43 AM  PATIENT:  Whitney Bush  48 y.o. female  PRE-OPERATIVE DIAGNOSIS:  torn medial and lateral meniscus left knee  POST-OPERATIVE DIAGNOSIS:  torn medial and lateral meniscus left knee, partial ACL tear  PROCEDURE:  Procedure(s): KNEE ARTHROSCOPY WITH MEDIAL MENISCECTOMY AND LATERAL MENISCECTOMY (Left)  Findings preop anterior drawer 1+ with a positive Lachman 1 pivot Intra-Op partial tear anteromedial bundle  Posterior horn medial meniscus tear with meniscal fragment flipped into the notch and then a posterior horn lateral meniscus tear parrot-beak chondral surfaces throughout the joint normal   SURGEON:  Surgeon(s) and Role:    Carole Civil, MD - Primary  Knee arthroscopy dictation  The patient was identified in the preoperative holding area using 2 approved identification mechanisms. The chart was reviewed and updated. The surgical site was confirmed as left knee and marked with an indelible marker.  The patient was taken to the operating room for anesthesia. After successful general anesthesia, Ancef was used as IV antibiotics.  The patient was placed in the supine position with the (left) the operative extremity in an arthroscopic leg holder and the opposite extremity in a padded leg holder.  The timeout was executed.  A lateral portal was established with an 11 blade and the scope was introduced into the joint. A diagnostic arthroscopy was performed in circumferential manner examining the entire knee joint. A medial portal was established and the diagnostic arthroscopy was repeated using a probe to palpate intra-articular structures as they were encountered.     The medial meniscus was resected using a motorized shaver. The meniscus was balanced with a combination of a motorized shaver and a 50 ArthroCare wand until a stable rim was obtained.  The lateral meniscus was resected using a combination of instruments including the 50 degree  ArthroCare wand  A second look and probing of the ACL indicated that it was probably the anteromedial bundle that was partially torn the lateral wall did not show an empty wall sign and the fibers were still intact and the majority of the ligament  The arthroscopic pump was placed on the wash mode and any excess debris was removed from the joint using suction.  60 cc of Marcaine with epinephrine was injected through the arthroscope.  The portals were closed with 3-0 nylon suture.  A sterile bandage, Ace wrap and Cryo/Cuff was placed and the Cryo/Cuff was activated. The patient was taken to the recovery room in stable condition.   PHYSICIAN ASSISTANT:   ASSISTANTS: none   ANESTHESIA:   general  EBL:  20 mL   BLOOD ADMINISTERED:none  DRAINS: none   LOCAL MEDICATIONS USED:  MARCAINE     SPECIMEN:  No Specimen  DISPOSITION OF SPECIMEN:  N/A  COUNTS:  YES  TOURNIQUET:  * Missing tourniquet times found for documented tourniquets in log: [id number] *  DICTATION: .Dragon Dictation  PLAN OF CARE: Discharge to home after PACU  PATIENT DISPOSITION:  PACU - hemodynamically stable.   Delay start of Pharmacological VTE agent (>24hrs) due to surgical blood loss or risk of bleeding: not applicable

## 2019-01-09 NOTE — Anesthesia Postprocedure Evaluation (Signed)
Anesthesia Post Note  Patient: Whitney Bush  Procedure(s) Performed: KNEE ARTHROSCOPY WITH MEDIAL MENISCECTOMY AND LATERAL MENISCECTOMY (Left Knee)  Patient location during evaluation: Phase II Anesthesia Type: General Level of consciousness: awake and alert Pain management: satisfactory to patient Vital Signs Assessment: post-procedure vital signs reviewed and stable Respiratory status: spontaneous breathing Cardiovascular status: stable Postop Assessment: no apparent nausea or vomiting Anesthetic complications: no     Last Vitals:  Vitals:   01/09/19 0931 01/09/19 0944  BP: (!) 125/98 (!) 140/92  Pulse: 93 90  Resp: 16 16  Temp:  36.6 C  SpO2: 99% 97%    Last Pain:  Vitals:   01/09/19 0944  TempSrc: Oral  PainSc: 7                  Caroleen Stoermer

## 2019-01-09 NOTE — Transfer of Care (Signed)
Immediate Anesthesia Transfer of Care Note  Patient: Whitney Bush  Procedure(s) Performed: KNEE ARTHROSCOPY WITH MEDIAL MENISCECTOMY AND LATERAL MENISCECTOMY (Left Knee)  Patient Location: PACU  Anesthesia Type:General  Level of Consciousness: awake and patient cooperative  Airway & Oxygen Therapy: Patient Spontanous Breathing and Patient connected to face mask oxygen  Post-op Assessment: Report given to RN and Post -op Vital signs reviewed and stable  Post vital signs: Reviewed and stable  Last Vitals:  Vitals Value Taken Time  BP 131/87 01/09/19 0900  Temp    Pulse 102 01/09/19 0902  Resp 13 01/09/19 0902  SpO2 100 % 01/09/19 0902  Vitals shown include unvalidated device data.  Last Pain:  Vitals:   01/09/19 0709  TempSrc: Oral  PainSc: 6          Complications: No apparent anesthesia complications

## 2019-01-09 NOTE — Op Note (Signed)
01/09/2019  8:43 AM  PATIENT:  Whitney Bush  48 y.o. female  PRE-OPERATIVE DIAGNOSIS:  torn medial and lateral meniscus left knee  POST-OPERATIVE DIAGNOSIS:  torn medial and lateral meniscus left knee, partial ACL tear  PROCEDURE:  Procedure(s): KNEE ARTHROSCOPY WITH MEDIAL MENISCECTOMY AND LATERAL MENISCECTOMY (Left)  Findings preop anterior drawer 1+ with a positive Lachman 1 pivot Intra-Op partial tear anteromedial bundle  Posterior horn medial meniscus tear with meniscal fragment flipped into the notch and then a posterior horn lateral meniscus tear parrot-beak chondral surfaces throughout the joint normal   SURGEON:  Surgeon(s) and Role:    Carole Civil, MD - Primary  Knee arthroscopy dictation  The patient was identified in the preoperative holding area using 2 approved identification mechanisms. The chart was reviewed and updated. The surgical site was confirmed as left knee and marked with an indelible marker.  The patient was taken to the operating room for anesthesia. After successful general anesthesia, Ancef was used as IV antibiotics.  The patient was placed in the supine position with the (left) the operative extremity in an arthroscopic leg holder and the opposite extremity in a padded leg holder.  The timeout was executed.  A lateral portal was established with an 11 blade and the scope was introduced into the joint. A diagnostic arthroscopy was performed in circumferential manner examining the entire knee joint. A medial portal was established and the diagnostic arthroscopy was repeated using a probe to palpate intra-articular structures as they were encountered.     The medial meniscus was resected using a motorized shaver. The meniscus was balanced with a combination of a motorized shaver and a 50 ArthroCare wand until a stable rim was obtained.  The lateral meniscus was resected using a combination of instruments including the 50 degree  ArthroCare wand  A second look and probing of the ACL indicated that it was probably the anteromedial bundle that was partially torn the lateral wall did not show an empty wall sign and the fibers were still intact and the majority of the ligament  The arthroscopic pump was placed on the wash mode and any excess debris was removed from the joint using suction.  60 cc of Marcaine with epinephrine was injected through the arthroscope.  The portals were closed with 3-0 nylon suture.  A sterile bandage, Ace wrap and Cryo/Cuff was placed and the Cryo/Cuff was activated. The patient was taken to the recovery room in stable condition.   PHYSICIAN ASSISTANT:   ASSISTANTS: none   ANESTHESIA:   general  EBL:  20 mL   BLOOD ADMINISTERED:none  DRAINS: none   LOCAL MEDICATIONS USED:  MARCAINE     SPECIMEN:  No Specimen  DISPOSITION OF SPECIMEN:  N/A  COUNTS:  YES  TOURNIQUET:  * Missing tourniquet times found for documented tourniquets in log: [id number] *  DICTATION: .Dragon Dictation  PLAN OF CARE: Discharge to home after PACU  PATIENT DISPOSITION:  PACU - hemodynamically stable.   Delay start of Pharmacological VTE agent (>24hrs) due to surgical blood loss or risk of bleeding: not applicable

## 2019-01-09 NOTE — Anesthesia Procedure Notes (Signed)
Procedure Name: Intubation Date/Time: 01/09/2019 7:44 AM Performed by: Georgeanne Nim, CRNA Pre-anesthesia Checklist: Emergency Drugs available, Suction available, Patient being monitored and Timeout performed Patient Re-evaluated:Patient Re-evaluated prior to induction Oxygen Delivery Method: Circle system utilized Preoxygenation: Pre-oxygenation with 100% oxygen Induction Type: IV induction Laryngoscope Size: Mac and 4 Grade View: Grade I Tube type: Oral Tube size: 7.5 mm Number of attempts: 1 Airway Equipment and Method: Stylet (pillow under shoulders for optimal sniffing position) Placement Confirmation: ETT inserted through vocal cords under direct vision,  positive ETCO2,  CO2 detector and breath sounds checked- equal and bilateral Secured at: 20 cm Tube secured with: Tape Dental Injury: Teeth and Oropharynx as per pre-operative assessment

## 2019-01-10 ENCOUNTER — Encounter (HOSPITAL_COMMUNITY): Payer: Self-pay | Admitting: Orthopedic Surgery

## 2019-01-16 DIAGNOSIS — Z9889 Other specified postprocedural states: Secondary | ICD-10-CM | POA: Insufficient documentation

## 2019-01-17 ENCOUNTER — Other Ambulatory Visit: Payer: Self-pay

## 2019-01-17 ENCOUNTER — Ambulatory Visit (INDEPENDENT_AMBULATORY_CARE_PROVIDER_SITE_OTHER): Payer: Medicaid Other | Admitting: Orthopedic Surgery

## 2019-01-17 ENCOUNTER — Encounter: Payer: Self-pay | Admitting: Orthopedic Surgery

## 2019-01-17 VITALS — BP 115/77 | HR 81 | Temp 98.2°F | Ht 63.0 in | Wt 240.0 lb

## 2019-01-17 DIAGNOSIS — Z9889 Other specified postprocedural states: Secondary | ICD-10-CM

## 2019-01-17 MED ORDER — HYDROCODONE-ACETAMINOPHEN 5-325 MG PO TABS: 1.0000 | ORAL_TABLET | Freq: Four times a day (QID) | ORAL | 0 refills | Status: AC | PRN

## 2019-01-17 NOTE — Progress Notes (Signed)
Postop visit  Postop day 8 status post arthroscopy exam under anesthesia left knee  1+ anterior drawer and Lachman with a plus minus pivot partial tear of the anteromedial bundle of the ACL posterior horn medial meniscal tear with fragment flipped into the notch and she also had a posterior horn lateral meniscal tear with a parrot-beak configuration  She complains of stiffness and swelling  Sutures were removed  Range of motion is very limited recommend physical therapy  I refilled her medication she is walking with a crutch on and off but improving  Follow-up in 6 weeks  Meds ordered this encounter  Medications  . HYDROcodone-acetaminophen (NORCO/VICODIN) 5-325 MG tablet    Sig: Take 1 tablet by mouth every 6 (six) hours as needed for up to 5 days for moderate pain.    Dispense:  20 tablet    Refill:  0

## 2019-01-17 NOTE — Patient Instructions (Signed)
Unable to do community service for 4 weeks   Start therapy left knee   You have 1 prescription for pain medication after it is finished take tylenol or advil for pain and use ice   Avoid opioids !

## 2019-01-31 ENCOUNTER — Ambulatory Visit (INDEPENDENT_AMBULATORY_CARE_PROVIDER_SITE_OTHER): Payer: Medicaid Other | Admitting: Gastroenterology

## 2019-01-31 ENCOUNTER — Encounter: Payer: Self-pay | Admitting: Gastroenterology

## 2019-01-31 ENCOUNTER — Other Ambulatory Visit: Payer: Self-pay

## 2019-01-31 ENCOUNTER — Ambulatory Visit: Payer: Medicaid Other | Admitting: Gastroenterology

## 2019-01-31 VITALS — BP 116/82 | HR 77 | Temp 96.6°F | Ht 63.0 in | Wt 241.0 lb

## 2019-01-31 DIAGNOSIS — K7030 Alcoholic cirrhosis of liver without ascites: Secondary | ICD-10-CM | POA: Diagnosis not present

## 2019-01-31 NOTE — Patient Instructions (Signed)
1. We will contact you in April when your labs and ultrasound are due. We will see you back in the office after results available. 2. Continue to avoid alcohol. Would recommend you be involved in substance abuse programs as discussed.  3. You have gallstones in your gallbladder. If they become an issue, you may develop pain in the right upper abdomen after meals, vomiting. Let us know if you have any problems.    Cholelithiasis  Cholelithiasis is also called "gallstones." It is a kind of gallbladder disease. The gallbladder is an organ that stores a liquid (bile) that helps you digest fat. Gallstones may not cause symptoms (may be silent gallstones) until they cause a blockage, and then they can cause pain (gallbladder attack). Follow these instructions at home:  Take over-the-counter and prescription medicines only as told by your doctor.  Stay at a healthy weight.  Eat healthy foods. This includes: ? Eating fewer fatty foods, like fried foods. ? Eating fewer refined carbs (refined carbohydrates). Refined carbs are breads and grains that are highly processed, like white bread and white rice. Instead, choose whole grains like whole-wheat bread and [place] rice. ? Eating more fiber. Almonds, fresh fruit, and beans are healthy sources of fiber.  Keep all follow-up visits as told by your doctor. This is important. Contact a doctor if:  You have sudden pain in the upper right side of your belly (abdomen). Pain might spread to your right shoulder or your chest. This may be a sign of a gallbladder attack.  You feel sick to your stomach (are nauseous).  You throw up (vomit).  You have been diagnosed with gallstones that have no symptoms and you get: ? Belly pain. ? Discomfort, burning, or fullness in the upper part of your belly (indigestion). Get help right away if:  You have sudden pain in the upper right side of your belly, and it lasts for more than 2 hours.  You have belly pain that  lasts for more than 5 hours.  You have a fever or chills.  You keep feeling sick to your stomach or you keep throwing up.  Your skin or the whites of your eyes turn yellow (jaundice).  You have dark-colored pee (urine).  You have light-colored poop (stool). Summary  Cholelithiasis is also called "gallstones."  The gallbladder is an organ that stores a liquid (bile) that helps you digest fat.  Silent gallstones are gallstones that do not cause symptoms.  A gallbladder attack may cause sudden pain in the upper right side of your belly. Pain might spread to your right shoulder or your chest. If this happens, contact your doctor.  If you have sudden pain in the upper right side of your belly that lasts for more than 2 hours, get help right away. This information is not intended to replace advice given to you by your health care provider. Make sure you discuss any questions you have with your health care provider. Document Released: 08/02/2007 Document Revised: 01/26/2017 Document Reviewed: 10/31/2015 Elsevier Patient Education  2020 [employer].

## 2019-01-31 NOTE — Progress Notes (Signed)
Primary Care Physician: Jerel Shepherd, [place]  Primary Gastroenterologist:  Garfield Cornea, MD   Chief Complaint  Patient presents with  . Cirrhosis    Soreness under ribs    HPI: Whitney Bush is a 48 y.o. female here for follow-up.  Seen back in September.  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  Also with history of IDA in the setting of heavy menses.  Underwent a hysterectomy in August 2019.  EGD October 2018 showed 2 columns of grade 2 esophageal varices, 1 column of grade 3.  Mild portal hypertensive gastropathy, biopsy showing reactive gastropathy but no H. pylori.  Esophagus dilated to 66 [place].  Colonoscopy May 2017, inadequate bowel prep.  One 6 mm polyp in the splenic flexure removed, tubular adenoma.  Recommended 5-year follow-up colonoscopy.  At her last office visit I refilled all of her medications as she had come off of them.  Restarted lactulose, Aldactone, Lasix, propanolol.  Provided low-dose Librium to help with anxiety/alcohol withdrawal.  Gave her national helpline information.  Labs were updated indicating meld sodium of 7.  Right upper quadrant ultrasound in September showed cholelithiasis, cirrhosis but no evidence of hepatoma.  Doing well.  She has some vague pain in the left lower rib area which is positional.  Denies any significant right upper quadrant postprandial pain.  She has a history of cholelithiasis.  No nausea or vomiting.  Weight is stable.  She had knee surgery 2 weeks ago, Dr. Aline Brochure.  Doing well.  Since we last saw her she has reduced alcohol use.  Drinks 1-2 times per month, 24 ounces of beer at a time.  Has been going to some substance abuse meetings.  Reports that she is not taking her fluid pills on a regular basis, just when needed.  Bowel movements twice per day.  No melena or rectal bleeding.  States she has changed her mind on future consideration of liver transplant. She is now open to consider transplant.      Current  Outpatient Medications  Medication Sig Dispense Refill  . albuterol (PROVENTIL HFA;VENTOLIN HFA) 108 (90 Base) MCG/ACT inhaler Inhale 1-2 puffs into the lungs every 6 (six) hours as needed for wheezing or shortness of breath. 1 Inhaler 0  . B Complex-C (SUPER B COMPLEX PO) Take 1 capsule by mouth daily.     Marland Kitchen FLUoxetine (PROZAC) 20 MG capsule Take 20 mg by mouth daily.    . furosemide (LASIX) 40 MG tablet Take 1 tablet (40 mg total) by mouth daily. 30 tablet 5  . lactulose (CONSTULOSE) 10 GM/15ML solution TAKE 30 MLS BY MOUTH TWICE DAILY AS NEEDED FOR MILD CONSTIPATION OR MODERATE CONSTIPATION. 1892 mL 5  . promethazine (PHENERGAN) 12.5 MG tablet Take 1 tablet (12.5 mg total) by mouth every 6 (six) hours as needed for nausea or vomiting. (Patient taking differently: Take 12.5 mg by mouth as needed for nausea or vomiting. ) 30 tablet 0  . propranolol (INDERAL) 20 MG tablet Take 1 tablet (20 mg total) by mouth 2 (two) times daily. 60 tablet 5  . spironolactone (ALDACTONE) 50 MG tablet Take 1 tablet (50 mg total) by mouth daily. 30 tablet 5  . traZODone (DESYREL) 100 MG tablet Take 100 mg by mouth as needed.      No current facility-administered medications for this visit.     Allergies as of 01/31/2019  . (No Known Allergies)    ROS:  General: Negative for anorexia, weight loss,  fever, chills, fatigue, weakness. ENT: Negative for hoarseness, difficulty swallowing , nasal congestion. CV: Negative for chest pain, angina, palpitations, dyspnea on exertion, peripheral edema.  Respiratory: Negative for dyspnea at rest, dyspnea on exertion, cough, sputum, wheezing.  GI: See history of present illness. GU:  Negative for dysuria, hematuria, urinary incontinence, urinary frequency, nocturnal urination.  Endo: Negative for unusual weight change.    Physical Examination:   BP 116/82   Pulse 77   Temp (!) 96.6 F (35.9 C)   Ht 5\' 3"  (1.6 m)   Wt 241 lb (109.3 kg)   LMP 09/12/2017 (Exact Date)  Comment:    BMI 42.69 kg/m   General: Well-nourished, well-developed in no acute distress.  Eyes: No icterus. Mouth: Oropharyngeal mucosa moist and pink , no lesions erythema or exudate. Lungs: Clear to auscultation bilaterally.  Heart: Regular rate and rhythm, no murmurs rubs or gallops.  Abdomen: Bowel sounds are normal, nontender, nondistended, no hepatosplenomegaly or masses, no abdominal bruits or hernia , no rebound or guarding.   Extremities: No lower extremity edema. No clubbing or deformities. Neuro: Alert and oriented x 4   Skin: Warm and dry, no jaundice.   Psych: Alert and cooperative, normal mood and affect.  Labs:  Lab Results  Component Value Date   CREATININE 0.91 01/07/2019   BUN 12 01/07/2019   NA 139 01/07/2019   K 3.7 01/07/2019   CL 100 01/07/2019   CO2 27 01/07/2019   Lab Results  Component Value Date   WBC 10.5 01/07/2019   HGB 14.5 01/07/2019   HCT 45.3 01/07/2019   MCV 93.6 01/07/2019   PLT 188 01/07/2019   Lab Results  Component Value Date   ALT 20 12/05/2018   AST 24 12/05/2018   ALKPHOS 88 10/23/2017   BILITOT 0.8 12/05/2018   Lab Results  Component Value Date   INR 1.1 12/05/2018   INR 1.1 08/27/2018   INR 1.1 12/25/2017    Imaging Studies: No results found.

## 2019-02-02 NOTE — Assessment & Plan Note (Addendum)
Doing better. Has significantly reduced etoh consumption. States she is staying by herself now. Keeps her one year grandson a lot. Prefers not to take diuretics when she is keeping him because of increased urinary frequency. Tolerating being off diuretics without recurrent anasarca.   She will continue lactulose and propranolol. She will be due labs and u/s in 05/2019. Will plan to see her back in office after that.   Encouraged her to strive for etoh cessation. We discussed need for her to attend addiction program. She is interest in possibility of liver transplant in the future if it becomes necessary and aware of need for etoh cessation along with attending addiction programs.

## 2019-02-04 ENCOUNTER — Telehealth: Payer: Self-pay | Admitting: Orthopedic Surgery

## 2019-02-04 ENCOUNTER — Other Ambulatory Visit: Payer: Self-pay

## 2019-02-04 DIAGNOSIS — K7030 Alcoholic cirrhosis of liver without ascites: Secondary | ICD-10-CM

## 2019-02-04 DIAGNOSIS — Z79899 Other long term (current) drug therapy: Secondary | ICD-10-CM
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
## 2019-02-05 ENCOUNTER — Ambulatory Visit: Payer: Medicaid Other | Admitting: Gastroenterology

## 2019-02-10 ENCOUNTER — Telehealth (HOSPITAL_COMMUNITY): Payer: Self-pay

## 2019-02-10 ENCOUNTER — Telehealth (HOSPITAL_COMMUNITY): Payer: Self-pay | Admitting: Physical Therapy

## 2019-02-10 NOTE — Telephone Encounter (Signed)
Pt was scheduled for eval on 12/15 and had to reschedule, therefore this appt was cancelled

## 2019-02-10 NOTE — Telephone Encounter (Signed)
Pt cancelled appt for 12/15. No reason given

## 2019-02-11 ENCOUNTER — Ambulatory Visit (HOSPITAL_COMMUNITY): Payer: Medicaid Other | Admitting: Physical Therapy

## 2019-02-13 ENCOUNTER — Ambulatory Visit (HOSPITAL_COMMUNITY): Payer: Medicaid Other

## 2019-02-18 ENCOUNTER — Ambulatory Visit (HOSPITAL_COMMUNITY): Payer: Medicaid Other | Attending: Orthopedic Surgery | Admitting: Physical Therapy

## 2019-02-18 ENCOUNTER — Encounter (HOSPITAL_COMMUNITY): Payer: Self-pay | Admitting: Physical Therapy

## 2019-02-18 ENCOUNTER — Other Ambulatory Visit: Payer: Self-pay

## 2019-02-18 DIAGNOSIS — M25662 Stiffness of left knee, not elsewhere classified: Secondary | ICD-10-CM | POA: Insufficient documentation

## 2019-02-18 DIAGNOSIS — M25562 Pain in left knee: Secondary | ICD-10-CM | POA: Diagnosis present

## 2019-02-18 NOTE — Addendum Note (Signed)
Addended by: Josue Hector A on: 02/18/2019 04:05 PM   Modules accepted: Orders

## 2019-02-18 NOTE — Therapy (Signed)
[place], [place], [address] Phone: [phone]   Fax:  [phone]  Physical Therapy Evaluation  Patient Details  Name: Whitney Bush MRN: [id number] Date of Birth: May 18, 1970 Referring Provider (PT): Arther Abbott MD   Encounter Date: 02/18/2019  PT End of Session - 02/18/19 1524    Visit Number  1    Number of Visits  9    Date for PT Re-Evaluation  03/21/19    Authorization Type  Medicaid (sumbitted for 3 visits 12/22)    Authorization Time Period  02/18/19-03/21/19    Authorization - Visit Number  1    Authorization - Number of Visits  4    PT Start Time  [id number]    PT Stop Time  1550    PT Time Calculation (min)  35 min    Activity Tolerance  Patient tolerated treatment well       Past Medical History:  Diagnosis Date  . Alcohol abuse   . 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 Anemia   . Blood transfusion without reported diagnosis   . Boil 06/04/2015  . Chronic kidney disease   . Clotting disorder ([place])   . COPD (chronic obstructive pulmonary disease) ([place])   . Depression   . GERD (gastroesophageal reflux disease)   . GI (gastrointestinal bleed) 07/28/2015  . Heart disease   . Heart murmur   . Hyperlipidemia   . Hypertension   . Hypothyroidism   . Migraines   . OCD (obsessive compulsive disorder)   . Peripheral edema   . PTSD (post-traumatic stress disorder)   . Tubular adenoma     Past Surgical History:  Procedure Laterality Date  . ABCESS DRAINAGE     x5; neck, arm, chest, back  . BIOPSY  02/24/2015   Procedure: BIOPSY;  Surgeon: Danie Binder, MD;  [facility];  Service: Endoscopy;;  gastric bx's  . BIOPSY  12/25/2016   Procedure: BIOPSY;  Surgeon: Daneil Dolin, MD;  [facility];  Service: Endoscopy;;  gastric  . CESAREAN SECTION    . COLONOSCOPY WITH PROPOFOL N/A 06/28/2015   Dr.Rourk- inadequate prep- One 6 mm polyp at  the splenic flexure bx= tubular adenoma  . ESOPHAGEAL BANDING N/A 12/25/2016   Procedure: ESOPHAGEAL BANDING with Propofol;  Surgeon: Daneil Dolin, MD;  [facility];  Service: Endoscopy;  Laterality: N/A;  possible banding of varices  . ESOPHAGOGASTRODUODENOSCOPY (EGD) WITH PROPOFOL N/A 02/24/2015   SLF: Grade II esophageal varices Moderate portal hypertensive gastropathy Mild erosive gastritis anemia likely due to many factors: gastritis, gastropathy, coagulopathy, chronic disease  . ESOPHAGOGASTRODUODENOSCOPY (EGD) WITH PROPOFOL N/A 12/25/2016   Procedure: ESOPHAGOGASTRODUODENOSCOPY (EGD) WITH PROPOFOL;  Surgeon: Daneil Dolin, MD;  [facility];  Service: Endoscopy;  Laterality: N/A;  8:45am  . KNEE ARTHROSCOPY WITH MEDIAL MENISECTOMY Left 01/09/2019   Procedure: KNEE ARTHROSCOPY WITH MEDIAL MENISCECTOMY AND LATERAL MENISCECTOMY;  Surgeon: Carole Civil, MD;  Location: [facility];  Service: Orthopedics;  Laterality: Left;  Marland Kitchen MALONEY DILATION N/A 12/25/2016   Procedure: Venia Minks DILATION;  Surgeon: Daneil Dolin, MD;  [facility];  Service: Endoscopy;  Laterality: N/A;  . POLYPECTOMY  06/28/2015   Procedure: POLYPECTOMY;  Surgeon: Daneil Dolin, MD;  [facility];  Service: Endoscopy;;  at splenic flexure  . SUPRACERVICAL ABDOMINAL HYSTERECTOMY N/A 10/02/2017  Procedure: HYSTERECTOMY SUPRACERVICAL ABDOMINAL;  Surgeon: Jonnie Kind, MD;  Location: [facility];  Service: Gynecology;  Laterality: N/A;    There were no vitals filed for this visit.   Subjective Assessment - 02/18/19 1519    Subjective  Patient presents to physical therapy with complaint of LT knee pain s/p LT knee arthroscopic surgery on 01/09/19. Patient says she has not yet had therapy for knee and is having some issues including knee popping and notes that she can not bend her knee all the way or kneel comfortably.    Limitations  House hold activities;Walking    How long can  you stand comfortably?  10-15 minutes    How long can you walk comfortably?  10 minutes    Patient Stated Goals  Have knee back to how it was    Currently in Pain?  Yes    Pain Score  8     Pain Location  Knee    Pain Orientation  Left;Anterior    Pain Descriptors / Indicators  Sharp;Stabbing;Aching    Pain Type  Surgical pain    Pain Onset  More than a month ago    Pain Frequency  Constant    Aggravating Factors   Lifting, bending, stairs    Pain Relieving Factors  sitting still, having leg straight, rest    Effect of Pain on Daily Activities  Limits         [facility] PT Assessment - 02/18/19 0001      Assessment   Medical Diagnosis  Lt knee pain s/p arthroscope    Referring Provider (PT)  Arther Abbott MD    Onset Date/Surgical Date  01/09/19    Next MD Visit  02/26/19    Prior Therapy  No       Precautions   Precautions  None      Restrictions   Weight Bearing Restrictions  No      Balance Screen   Has the patient fallen in the past 6 months  Yes    How many times?  [place] residence    Living Arrangements  Alone      Prior Function   Level of Independence  Independent      Cognition   Overall Cognitive Status  Within Functional Limits for tasks assessed      Sensation   Light Touch  Appears Intact      ROM / Strength   AROM / PROM / Strength  AROM;Strength      AROM   AROM Assessment Site  Knee    Right/Left Knee  Right;Left    Right Knee Extension  0    Right Knee Flexion  125    Left Knee Extension  1    Left Knee Flexion  105      Strength   Strength Assessment Site  Knee;Hip;Ankle    Right/Left Hip  Right;Left    Right Hip Flexion  5/5    Right Hip Extension  4+/5    Right Hip ABduction  4+/5    Left Hip Flexion  4/5    Left Hip Extension  4/5    Left Hip ABduction  4/5    Right/Left Knee  Right;Left    Right Knee Flexion  5/5    Right Knee Extension  5/5    Left Knee Flexion  4/5    Left  Knee Extension  4+/5  Right/Left Ankle  Right;Left    Right Ankle Dorsiflexion  5/5    Left Ankle Dorsiflexion  5/5      Palpation   Palpation comment  Min/mod tenderness to palpation about  medial knee joint line, and distal ITB       Ambulation/Gait   Ambulation/Gait  Yes    Ambulation/Gait Assistance  6: Modified independent (Device/Increase time)    Ambulation Distance (Feet)  435 Feet    Assistive device  None    Gait Pattern  Decreased dorsiflexion - left;Decreased stance time - left;Antalgic    Ambulation Surface  Level    Gait Comments  2MWT      6 Minute Walk- Baseline   6 Minute Walk- Baseline  yes      Balance   Balance Assessed  Yes      Static Standing Balance   Static Standing Balance -  Activities   Tandam Stance - Right Leg;Tandam Stance - Left Leg;Single Leg Stance - Right Leg;Single Leg Stance - Left Leg    Static Standing - Comment/# of Minutes  15 sec; 15 sec; 15 sec min sway; 15 sec                Objective measurements completed on examination: See above findings.                PT Short Term Goals - 02/18/19 1558      PT SHORT TERM GOAL #1   Title  Patient will be independent with initial HEP to improve functional outcomes    Time  2    Period  Weeks    Status  New        PT Long Term Goals - 02/18/19 1559      PT LONG TERM GOAL #1   Title  Patient will have LT knee AROM 0-120 degrees to improve functional mobility and facilitate squatting to pick up items from floor.    Time  4    Period  Weeks    Status  New    Target Date  03/21/19      PT LONG TERM GOAL #2   Title  Patient will have equal to or > 4+/5 MMT throughout LLE to improve ability to perform functional mobility, stair ambulation and ADLs.    Time  4    Period  Weeks    Status  New    Target Date  03/21/19      PT LONG TERM GOAL #3   Title  Patient will report at least 75% overall improvement in subjective complaint to indicate improvement in ability  to perform ADLs.    Time  4    Period  Weeks    Status  New    Target Date  03/21/19             Plan - 02/18/19 1554    Clinical Impression Statement  Patient is a 48 y.o. female who presents to physical therapy with complaint of LT knee pain s/p LT knee scope on 01/09/19. Patient demonstrates decreased strength, ROM restriction, and gait abnormalities which are likely contributing to symptoms of pain and are negatively impacting patient ability to perform ADLs and functional mobility tasks. Patient will benefit from skilled physical therapy services to address these deficits to reduce pain and improve level of function with ADLs and functional mobility tasks.    Examination-Activity Limitations  Bend;Locomotion Level;Squat;Transfers;Stand    Examination-Participation Restrictions  Community Activity;Other  Stability/Clinical Decision Making  Stable/Uncomplicated    Clinical Decision Making  Low    Rehab Potential  Good    PT Frequency  2x / week    PT Duration  4 weeks    PT Treatment/Interventions  ADLs/Self Care Home Management;Cryotherapy;Moist Heat;Electrical Stimulation;Gait training;Stair training;DME Instruction;Functional mobility training;Neuromuscular re-education;Balance training;Therapeutic exercise;Therapeutic activities;Patient/family education;Orthotic Fit/Training;Manual techniques;Passive range of motion;Joint Manipulations;Scar mobilization;Compression bandaging    PT Next Visit Plan  Initiate treatment. Review goals. Issue HEP. Progress LLE strength as tolerated. Add quad sets, heel slides, SLR, bridging    PT Home Exercise Plan  Issue at next visit    Consulted and Agree with Plan of Care  Patient       Patient will benefit from skilled therapeutic intervention in order to improve the following deficits and impairments:  Abnormal gait, Improper body mechanics, Pain, Decreased mobility, Decreased activity tolerance, Decreased range of motion, Decreased strength,  Hypomobility, Impaired flexibility, Difficulty walking, Decreased balance  Visit Diagnosis: Acute pain of left knee  Stiffness of left knee, not elsewhere classified     Problem List Patient Active Problem List   Diagnosis Date Noted  . S/P left knee arthroscopy 01/09/2019 01/16/2019  . Acute medial meniscus tear of left knee   . Old peripheral tear of lateral meniscus of left knee   . Left anterior cruciate ligament tear   . LUQ pain 03/19/2018  . Status post abdominal supracervical subtotal hysterectomy 10/02/2017  . Anemia due to chronic blood loss 06/07/2017  . Symptomatic anemia 06/03/2017  . Abnormal uterine bleeding (AUB) 02/12/2017  . Acute blood loss anemia 11/27/2016  . Thickened endometrium 11/23/2016  . Uterine leiomyoma 11/23/2016  . Menorrhagia with regular cycle 11/23/2016  . Alcoholic cirrhosis of liver ([place]) 07/07/2016  . Esophageal dysphagia 07/07/2016  . Esophageal varices without bleeding ([place]) 07/07/2016  . IDA (iron deficiency anemia) 06/09/2016  . PTSD (post-traumatic stress disorder) 04/20/2016  . OCD (obsessive compulsive disorder) 04/20/2016  . Severe recurrent major depression without psychotic features ([place]) 04/19/2016  . GERD (gastroesophageal reflux disease) 03/09/2016  . Depression 02/07/2016  . Bilateral edema of lower extremity   . History of colonic polyps   . Alcoholic cirrhosis of liver with ascites ([place])   . Anasarca 02/16/2015  . Alcoholic hepatitis with ascites   . Ascites   . Bleeding gastrointestinal   . AKI (acute kidney injury) ([place]) 12/10/2014  . Volume overload 12/10/2014  . Alcoholic cirrhosis ([place])   . Elevated bilirubin   . Nausea with vomiting   . Diarrhea   . Absolute anemia 12/02/2014  . Jaundice 12/02/2014  . Essential hypertension 12/02/2014  . Alcohol use disorder, moderate, dependence ([place]) 12/02/2014  . Abdominal pain 12/02/2014   4:03 PM, 02/18/19 Josue Hector PT DPT  Physical Therapist with [facility]  [phone]   [facility] [address] [place], [place], [address] Phone: [phone]   Fax:  [phone]  Name: Whitney Bush MRN: [id number] Date of Birth: 1970-03-14

## 2019-02-24 ENCOUNTER — Telehealth (HOSPITAL_COMMUNITY): Payer: Self-pay | Admitting: Physical Therapy

## 2019-02-24 ENCOUNTER — Ambulatory Visit (HOSPITAL_COMMUNITY): Payer: Medicaid Other | Admitting: Physical Therapy

## 2019-02-24 NOTE — Telephone Encounter (Signed)
Called patient about missed appointment today at 5:30. Patient states she did not have transportation. Informed patient about upcoming appt on 12/30 at 8:15am, and to call office prior if she has trouble making it, or needs to reschedule.    6:15 PM, 02/24/19 Josue Hector PT DPT  Physical Therapist with [facility]  [phone]

## 2019-02-25 ENCOUNTER — Telehealth (HOSPITAL_COMMUNITY): Payer: Self-pay | Admitting: Physical Therapy

## 2019-02-25 NOTE — Telephone Encounter (Signed)
Pt called and cancelled appt for 12/30 no reason given

## 2019-02-26 ENCOUNTER — Other Ambulatory Visit: Payer: Self-pay

## 2019-02-26 ENCOUNTER — Encounter: Payer: Self-pay | Admitting: Orthopedic Surgery

## 2019-02-26 ENCOUNTER — Ambulatory Visit (HOSPITAL_COMMUNITY): Payer: Medicaid Other | Admitting: Physical Therapy

## 2019-02-26 ENCOUNTER — Ambulatory Visit (INDEPENDENT_AMBULATORY_CARE_PROVIDER_SITE_OTHER): Payer: Medicaid Other | Admitting: Orthopedic Surgery

## 2019-02-26 DIAGNOSIS — G629 Polyneuropathy, unspecified: Secondary | ICD-10-CM

## 2019-02-26 DIAGNOSIS — Z9889 Other specified postprocedural states: Secondary | ICD-10-CM

## 2019-02-26 NOTE — Progress Notes (Signed)
Postop visit  Chief Complaint  Patient presents with  . Routine Post Op    01/09/2019 left knee scope    Status post arthroscopy exam under anesthesia left knee  Date of surgery was January 09, 2019  Operative findings preop 1+ drawer positive [place] 1+ pivot Intra-Op partial ACL tear anteromedial bundle there was a posterior horn medial meniscus tear and posterior horn lateral meniscus tear  Today the patient is ambulatory independently she has full range of motion the knee she has a small joint effusion  Recommend  Continue strengthening exercises Ice several times a day Start naproxen twice a day  Complains of chronic numbness in the feet with tingling start gabapentin 100 mg 3 times a day  Encounter Diagnoses  Name Primary?  . S/P left knee arthroscopy 01/09/2019 Yes  . Neuropathy     Follow-up as needed

## 2019-02-26 NOTE — Patient Instructions (Signed)
It looks like he still have some fluid in the knee but I think with ice and naproxen 1 twice a day that should resolve  Continue to exercise for overall health continue exercise to strengthen your knee  Start gabapentin 100 mg 3 times a day for tingling in the feet

## 2019-03-03 ENCOUNTER — Telehealth (HOSPITAL_COMMUNITY): Payer: Self-pay | Admitting: Physical Therapy

## 2019-03-03 ENCOUNTER — Encounter (HOSPITAL_COMMUNITY): Payer: Self-pay | Admitting: Physical Therapy

## 2019-03-03 ENCOUNTER — Other Ambulatory Visit: Payer: Self-pay

## 2019-03-03 ENCOUNTER — Ambulatory Visit (HOSPITAL_COMMUNITY): Payer: Medicaid Other | Attending: Orthopedic Surgery | Admitting: Physical Therapy

## 2019-03-03 DIAGNOSIS — M25562 Pain in left knee: Secondary | ICD-10-CM | POA: Insufficient documentation

## 2019-03-03 DIAGNOSIS — M25662 Stiffness of left knee, not elsewhere classified: Secondary | ICD-10-CM | POA: Insufficient documentation

## 2019-03-03 NOTE — Telephone Encounter (Signed)
Pt called l/m stating she did not want to do anything with PT- D/c her today

## 2019-03-03 NOTE — Therapy (Signed)
[place], [place], [address] Phone: [phone]   Fax:  [phone]  Physical Therapy Treatment/ Discharge Summary  Patient Details  Name: Whitney Bush MRN: [id number] Date of Birth: 1970/11/29 Referring Provider (PT): Arther Abbott MD   Encounter Date: 03/03/2019   PHYSICAL THERAPY DISCHARGE SUMMARY  Visits from Start of Care: 2  Current functional level related to goals / functional outcomes: Unable to reassess, see assessment    Remaining deficits: See below   Education / Equipment: See assessment  Plan: Patient agrees to discharge.  Patient goals were not met. Patient is being discharged due to the patient's request.  ?????       PT End of Session - 03/03/19 0856    Number of Visits  9    Date for PT Re-Evaluation  03/21/19    Authorization Type  Medicaid (submitted for 2 visits 12/22)    Authorization Time Period  02/18/19-03/21/19    Authorization - Visit Number  1    Authorization - Number of Visits  3    PT Start Time  0820    PT Stop Time  0850    PT Time Calculation (min)  30 min    Behavior During Therapy  Agitated;Impulsive       Past Medical History:  Diagnosis Date  . Alcohol abuse   . 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 Anemia   . Blood transfusion without reported diagnosis   . Boil 06/04/2015  . Chronic kidney disease   . Clotting disorder ([place])   . COPD (chronic obstructive pulmonary disease) ([place])   . Depression   . GERD (gastroesophageal reflux disease)   . GI (gastrointestinal bleed) 07/28/2015  . Heart disease   . Heart murmur   . Hyperlipidemia   . Hypertension   . Hypothyroidism   . Migraines   . OCD (obsessive compulsive disorder)   . Peripheral edema   . PTSD (post-traumatic stress disorder)   . Tubular adenoma     Past Surgical History:  Procedure Laterality Date  . ABCESS DRAINAGE     x5; neck, arm, chest, back   . BIOPSY  02/24/2015   Procedure: BIOPSY;  Surgeon: Danie Binder, MD;  [facility];  Service: Endoscopy;;  gastric bx's  . BIOPSY  12/25/2016   Procedure: BIOPSY;  Surgeon: Daneil Dolin, MD;  Location: [facility];  Service: Endoscopy;;  gastric  . CESAREAN SECTION    . COLONOSCOPY WITH PROPOFOL N/A 06/28/2015   Dr.Rourk- inadequate prep- One 6 mm polyp at the splenic flexure bx= tubular adenoma  . ESOPHAGEAL BANDING N/A 12/25/2016   Procedure: ESOPHAGEAL BANDING with Propofol;  Surgeon: Daneil Dolin, MD;  [facility];  Service: Endoscopy;  Laterality: N/A;  possible banding of varices  . ESOPHAGOGASTRODUODENOSCOPY (EGD) WITH PROPOFOL N/A 02/24/2015   SLF: Grade II esophageal varices Moderate portal hypertensive gastropathy Mild erosive gastritis anemia likely due to many factors: gastritis, gastropathy, coagulopathy, chronic disease  . ESOPHAGOGASTRODUODENOSCOPY (EGD) WITH PROPOFOL N/A 12/25/2016   Procedure: ESOPHAGOGASTRODUODENOSCOPY (EGD) WITH PROPOFOL;  Surgeon: Daneil Dolin, MD;  [facility];  Service: Endoscopy;  Laterality: N/A;  8:45am  . KNEE ARTHROSCOPY WITH MEDIAL MENISECTOMY Left 01/09/2019   Procedure: KNEE ARTHROSCOPY WITH MEDIAL MENISCECTOMY AND LATERAL MENISCECTOMY;  Surgeon: Carole Civil, MD;  Location: [facility];  Service: Orthopedics;  Laterality: Left;  .  MALONEY DILATION N/A 12/25/2016   Procedure: Venia Minks DILATION;  Surgeon: Daneil Dolin, MD;  [facility];  Service: Endoscopy;  Laterality: N/A;  . POLYPECTOMY  06/28/2015   Procedure: POLYPECTOMY;  Surgeon: Daneil Dolin, MD;  [facility];  Service: Endoscopy;;  at splenic flexure  . SUPRACERVICAL ABDOMINAL HYSTERECTOMY N/A 10/02/2017   Procedure: HYSTERECTOMY SUPRACERVICAL ABDOMINAL;  Surgeon: Jonnie Kind, MD;  Location: [facility];  Service: Gynecology;  Laterality: N/A;    There were no vitals filed for this visit.  Subjective Assessment -  03/03/19 0856    Subjective  Patient arrived to today's visit noting that she had been drinking late last night, and noted that she was still "discombobulated". Patient displayed varying levels of emotional distress about multiple issues, several of which are unrelated to physical therapy. Patient appears agitated that her leg has not been "fixed" and is questioning what therapy is going to do for her LT side weakness.    Limitations  House hold activities;Walking    How long can you stand comfortably?  10-15 minutes    How long can you walk comfortably?  10 minutes    Patient Stated Goals  Have knee back to how it was    Pain Onset  More than a month ago                                 PT Short Term Goals - 03/03/19 1612      PT SHORT TERM GOAL #1   Title  Patient will be independent with initial HEP to improve functional outcomes    Time  2    Period  Weeks    Status  Not Met        PT Long Term Goals - 03/03/19 1612      PT LONG TERM GOAL #1   Title  Patient will have LT knee AROM 0-120 degrees to improve functional mobility and facilitate squatting to pick up items from floor.    Time  4    Period  Weeks    Status  Not Met      PT LONG TERM GOAL #2   Title  Patient will have equal to or > 4+/5 MMT throughout LLE to improve ability to perform functional mobility, stair ambulation and ADLs.    Time  4    Period  Weeks    Status  Not Met      PT LONG TERM GOAL #3   Title  Patient will report at least 75% overall improvement in subjective complaint to indicate improvement in ability to perform ADLs.    Time  4    Period  Weeks    Status  Not Met            Plan - 03/03/19 0858    Clinical Impression Statement  Patient arrived to session today presenting in an impulsive and agitated manner. Patient appeared to be, and later eluded to the fact, that she was presently under the influence of alcohol. Patient was very emotional, citing issues at  home and with family members, and became mildly aggressive with word choice/ inappropriate suggestions. Instructed patient that formal session could not be completed today with patient in present form, and in accordance with hospital policy. Patient was educated on possible referral back to family care MD to arrange emotional support/ psychiatric referral to discuss the non-physical  therapy related issues she had mentioned. Patient declined. Patient later called in therapy office to request DC form therapy at this time.    Examination-Activity Limitations  Bend;Locomotion Level;Squat;Transfers;Stand    Examination-Participation Restrictions  Community Activity;Other    Stability/Clinical Decision Making  Stable/Uncomplicated    Rehab Potential  Good    PT Frequency  --    PT Duration  --    PT Treatment/Interventions  ADLs/Self Care Home Management;Cryotherapy;Moist Heat;Electrical Stimulation;Gait training;Stair training;DME Instruction;Functional mobility training;Neuromuscular re-education;Balance training;Therapeutic exercise;Therapeutic activities;Patient/family education;Orthotic Fit/Training;Manual techniques;Passive range of motion;Joint Manipulations;Scar mobilization;Compression bandaging    PT Next Visit Plan  DC per patient request    PT Home Exercise Plan  --    Consulted and Agree with Plan of Care  Patient       Patient will benefit from skilled therapeutic intervention in order to improve the following deficits and impairments:  Abnormal gait, Improper body mechanics, Pain, Decreased mobility, Decreased activity tolerance, Decreased range of motion, Decreased strength, Hypomobility, Impaired flexibility, Difficulty walking, Decreased balance  Visit Diagnosis: Acute pain of left knee  Stiffness of left knee, not elsewhere classified     Problem List Patient Active Problem List   Diagnosis Date Noted  . S/P left knee arthroscopy 01/09/2019 01/16/2019  . Acute medial meniscus  tear of left knee   . Old peripheral tear of lateral meniscus of left knee   . Left anterior cruciate ligament tear   . LUQ pain 03/19/2018  . Status post abdominal supracervical subtotal hysterectomy 10/02/2017  . Anemia due to chronic blood loss 06/07/2017  . Symptomatic anemia 06/03/2017  . Abnormal uterine bleeding (AUB) 02/12/2017  . Acute blood loss anemia 11/27/2016  . Thickened endometrium 11/23/2016  . Uterine leiomyoma 11/23/2016  . Menorrhagia with regular cycle 11/23/2016  . Alcoholic cirrhosis of liver ([place]) 07/07/2016  . Esophageal dysphagia 07/07/2016  . Esophageal varices without bleeding ([place]) 07/07/2016  . IDA (iron deficiency anemia) 06/09/2016  . PTSD (post-traumatic stress disorder) 04/20/2016  . OCD (obsessive compulsive disorder) 04/20/2016  . Severe recurrent major depression without psychotic features ([place]) 04/19/2016  . GERD (gastroesophageal reflux disease) 03/09/2016  . Depression 02/07/2016  . Bilateral edema of lower extremity   . History of colonic polyps   . Alcoholic cirrhosis of liver with ascites ([place])   . Anasarca 02/16/2015  . Alcoholic hepatitis with ascites   . Ascites   . Bleeding gastrointestinal   . AKI (acute kidney injury) ([place]) 12/10/2014  . Volume overload 12/10/2014  . Alcoholic cirrhosis ([place])   . Elevated bilirubin   . Nausea with vomiting   . Diarrhea   . Absolute anemia 12/02/2014  . Jaundice 12/02/2014  . Essential hypertension 12/02/2014  . Alcohol use disorder, moderate, dependence ([place]) 12/02/2014  . Abdominal pain 12/02/2014    4:16 PM, 03/03/19 Josue Hector PT DPT  Physical Therapist with [facility]  [phone] [place] [address] [place], [place], [address] Phone: [phone]   Fax:  [phone]  Name: Whitney Bush MRN: [id number] Date of Birth: 03/12/1970

## 2019-03-05 ENCOUNTER — Encounter (HOSPITAL_COMMUNITY): Payer: Medicaid Other | Admitting: Physical Therapy

## 2019-03-07 ENCOUNTER — Encounter (HOSPITAL_COMMUNITY): Payer: Medicaid Other

## 2019-03-10 ENCOUNTER — Other Ambulatory Visit: Payer: Self-pay | Admitting: Orthopedic Surgery

## 2019-03-10 ENCOUNTER — Encounter (HOSPITAL_COMMUNITY): Payer: Medicaid Other | Admitting: Physical Therapy

## 2019-03-10 NOTE — Telephone Encounter (Signed)
Patient called; said that at time of last office visit, 02/26/19, Dr Aline Brochure was to order a prescription for the pain 'in her knee/leg, her back, and also her toe.'  Pharmacy is [facility] in [place]. Please advise.

## 2019-03-10 NOTE — Telephone Encounter (Signed)
You state in note Start gabapentin 100 mg 3 times a day for tingling in the feet  But did not get sent in, pended.

## 2019-03-11 MED ORDER — GABAPENTIN 100 MG PO CAPS: 100.0000 mg | ORAL_CAPSULE | Freq: Three times a day (TID) | ORAL | 2 refills | Status: DC

## 2019-03-12 ENCOUNTER — Encounter (HOSPITAL_COMMUNITY): Payer: Medicaid Other

## 2019-03-14 ENCOUNTER — Encounter (HOSPITAL_COMMUNITY): Payer: Medicaid Other

## 2019-03-17 ENCOUNTER — Encounter (HOSPITAL_COMMUNITY): Payer: Medicaid Other | Admitting: Physical Therapy

## 2019-03-18 ENCOUNTER — Other Ambulatory Visit: Payer: Self-pay

## 2019-03-18 ENCOUNTER — Telehealth: Payer: Self-pay | Admitting: Internal Medicine

## 2019-03-18 DIAGNOSIS — Z79899 Other long term (current) drug therapy: Secondary | ICD-10-CM

## 2019-03-18 DIAGNOSIS — R1011 Right upper quadrant pain: Secondary | ICD-10-CM

## 2019-03-18 NOTE — Telephone Encounter (Signed)
Pt needs to speak with nurse. [phone]

## 2019-03-18 NOTE — Telephone Encounter (Signed)
Spoke with pt. Pt is aware if she is having the lightheadedness, sob ect, she is to go to the ED. Lab orders were placed and faxed to [facility]. Pt can have labs drawn at this time, pt reports no sob, lightheadedness.

## 2019-03-18 NOTE — Telephone Encounter (Signed)
If patient feels significant abd pain, lightheadedness or sob now, she should go to er.   Otherwise recommend stat cbc, cmet, lipase today.

## 2019-03-18 NOTE — Telephone Encounter (Signed)
Spoke with pt. Pt called back with a PR from her last apt 01/2019.  Pt is having sharp rt upper quad pain that comes and goes. Pt says that the pain gets to a level 9 at times. Pt's stool has turned dark/black x 1 week. Pt doesn't see any blood in the toilet or when she wipes. Pt does report that she gets lightheaded and has some SOB that comes and goes. Pt isn't taking iron pills or Pepto.

## 2019-03-19 ENCOUNTER — Encounter (HOSPITAL_COMMUNITY): Payer: Medicaid Other

## 2019-03-19 ENCOUNTER — Telehealth: Payer: Self-pay | Admitting: Orthopaedic Surgery

## 2019-03-19 DIAGNOSIS — M25562 Pain in left knee: Secondary | ICD-10-CM

## 2019-03-19 DIAGNOSIS — Z9889 Other specified postprocedural states: Secondary | ICD-10-CM

## 2019-03-19 DIAGNOSIS — G8929 Other chronic pain: Secondary | ICD-10-CM

## 2019-03-19 NOTE — Telephone Encounter (Signed)
Okay 

## 2019-03-19 NOTE — Telephone Encounter (Signed)
Ok to re order the physical therapy for her?  It was ordered in November and she went once

## 2019-03-19 NOTE — Telephone Encounter (Signed)
Patient called this morning stating she had missed several PT appointments but when she tried to schedule appt she was told she needed new order.  Could you do this for her please?

## 2019-03-21 ENCOUNTER — Encounter (HOSPITAL_COMMUNITY): Payer: Medicaid Other

## 2019-03-25 ENCOUNTER — Other Ambulatory Visit: Payer: Self-pay | Admitting: Gastroenterology

## 2019-04-03 ENCOUNTER — Other Ambulatory Visit: Payer: Self-pay

## 2019-04-03 ENCOUNTER — Ambulatory Visit (HOSPITAL_COMMUNITY): Payer: Medicaid Other | Attending: Orthopedic Surgery | Admitting: Physical Therapy

## 2019-04-03 ENCOUNTER — Encounter (HOSPITAL_COMMUNITY): Payer: Self-pay | Admitting: Physical Therapy

## 2019-04-03 ENCOUNTER — Other Ambulatory Visit (HOSPITAL_COMMUNITY)
Admission: RE | Admit: 2019-04-03 | Discharge: 2019-04-03 | Disposition: A | Discharge status: Home / Self Care | Payer: Medicaid Other | Source: Ambulatory Visit | Attending: Gastroenterology | Admitting: Gastroenterology

## 2019-04-03 DIAGNOSIS — M25562 Pain in left knee: Secondary | ICD-10-CM | POA: Diagnosis present

## 2019-04-03 DIAGNOSIS — Z79899 Other long term (current) drug therapy: Secondary | ICD-10-CM | POA: Diagnosis present

## 2019-04-03 DIAGNOSIS — M25662 Stiffness of left knee, not elsewhere classified: Secondary | ICD-10-CM | POA: Diagnosis present

## 2019-04-03 DIAGNOSIS — R1011 Right upper quadrant pain: Secondary | ICD-10-CM | POA: Insufficient documentation

## 2019-04-03 LAB — CBC WITH DIFFERENTIAL/PLATELET
Abs Immature Granulocytes: 0.03 10*3/uL (ref 0.00–0.07)
Basophils Absolute: 0 10*3/uL (ref 0.0–0.1)
Basophils Relative: 0 %
Eosinophils Absolute: 0.1 10*3/uL (ref 0.0–0.5)
Eosinophils Relative: 1 %
HCT: 41.7 % (ref 36.0–46.0)
Hemoglobin: 13.1 g/dL (ref 12.0–15.0)
Immature Granulocytes: 0 %
Lymphocytes Relative: 24 %
Lymphs Abs: 2.1 10*3/uL (ref 0.7–4.0)
MCH: 30.2 pg (ref 26.0–34.0)
MCHC: 31.4 g/dL (ref 30.0–36.0)
MCV: 96.1 fL (ref 80.0–100.0)
Monocytes Absolute: 0.7 10*3/uL (ref 0.1–1.0)
Monocytes Relative: 7 %
Neutro Abs: 6.1 10*3/uL (ref 1.7–7.7)
Neutrophils Relative %: 68 %
Platelets: 145 10*3/uL — ABNORMAL LOW (ref 150–400)
RBC: 4.34 MIL/uL (ref 3.87–5.11)
RDW: 15.7 % — ABNORMAL HIGH (ref 11.5–15.5)
WBC: 9 10*3/uL (ref 4.0–10.5)
nRBC: 0 % (ref 0.0–0.2)

## 2019-04-03 LAB — COMPREHENSIVE METABOLIC PANEL
ALT: 55 U/L — ABNORMAL HIGH (ref 0–44)
AST: 42 U/L — ABNORMAL HIGH (ref 15–41)
Albumin: 3.8 g/dL (ref 3.5–5.0)
Alkaline Phosphatase: 91 U/L (ref 38–126)
Anion gap: 13 (ref 5–15)
BUN: 16 mg/dL (ref 6–20)
CO2: 26 mmol/L (ref 22–32)
Calcium: 9.6 mg/dL (ref 8.9–10.3)
Chloride: 99 mmol/L (ref 98–111)
Creatinine, Ser: 0.9 mg/dL (ref 0.44–1.00)
GFR calc Af Amer: >60 mL/min (ref >60)
GFR calc non Af Amer: >60 mL/min (ref >60)
Glucose, Bld: 92 mg/dL (ref 70–99)
Potassium: 3.7 mmol/L (ref 3.5–5.1)
Sodium: 138 mmol/L (ref 135–145)
Total Bilirubin: 1.2 mg/dL (ref 0.3–1.2)
Total Protein: 8 g/dL (ref 6.5–8.1)

## 2019-04-03 LAB — LIPASE, BLOOD: Lipase: 38 U/L (ref 11–51)

## 2019-04-03 NOTE — Therapy (Signed)
[place], [place], [address] Phone: [phone]   Fax:  [phone]  Physical Therapy Evaluation  Patient Details  Name: Whitney Bush MRN: [id number] Date of Birth: August 17, 1970 Referring Provider (PT): Arther Abbott MD   Encounter Date: 04/03/2019  PT End of Session - 04/03/19 1041    Visit Number  1    Number of Visits  9    Date for PT Re-Evaluation  05/01/19    Authorization Type  Medicaid (3 visits requested. Check approval)    Authorization Time Period  04/03/19 - 05/01/19    Authorization - Visit Number  0    Authorization - Number of Visits  3    PT Start Time  0955    PT Stop Time  1030    PT Time Calculation (min)  35 min    Activity Tolerance  Patient tolerated treatment well    Behavior During Therapy  WFL for tasks assessed/performed       Past Medical History:  Diagnosis Date  . Alcohol abuse   . 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 Anemia   . Blood transfusion without reported diagnosis   . Boil 06/04/2015  . Chronic kidney disease   . Clotting disorder ([place])   . COPD (chronic obstructive pulmonary disease) ([place])   . Depression   . GERD (gastroesophageal reflux disease)   . GI (gastrointestinal bleed) 07/28/2015  . Heart disease   . Heart murmur   . Hyperlipidemia   . Hypertension   . Hypothyroidism   . Migraines   . OCD (obsessive compulsive disorder)   . Peripheral edema   . PTSD (post-traumatic stress disorder)   . Tubular adenoma     Past Surgical History:  Procedure Laterality Date  . ABCESS DRAINAGE     x5; neck, arm, chest, back  . BIOPSY  02/24/2015   Procedure: BIOPSY;  Surgeon: Danie Binder, MD;  [facility];  Service: Endoscopy;;  gastric bx's  . BIOPSY  12/25/2016   Procedure: BIOPSY;  Surgeon: Daneil Dolin, MD;  [facility];  Service: Endoscopy;;  gastric  . CESAREAN SECTION    . COLONOSCOPY WITH  PROPOFOL N/A 06/28/2015   Dr.Rourk- inadequate prep- One 6 mm polyp at the splenic flexure bx= tubular adenoma  . ESOPHAGEAL BANDING N/A 12/25/2016   Procedure: ESOPHAGEAL BANDING with Propofol;  Surgeon: Daneil Dolin, MD;  [facility];  Service: Endoscopy;  Laterality: N/A;  possible banding of varices  . ESOPHAGOGASTRODUODENOSCOPY (EGD) WITH PROPOFOL N/A 02/24/2015   SLF: Grade II esophageal varices Moderate portal hypertensive gastropathy Mild erosive gastritis anemia likely due to many factors: gastritis, gastropathy, coagulopathy, chronic disease  . ESOPHAGOGASTRODUODENOSCOPY (EGD) WITH PROPOFOL N/A 12/25/2016   Procedure: ESOPHAGOGASTRODUODENOSCOPY (EGD) WITH PROPOFOL;  Surgeon: Daneil Dolin, MD;  [facility];  Service: Endoscopy;  Laterality: N/A;  8:45am  . KNEE ARTHROSCOPY WITH MEDIAL MENISECTOMY Left 01/09/2019   Procedure: KNEE ARTHROSCOPY WITH MEDIAL MENISCECTOMY AND LATERAL MENISCECTOMY;  Surgeon: Carole Civil, MD;  Location: [facility];  Service: Orthopedics;  Laterality: Left;  Marland Kitchen MALONEY DILATION N/A 12/25/2016   Procedure: Venia Minks DILATION;  Surgeon: Daneil Dolin, MD;  [facility];  Service: Endoscopy;  Laterality: N/A;  . POLYPECTOMY  06/28/2015   Procedure: POLYPECTOMY;  Surgeon: Daneil Dolin, MD;  [facility];  Service:  Endoscopy;;  at splenic flexure  . SUPRACERVICAL ABDOMINAL HYSTERECTOMY N/A 10/02/2017   Procedure: HYSTERECTOMY SUPRACERVICAL ABDOMINAL;  Surgeon: Jonnie Kind, MD;  Location: [facility];  Service: Gynecology;  Laterality: N/A;    There were no vitals filed for this visit.   Subjective Assessment - 04/03/19 1000    Subjective  Patient presents to physical therapy with complaint of LT knee pain s/p LT knee arthroscopic surgery on 01/09/19. Patient reported that she is having a clicking or popping sensation on the right side of her left knee and that she is having difficulty making sharp turns. She  reported having some swelling when she stands on it a lot and not being able to bend it back all the way. Patient reported having a history of back pain which has been ongoing since before the surgery. She reported that she does have sensation changes in her left leg and that her MD is aware of this and reported that it is due to the cirrhosis of her liver.    Pertinent History  LT knee scope 01/09/19    Limitations  House hold activities;Walking    How long can you sit comfortably?  5-10 minutes    How long can you stand comfortably?  5-10 minutes    How long can you walk comfortably?  5-10 minutes    Patient Stated Goals  Have knee back to how it was    Currently in Pain?  Yes    Pain Score  8     Pain Location  Knee    Pain Orientation  Left    Pain Descriptors / Indicators  Aching    Pain Type  Surgical pain    Pain Onset  More than a month ago    Aggravating Factors   Bending    Pain Relieving Factors  Resting    Effect of Pain on Daily Activities  Moderately limits         [facility] PT Assessment - 04/03/19 0001      Assessment   Medical Diagnosis  Lt knee pain s/p arthroscope    Referring Provider (PT)  Arther Abbott MD    Onset Date/Surgical Date  01/09/19    Next MD Visit  --   Has one scheduled, but unknown when it is   Prior Therapy  Just for evaluation       Precautions   Precautions  None      Restrictions   Weight Bearing Restrictions  No      Balance Screen   Has the patient fallen in the past 6 months  Yes    How many times?  2    Has the patient had a decrease in activity level because of a fear of falling?   Yes    Is the patient reluctant to leave their home because of a fear of falling?   No      Home Environment   Living Environment  Private residence    Living Arrangements  Alone    Type of [place] Access  Level entry    [place]  Crutches;Walker - 2 wheels      Prior Function   Level of  Independence  Independent;Independent with basic ADLs      Cognition   Overall Cognitive Status  Within Functional Limits for tasks assessed      Observation/Other Assessments   Observations  Structural  bilateral knee valgus noted when patient laying supine. In supine noted left knee flexion at rest.       Sensation   Light Touch  Impaired by gross assessment    Additional Comments  Decreased sensation in the left, which is ongoing since before surgery      AROM   Right/Left Knee  Right;Left    Right Knee Flexion  123    Left Knee Extension  5    Left Knee Flexion  102      Strength   Strength Assessment Site  Hip;Knee;Ankle    Right/Left Hip  Right;Left    Right Hip Flexion  5/5    Right Hip Extension  4+/5    Right Hip ABduction  5/5    Left Hip Flexion  4+/5    Left Hip Extension  4/5    Left Hip ABduction  5/5    Right Knee Flexion  5/5    Right Knee Extension  5/5    Left Knee Flexion  4+/5    Left Knee Extension  4+/5    Right Ankle Dorsiflexion  5/5    Left Ankle Dorsiflexion  5/5      Palpation   Patella mobility  WFL    Palpation comment  Tender to palpation of medial LT knee joint line and through quadriceps near insertion site. Patient tender through lumbar spine at the lumbar spine, but reported no reproduction of knee pain.       Ambulation/Gait   Ambulation/Gait  Yes    Ambulation Distance (Feet)  452 Feet    Assistive device  None    Gait Pattern  Antalgic;Decreased stance time - left    Ambulation Surface  Level;Indoor    Gait Comments  2MWT      Static Standing Balance   Static Standing - Balance Support  No upper extremity supported    Static Standing Balance -  Activities   Single Leg Stance - Right Leg;Single Leg Stance - Left Leg    Static Standing - Comment/# of Minutes  RT: 4 seconds; LT: 10 seconds                Objective measurements completed on examination: See above findings.              PT Education - 04/03/19  1040    Education Details  Discussed examination findings, POC, and initial HEP.    Person(s) Educated  Patient    Methods  Explanation;Handout    Comprehension  Verbalized understanding       PT Short Term Goals - 04/03/19 1043      PT SHORT TERM GOAL #1   Title  Patient will be independent with initial HEP to improve functional outcomes    Time  2    Period  Weeks    Status  Not Met    Target Date  04/17/19      PT SHORT TERM GOAL #2   Title  Patient will report that her LT knee pain has been no greater than a 6/10 over a 1 week period indicating improved tolerance to daily activities.    Time  2    Period  Weeks    Status  New    Target Date  04/17/19        PT Long Term Goals - 04/03/19 1044      PT LONG TERM GOAL #1   Title  Patient will have LT knee AROM 0-120  degrees to improve functional mobility and facilitate squatting to pick up items from floor.    Time  4    Period  Weeks    Status  On-going    Target Date  05/01/19      PT LONG TERM GOAL #2   Title  Patient will have equal to or > 4+/5 MMT throughout LLE to improve ability to perform functional mobility, stair ambulation and ADLs.    Time  4    Period  Weeks    Status  New    Target Date  05/01/19      PT LONG TERM GOAL #3   Title  Patient will report at least 75% overall improvement in subjective complaint to indicate improvement in ability to perform ADLs.    Time  4    Period  Weeks    Status  New    Target Date  05/01/19      PT LONG TERM GOAL #4   Title  Patient will report that her LT knee pain has been no greater than a 3/10 over a 1 week period indicating improved tolerance to daily activities.    Time  4    Period  Weeks    Status  New    Target Date  05/01/19      PT LONG TERM GOAL #5   Title  Patient will demonstrate SLS of at least 15 seconds indicating improved balance and muscular stability.    Time  4    Period  Weeks    Status  New    Target Date  05/01/19              Plan - 04/03/19 1101    Clinical Impression Statement  Patient presented to outpatient physical therapy with primary complaint of LT knee pain and a history of LT knee arthroscopic surgery on 01/09/19. Upon examination, patient demonstrated decreased LT knee AROM, decreased strength. Also noted gait deviations with ambulation as well as decreased muscular stability noted with SLS. Patient would benefit from skilled physical therapy in order to address the abovementioned deficits and help patient return to PLOF.    Personal Factors and Comorbidities  Time since onset of injury/illness/exacerbation;Comorbidity 3+    Comorbidities  Cirrhosis of the liver, HTN, depression    Stability/Clinical Decision Making  Stable/Uncomplicated    Clinical Decision Making  Low    Rehab Potential  Fair    PT Frequency  2x / week    PT Duration  4 weeks    PT Treatment/Interventions  ADLs/Self Care Home Management;Cryotherapy;Moist Heat;Electrical Stimulation;Gait training;Stair training;DME Instruction;Functional mobility training;Neuromuscular re-education;Balance training;Therapeutic exercise;Therapeutic activities;Patient/family education;Orthotic Fit/Training;Manual techniques;Passive range of motion;Joint Manipulations;Scar mobilization;Compression bandaging;Dry needling;Energy conservation;Taping    PT Next Visit Plan  Review eval and goals and initial HEP. Focus on LT knee mobility initially progress to functional strengthening and balance. Add knee ROM exercises to HEP.    PT Home Exercise Plan  04/03/19: Supine hip flexor stretch 3x30'' daily    Consulted and Agree with Plan of Care  Patient       Patient will benefit from skilled therapeutic intervention in order to improve the following deficits and impairments:  Abnormal gait, Improper body mechanics, Pain, Decreased mobility, Decreased activity tolerance, Decreased range of motion, Decreased strength, Hypomobility, Impaired flexibility,  Difficulty walking, Decreased balance  Visit Diagnosis: Acute pain of left knee  Stiffness of left knee, not elsewhere classified     Problem List Patient Active Problem List  Diagnosis Date Noted  . S/P left knee arthroscopy 01/09/2019 01/16/2019  . Acute medial meniscus tear of left knee   . Old peripheral tear of lateral meniscus of left knee   . Left anterior cruciate ligament tear   . LUQ pain 03/19/2018  . Status post abdominal supracervical subtotal hysterectomy 10/02/2017  . Anemia due to chronic blood loss 06/07/2017  . Symptomatic anemia 06/03/2017  . Abnormal uterine bleeding (AUB) 02/12/2017  . Acute blood loss anemia 11/27/2016  . Thickened endometrium 11/23/2016  . Uterine leiomyoma 11/23/2016  . Menorrhagia with regular cycle 11/23/2016  . Alcoholic cirrhosis of liver ([place]) 07/07/2016  . Esophageal dysphagia 07/07/2016  . Esophageal varices without bleeding ([place]) 07/07/2016  . IDA (iron deficiency anemia) 06/09/2016  . PTSD (post-traumatic stress disorder) 04/20/2016  . OCD (obsessive compulsive disorder) 04/20/2016  . Severe recurrent major depression without psychotic features ([place]) 04/19/2016  . GERD (gastroesophageal reflux disease) 03/09/2016  . Depression 02/07/2016  . Bilateral edema of lower extremity   . History of colonic polyps   . Alcoholic cirrhosis of liver with ascites ([place])   . Anasarca 02/16/2015  . Alcoholic hepatitis with ascites   . Ascites   . Bleeding gastrointestinal   . AKI (acute kidney injury) ([place]) 12/10/2014  . Volume overload 12/10/2014  . Alcoholic cirrhosis ([place])   . Elevated bilirubin   . Nausea with vomiting   . Diarrhea   . Absolute anemia 12/02/2014  . Jaundice 12/02/2014  . Essential hypertension 12/02/2014  . Alcohol use disorder, moderate, dependence ([place]) 12/02/2014  . Abdominal pain 12/02/2014   Clarene Critchley PT, DPT 11:07 AM, 04/03/19 [place], [place], [address] Phone: [phone]   Fax:  [phone]  Name: ALAIRA LEVEL MRN: [id number] Date of Birth: Apr 30, 1970

## 2019-04-03 NOTE — Patient Instructions (Signed)
HIP: Flexors - Supine    Lie on edge of surface. Place leg off the surface, allow knee to bend. Bring other knee toward chest. Hold __30_ seconds. _3__ reps per set, __1-2_ sets per day, __7_ days per week   Copyright  VHI. All rights reserved.

## 2019-04-08 ENCOUNTER — Other Ambulatory Visit: Payer: Self-pay

## 2019-04-08 ENCOUNTER — Encounter (HOSPITAL_COMMUNITY): Payer: Self-pay

## 2019-04-08 ENCOUNTER — Ambulatory Visit (HOSPITAL_COMMUNITY): Payer: Medicaid Other

## 2019-04-08 DIAGNOSIS — M25562 Pain in left knee: Secondary | ICD-10-CM

## 2019-04-08 DIAGNOSIS — M25662 Stiffness of left knee, not elsewhere classified: Secondary | ICD-10-CM

## 2019-04-08 NOTE — Therapy (Signed)
[place], [place], [address] Phone: [phone]   Fax:  [phone]  Physical Therapy Treatment  Patient Details  Name: Whitney Bush MRN: [id number] Date of Birth: Apr 10, 1970 Referring Provider (PT): Arther Abbott MD   Encounter Date: 04/08/2019  PT End of Session - 04/08/19 0922    Visit Number  2    Number of Visits  9    Date for PT Re-Evaluation  05/01/19    Authorization Type  Medicaid (3 visits approved 2/8-->04/20/19)    Authorization Time Period  04/03/19 - 05/01/19    Authorization - Visit Number  1    Authorization - Number of Visits  3    PT Start Time  0918    PT Stop Time  0956    PT Time Calculation (min)  38 min    Activity Tolerance  Patient tolerated treatment well;Patient limited by pain;No increased pain   Pain decreased to 5/10 at EOS, was 9/10   Behavior During Therapy  [facility] for tasks assessed/performed       Past Medical History:  Diagnosis Date  . Alcohol abuse   . 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 Anemia   . Blood transfusion without reported diagnosis   . Boil 06/04/2015  . Chronic kidney disease   . Clotting disorder ([place])   . COPD (chronic obstructive pulmonary disease) ([place])   . Depression   . GERD (gastroesophageal reflux disease)   . GI (gastrointestinal bleed) 07/28/2015  . Heart disease   . Heart murmur   . Hyperlipidemia   . Hypertension   . Hypothyroidism   . Migraines   . OCD (obsessive compulsive disorder)   . Peripheral edema   . PTSD (post-traumatic stress disorder)   . Tubular adenoma     Past Surgical History:  Procedure Laterality Date  . ABCESS DRAINAGE     x5; neck, arm, chest, back  . BIOPSY  02/24/2015   Procedure: BIOPSY;  Surgeon: Danie Binder, MD;  [facility];  Service: Endoscopy;;  gastric bx's  . BIOPSY  12/25/2016   Procedure: BIOPSY;  Surgeon: Daneil Dolin, MD;  [facility];   Service: Endoscopy;;  gastric  . CESAREAN SECTION    . COLONOSCOPY WITH PROPOFOL N/A 06/28/2015   Dr.Rourk- inadequate prep- One 6 mm polyp at the splenic flexure bx= tubular adenoma  . ESOPHAGEAL BANDING N/A 12/25/2016   Procedure: ESOPHAGEAL BANDING with Propofol;  Surgeon: Daneil Dolin, MD;  [facility];  Service: Endoscopy;  Laterality: N/A;  possible banding of varices  . ESOPHAGOGASTRODUODENOSCOPY (EGD) WITH PROPOFOL N/A 02/24/2015   SLF: Grade II esophageal varices Moderate portal hypertensive gastropathy Mild erosive gastritis anemia likely due to many factors: gastritis, gastropathy, coagulopathy, chronic disease  . ESOPHAGOGASTRODUODENOSCOPY (EGD) WITH PROPOFOL N/A 12/25/2016   Procedure: ESOPHAGOGASTRODUODENOSCOPY (EGD) WITH PROPOFOL;  Surgeon: Daneil Dolin, MD;  [facility];  Service: Endoscopy;  Laterality: N/A;  8:45am  . KNEE ARTHROSCOPY WITH MEDIAL MENISECTOMY Left 01/09/2019   Procedure: KNEE ARTHROSCOPY WITH MEDIAL MENISCECTOMY AND LATERAL MENISCECTOMY;  Surgeon: Carole Civil, MD;  Location: [facility];  Service: Orthopedics;  Laterality: Left;  Marland Kitchen MALONEY DILATION N/A 12/25/2016   Procedure: Venia Minks DILATION;  Surgeon: Daneil Dolin, MD;  [facility];  Service: Endoscopy;  Laterality: N/A;  . POLYPECTOMY  06/28/2015   Procedure: POLYPECTOMY;  Surgeon: Daneil Dolin, MD;  [facility];  Service: Endoscopy;;  at splenic flexure  . SUPRACERVICAL ABDOMINAL HYSTERECTOMY N/A 10/02/2017   Procedure: HYSTERECTOMY SUPRACERVICAL ABDOMINAL;  Surgeon: Jonnie Kind, MD;  Location: [facility];  Service: Gynecology;  Laterality: N/A;    There were no vitals filed for this visit.  Subjective Assessment - 04/08/19 0919    Subjective  Pt reports standing for 4 hours with community service and reports increased pain and swelling.  Reports she has began the stretches at home.    Pertinent History  LT knee scope 01/09/19    Patient Stated  Goals  Have knee back to how it was    Currently in Pain?  Yes    Pain Score  9     Pain Location  Knee    Pain Orientation  Left    Pain Descriptors / Indicators  Aching;Shooting    Pain Type  Surgical pain    Pain Onset  More than a month ago    Pain Frequency  Constant    Aggravating Factors   Bending    Pain Relieving Factors  resting    Effect of Pain on Daily Activities  modersately limits                       [facility] Adult PT Treatment/Exercise - 04/08/19 0001      Exercises   Exercises  Knee/Hip      Knee/Hip Exercises: Stretches   Quad Stretch  Left;3 reps;30 seconds    Quad Stretch Limitations  prone with rope    Hip Flexor Stretch  3 reps;30 seconds;Left    Hip Flexor Stretch Limitations  towel assistance    Knee: Self-Stretch to increase Flexion  5 reps;10 seconds;Left    Knee: Self-Stretch Limitations  knee drive on 70JG step      Knee/Hip Exercises: Supine   Quad Sets  10 reps    Quad Sets Limitations  3" holds    Heel Slides  10 reps    Knee Extension  AROM    Knee Extension Limitations  3    Knee Flexion  AROM    Knee Flexion Limitations  107             PT Education - 04/08/19 0932    Education Details  Reviewed goals, assured compliance with HEP with min cueing for form to reduce LBP with thomas stretch.    Person(s) Educated  Patient    Methods  Explanation;Demonstration    Comprehension  Verbalized understanding       PT Short Term Goals - 04/03/19 1043      PT SHORT TERM GOAL #1   Title  Patient will be independent with initial HEP to improve functional outcomes    Time  2    Period  Weeks    Status  Not Met    Target Date  04/17/19      PT SHORT TERM GOAL #2   Title  Patient will report that her LT knee pain has been no greater than a 6/10 over a 1 week period indicating improved tolerance to daily activities.    Time  2    Period  Weeks    Status  New    Target Date  04/17/19        PT Long Term Goals -  04/03/19 1044      PT LONG TERM GOAL #1   Title  Patient will have LT knee AROM 0-120 degrees to improve functional mobility and facilitate squatting to pick up items from floor.    Time  4    Period  Weeks    Status  On-going    Target Date  05/01/19      PT LONG TERM GOAL #2   Title  Patient will have equal to or > 4+/5 MMT throughout LLE to improve ability to perform functional mobility, stair ambulation and ADLs.    Time  4    Period  Weeks    Status  New    Target Date  05/01/19      PT LONG TERM GOAL #3   Title  Patient will report at least 75% overall improvement in subjective complaint to indicate improvement in ability to perform ADLs.    Time  4    Period  Weeks    Status  New    Target Date  05/01/19      PT LONG TERM GOAL #4   Title  Patient will report that her LT knee pain has been no greater than a 3/10 over a 1 week period indicating improved tolerance to daily activities.    Time  4    Period  Weeks    Status  New    Target Date  05/01/19      PT LONG TERM GOAL #5   Title  Patient will demonstrate SLS of at least 15 seconds indicating improved balance and muscular stability.    Time  4    Period  Weeks    Status  New    Target Date  05/01/19            Plan - 04/08/19 1000    Clinical Impression Statement  Reviewed goals and assured compliance with current HEP.  Pt able to verbalized hold time and demonstrate form, encouraged to bring Rt knee to chest during thomas stretch to reduce increased lordosis for LBP control.  Session focus wiht knee mobility with additional exercises added to HEP.  Pt able to demonstrate appropriate form wiht additional exercises, given printout for carry-over.  Pt did stated she would like a note for community service to reduce standing time for pain control, encouraged to call MD for note.  Reviewed RICE technqiues and encouraged increased ice not heat application for edema control.    Personal Factors and Comorbidities  Time  since onset of injury/illness/exacerbation;Comorbidity 3+    Comorbidities  Cirrhosis of the liver, HTN, depression    Examination-Activity Limitations  Bend;Locomotion Level;Squat;Transfers;Stand    Examination-Participation Restrictions  Community Activity;Other    Stability/Clinical Decision Making  Stable/Uncomplicated    Clinical Decision Making  Low    Rehab Potential  Fair    PT Frequency  2x / week    PT Duration  4 weeks    PT Treatment/Interventions  ADLs/Self Care Home Management;Cryotherapy;Moist Heat;Electrical Stimulation;Gait training;Stair training;DME Instruction;Functional mobility training;Neuromuscular re-education;Balance training;Therapeutic exercise;Therapeutic activities;Patient/family education;Orthotic Fit/Training;Manual techniques;Passive range of motion;Joint Manipulations;Scar mobilization;Compression bandaging;Dry needling;Energy conservation;Taping    PT Next Visit Plan  Review compliance wiht HEP.  Focus on LT knee mobility initially progress to functional strengthening and balance. Add knee ROM exercises to HEP.    PT Home Exercise Plan  04/03/19: Supine hip flexor stretch 3x30'' daily; 04/08/19: quad set, heel slide, prone quad stretch       Patient will benefit from skilled therapeutic intervention in order to improve the following deficits and impairments:  Abnormal gait, Improper  body mechanics, Pain, Decreased mobility, Decreased activity tolerance, Decreased range of motion, Decreased strength, Hypomobility, Impaired flexibility, Difficulty walking, Decreased balance  Visit Diagnosis: Stiffness of left knee, not elsewhere classified  Acute pain of left knee     Problem List Patient Active Problem List   Diagnosis Date Noted  . S/P left knee arthroscopy 01/09/2019 01/16/2019  . Acute medial meniscus tear of left knee   . Old peripheral tear of lateral meniscus of left knee   . Left anterior cruciate ligament tear   . LUQ pain 03/19/2018  . Status  post abdominal supracervical subtotal hysterectomy 10/02/2017  . Anemia due to chronic blood loss 06/07/2017  . Symptomatic anemia 06/03/2017  . Abnormal uterine bleeding (AUB) 02/12/2017  . Acute blood loss anemia 11/27/2016  . Thickened endometrium 11/23/2016  . Uterine leiomyoma 11/23/2016  . Menorrhagia with regular cycle 11/23/2016  . Alcoholic cirrhosis of liver ([place]) 07/07/2016  . Esophageal dysphagia 07/07/2016  . Esophageal varices without bleeding ([place]) 07/07/2016  . IDA (iron deficiency anemia) 06/09/2016  . PTSD (post-traumatic stress disorder) 04/20/2016  . OCD (obsessive compulsive disorder) 04/20/2016  . Severe recurrent major depression without psychotic features ([place]) 04/19/2016  . GERD (gastroesophageal reflux disease) 03/09/2016  . Depression 02/07/2016  . Bilateral edema of lower extremity   . History of colonic polyps   . Alcoholic cirrhosis of liver with ascites ([place])   . Anasarca 02/16/2015  . Alcoholic hepatitis with ascites   . Ascites   . Bleeding gastrointestinal   . AKI (acute kidney injury) ([place]) 12/10/2014  . Volume overload 12/10/2014  . Alcoholic cirrhosis ([place])   . Elevated bilirubin   . Nausea with vomiting   . Diarrhea   . Absolute anemia 12/02/2014  . Jaundice 12/02/2014  . Essential hypertension 12/02/2014  . Alcohol use disorder, moderate, dependence ([place]) 12/02/2014  . Abdominal pain 12/02/2014   Ihor Austin, [place]; [facility] [phone]  Aldona Lento 04/08/2019, 10:10 AM  [place], [place], [address] Phone: [phone]   Fax:  [phone]  Name: Whitney Bush MRN: [id number] Date of Birth: December 27, 1970

## 2019-04-08 NOTE — Patient Instructions (Signed)
Quad Set    With other leg bent, foot flat, slowly tighten muscles on thigh of straight leg while counting out loud to 5". Repeat with other leg. Repeat 10 times. Do 2 sessions per day.  http://gt2.exer.us/276   Copyright  VHI. All rights reserved.   Heel Slides    Squeeze pelvic floor and hold. Slide left heel along bed towards bottom.  Hold for 5 seconds. Slide back to flat knee position. Repeat 10 times.  Do 2 times a day. Repeat with other leg.  Copyright  VHI. All rights reserved.   KNEE: Quadriceps - Prone    Place strap around ankle. Bring ankle toward buttocks. Press hip into surface.  Hold 30 seconds. 3 reps per set, 2 sets per day, 4 days per week  Copyright  VHI. All rights reserved.

## 2019-04-10 ENCOUNTER — Telehealth (HOSPITAL_COMMUNITY): Payer: Self-pay

## 2019-04-10 ENCOUNTER — Ambulatory Visit (HOSPITAL_COMMUNITY): Payer: Medicaid Other

## 2019-04-10 ENCOUNTER — Encounter (HOSPITAL_COMMUNITY): Payer: Self-pay

## 2019-04-10 ENCOUNTER — Other Ambulatory Visit: Payer: Self-pay

## 2019-04-10 DIAGNOSIS — M25662 Stiffness of left knee, not elsewhere classified: Secondary | ICD-10-CM

## 2019-04-10 DIAGNOSIS — M25562 Pain in left knee: Secondary | ICD-10-CM

## 2019-04-10 NOTE — Patient Instructions (Signed)
Straight Leg Raise    Tighten stomach and slowly raise locked right leg ____ inches from floor. Repeat 10 times per set. Do 2 sets per day.  http://orth.exer.us/1103   Copyright  VHI. All rights reserved.

## 2019-04-10 NOTE — Therapy (Signed)
[place], [place], [address] Phone: [phone]   Fax:  [phone]  Physical Therapy Treatment  Patient Details  Name: Whitney Bush MRN: [id number] Date of Birth: 03-02-1970 Referring Provider (PT): Arther Abbott MD   Encounter Date: 04/10/2019  PT End of Session - 04/10/19 0925    Visit Number  3    Number of Visits  9    Date for PT Re-Evaluation  05/01/19    Authorization Type  Medicaid (3 visits approved 2/8-->04/20/19)    Authorization Time Period  04/03/19 - 05/01/19    Authorization - Visit Number  2    Authorization - Number of Visits  3    PT Start Time  [id number]   4' on bike, not included with charges   PT Stop Time  1006    PT Time Calculation (min)  42 min    Activity Tolerance  Patient tolerated treatment well;No increased pain    Behavior During Therapy  WFL for tasks assessed/performed       Past Medical History:  Diagnosis Date  . Alcohol abuse   . 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 Anemia   . Blood transfusion without reported diagnosis   . Boil 06/04/2015  . Chronic kidney disease   . Clotting disorder ([place])   . COPD (chronic obstructive pulmonary disease) ([place])   . Depression   . GERD (gastroesophageal reflux disease)   . GI (gastrointestinal bleed) 07/28/2015  . Heart disease   . Heart murmur   . Hyperlipidemia   . Hypertension   . Hypothyroidism   . Migraines   . OCD (obsessive compulsive disorder)   . Peripheral edema   . PTSD (post-traumatic stress disorder)   . Tubular adenoma     Past Surgical History:  Procedure Laterality Date  . ABCESS DRAINAGE     x5; neck, arm, chest, back  . BIOPSY  02/24/2015   Procedure: BIOPSY;  Surgeon: Danie Binder, MD;  [facility];  Service: Endoscopy;;  gastric bx's  . BIOPSY  12/25/2016   Procedure: BIOPSY;  Surgeon: Daneil Dolin, MD;  [facility];  Service: Endoscopy;;   gastric  . CESAREAN SECTION    . COLONOSCOPY WITH PROPOFOL N/A 06/28/2015   Dr.Rourk- inadequate prep- One 6 mm polyp at the splenic flexure bx= tubular adenoma  . ESOPHAGEAL BANDING N/A 12/25/2016   Procedure: ESOPHAGEAL BANDING with Propofol;  Surgeon: Daneil Dolin, MD;  [facility];  Service: Endoscopy;  Laterality: N/A;  possible banding of varices  . ESOPHAGOGASTRODUODENOSCOPY (EGD) WITH PROPOFOL N/A 02/24/2015   SLF: Grade II esophageal varices Moderate portal hypertensive gastropathy Mild erosive gastritis anemia likely due to many factors: gastritis, gastropathy, coagulopathy, chronic disease  . ESOPHAGOGASTRODUODENOSCOPY (EGD) WITH PROPOFOL N/A 12/25/2016   Procedure: ESOPHAGOGASTRODUODENOSCOPY (EGD) WITH PROPOFOL;  Surgeon: Daneil Dolin, MD;  [facility];  Service: Endoscopy;  Laterality: N/A;  8:45am  . KNEE ARTHROSCOPY WITH MEDIAL MENISECTOMY Left 01/09/2019   Procedure: KNEE ARTHROSCOPY WITH MEDIAL MENISCECTOMY AND LATERAL MENISCECTOMY;  Surgeon: Carole Civil, MD;  [facility];  Service: Orthopedics;  Laterality: Left;  Marland Kitchen MALONEY DILATION N/A 12/25/2016   Procedure: Venia Minks DILATION;  Surgeon: Daneil Dolin, MD;  [facility];  Service: Endoscopy;  Laterality: N/A;  . POLYPECTOMY  06/28/2015   Procedure: POLYPECTOMY;  Surgeon: Cristopher Estimable  Rourk, MD;  [facility];  Service: Endoscopy;;  at splenic flexure  . SUPRACERVICAL ABDOMINAL HYSTERECTOMY N/A 10/02/2017   Procedure: HYSTERECTOMY SUPRACERVICAL ABDOMINAL;  Surgeon: Jonnie Kind, MD;  [facility];  Service: Gynecology;  Laterality: N/A;    There were no vitals filed for this visit.  Subjective Assessment - 04/10/19 0924    Subjective  Pt stated her knee feels looser, no reports of pain today.  Reports complaince with HEP daily.  Has began ice, reports coldness down to the bone.    Pertinent History  LT knee scope 01/09/19    Patient Stated Goals  Have knee back  to how it was    Currently in Pain?  No/denies                       [facility] Adult PT Treatment/Exercise - 04/10/19 0001      Exercises   Exercises  Knee/Hip      Knee/Hip Exercises: Stretches   Active Hamstring Stretch  30 seconds;2 reps    Active Hamstring Stretch Limitations  supine hands behind knee    Knee: Self-Stretch to increase Flexion  5 reps;10 seconds;Left    Knee: Self-Stretch Limitations  knee drive on 20UR step      Knee/Hip Exercises: Aerobic   Stationary Bike  seat 11 x 4' for mobility      Knee/Hip Exercises: Standing   Terminal Knee Extension  10 reps;Theraband    Theraband Level (Terminal Knee Extension)  Level 3 (Green)    Terminal Knee Extension Limitations  5"    Other Standing Knee Exercises  tandem stance 1x 30" solid surface; 1x 30" on foam      Knee/Hip Exercises: Supine   Quad Sets  10 reps    Quad Sets Limitations  3" holds    Short Arc Quad Sets  10 reps    Heel Slides  10 reps    Straight Leg Raises  10 reps;Left    Straight Leg Raises Limitations  quad set prior raise    Knee Extension  AROM    Knee Extension Limitations  1    Knee Flexion  AROM    Knee Flexion Limitations  116             PT Education - 04/10/19 1030    Education Details  Discussed benefits of wearing tennis shoes for arch support for knee pain control.  Ice for pain and edema control, 10-20 minutes with layer of fabric between.  Pt c/o dizziness with transitions.  Discussed importance of taking medication at regular times for vitals.    Person(s) Educated  Patient    Methods  Explanation    Comprehension  Verbalized understanding       PT Short Term Goals - 04/03/19 1043      PT SHORT TERM GOAL #1   Title  Patient will be independent with initial HEP to improve functional outcomes    Time  2    Period  Weeks    Status  Not Met    Target Date  04/17/19      PT SHORT TERM GOAL #2   Title  Patient will report that her LT knee pain has been no  greater than a 6/10 over a 1 week period indicating improved tolerance to daily activities.    Time  2    Period  Weeks    Status  New    Target Date  04/17/19        PT Long Term Goals - 04/03/19 1044      PT LONG TERM GOAL #1   Title  Patient will have LT knee AROM 0-120 degrees to improve functional mobility and facilitate squatting to pick up items from floor.    Time  4    Period  Weeks    Status  On-going    Target Date  05/01/19      PT LONG TERM GOAL #2   Title  Patient will have equal to or > 4+/5 MMT throughout LLE to improve ability to perform functional mobility, stair ambulation and ADLs.    Time  4    Period  Weeks    Status  New    Target Date  05/01/19      PT LONG TERM GOAL #3   Title  Patient will report at least 75% overall improvement in subjective complaint to indicate improvement in ability to perform ADLs.    Time  4    Period  Weeks    Status  New    Target Date  05/01/19      PT LONG TERM GOAL #4   Title  Patient will report that her LT knee pain has been no greater than a 3/10 over a 1 week period indicating improved tolerance to daily activities.    Time  4    Period  Weeks    Status  New    Target Date  05/01/19      PT LONG TERM GOAL #5   Title  Patient will demonstrate SLS of at least 15 seconds indicating improved balance and muscular stability.    Time  4    Period  Weeks    Status  New    Target Date  05/01/19            Plan - 04/10/19 1013    Clinical Impression Statement  Session focus on knee mobility and strengthening.  Added quad strengthening and bike for ROM.  Cueing for quad set prior SLR to address extension lag.  Added SLR to HEP for additional quad strengthening.  Pt reports 2 falls since surgery so added balance into POC.  Pt educated on benefits of wearing tennis shoes for arch support to support knee.  Encouraged increased application of ice for edema control.  Pt reports some dizziness following transition of  supine to sit, vitals taken with slightly high BP at 151/100 mmHg.  Dizziness resolved prior EOS.  AROM improving to 1-116 degrees (was 3-107 last session.)    Personal Factors and Comorbidities  Time since onset of injury/illness/exacerbation;Comorbidity 3+    Comorbidities  Cirrhosis of the liver, HTN, depression, clotting disorder    Examination-Activity Limitations  Bend;Locomotion Level;Squat;Transfers;Stand    Examination-Participation Restrictions  Community Activity;Other    Stability/Clinical Decision Making  Stable/Uncomplicated    Clinical Decision Making  Low    Rehab Potential  Fair    PT Frequency  2x / week    PT Duration  4 weeks    PT Treatment/Interventions  ADLs/Self Care Home Management;Cryotherapy;Moist Heat;Electrical Stimulation;Gait training;Stair training;DME Instruction;Functional mobility training;Neuromuscular re-education;Balance training;Therapeutic exercise;Therapeutic activities;Patient/family education;Orthotic Fit/Training;Manual techniques;Passive range of motion;Joint Manipulations;Scar mobilization;Compression bandaging;Dry needling;Energy conservation;Taping    PT Next Visit Plan  Medicaid request for further sessions.  Focus on LT knee mobility initially progress to functional strengthening and balance.    PT Home Exercise Plan  04/03/19: Supine hip flexor stretch 3x30'' daily; 04/08/19: quad  set, heel slide, prone quad stretch; 04/10/19: SLR       Patient will benefit from skilled therapeutic intervention in order to improve the following deficits and impairments:  Abnormal gait, Improper body mechanics, Pain, Decreased mobility, Decreased activity tolerance, Decreased range of motion, Decreased strength, Hypomobility, Impaired flexibility, Difficulty walking, Decreased balance  Visit Diagnosis: Acute pain of left knee  Stiffness of left knee, not elsewhere classified     Problem List Patient Active Problem List   Diagnosis Date Noted  . S/P left knee  arthroscopy 01/09/2019 01/16/2019  . Acute medial meniscus tear of left knee   . Old peripheral tear of lateral meniscus of left knee   . Left anterior cruciate ligament tear   . LUQ pain 03/19/2018  . Status post abdominal supracervical subtotal hysterectomy 10/02/2017  . Anemia due to chronic blood loss 06/07/2017  . Symptomatic anemia 06/03/2017  . Abnormal uterine bleeding (AUB) 02/12/2017  . Acute blood loss anemia 11/27/2016  . Thickened endometrium 11/23/2016  . Uterine leiomyoma 11/23/2016  . Menorrhagia with regular cycle 11/23/2016  . Alcoholic cirrhosis of liver ([place]) 07/07/2016  . Esophageal dysphagia 07/07/2016  . Esophageal varices without bleeding ([place]) 07/07/2016  . IDA (iron deficiency anemia) 06/09/2016  . PTSD (post-traumatic stress disorder) 04/20/2016  . OCD (obsessive compulsive disorder) 04/20/2016  . Severe recurrent major depression without psychotic features ([place]) 04/19/2016  . GERD (gastroesophageal reflux disease) 03/09/2016  . Depression 02/07/2016  . Bilateral edema of lower extremity   . History of colonic polyps   . Alcoholic cirrhosis of liver with ascites ([place])   . Anasarca 02/16/2015  . Alcoholic hepatitis with ascites   . Ascites   . Bleeding gastrointestinal   . AKI (acute kidney injury) ([place]) 12/10/2014  . Volume overload 12/10/2014  . Alcoholic cirrhosis ([place])   . Elevated bilirubin   . Nausea with vomiting   . Diarrhea   . Absolute anemia 12/02/2014  . Jaundice 12/02/2014  . Essential hypertension 12/02/2014  . Alcohol use disorder, moderate, dependence ([place]) 12/02/2014  . Abdominal pain 12/02/2014   Ihor Austin, [place]; [facility]  Aldona Lento 04/10/2019, 10:33 AM  [place], [place], [address] Phone: [phone]   Fax:  [phone]  Name: Whitney Bush MRN: [id number] Date of Birth: [birth year]

## 2019-04-10 NOTE — Telephone Encounter (Signed)
Lack of transpoLack of transportation - [facility] did not come to pick her uprtation - [facility] did not come to pick her up

## 2019-04-15 ENCOUNTER — Encounter (HOSPITAL_COMMUNITY): Payer: Self-pay | Admitting: Physical Therapy

## 2019-04-15 ENCOUNTER — Ambulatory Visit (HOSPITAL_COMMUNITY): Payer: Medicaid Other | Admitting: Physical Therapy

## 2019-04-15 ENCOUNTER — Other Ambulatory Visit: Payer: Self-pay

## 2019-04-15 DIAGNOSIS — M25562 Pain in left knee: Secondary | ICD-10-CM

## 2019-04-15 DIAGNOSIS — M25662 Stiffness of left knee, not elsewhere classified: Secondary | ICD-10-CM

## 2019-04-15 NOTE — Therapy (Addendum)
[place], [place], [address] Phone: [phone]   Fax:  [phone]  Physical Therapy Treatment  Patient Details  Name: Whitney Bush MRN: [id number] Date of Birth: 1970/06/20 Referring Provider (PT): Arther Abbott MD   Encounter Date: 04/15/2019  PT End of Session - 04/15/19 1056    Visit Number  4    Number of Visits  9    Date for PT Re-Evaluation  05/01/19    Authorization Type  Medicaid (3 visits approved 2/8-->04/20/19); Requested additional visits check approval    Authorization Time Period  04/03/19 - 05/01/19    Authorization - Visit Number  3    Authorization - Number of Visits  3    PT Start Time  1030    PT Stop Time  1108    PT Time Calculation (min)  38 min    Activity Tolerance  Patient tolerated treatment well;No increased pain    Behavior During Therapy  WFL for tasks assessed/performed       Past Medical History:  Diagnosis Date  . Alcohol abuse   . 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 Anemia   . Blood transfusion without reported diagnosis   . Boil 06/04/2015  . Chronic kidney disease   . Clotting disorder ([place])   . COPD (chronic obstructive pulmonary disease) ([place])   . Depression   . GERD (gastroesophageal reflux disease)   . GI (gastrointestinal bleed) 07/28/2015  . Heart disease   . Heart murmur   . Hyperlipidemia   . Hypertension   . Hypothyroidism   . Migraines   . OCD (obsessive compulsive disorder)   . Peripheral edema   . PTSD (post-traumatic stress disorder)   . Tubular adenoma     Past Surgical History:  Procedure Laterality Date  . ABCESS DRAINAGE     x5; neck, arm, chest, back  . BIOPSY  02/24/2015   Procedure: BIOPSY;  Surgeon: Danie Binder, MD;  [facility];  Service: Endoscopy;;  gastric bx's  . BIOPSY  12/25/2016   Procedure: BIOPSY;  Surgeon: Daneil Dolin, MD;  [facility];  Service: Endoscopy;;   gastric  . CESAREAN SECTION    . COLONOSCOPY WITH PROPOFOL N/A 06/28/2015   Dr.Rourk- inadequate prep- One 6 mm polyp at the splenic flexure bx= tubular adenoma  . ESOPHAGEAL BANDING N/A 12/25/2016   Procedure: ESOPHAGEAL BANDING with Propofol;  Surgeon: Daneil Dolin, MD;  [facility];  Service: Endoscopy;  Laterality: N/A;  possible banding of varices  . ESOPHAGOGASTRODUODENOSCOPY (EGD) WITH PROPOFOL N/A 02/24/2015   SLF: Grade II esophageal varices Moderate portal hypertensive gastropathy Mild erosive gastritis anemia likely due to many factors: gastritis, gastropathy, coagulopathy, chronic disease  . ESOPHAGOGASTRODUODENOSCOPY (EGD) WITH PROPOFOL N/A 12/25/2016   Procedure: ESOPHAGOGASTRODUODENOSCOPY (EGD) WITH PROPOFOL;  Surgeon: Daneil Dolin, MD;  [facility];  Service: Endoscopy;  Laterality: N/A;  8:45am  . KNEE ARTHROSCOPY WITH MEDIAL MENISECTOMY Left 01/09/2019   Procedure: KNEE ARTHROSCOPY WITH MEDIAL MENISCECTOMY AND LATERAL MENISCECTOMY;  Surgeon: Carole Civil, MD;  Location: [facility];  Service: Orthopedics;  Laterality: Left;  Marland Kitchen MALONEY DILATION N/A 12/25/2016   Procedure: Venia Minks DILATION;  Surgeon: Daneil Dolin, MD;  [facility];  Service: Endoscopy;  Laterality: N/A;  . POLYPECTOMY  06/28/2015   Procedure: POLYPECTOMY;  Surgeon: Daneil Dolin, MD;  [facility];  Service: Endoscopy;;  at splenic flexure  . SUPRACERVICAL ABDOMINAL HYSTERECTOMY N/A 10/02/2017   Procedure: HYSTERECTOMY SUPRACERVICAL ABDOMINAL;  Surgeon: Jonnie Kind, MD;  [facility];  Service: Gynecology;  Laterality: N/A;    There were no vitals filed for this visit.  Subjective Assessment - 04/15/19 1033    Subjective  Patient reported that she gets stiff following 15 minutes of standing. She reported that the pain in her knee can get up to an 8/10. Patient reported 60% improvement since beginning therapy.    Pertinent History  LT knee scope  01/09/19    Patient Stated Goals  Have knee back to how it was    Currently in Pain?  No/denies         [facility] PT Assessment - 04/15/19 0001      Assessment   Medical Diagnosis  Lt knee pain s/p arthroscope    Referring Provider (PT)  Arther Abbott MD    Onset Date/Surgical Date  01/09/19      AROM   Left Knee Extension  4    Left Knee Flexion  114      Static Standing Balance   Static Standing - Balance Support  No upper extremity supported    Static Standing Balance -  Activities   Single Leg Stance - Right Leg;Single Leg Stance - Left Leg    Static Standing - Comment/# of Minutes  RT: 8 seconds; LT: 7 seconds                   [facility] Adult PT Treatment/Exercise - 04/15/19 0001      Knee/Hip Exercises: Stretches   Active Hamstring Stretch  30 seconds;2 reps    Active Hamstring Stretch Limitations  standing on 12'' step      Knee/Hip Exercises: Standing   Heel Raises  Both;20 reps    Heel Raises Limitations  Limited ROM    Terminal Knee Extension  10 reps;Theraband    Theraband Level (Terminal Knee Extension)  Level 3 (Green)    Terminal Knee Extension Limitations  10''    Other Standing Knee Exercises  tandem stance 1x 30" solid surface; 1x 30" on foam      Knee/Hip Exercises: Supine   Short Arc Quad Sets  10 reps    Heel Slides  10 reps    Straight Leg Raises  10 reps;Left    Straight Leg Raises Limitations  quad set prior raise    Knee Extension  AROM    Knee Extension Limitations  4    Knee Flexion  AROM    Knee Flexion Limitations  114    Other Supine Knee/Hip Exercises  Double leg on small green physioball bending hips and knees to 90/90 and then gentle rotation from hips laterally with assistance by therapist x 15               PT Short Term Goals - 04/15/19 1034      PT SHORT TERM GOAL #1   Title  Patient will be independent with initial HEP to improve functional outcomes    Time  2    Period  Weeks    Status  Achieved    Target  Date  04/17/19      PT SHORT TERM GOAL #2   Title  Patient will report that her LT knee pain has been no greater than a 6/10 over a 1 week period indicating improved tolerance to daily  activities.    Time  2    Period  Weeks    Status  On-going    Target Date  04/17/19        PT Long Term Goals - 04/15/19 1035      PT LONG TERM GOAL #1   Title  Patient will have LT knee AROM 0-120 degrees to improve functional mobility and facilitate squatting to pick up items from floor.    Baseline  04/15/19: 4-114 degrees    Time  4    Period  Weeks    Status  On-going      PT LONG TERM GOAL #2   Title  Patient will have equal to or > 4+/5 MMT throughout LLE to improve ability to perform functional mobility, stair ambulation and ADLs.    Time  4    Period  Weeks    Status  On-going      PT LONG TERM GOAL #3   Title  Patient will report at least 75% overall improvement in subjective complaint to indicate improvement in ability to perform ADLs.    Baseline  04/15/19: 60% currently    Time  4    Period  Weeks    Status  On-going      PT LONG TERM GOAL #4   Title  Patient will report that her LT knee pain has been no greater than a 3/10 over a 1 week period indicating improved tolerance to daily activities.    Baseline  04/15/19: Max of 8/10 currently    Time  4    Period  Weeks    Status  On-going      PT LONG TERM GOAL #5   Title  Patient will demonstrate SLS of at least 15 seconds indicating improved balance and muscular stability.    Baseline  04/15/19: Lt. 7 seconds; Right 8 seconds    Time  4    Period  Weeks    Status  On-going            Plan - 04/15/19 1109    Clinical Impression Statement  Checked patient's goals this session to assess progress. Patient had made improvements in AROM of the left knee and reports overall improvement of 60% since beginning therapy. This session added supine bilateral knee and hip flexion with gentle hip rotation with therapist assist to  improve ROM and lubricate joints. Patient has made good progress in therapy and would benefit from continued skilled physical therapy in order to continue progressing towards functional goals.    Personal Factors and Comorbidities  Time since onset of injury/illness/exacerbation;Comorbidity 3+    Comorbidities  Cirrhosis of the liver, HTN, depression, clotting disorder    Examination-Activity Limitations  Bend;Locomotion Level;Squat;Transfers;Stand    Examination-Participation Restrictions  Community Activity;Other    Stability/Clinical Decision Making  Stable/Uncomplicated    Rehab Potential  Fair    PT Frequency  2x / week    PT Duration  4 weeks    PT Treatment/Interventions  ADLs/Self Care Home Management;Cryotherapy;Moist Heat;Electrical Stimulation;Gait training;Stair training;DME Instruction;Functional mobility training;Neuromuscular re-education;Balance training;Therapeutic exercise;Therapeutic activities;Patient/family education;Orthotic Fit/Training;Manual techniques;Passive range of motion;Joint Manipulations;Scar mobilization;Compression bandaging;Dry needling;Energy conservation;Taping    PT Next Visit Plan Check medicaid approval. Continue progressing functional strengthening.    PT Home Exercise Plan  04/03/19: Supine hip flexor stretch 3x30'' daily; 04/08/19: quad set, heel slide, prone quad stretch; 04/10/19: SLR       Patient will benefit from skilled therapeutic intervention in order to improve the  following deficits and impairments:  Abnormal gait, Improper body mechanics, Pain, Decreased mobility, Decreased activity tolerance, Decreased range of motion, Decreased strength, Hypomobility, Impaired flexibility, Difficulty walking, Decreased balance  Visit Diagnosis: Acute pain of left knee  Stiffness of left knee, not elsewhere classified     Problem List Patient Active Problem List   Diagnosis Date Noted  . S/P left knee arthroscopy 01/09/2019 01/16/2019  . Acute medial  meniscus tear of left knee   . Old peripheral tear of lateral meniscus of left knee   . Left anterior cruciate ligament tear   . LUQ pain 03/19/2018  . Status post abdominal supracervical subtotal hysterectomy 10/02/2017  . Anemia due to chronic blood loss 06/07/2017  . Symptomatic anemia 06/03/2017  . Abnormal uterine bleeding (AUB) 02/12/2017  . Acute blood loss anemia 11/27/2016  . Thickened endometrium 11/23/2016  . Uterine leiomyoma 11/23/2016  . Menorrhagia with regular cycle 11/23/2016  . Alcoholic cirrhosis of liver ([place]) 07/07/2016  . Esophageal dysphagia 07/07/2016  . Esophageal varices without bleeding ([place]) 07/07/2016  . IDA (iron deficiency anemia) 06/09/2016  . PTSD (post-traumatic stress disorder) 04/20/2016  . OCD (obsessive compulsive disorder) 04/20/2016  . Severe recurrent major depression without psychotic features ([place]) 04/19/2016  . GERD (gastroesophageal reflux disease) 03/09/2016  . Depression 02/07/2016  . Bilateral edema of lower extremity   . History of colonic polyps   . Alcoholic cirrhosis of liver with ascites ([place])   . Anasarca 02/16/2015  . Alcoholic hepatitis with ascites   . Ascites   . Bleeding gastrointestinal   . AKI (acute kidney injury) ([place]) 12/10/2014  . Volume overload 12/10/2014  . Alcoholic cirrhosis ([place])   . Elevated bilirubin   . Nausea with vomiting   . Diarrhea   . Absolute anemia 12/02/2014  . Jaundice 12/02/2014  . Essential hypertension 12/02/2014  . Alcohol use disorder, moderate, dependence ([place]) 12/02/2014  . Abdominal pain 12/02/2014   Clarene Critchley PT, DPT 11:10 AM, 04/15/19 [place], [place], [address] Phone: [phone]   Fax:  [phone]  Name: ELVERIA BRAKEFIELD MRN: [id number] Date of Birth: 01/12/1971

## 2019-04-18 ENCOUNTER — Telehealth (HOSPITAL_COMMUNITY): Payer: Self-pay | Admitting: Physical Therapy

## 2019-04-18 ENCOUNTER — Ambulatory Visit (HOSPITAL_COMMUNITY): Payer: Medicaid Other | Admitting: Physical Therapy

## 2019-04-18 NOTE — Telephone Encounter (Signed)
Called patient  to let her know that today's appointment would be canceled as it had not been approved yet by insurance, and reminded of next scheduled appointment.  Clarene Critchley PT, DPT 8:25 AM, 04/18/19 [phone]

## 2019-04-18 NOTE — Telephone Encounter (Signed)
call the pt to let her know that her mcaid has not ben approved for today's visit and we needed to cancel the appt. then the phone call dropped. Pt stated that she was on her way here.

## 2019-04-21 ENCOUNTER — Ambulatory Visit: Payer: Medicaid Other | Admitting: Orthopedic Surgery

## 2019-04-22 ENCOUNTER — Ambulatory Visit (HOSPITAL_COMMUNITY): Payer: Medicaid Other

## 2019-04-22 ENCOUNTER — Telehealth (HOSPITAL_COMMUNITY): Payer: Self-pay

## 2019-04-22 NOTE — Telephone Encounter (Signed)
pt called to cancel due to transportation issues

## 2019-04-24 ENCOUNTER — Ambulatory Visit (HOSPITAL_COMMUNITY): Payer: Medicaid Other | Admitting: Physical Therapy

## 2019-04-24 ENCOUNTER — Other Ambulatory Visit: Payer: Self-pay

## 2019-04-24 ENCOUNTER — Encounter (HOSPITAL_COMMUNITY): Payer: Self-pay | Admitting: Physical Therapy

## 2019-04-24 DIAGNOSIS — M25562 Pain in left knee: Secondary | ICD-10-CM | POA: Diagnosis not present

## 2019-04-24 DIAGNOSIS — M25662 Stiffness of left knee, not elsewhere classified: Secondary | ICD-10-CM

## 2019-04-24 NOTE — Therapy (Signed)
[place], [place], [address] Phone: [phone]   Fax:  [phone]  Physical Therapy Treatment  Patient Details  Name: Whitney Bush MRN: [id number] Date of Birth: [birth year] Referring Provider (PT): Arther Abbott MD   Encounter Date: 04/24/2019  PT End of Session - 04/24/19 0948    Visit Number  5    Number of Visits  9    Date for PT Re-Evaluation  05/01/19    Authorization Type  Medicaid (3 visits approved 2/8-->04/20/19); 6 visits approved 04/21/19-05/11/19    Authorization Time Period  04/03/19 - 05/01/19    Authorization - Visit Number  1    Authorization - Number of Visits  6    PT Start Time  0905    PT Stop Time  0935   Shortened session to patient's tolerance   PT Time Calculation (min)  30 min    Activity Tolerance  Patient limited by pain    Behavior During Therapy  [facility] for tasks assessed/performed       Past Medical History:  Diagnosis Date  . Alcohol abuse   . 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 Anemia   . Blood transfusion without reported diagnosis   . Boil 06/04/2015  . Chronic kidney disease   . Clotting disorder ([place])   . COPD (chronic obstructive pulmonary disease) ([place])   . Depression   . GERD (gastroesophageal reflux disease)   . GI (gastrointestinal bleed) 07/28/2015  . Heart disease   . Heart murmur   . Hyperlipidemia   . Hypertension   . Hypothyroidism   . Migraines   . OCD (obsessive compulsive disorder)   . Peripheral edema   . PTSD (post-traumatic stress disorder)   . Tubular adenoma     Past Surgical History:  Procedure Laterality Date  . ABCESS DRAINAGE     x5; neck, arm, chest, back  . BIOPSY  02/24/2015   Procedure: BIOPSY;  Surgeon: Danie Binder, MD;  [facility];  Service: Endoscopy;;  gastric bx's  . BIOPSY  12/25/2016   Procedure: BIOPSY;  Surgeon: Daneil Dolin, MD;  [facility];  Service:  Endoscopy;;  gastric  . CESAREAN SECTION    . COLONOSCOPY WITH PROPOFOL N/A 06/28/2015   Dr.Rourk- inadequate prep- One 6 mm polyp at the splenic flexure bx= tubular adenoma  . ESOPHAGEAL BANDING N/A 12/25/2016   Procedure: ESOPHAGEAL BANDING with Propofol;  Surgeon: Daneil Dolin, MD;  [facility];  Service: Endoscopy;  Laterality: N/A;  possible banding of varices  . ESOPHAGOGASTRODUODENOSCOPY (EGD) WITH PROPOFOL N/A 02/24/2015   SLF: Grade II esophageal varices Moderate portal hypertensive gastropathy Mild erosive gastritis anemia likely due to many factors: gastritis, gastropathy, coagulopathy, chronic disease  . ESOPHAGOGASTRODUODENOSCOPY (EGD) WITH PROPOFOL N/A 12/25/2016   Procedure: ESOPHAGOGASTRODUODENOSCOPY (EGD) WITH PROPOFOL;  Surgeon: Daneil Dolin, MD;  [facility];  Service: Endoscopy;  Laterality: N/A;  8:45am  . KNEE ARTHROSCOPY WITH MEDIAL MENISECTOMY Left 01/09/2019   Procedure: KNEE ARTHROSCOPY WITH MEDIAL MENISCECTOMY AND LATERAL MENISCECTOMY;  Surgeon: Carole Civil, MD;  [facility];  Service: Orthopedics;  Laterality: Left;  Marland Kitchen MALONEY DILATION N/A 12/25/2016   Procedure: Venia Minks DILATION;  Surgeon: Daneil Dolin, MD;  [facility];  Service: Endoscopy;  Laterality: N/A;  . POLYPECTOMY  06/28/2015   Procedure: POLYPECTOMY;  Surgeon: Cristopher Estimable  Rourk, MD;  [facility];  Service: Endoscopy;;  at splenic flexure  . SUPRACERVICAL ABDOMINAL HYSTERECTOMY N/A 10/02/2017   Procedure: HYSTERECTOMY SUPRACERVICAL ABDOMINAL;  Surgeon: Jonnie Kind, MD;  [facility]: [facility];  Service: Gynecology;  Laterality: N/A;    There were no vitals filed for this visit.  Subjective Assessment - 04/24/19 0926    Subjective  Patient reported that she had a fall when she was walking and slipped in a muddy area on 04/22/19. She reported that she has not been able to move her knee as good since this and that she has swelling. Reported having to  use her crutches initially, but that she doesn't have to use them now.    Pertinent History  LT knee scope 01/09/19    Patient Stated Goals  Have knee back to how it was    Currently in Pain?  Yes    Pain Score  10-Worst pain ever    Pain Location  Knee    Pain Orientation  Left    Pain Descriptors / Indicators  Sharp    Pain Type  Acute pain         [facility] PT Assessment - 04/24/19 0001      Assessment   Medical Diagnosis  Lt knee pain s/p arthroscope    Referring Provider (PT)  Arther Abbott MD    Onset Date/Surgical Date  01/09/19      Observation/Other Assessments   Observations  Noted edema in knee no redness noted or excessive warmth felt.      AROM   Left Knee Extension  22    Left Knee Flexion  74      Palpation   Palpation comment  Tender to palpation through quadriceps and medial LT knee near joint line      Special Tests    Special Tests  Knee Special Tests    Knee Special tests   other      other    Findings  Negative    Side   Left    Comments  Anterior drawer negative, although limited by ROM                   [facility] Adult PT Treatment/Exercise - 04/24/19 0001      Knee/Hip Exercises: Supine   Quad Sets  10 reps    Quad Sets Limitations  3'' holds    Heel Slides  10 reps    Knee Extension  AROM    Knee Extension Limitations  22   was 4   Knee Flexion  AROM    Knee Flexion Limitations  74   was 114     Manual Therapy   Manual Therapy  Edema management    Manual therapy comments  all manual completed separately from other skilled intervention    Edema Management  Retrograde massage with bilateral LEs elevated to LLE for edema reduction with education to patient on how to perform at home             PT Education - 04/24/19 0947    Education Details  Discussed examination findings, educated on edema massage, elevation, ice, and ankle pumps for edema control. Educated on importance of following up with MD.    Terence Lux) Educated   Patient    Methods  Explanation;Demonstration    Comprehension  Verbalized understanding       PT Short Term Goals - 04/15/19 1034      PT SHORT  TERM GOAL #1   Title  Patient will be independent with initial HEP to improve functional outcomes    Time  2    Period  Weeks    Status  Achieved    Target Date  04/17/19      PT SHORT TERM GOAL #2   Title  Patient will report that her LT knee pain has been no greater than a 6/10 over a 1 week period indicating improved tolerance to daily activities.    Time  2    Period  Weeks    Status  On-going    Target Date  04/17/19        PT Long Term Goals - 04/15/19 1035      PT LONG TERM GOAL #1   Title  Patient will have LT knee AROM 0-120 degrees to improve functional mobility and facilitate squatting to pick up items from floor.    Baseline  04/15/19: 4-114 degrees    Time  4    Period  Weeks    Status  On-going      PT LONG TERM GOAL #2   Title  Patient will have equal to or > 4+/5 MMT throughout LLE to improve ability to perform functional mobility, stair ambulation and ADLs.    Time  4    Period  Weeks    Status  On-going      PT LONG TERM GOAL #3   Title  Patient will report at least 75% overall improvement in subjective complaint to indicate improvement in ability to perform ADLs.    Baseline  04/15/19: 60% currently    Time  4    Period  Weeks    Status  On-going      PT LONG TERM GOAL #4   Title  Patient will report that her LT knee pain has been no greater than a 3/10 over a 1 week period indicating improved tolerance to daily activities.    Baseline  04/15/19: Max of 8/10 currently    Time  4    Period  Weeks    Status  On-going      PT LONG TERM GOAL #5   Title  Patient will demonstrate SLS of at least 15 seconds indicating improved balance and muscular stability.    Baseline  04/15/19: Lt. 7 seconds; Right 8 seconds    Time  4    Period  Weeks    Status  On-going            Plan - 04/24/19 1112     Clinical Impression Statement  Patient reporting an incidence of a fall which occurred a couple days ago. Performed an assessment of patient's current knee motion, edema, and ligament integrity. Noted increased edema and decreased knee motion. With ligament testing, noted good ligament integrity, although the test was limited some by patient's decreased ROM. Focused on gentle AROM and edema reduction this session. Emphasized importance of working on edema reduction strategies at home and educated patient how to perform retrograde massage. Discussed the importance of contacting her MD to determine plan of action. Also informed to perform gentle ROM exercises. Plan to follow-up regarding patient contacting her MD.    Personal Factors and Comorbidities  Time since onset of injury/illness/exacerbation;Comorbidity 3+    Comorbidities  Cirrhosis of the liver, HTN, depression, clotting disorder    Examination-Activity Limitations  Bend;Locomotion Level;Squat;Transfers;Stand    Examination-Participation Restrictions  Community Activity;Other    Stability/Clinical Decision Making  Stable/Uncomplicated  Rehab Potential  Fair    PT Frequency  2x / week    PT Duration  4 weeks    PT Treatment/Interventions  ADLs/Self Care Home Management;Cryotherapy;Moist Heat;Electrical Stimulation;Gait training;Stair training;DME Instruction;Functional mobility training;Neuromuscular re-education;Balance training;Therapeutic exercise;Therapeutic activities;Patient/family education;Orthotic Fit/Training;Manual techniques;Passive range of motion;Joint Manipulations;Scar mobilization;Compression bandaging;Dry needling;Energy conservation;Taping    PT Next Visit Plan  Follow-up regarding patient contact MD.    PT Home Exercise Plan  04/03/19: Supine hip flexor stretch 3x30'' daily; 04/08/19: quad set, heel slide, prone quad stretch; 04/10/19: SLR    Consulted and Agree with Plan of Care  Patient       Patient will benefit from  skilled therapeutic intervention in order to improve the following deficits and impairments:  Abnormal gait, Improper body mechanics, Pain, Decreased mobility, Decreased activity tolerance, Decreased range of motion, Decreased strength, Hypomobility, Impaired flexibility, Difficulty walking, Decreased balance  Visit Diagnosis: Acute pain of left knee  Stiffness of left knee, not elsewhere classified     Problem List Patient Active Problem List   Diagnosis Date Noted  . S/P left knee arthroscopy 01/09/2019 01/16/2019  . Acute medial meniscus tear of left knee   . Old peripheral tear of lateral meniscus of left knee   . Left anterior cruciate ligament tear   . LUQ pain 03/19/2018  . Status post abdominal supracervical subtotal hysterectomy 10/02/2017  . Anemia due to chronic blood loss 06/07/2017  . Symptomatic anemia 06/03/2017  . Abnormal uterine bleeding (AUB) 02/12/2017  . Acute blood loss anemia 11/27/2016  . Thickened endometrium 11/23/2016  . Uterine leiomyoma 11/23/2016  . Menorrhagia with regular cycle 11/23/2016  . Alcoholic cirrhosis of liver ([place]) 07/07/2016  . Esophageal dysphagia 07/07/2016  . Esophageal varices without bleeding ([place]) 07/07/2016  . IDA (iron deficiency anemia) 06/09/2016  . PTSD (post-traumatic stress disorder) 04/20/2016  . OCD (obsessive compulsive disorder) 04/20/2016  . Severe recurrent major depression without psychotic features ([place]) 04/19/2016  . GERD (gastroesophageal reflux disease) 03/09/2016  . Depression 02/07/2016  . Bilateral edema of lower extremity   . History of colonic polyps   . Alcoholic cirrhosis of liver with ascites ([place])   . Anasarca 02/16/2015  . Alcoholic hepatitis with ascites   . Ascites   . Bleeding gastrointestinal   . AKI (acute kidney injury) ([place]) 12/10/2014  . Volume overload 12/10/2014  . Alcoholic cirrhosis ([place])   . Elevated bilirubin   . Nausea with vomiting   . Diarrhea   . Absolute anemia 12/02/2014   . Jaundice 12/02/2014  . Essential hypertension 12/02/2014  . Alcohol use disorder, moderate, dependence ([place]) 12/02/2014  . Abdominal pain 12/02/2014   Clarene Critchley PT, DPT 11:14 AM, 04/24/19 [place], [place], [address] Phone: [phone]   Fax:  [phone]  Name: ELMIRA SCHRANZ MRN: [id number] Date of Birth: 1970-12-29

## 2019-04-29 ENCOUNTER — Telehealth (HOSPITAL_COMMUNITY): Payer: Self-pay | Admitting: Physical Therapy

## 2019-04-29 ENCOUNTER — Ambulatory Visit (HOSPITAL_COMMUNITY): Payer: Medicaid Other | Admitting: Physical Therapy

## 2019-04-29 NOTE — Telephone Encounter (Signed)
HER KNEE IS [place] AND SHE WILL NOT BE HERE [place] HER NEXT VISIT

## 2019-05-01 ENCOUNTER — Ambulatory Visit (HOSPITAL_COMMUNITY): Payer: Medicaid Other | Attending: Orthopedic Surgery | Admitting: Physical Therapy

## 2019-05-01 ENCOUNTER — Encounter (HOSPITAL_COMMUNITY): Payer: Self-pay | Admitting: Physical Therapy

## 2019-05-01 ENCOUNTER — Telehealth: Payer: Self-pay | Admitting: Orthopedic Surgery

## 2019-05-01 ENCOUNTER — Other Ambulatory Visit: Payer: Self-pay

## 2019-05-01 DIAGNOSIS — M25562 Pain in left knee: Secondary | ICD-10-CM | POA: Diagnosis not present

## 2019-05-01 DIAGNOSIS — M25662 Stiffness of left knee, not elsewhere classified: Secondary | ICD-10-CM | POA: Insufficient documentation

## 2019-05-01 NOTE — Therapy (Addendum)
[facility] [address] [place], [place], [address] Phone: [phone]   Fax:  [phone]  Physical Therapy Treatment / Progress note  Patient Details  Name: Whitney Bush MRN: [id number] Date of Birth: 15-Dec-1970 Referring Provider (PT): Arther Abbott MD   Encounter Date: 05/01/2019   Progress Note Reporting Period 04/03/19 to 05/01/19  See note below for Objective Data and Assessment of Progress/Goals.       PT End of Session - 05/01/19 0933    Visit Number  6    Number of Visits  9    Date for PT Re-Evaluation  05/29/19   Insurance up on 05/11/19   Authorization Type  Medicaid (3 visits approved 2/8-->04/20/19); 6 visits approved 04/21/19-05/11/19    Authorization Time Period  04/03/19 - 05/01/19; 05/01/19-05/29/19    Authorization - Visit Number  2    Authorization - Number of Visits  6    PT Start Time  0900    PT Stop Time  0925   Shortened session for tolerance and to perform only conservative exercises/treatment   PT Time Calculation (min)  25 min    Activity Tolerance  Patient limited by pain    Behavior During Therapy  [facility] for tasks assessed/performed       Past Medical History:  Diagnosis Date  . Alcohol abuse   . 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 Anemia   . Blood transfusion without reported diagnosis   . Boil 06/04/2015  . Chronic kidney disease   . Clotting disorder ([place])   . COPD (chronic obstructive pulmonary disease) ([place])   . Depression   . GERD (gastroesophageal reflux disease)   . GI (gastrointestinal bleed) 07/28/2015  . Heart disease   . Heart murmur   . Hyperlipidemia   . Hypertension   . Hypothyroidism   . Migraines   . OCD (obsessive compulsive disorder)   . Peripheral edema   . PTSD (post-traumatic stress disorder)   . Tubular adenoma     Past Surgical History:  Procedure Laterality Date  . ABCESS DRAINAGE     x5; neck, arm, chest, back  . BIOPSY   02/24/2015   Procedure: BIOPSY;  Surgeon: Danie Binder, MD;  [facility];  Service: Endoscopy;;  gastric bx's  . BIOPSY  12/25/2016   Procedure: BIOPSY;  Surgeon: Daneil Dolin, MD;  [facility];  Service: Endoscopy;;  gastric  . CESAREAN SECTION    . COLONOSCOPY WITH PROPOFOL N/A 06/28/2015   Dr.Rourk- inadequate prep- One 6 mm polyp at the splenic flexure bx= tubular adenoma  . ESOPHAGEAL BANDING N/A 12/25/2016   Procedure: ESOPHAGEAL BANDING with Propofol;  Surgeon: Daneil Dolin, MD;  [facility];  Service: Endoscopy;  Laterality: N/A;  possible banding of varices  . ESOPHAGOGASTRODUODENOSCOPY (EGD) WITH PROPOFOL N/A 02/24/2015   SLF: Grade II esophageal varices Moderate portal hypertensive gastropathy Mild erosive gastritis anemia likely due to many factors: gastritis, gastropathy, coagulopathy, chronic disease  . ESOPHAGOGASTRODUODENOSCOPY (EGD) WITH PROPOFOL N/A 12/25/2016   Procedure: ESOPHAGOGASTRODUODENOSCOPY (EGD) WITH PROPOFOL;  Surgeon: Daneil Dolin, MD;  [facility];  Service: Endoscopy;  Laterality: N/A;  8:45am  . KNEE ARTHROSCOPY WITH MEDIAL MENISECTOMY Left 01/09/2019   Procedure: KNEE ARTHROSCOPY WITH MEDIAL MENISCECTOMY AND LATERAL MENISCECTOMY;  Surgeon: Carole Civil, MD;  Location: [facility];  Service: Orthopedics;  Laterality: Left;  .  MALONEY DILATION N/A 12/25/2016   Procedure: Venia Minks DILATION;  Surgeon: Daneil Dolin, MD;  [facility];  Service: Endoscopy;  Laterality: N/A;  . POLYPECTOMY  06/28/2015   Procedure: POLYPECTOMY;  Surgeon: Daneil Dolin, MD;  [facility];  Service: Endoscopy;;  at splenic flexure  . SUPRACERVICAL ABDOMINAL HYSTERECTOMY N/A 10/02/2017   Procedure: HYSTERECTOMY SUPRACERVICAL ABDOMINAL;  Surgeon: Jonnie Kind, MD;  Location: [facility];  Service: Gynecology;  Laterality: N/A;    There were no vitals filed for this visit.  Subjective Assessment - 05/01/19  0907    Subjective  Patient reported that she has not been able to contact her MD yet. She said her knee is still swollen and that she will call her MD.    Pertinent History  LT knee scope 01/09/19    Patient Stated Goals  Have knee back to how it was    Currently in Pain?  Yes    Pain Score  9     Pain Location  Knee    Pain Orientation  Left    Pain Descriptors / Indicators  Patsi Sears PT Assessment - 05/01/19 0001      Assessment   Medical Diagnosis  Lt knee pain s/p arthroscope    Referring Provider (PT)  Arther Abbott MD    Onset Date/Surgical Date  01/09/19      Balance Screen   Has the patient fallen in the past 6 months  Yes    How many times?  1      Observation/Other Assessments   Observations  Noted edema in knee no redness noted or excessive warmth felt.      AROM   Left Knee Extension  12    Left Knee Flexion  70      Palpation   Palpation comment  Tender to palpation of lateral knee      Special Tests    Special Tests  Knee Special Tests    Knee Special tests   other      other    Findings  Negative    Side   Left    Comments  Anterior and posterior drawer negative, although limited by ROM.                    [employer] Adult PT Treatment/Exercise - 05/01/19 0001      Knee/Hip Exercises: Supine   Knee Extension Limitations  12   was 22   Knee Flexion  AROM    Knee Flexion Limitations  70   was 74     Manual Therapy   Manual Therapy  Edema management    Manual therapy comments  all manual completed separately from other skilled intervention    Edema Management  Retrograde massage with bilateral LEs elevated to LLE for edema reduction with education to patient on how to perform at home     Heel slides x 10 Quad sets x 10        PT Education - 05/01/19 0932    Education Details  Reminded patient to contact MD and to continue edema reduction strategies and gentle ROM exercises.    Person(s) Educated  Patient    Methods   Explanation    Comprehension  Verbalized understanding       PT Short Term Goals - 05/01/19 0936      PT SHORT TERM GOAL #1   Title  Patient will be independent with initial HEP to improve functional outcomes    Time  2    Period  Weeks    Status  Achieved    Target Date  04/17/19      PT SHORT TERM GOAL #2   Title  Patient will report that her LT knee pain has been no greater than a 6/10 over a 1 week period indicating improved tolerance to daily activities.    Baseline  05/01/19: Reporting 9/10 pain    Time  2    Period  Weeks    Status  On-going    Target Date  04/17/19        PT Long Term Goals - 05/01/19 0937      PT LONG TERM GOAL #1   Title  Patient will have LT knee AROM 0-120 degrees to improve functional mobility and facilitate squatting to pick up items from floor.    Baseline  05/01/19: 12-70 degrees on the LT    Time  4    Period  Weeks    Status  On-going      PT LONG TERM GOAL #2   Title  Patient will have equal to or > 4+/5 MMT throughout LLE to improve ability to perform functional mobility, stair ambulation and ADLs.    Time  4    Period  Weeks    Status  On-going      PT LONG TERM GOAL #3   Title  Patient will report at least 75% overall improvement in subjective complaint to indicate improvement in ability to perform ADLs.    Baseline  05/01/19: Decreased since the fall    Time  4    Period  Weeks    Status  On-going      PT LONG TERM GOAL #4   Title  Patient will report that her LT knee pain has been no greater than a 3/10 over a 1 week period indicating improved tolerance to daily activities.    Baseline  05/01/19: Patient with 9/10 pain currently    Time  4    Period  Weeks    Status  On-going      PT LONG TERM GOAL #5   Title  Patient will demonstrate SLS of at least 15 seconds indicating improved balance and muscular stability.    Baseline  04/15/19: Lt. 7 seconds; Right 8 seconds    Time  4    Period  Weeks    Status  On-going             Plan - 05/01/19 0944    Clinical Impression Statement  Patient continues to demonstrate decreased ROM and increased edema this session. Ligament special tests for PCL and ACL were negative. Patient reported she has not yet contacted MD and reminded patient to do this. Checked patient's goals this session, with noted regression in goals since her fall. Performed only conservative treatment this session focused on decreasing edema and improving ROM until patient is able to speak with her MD. Plan to continue therapy for an additional 4 weeks, with MD's approval.    Personal Factors and Comorbidities  Time since onset of injury/illness/exacerbation;Comorbidity 3+    Comorbidities  Cirrhosis of the liver, HTN, depression, clotting disorder    Examination-Activity Limitations  Bend;Locomotion Level;Squat;Transfers;Stand    Examination-Participation Restrictions  Community Activity;Other    Stability/Clinical Decision Making  Stable/Uncomplicated    Rehab Potential  Fair    PT Frequency  2x /  week    PT Duration  4 weeks    PT Treatment/Interventions  ADLs/Self Care Home Management;Cryotherapy;Moist Heat;Electrical Stimulation;Gait training;Stair training;DME Instruction;Functional mobility training;Neuromuscular re-education;Balance training;Therapeutic exercise;Therapeutic activities;Patient/family education;Orthotic Fit/Training;Manual techniques;Passive range of motion;Joint Manipulations;Scar mobilization;Compression bandaging;Dry needling;Energy conservation;Taping    PT Next Visit Plan  Follow-up regarding patient contact MD.    PT Home Exercise Plan  04/03/19: Supine hip flexor stretch 3x30'' daily; 04/08/19: quad set, heel slide, prone quad stretch; 04/10/19: SLR    Consulted and Agree with Plan of Care  Patient       Patient will benefit from skilled therapeutic intervention in order to improve the following deficits and impairments:  Abnormal gait, Improper body mechanics, Pain,  Decreased mobility, Decreased activity tolerance, Decreased range of motion, Decreased strength, Hypomobility, Impaired flexibility, Difficulty walking, Decreased balance  Visit Diagnosis: Acute pain of left knee  Stiffness of left knee, not elsewhere classified     Problem List Patient Active Problem List   Diagnosis Date Noted  . S/P left knee arthroscopy 01/09/2019 01/16/2019  . Acute medial meniscus tear of left knee   . Old peripheral tear of lateral meniscus of left knee   . Left anterior cruciate ligament tear   . LUQ pain 03/19/2018  . Status post abdominal supracervical subtotal hysterectomy 10/02/2017  . Anemia due to chronic blood loss 06/07/2017  . Symptomatic anemia 06/03/2017  . Abnormal uterine bleeding (AUB) 02/12/2017  . Acute blood loss anemia 11/27/2016  . Thickened endometrium 11/23/2016  . Uterine leiomyoma 11/23/2016  . Menorrhagia with regular cycle 11/23/2016  . Alcoholic cirrhosis of liver ([place]) 07/07/2016  . Esophageal dysphagia 07/07/2016  . Esophageal varices without bleeding ([place]) 07/07/2016  . IDA (iron deficiency anemia) 06/09/2016  . PTSD (post-traumatic stress disorder) 04/20/2016  . OCD (obsessive compulsive disorder) 04/20/2016  . Severe recurrent major depression without psychotic features ([place]) 04/19/2016  . GERD (gastroesophageal reflux disease) 03/09/2016  . Depression 02/07/2016  . Bilateral edema of lower extremity   . History of colonic polyps   . Alcoholic cirrhosis of liver with ascites ([place])   . Anasarca 02/16/2015  . Alcoholic hepatitis with ascites   . Ascites   . Bleeding gastrointestinal   . AKI (acute kidney injury) ([place]) 12/10/2014  . Volume overload 12/10/2014  . Alcoholic cirrhosis ([place])   . Elevated bilirubin   . Nausea with vomiting   . Diarrhea   . Absolute anemia 12/02/2014  . Jaundice 12/02/2014  . Essential hypertension 12/02/2014  . Alcohol use disorder, moderate, dependence ([place]) 12/02/2014  . Abdominal  pain 12/02/2014   Clarene Critchley PT, DPT 9:47 AM, 05/01/19 [place], [place], [address] Phone: [phone]   Fax:  [phone]  Name: Whitney Bush MRN: [id number] Date of Birth: 1970/07/15

## 2019-05-01 NOTE — Telephone Encounter (Signed)
Whitney Bush called and left message while we were at lunch.  I called her back.  Apparently she has had a fall and re-injured her knee.  She said she didn't know what to do.  I told her that she should go ahead and get it checked it by her PCP, ER or an Urgent Care.  She said she would do this and let us know if she needed to schedule an appointment.

## 2019-05-05 ENCOUNTER — Telehealth (HOSPITAL_COMMUNITY): Payer: Self-pay | Admitting: Physical Therapy

## 2019-05-05 NOTE — Telephone Encounter (Signed)
pt has conflicting appt and cancelled appt for 3/9

## 2019-05-06 ENCOUNTER — Encounter (HOSPITAL_COMMUNITY): Payer: Self-pay | Admitting: Physical Therapy

## 2019-05-08 ENCOUNTER — Telehealth (HOSPITAL_COMMUNITY): Payer: Self-pay | Admitting: Physical Therapy

## 2019-05-08 ENCOUNTER — Ambulatory Visit (HOSPITAL_COMMUNITY): Payer: Medicaid Other | Admitting: Physical Therapy

## 2019-05-08 NOTE — Telephone Encounter (Signed)
Called regarding patient not showing for appointment today, however there was no answer and was unable to leave a message. Planned to discuss if patient had gone to get her knee examined, and to discuss if patient should be put on hold at this time. In addition, this was last visit within approved insurance visits. Will wait to hear back from the patient.  Clarene Critchley PT, DPT 10:22 AM, 05/08/19 [phone]

## 2019-05-12 ENCOUNTER — Encounter: Payer: Self-pay | Admitting: Internal Medicine

## 2019-05-13 ENCOUNTER — Telehealth: Payer: Self-pay | Admitting: Orthopedic Surgery

## 2019-05-13 NOTE — Telephone Encounter (Signed)
Patient called today and said she fell last week and wanted an appointment.  She said she hut her knee and her back.  I told her that Dr. Aline Brochure did not have any emergent appointments today.  I told her to call her PCP for an appointment or see what they suggest that she needs to do.  She said she would do this.

## 2019-06-02 ENCOUNTER — Ambulatory Visit: Payer: Medicaid Other | Admitting: Gastroenterology

## 2019-06-04 ENCOUNTER — Telehealth: Payer: Self-pay | Admitting: *Deleted

## 2019-06-04 DIAGNOSIS — R1011 Right upper quadrant pain: Secondary | ICD-10-CM

## 2019-06-04 NOTE — Telephone Encounter (Signed)
Pt called in crying stating that she was under a lot of stress due to her daughter and boyfriend right now.  She said that she feels like she is on the verge of drinking again.  She is requesting detox pills to help her not want to drink.  Discussed with LSL and she recommended that pt proceed to ER so that they can help her overall well being.  Tried to call pt back but she did not answer.  LVMOM for pt to call me back.

## 2019-06-04 NOTE — Telephone Encounter (Signed)
Patient called back. She states she wants a referral to have her gallstones removed. C/o abd pain "left-right". She stated she just wants them taken out. Please advise Whitney Bush thanks

## 2019-06-06 NOTE — Addendum Note (Signed)
Addended by: Cheron Every on: 06/06/2019 07:32 AM   Modules accepted: Orders

## 2019-06-06 NOTE — Telephone Encounter (Signed)
Referral sent to [facility] surgical

## 2019-06-06 NOTE — Telephone Encounter (Signed)
[place] for referral for evaluation for gb surgery.  Angie, can we please call patient back to given her the information regarding the first message.

## 2019-06-06 NOTE — Telephone Encounter (Addendum)
Called pt and she is aware that we sent a referral for evaluation for gb surgery.  Pt made aware of LSL's recommendations about going to the ER if needed RE: detox.  Pt voiced understanding and is requesting that I call her mom to inform her of all of her medical necessities.

## 2019-06-09 ENCOUNTER — Ambulatory Visit (INDEPENDENT_AMBULATORY_CARE_PROVIDER_SITE_OTHER): Payer: Medicaid Other | Admitting: Gastroenterology

## 2019-06-09 ENCOUNTER — Encounter: Payer: Self-pay | Admitting: Gastroenterology

## 2019-06-09 ENCOUNTER — Other Ambulatory Visit: Payer: Self-pay

## 2019-06-09 VITALS — BP 130/86 | HR 78 | Temp 97.1°F | Ht 63.0 in | Wt 246.0 lb

## 2019-06-09 DIAGNOSIS — K8070 Calculus of gallbladder and bile duct without cholecystitis without obstruction: Secondary | ICD-10-CM

## 2019-06-09 DIAGNOSIS — K7030 Alcoholic cirrhosis of liver without ascites: Secondary | ICD-10-CM | POA: Diagnosis not present

## 2019-06-09 DIAGNOSIS — K8020 Calculus of gallbladder without cholecystitis without obstruction: Secondary | ICD-10-CM | POA: Insufficient documentation

## 2019-06-09 NOTE — Assessment & Plan Note (Signed)
Patient requesting referral for consideration of cholecystectomy.  She has had some issues with postprandial right upper quadrant pain in the past however more recently it is unclear that her gallstones were contributing to her symptoms.  Encouraged her to follow-up with Dr. Arnoldo Morale for his recommendations tomorrow.

## 2019-06-09 NOTE — Assessment & Plan Note (Signed)
Patient continues to have relapses with her alcohol use.  She declines behavioral medicine consultation, alcohol rehab.  Continues to have a lot of stress.  We discussed the need to pursue any type of avenues if she is ever interested in pursuing liver transplant in the future.  She is due for right upper quadrant ultrasound to screen for [place].  We will wait she sees Dr. Arnoldo Morale tomorrow to make recommendations.  Most recent labs 2 months ago with bump in AST and ALT which may or may not of been related to active alcohol use at the time.  We will plan update labs in the next month or 2.  It is unclear what dose of propanolol she is currently taking.  We have contacted the pharmacy to clarify.  We will plan to see her back in 6 months.

## 2019-06-09 NOTE — Patient Instructions (Signed)
1. If you decide you would like for [place] to set you up with behavioral health counselor, please let us know. 2. We will be in touch with any needed changes on your propranolol once I receive information from the pharmacy.  3. Continue to abstain from alcohol use. It is not safe for you to drink any amount in the setting of cirrhosis.  4. We will hold off on ultrasound of your liver until you have seen Dr. Arnoldo Morale about your gallbladder.  5. Return to the office in six months or call sooner if needed.

## 2019-06-09 NOTE — Telephone Encounter (Signed)
Spoke to pt's mom (per pt request).  She is aware that pt is being referred for gb surgery evaluation.  Mom voiced understanding.

## 2019-06-09 NOTE — Telephone Encounter (Signed)
Tried to call pt's mother (per pt request).  She is listed on DPR.  No answer and unable to leave vm.

## 2019-06-09 NOTE — Progress Notes (Signed)
CC'ED TO PCP 

## 2019-06-09 NOTE — Progress Notes (Signed)
Primary Care Physician: Jerel Shepherd, [place]  Primary Gastroenterologist:  Garfield Cornea, MD   Chief Complaint  Patient presents with  . Cirrhosis    f/u  . Nausea    sometimes but no vomiting  . left side pain    comes/goes    HPI: Whitney Bush is a 49 y.o. female here for follow-up.  Last seen December 2020. 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  Also with history of IDA in the setting of heavy menses.  Underwent a hysterectomy in August 2019. EGD October 2018 showed 2 columns of grade 2 esophageal varices, 1 column of grade 3.  Mild portal hypertensive gastropathy, biopsy showing reactive gastropathy but no H. pylori.  Esophagus dilated to 1 [place].  Colonoscopy May 2017, inadequate bowel prep.  One 6 mm polyp in the splenic flexure removed, tubular adenoma.  Recommended 5-year follow-up colonoscopy.  She is due ruq u/s at this time. Last labs in 03/2019 with bump in AST/ALT 42/55.  Patient has been under a lot of stress.  She called last week stating that she was trying her best not to go back to drinking.  At that time she did not report actively drinking.  She reported a lot of stress related to her daughter and her boyfriend.  We provided her instructions to seek help at the emergency department to get urgent behavioral health consult and help to avoid drinking.  Previously had been provided national help hotline.  Patient then called back later the day requesting referral to general surgeon to get her gallbladder removed.  Today patient states she is doing okay.  Last week she was under a lot of stress. Her cousin also died suddenly which added to things.  This led her to drink and when she called she was actively drinking but did not let us know that.  She used Librium that have been provided to her before.  States her aunt had been staying with her to make sure she did okay as she came off of alcohol.  Stressed the importance to the patient today to let us know  all the facts when she calls.  She needs [place] so that we can provide the appropriate medical attention.  Today she feels much better.  She declines assistance with alcohol rehab and behavioral medicine referral.  Explained to her that she could benefit from seeing a counselor to help her cope with stress and all the things that tend to bring back the urges of alcohol consumption.  She declined.  Bowel movements are regular.  She uses lactulose as needed.  Denies any blood in the stool or melena.  She has upper abdominal pain both right and left upper quadrants.  Seems to be worse with movement.  Possibly related to meals.  She has had some nausea without vomiting.  She tries to minimize food intake to maintain her weight/strive for weight loss.  Has an appointment to see Dr. Arnoldo Morale tomorrow.  Patient only shows 2 different doses for propanolol.  Patient states she is taking both.  We have contacted pharmacy for clarification.  Current Outpatient Medications  Medication Sig Dispense Refill  . albuterol (PROVENTIL HFA;VENTOLIN HFA) 108 (90 Base) MCG/ACT inhaler Inhale 1-2 puffs into the lungs every 6 (six) hours as needed for wheezing or shortness of breath. 1 Inhaler 0  . B Complex-C (SUPER B COMPLEX PO) Take 1 capsule by mouth daily.     Marland Kitchen doxycycline (VIBRAMYCIN) 100  MG capsule Take 100 mg by mouth daily. For acne    . FLUoxetine (PROZAC) 20 MG capsule Take 20 mg by mouth daily.    . furosemide (LASIX) 40 MG tablet Take 1 tablet (40 mg total) by mouth daily. 30 tablet 5  . gabapentin (NEURONTIN) 100 MG capsule Take 1 capsule (100 mg total) by mouth 3 (three) times daily. 90 capsule 2  . lactulose (CONSTULOSE) 10 GM/15ML solution TAKE 30 MLS BY MOUTH TWICE DAILY AS NEEDED FOR MILD CONSTIPATION OR MODERATE CONSTIPATION. 1892 mL 5  . propranolol (INDERAL) 10 MG tablet Take 1 tablet (10 mg total) by mouth 2 (two) times daily. 90 tablet 3  . propranolol (INDERAL) 20 MG tablet Take 1 tablet (20 mg  total) by mouth 2 (two) times daily. 60 tablet 5  . spironolactone (ALDACTONE) 50 MG tablet Take 1 tablet (50 mg total) by mouth daily. 30 tablet 5  . traZODone (DESYREL) 100 MG tablet Take 100 mg by mouth as needed.      No current facility-administered medications for this visit.    Allergies as of 06/09/2019  . (No Known Allergies)    ROS:  General: Negative for anorexia, weight loss, fever, chills, fatigue, weakness. ENT: Negative for hoarseness, difficulty swallowing , nasal congestion. CV: Negative for chest pain, angina, palpitations, dyspnea on exertion, peripheral edema.  Respiratory: Negative for dyspnea at rest, dyspnea on exertion, cough, sputum, wheezing.  GI: See history of present illness. GU:  Negative for dysuria, hematuria, urinary incontinence, urinary frequency, nocturnal urination.  Endo: Negative for unusual weight change.    Physical Examination:   BP 130/86   Pulse 78   Temp (!) 97.1 F (36.2 C) (Oral)   Ht 5\' 3"  (1.6 m)   Wt 246 lb (111.6 kg)   LMP 09/12/2017 (Exact Date) Comment:    BMI 43.58 kg/m   General: Well-nourished, well-developed in no acute distress.  Eyes: No icterus. Mouth: masked Abdomen: Bowel sounds are normal, nontender, nondistended, no hepatosplenomegaly or masses, no abdominal bruits or hernia , no rebound or guarding.   Extremities: No lower extremity edema. No clubbing or deformities. Neuro: Alert and oriented x 4   Skin: Warm and dry, no jaundice.   Psych: Alert and cooperative, normal mood and affect.  Labs:  Lab Results  Component Value Date   CREATININE 0.90 04/03/2019   BUN 16 04/03/2019   NA 138 04/03/2019   K 3.7 04/03/2019   CL 99 04/03/2019   CO2 26 04/03/2019   Lab Results  Component Value Date   ALT 55 (H) 04/03/2019   AST 42 (H) 04/03/2019   ALKPHOS 91 04/03/2019   BILITOT 1.2 04/03/2019   Lab Results  Component Value Date   WBC 9.0 04/03/2019   HGB 13.1 04/03/2019   HCT 41.7 04/03/2019   MCV  96.1 04/03/2019   PLT 145 (L) 04/03/2019   Lab Results  Component Value Date   INR 1.1 12/05/2018   INR 1.1 08/27/2018   INR 1.1 12/25/2017    Imaging Studies: No results found.

## 2019-06-10 NOTE — Telephone Encounter (Signed)
Patient has been seen in office yesterday.

## 2019-06-19 ENCOUNTER — Other Ambulatory Visit: Payer: Self-pay

## 2019-06-19 ENCOUNTER — Ambulatory Visit (INDEPENDENT_AMBULATORY_CARE_PROVIDER_SITE_OTHER): Payer: Medicaid Other | Admitting: General Surgery

## 2019-06-19 ENCOUNTER — Encounter: Payer: Self-pay | Admitting: General Surgery

## 2019-06-19 VITALS — BP 128/86 | HR 87 | Temp 97.9°F | Resp 12 | Ht 63.0 in | Wt 248.0 lb

## 2019-06-19 DIAGNOSIS — K8020 Calculus of gallbladder without cholecystitis without obstruction: Secondary | ICD-10-CM | POA: Diagnosis not present

## 2019-06-19 NOTE — Progress Notes (Signed)
Whitney Bush; [id number]; [birth year]   HPI Patient is a 49 year old black female who was referred to my care by Annamaria Boots and Neil Crouch for evaluation treatment of cholelithiasis.  Patient states that when she bends over, she has right upper quadrant abdominal pain.  She has a significant history of alcohol abuse and cirrhosis of the liver.  She has been trying to abstain from alcohol, but recently has had many factors in her life causing her stress, thus she is now drinking multiple beers a day.  She denies any fatty food intolerance, fever, chills, or jaundice.  She denies any nausea or vomiting.  She currently has 0 out of 10 abdominal pain.  She was wondering whether the gallstone is causing her symptoms. Past Medical History:  Diagnosis Date  . Alcohol abuse   . 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 Anemia   . Blood transfusion without reported diagnosis   . Boil 06/04/2015  . Chronic kidney disease   . Clotting disorder ([place])   . COPD (chronic obstructive pulmonary disease) ([place])   . Depression   . GERD (gastroesophageal reflux disease)   . GI (gastrointestinal bleed) 07/28/2015  . Heart disease   . Heart murmur   . Hyperlipidemia   . Hypertension   . Hypothyroidism   . Migraines   . OCD (obsessive compulsive disorder)   . Peripheral edema   . PTSD (post-traumatic stress disorder)   . Tubular adenoma     Past Surgical History:  Procedure Laterality Date  . ABCESS DRAINAGE     x5; neck, arm, chest, back  . BIOPSY  02/24/2015   Procedure: BIOPSY;  Surgeon: Danie Binder, MD;  [facility];  Service: Endoscopy;;  gastric bx's  . BIOPSY  12/25/2016   Procedure: BIOPSY;  Surgeon: Daneil Dolin, MD;  [facility];  Service: Endoscopy;;  gastric  . CESAREAN SECTION    . COLONOSCOPY WITH PROPOFOL N/A 06/28/2015   Dr.Rourk- inadequate prep- One 6 mm polyp at the splenic flexure bx= tubular adenoma  . ESOPHAGEAL BANDING N/A  12/25/2016   Procedure: ESOPHAGEAL BANDING with Propofol;  Surgeon: Daneil Dolin, MD;  [facility];  Service: Endoscopy;  Laterality: N/A;  possible banding of varices  . ESOPHAGOGASTRODUODENOSCOPY (EGD) WITH PROPOFOL N/A 02/24/2015   SLF: Grade II esophageal varices Moderate portal hypertensive gastropathy Mild erosive gastritis anemia likely due to many factors: gastritis, gastropathy, coagulopathy, chronic disease  . ESOPHAGOGASTRODUODENOSCOPY (EGD) WITH PROPOFOL N/A 12/25/2016   Rourk: 2 columns of grade 2 esophageal varices, 1 column of grade 3.  Mild portal hypertensive gastropathy, biopsy showing reactive gastropathy but no H. pylori.  Esophagus dilated to 59 [place]  . KNEE ARTHROSCOPY WITH MEDIAL MENISECTOMY Left 01/09/2019   Procedure: KNEE ARTHROSCOPY WITH MEDIAL MENISCECTOMY AND LATERAL MENISCECTOMY;  Surgeon: Carole Civil, MD;  Location: [facility];  Service: Orthopedics;  Laterality: Left;  Marland Kitchen MALONEY DILATION N/A 12/25/2016   Procedure: Venia Minks DILATION;  Surgeon: Daneil Dolin, MD;  [facility];  Service: Endoscopy;  Laterality: N/A;  . POLYPECTOMY  06/28/2015   Procedure: POLYPECTOMY;  Surgeon: Daneil Dolin, MD;  [facility];  Service: Endoscopy;;  at splenic flexure  . SUPRACERVICAL ABDOMINAL HYSTERECTOMY N/A 10/02/2017   Procedure: HYSTERECTOMY SUPRACERVICAL ABDOMINAL;  Surgeon: Jonnie Kind, MD;  Location: [facility];  Service: Gynecology;  Laterality: N/A;    Family History  Problem Relation Age of Onset  . Diabetes Father   . Hyperlipidemia Father   . Alcohol abuse Father   . Hypertension Father   . Cancer Other   . Cervical cancer Maternal Grandmother   . Lung cancer Maternal Grandfather   . Alcohol abuse Other        multiple family members  . Diabetes Sister   . Hypertension Sister   . Hypertension Brother   . Colon cancer Neg Hx   . Liver disease Neg Hx     Current Outpatient Medications on File Prior to Visit   Medication Sig Dispense Refill  . albuterol (PROVENTIL HFA;VENTOLIN HFA) 108 (90 Base) MCG/ACT inhaler Inhale 1-2 puffs into the lungs every 6 (six) hours as needed for wheezing or shortness of breath. 1 Inhaler 0  . B Complex-C (SUPER B COMPLEX PO) Take 1 capsule by mouth daily.     Marland Kitchen doxycycline (VIBRAMYCIN) 100 MG capsule Take 100 mg by mouth daily. For acne    . FLUoxetine (PROZAC) 20 MG capsule Take 20 mg by mouth daily.    . furosemide (LASIX) 40 MG tablet Take 1 tablet (40 mg total) by mouth daily. 30 tablet 5  . gabapentin (NEURONTIN) 100 MG capsule Take 1 capsule (100 mg total) by mouth 3 (three) times daily. 90 capsule 2  . lactulose (CONSTULOSE) 10 GM/15ML solution TAKE 30 MLS BY MOUTH TWICE DAILY AS NEEDED FOR MILD CONSTIPATION OR MODERATE CONSTIPATION. 1892 mL 5  . propranolol (INDERAL) 10 MG tablet Take 1 tablet (10 mg total) by mouth 2 (two) times daily. 90 tablet 3  . propranolol (INDERAL) 20 MG tablet Take 1 tablet (20 mg total) by mouth 2 (two) times daily. 60 tablet 5  . spironolactone (ALDACTONE) 50 MG tablet Take 1 tablet (50 mg total) by mouth daily. 30 tablet 5  . traZODone (DESYREL) 100 MG tablet Take 100 mg by mouth as needed.     . triamcinolone ointment (KENALOG) 0.1 % Apply 1 application topically 2 (two) times daily.     No current facility-administered medications on file prior to visit.    No Known Allergies  Social History   Substance and Sexual Activity  Alcohol Use Not Currently   Comment: patient denies (06/09/19)    Social History   Tobacco Use  Smoking Status Former Smoker  . Packs/day: 1.00  . Years: 5.00  . Pack years: 5.00  . Types: Cigarettes  . Quit date: 12/29/2010  . Years since quitting: 8.4  Smokeless Tobacco Former Systems developer  . Types: Snuff  . Quit date: 09/28/2010    Review of Systems  Constitutional: Positive for chills.  HENT: Negative.   Eyes: Positive for blurred vision, double vision and pain.  Respiratory: Positive for  shortness of breath and wheezing.   Cardiovascular: Negative.   Gastrointestinal: Positive for abdominal pain and heartburn.  Genitourinary: Negative.   Musculoskeletal: Positive for back pain, joint pain and neck pain.  Skin: Negative.   Neurological: Positive for headaches.  Endo/Heme/Allergies: Negative.   Psychiatric/Behavioral: Positive for depression. The patient is nervous/anxious.     Objective   Vitals:   06/19/19 0920  BP: 128/86  Pulse: 87  Resp: 12  Temp: 97.9 F (36.6 C)  SpO2: 98%    Physical Exam Vitals reviewed.  Constitutional:      Appearance: Normal appearance. She is obese. She is not ill-appearing.  HENT:     Head: Normocephalic and atraumatic.  Eyes:     General: No  scleral icterus. Cardiovascular:     Rate and Rhythm: Normal rate and regular rhythm.     Heart sounds: Normal heart sounds. No murmur. No friction rub. No gallop.   Pulmonary:     Effort: Pulmonary effort is normal. No respiratory distress.     Breath sounds: Normal breath sounds. No stridor. No wheezing, rhonchi or rales.  Abdominal:     General: Bowel sounds are normal. There is no distension.     Palpations: Abdomen is soft. There is no mass.     Tenderness: There is abdominal tenderness. There is no guarding or rebound.     Hernia: No hernia is present.     Comments: There was some discomfort to deep palpation just below the right costal margin.  There also was the suggestion that she may have ascites, but it was difficult to fully assess secondary to body habitus.  No point tenderness was noted in the right upper quadrant.  Skin:    General: Skin is warm and dry.  Neurological:     Mental Status: She is alert and oriented to person, place, and time.   Previous GI notes, ultrasound report, CT scan of the abdomen, and labs reviewed  Assessment  Abdominal discomfort most likely secondary to alcoholic cirrhosis, with relapse of EtOH.  She does have a gallstone, but she does not  have evidence of biliary colic secondary to cholelithiasis.  She has multiple stigmata of cirrhosis including relative thrombocytopenia, history of esophageal varices, possible splenomegaly. Plan   I told him the patient that cholecystectomy would not benefit her at this time.  She is at increased risk for surgical intervention due to the stigmata of cirrhosis.  I did encourage her to seek help in trying to abstain from alcohol.  I suspect that some of her discomfort is secondary to enlargement of the liver.  She was tearful and understood that she needs to try to abstain from alcohol.  Follow-up with me as needed.

## 2019-06-19 NOTE — Patient Instructions (Signed)

## 2019-07-14 ENCOUNTER — Telehealth: Payer: Self-pay | Admitting: Gastroenterology

## 2019-07-14 MED ORDER — PROPRANOLOL HCL 20 MG PO TABS: 20.0000 mg | ORAL_TABLET | Freq: Two times a day (BID) | ORAL | 5 refills | Status: DC

## 2019-07-14 NOTE — Telephone Encounter (Signed)
Noted. Spoke with pt. She is aware of LSL recommendations. Pt will start Propranolol 20 mg bid.

## 2019-07-14 NOTE — Telephone Encounter (Signed)
Please let pt know that pharmacy records shows she is taking propranolol 10mg  BID. Heart rate remains in the 70-80 range.   She needs to be on propranolol 20mg  BID. I will send in new RX. Please let pt know.

## 2019-08-19 ENCOUNTER — Other Ambulatory Visit: Payer: Self-pay | Admitting: Gastroenterology

## 2019-08-19 NOTE — Telephone Encounter (Signed)
Patient should be receiving this medication from her PCP. We provided a temporary refill 1 year ago.

## 2019-08-20 NOTE — Telephone Encounter (Signed)
A detailed message was left for pts pharmacy. They are aware that this refill request needs to go to pts PCP.

## 2019-09-07 ENCOUNTER — Other Ambulatory Visit: Payer: Self-pay | Admitting: Gastroenterology

## 2019-09-09 ENCOUNTER — Other Ambulatory Visit: Payer: Self-pay

## 2019-09-24 ENCOUNTER — Other Ambulatory Visit: Payer: Self-pay | Admitting: Gastroenterology

## 2019-11-08 ENCOUNTER — Other Ambulatory Visit: Payer: Self-pay

## 2019-11-08 ENCOUNTER — Emergency Department
Admission: EM | Admit: 2019-11-08 | Discharge: 2019-11-10 | Disposition: A | Discharge status: Other Acute Inpatient Hospital | Payer: Medicaid Other | Attending: Emergency Medicine | Admitting: Emergency Medicine

## 2019-11-08 DIAGNOSIS — Z20822 Contact with and (suspected) exposure to covid-19: Secondary | ICD-10-CM | POA: Insufficient documentation

## 2019-11-08 DIAGNOSIS — E039 Hypothyroidism, unspecified: Secondary | ICD-10-CM | POA: Insufficient documentation

## 2019-11-08 DIAGNOSIS — Z87891 Personal history of nicotine dependence: Secondary | ICD-10-CM | POA: Insufficient documentation

## 2019-11-08 DIAGNOSIS — Z79899 Other long term (current) drug therapy: Secondary | ICD-10-CM | POA: Diagnosis not present

## 2019-11-08 DIAGNOSIS — N189 Chronic kidney disease, unspecified: Secondary | ICD-10-CM | POA: Diagnosis not present

## 2019-11-08 DIAGNOSIS — R45850 Homicidal ideations: Secondary | ICD-10-CM | POA: Diagnosis not present

## 2019-11-08 DIAGNOSIS — I129 Hypertensive chronic kidney disease with stage 1 through stage 4 chronic kidney disease, or unspecified chronic kidney disease: Secondary | ICD-10-CM | POA: Insufficient documentation

## 2019-11-08 DIAGNOSIS — R4589 Other symptoms and signs involving emotional state: Secondary | ICD-10-CM | POA: Insufficient documentation

## 2019-11-08 DIAGNOSIS — Z7951 Long term (current) use of inhaled steroids: Secondary | ICD-10-CM | POA: Diagnosis not present

## 2019-11-08 DIAGNOSIS — J449 Chronic obstructive pulmonary disease, unspecified: Secondary | ICD-10-CM | POA: Diagnosis not present

## 2019-11-08 DIAGNOSIS — F259 Schizoaffective disorder, unspecified: Secondary | ICD-10-CM

## 2019-11-08 DIAGNOSIS — F10929 Alcohol use, unspecified with intoxication, unspecified: Secondary | ICD-10-CM

## 2019-11-08 DIAGNOSIS — F1020 Alcohol dependence, uncomplicated: Secondary | ICD-10-CM | POA: Diagnosis present

## 2019-11-08 LAB — COMPREHENSIVE METABOLIC PANEL
ALT: 37 U/L (ref 0–44)
AST: 58 U/L — ABNORMAL HIGH (ref 15–41)
Albumin: 4.1 g/dL (ref 3.5–5.0)
Alkaline Phosphatase: 103 U/L (ref 38–126)
Anion gap: 18 — ABNORMAL HIGH (ref 5–15)
BUN: 12 mg/dL (ref 6–20)
CO2: 22 mmol/L (ref 22–32)
Calcium: 8.6 mg/dL — ABNORMAL LOW (ref 8.9–10.3)
Chloride: 101 mmol/L (ref 98–111)
Creatinine, Ser: 0.89 mg/dL (ref 0.44–1.00)
GFR calc Af Amer: >60 mL/min (ref >60)
GFR calc non Af Amer: >60 mL/min (ref >60)
Glucose, Bld: 91 mg/dL (ref 70–99)
Potassium: 3.5 mmol/L (ref 3.5–5.1)
Sodium: 141 mmol/L (ref 135–145)
Total Bilirubin: 1 mg/dL (ref 0.3–1.2)
Total Protein: 8.9 g/dL — ABNORMAL HIGH (ref 6.5–8.1)

## 2019-11-08 LAB — CBC WITH DIFFERENTIAL/PLATELET
Abs Immature Granulocytes: 0.03 10*3/uL (ref 0.00–0.07)
Basophils Absolute: 0.1 10*3/uL (ref 0.0–0.1)
Basophils Relative: 1 %
Eosinophils Absolute: 0 10*3/uL (ref 0.0–0.5)
Eosinophils Relative: 0 %
HCT: 40.9 % (ref 36.0–46.0)
Hemoglobin: 13.7 g/dL (ref 12.0–15.0)
Immature Granulocytes: 0 %
Lymphocytes Relative: 29 %
Lymphs Abs: 2.6 10*3/uL (ref 0.7–4.0)
MCH: 30.6 pg (ref 26.0–34.0)
MCHC: 33.5 g/dL (ref 30.0–36.0)
MCV: 91.3 fL (ref 80.0–100.0)
Monocytes Absolute: 0.4 10*3/uL (ref 0.1–1.0)
Monocytes Relative: 5 %
Neutro Abs: 5.9 10*3/uL (ref 1.7–7.7)
Neutrophils Relative %: 65 %
Platelets: 155 10*3/uL (ref 150–400)
RBC: 4.48 MIL/uL (ref 3.87–5.11)
RDW: 14.5 % (ref 11.5–15.5)
WBC: 9 10*3/uL (ref 4.0–10.5)
nRBC: 0 % (ref 0.0–0.2)

## 2019-11-08 LAB — SARS CORONAVIRUS 2 BY RT PCR (HOSPITAL ORDER, PERFORMED IN ~~LOC~~ HOSPITAL LAB): SARS Coronavirus 2: NEGATIVE

## 2019-11-08 LAB — ETHANOL: Alcohol, Ethyl (B): 364 mg/dL (ref <10)

## 2019-11-08 MED ORDER — THIAMINE HCL 100 MG PO TABS
100.0000 mg | ORAL_TABLET | Freq: Every day | ORAL | Status: DC
  Administered 2019-11-08 – 2019-11-10 (×3): 100 mg via ORAL
  Filled 2019-11-08 (×3): qty 1

## 2019-11-08 MED ORDER — LORAZEPAM 2 MG/ML IJ SOLN: 0.0000 mg | Freq: Two times a day (BID) | INTRAMUSCULAR | Status: DC

## 2019-11-08 MED ORDER — PROPRANOLOL HCL 10 MG PO TABS
20.0000 mg | ORAL_TABLET | Freq: Two times a day (BID) | ORAL | Status: DC
  Administered 2019-11-08 – 2019-11-10 (×4): 20 mg via ORAL
  Filled 2019-11-08 (×4): qty 2

## 2019-11-08 MED ORDER — FUROSEMIDE 40 MG PO TABS
40.0000 mg | ORAL_TABLET | Freq: Every day | ORAL | Status: DC
  Administered 2019-11-08 – 2019-11-10 (×3): 40 mg via ORAL
  Filled 2019-11-08 (×3): qty 1

## 2019-11-08 MED ORDER — GABAPENTIN 300 MG PO CAPS
300.0000 mg | ORAL_CAPSULE | Freq: Three times a day (TID) | ORAL | Status: DC
  Administered 2019-11-08 – 2019-11-10 (×5): 300 mg via ORAL
  Filled 2019-11-08 (×4): qty 1

## 2019-11-08 MED ORDER — LORAZEPAM 2 MG PO TABS: 0.0000 mg | ORAL_TABLET | Freq: Two times a day (BID) | ORAL | Status: DC

## 2019-11-08 MED ORDER — LORAZEPAM 2 MG PO TABS
0.0000 mg | ORAL_TABLET | Freq: Four times a day (QID) | ORAL | Status: DC
  Administered 2019-11-09: 2 mg via ORAL
  Administered 2019-11-09: 0 mg via ORAL
  Filled 2019-11-08 (×2): qty 1

## 2019-11-08 MED ORDER — BENZTROPINE MESYLATE 1 MG PO TABS
0.5000 mg | ORAL_TABLET | Freq: Two times a day (BID) | ORAL | Status: DC
  Administered 2019-11-08 – 2019-11-10 (×3): 0.5 mg via ORAL
  Filled 2019-11-08 (×3): qty 1

## 2019-11-08 MED ORDER — CLONAZEPAM 1 MG PO TABS
1.0000 mg | ORAL_TABLET | Freq: Two times a day (BID) | ORAL | Status: DC
  Administered 2019-11-08 – 2019-11-10 (×4): 1 mg via ORAL
  Filled 2019-11-08 (×4): qty 1

## 2019-11-08 MED ORDER — THIAMINE HCL 100 MG/ML IJ SOLN: 100.0000 mg | Freq: Every day | INTRAMUSCULAR | Status: DC

## 2019-11-08 MED ORDER — THIOTHIXENE 5 MG PO CAPS
5.0000 mg | ORAL_CAPSULE | Freq: Two times a day (BID) | ORAL | Status: DC
  Administered 2019-11-08 – 2019-11-10 (×4): 5 mg via ORAL
  Filled 2019-11-08 (×6): qty 1

## 2019-11-08 MED ORDER — LORAZEPAM 2 MG/ML IJ SOLN: 0.0000 mg | Freq: Four times a day (QID) | INTRAMUSCULAR | Status: DC

## 2019-11-08 MED ORDER — NORTRIPTYLINE HCL 10 MG PO CAPS
20.0000 mg | ORAL_CAPSULE | Freq: Every day | ORAL | Status: DC
  Administered 2019-11-08 – 2019-11-09 (×2): 20 mg via ORAL
  Filled 2019-11-08 (×4): qty 2

## 2019-11-08 MED ORDER — GABAPENTIN 100 MG PO CAPS: 100.0000 mg | ORAL_CAPSULE | Freq: Three times a day (TID) | ORAL | Status: DC

## 2019-11-08 MED ORDER — LACTULOSE 10 GM/15ML PO SOLN
10.0000 g | Freq: Every day | ORAL | Status: DC
  Administered 2019-11-08 – 2019-11-10 (×3): 10 g via ORAL
  Filled 2019-11-08 (×3): qty 30

## 2019-11-08 MED ORDER — SPIRONOLACTONE 25 MG PO TABS
50.0000 mg | ORAL_TABLET | Freq: Every day | ORAL | Status: DC
  Administered 2019-11-08 – 2019-11-10 (×3): 50 mg via ORAL
  Filled 2019-11-08 (×3): qty 2

## 2019-11-08 MED ORDER — ALBUTEROL SULFATE HFA 108 (90 BASE) MCG/ACT IN AERS
1.0000 | INHALATION_SPRAY | Freq: Four times a day (QID) | RESPIRATORY_TRACT | Status: DC | PRN
  Filled 2019-11-08: qty 6.7

## 2019-11-08 MED ORDER — TRAZODONE HCL 50 MG PO TABS
150.0000 mg | ORAL_TABLET | Freq: Every day | ORAL | Status: DC
  Administered 2019-11-08 – 2019-11-09 (×2): 150 mg via ORAL
  Filled 2019-11-08 (×2): qty 1

## 2019-11-08 MED ORDER — LORAZEPAM 1 MG PO TABS
1.0000 mg | ORAL_TABLET | Freq: Once | ORAL | Status: AC
  Administered 2019-11-08: 1 mg via ORAL
  Filled 2019-11-08: qty 1

## 2019-11-08 NOTE — ED Notes (Signed)
Pt in and out of the bathroom with steady gait.  Expressing wanting to leave.
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
## 2019-11-08 NOTE — BH Assessment (Signed)
Writer unable to complete TTS consult due to patient unable to participate. Patient too intoxicated.

## 2019-11-08 NOTE — ED Notes (Addendum)
Patient belongings have been bagged and tagged.  Patient is refusing to take off wig, necklace, and does not appear to be able to remove rings on hands due to swelling.
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
## 2019-11-08 NOTE — Consult Note (Signed)
[facility] Consult   Reason for Consult:  Schizoaffective Disorder   ETOH dependence /  Referring Physician:  ER MD    Patient Identification: Whitney Bush MRN:  [id number] Principal Diagnosis: <principal problem not specified>     Diagnosis:  Active Problems:   * No active hospital problems. *   Total Time spent with patient: 30- 40  Subjective:   Whitney Bush is a 49 y.o. female patient admitted with   Post ETOH intoxication, worsening ---Schizoaffective symptoms   Now on IVC and needs admission for risk of clinical deterioration if discharged  Much history from her Mother, next of kin   Ongoing ETOH dependence, on CIWA for possible withdrawal.  At least six months   Brought in by police and all And on IVC when she thought neighbors were attacking her and taking her items.  She is paranoid fearful and suspicious.  She is illogical and at times not making sense  She has worsening major depression along with generalized anxiety   She lives in dysfunctional state and does not have disability yet.   Mom reports she has poor insight judgement and does not follow up with medication  Appointments or ETOH recovery    She has not seemingly had a history of depot injectables just yet    She needs admission ----for dual diagnosis, med mgt, risk of clinical deterioration ----at this time     HPI:  As above   Past Psychiatric History:  See above   Risk to Self:  yes  Risk to Others:  no  Prior Inpatient Therapy:  yes Prior Outpatient Therapy:  yes but not regular   Past Medical History:  Past Medical History:  Diagnosis Date  . Alcohol abuse   . 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 Anemia   . Blood transfusion without reported diagnosis   . Boil 06/04/2015  . Chronic kidney disease   . Clotting disorder ([place])   . COPD (chronic obstructive pulmonary disease) ([place])   . Depression   . GERD (gastroesophageal  reflux disease)   . GI (gastrointestinal bleed) 07/28/2015  . Heart disease   . Heart murmur   . Hyperlipidemia   . Hypertension   . Hypothyroidism   . Migraines   . OCD (obsessive compulsive disorder)   . Peripheral edema   . PTSD (post-traumatic stress disorder)   . Tubular adenoma     Past Surgical History:  Procedure Laterality Date  . ABCESS DRAINAGE     x5; neck, arm, chest, back  . BIOPSY  02/24/2015   Procedure: BIOPSY;  Surgeon: Danie Binder, MD;  [facility];  Service: Endoscopy;;  gastric bx's  . BIOPSY  12/25/2016   Procedure: BIOPSY;  Surgeon: Daneil Dolin, MD;  [facility];  Service: Endoscopy;;  gastric  . CESAREAN SECTION    . COLONOSCOPY WITH PROPOFOL N/A 06/28/2015   Dr.Rourk- inadequate prep- One 6 mm polyp at the splenic flexure bx= tubular adenoma  . ESOPHAGEAL BANDING N/A 12/25/2016   Procedure: ESOPHAGEAL BANDING with Propofol;  Surgeon: Daneil Dolin, MD;  [facility];  Service: Endoscopy;  Laterality: N/A;  possible banding of varices  . ESOPHAGOGASTRODUODENOSCOPY (EGD) WITH PROPOFOL N/A 02/24/2015   SLF: Grade II esophageal varices Moderate portal hypertensive gastropathy Mild erosive gastritis anemia likely due to many factors: gastritis, gastropathy, coagulopathy, chronic disease  . ESOPHAGOGASTRODUODENOSCOPY (EGD)  WITH PROPOFOL N/A 12/25/2016   Rourk: 2 columns of grade 2 esophageal varices, 1 column of grade 3.  Mild portal hypertensive gastropathy, biopsy showing reactive gastropathy but no H. pylori.  Esophagus dilated to 46 [place]  . KNEE ARTHROSCOPY WITH MEDIAL MENISECTOMY Left 01/09/2019   Procedure: KNEE ARTHROSCOPY WITH MEDIAL MENISCECTOMY AND LATERAL MENISCECTOMY;  Surgeon: Carole Civil, MD;  Location: [facility];  Service: Orthopedics;  Laterality: Left;  Marland Kitchen MALONEY DILATION N/A 12/25/2016   Procedure: Venia Minks DILATION;  Surgeon: Daneil Dolin, MD;  [facility];  Service: Endoscopy;   Laterality: N/A;  . POLYPECTOMY  06/28/2015   Procedure: POLYPECTOMY;  Surgeon: Daneil Dolin, MD;  [facility];  Service: Endoscopy;;  at splenic flexure  . SUPRACERVICAL ABDOMINAL HYSTERECTOMY N/A 10/02/2017   Procedure: HYSTERECTOMY SUPRACERVICAL ABDOMINAL;  Surgeon: Jonnie Kind, MD;  Location: [facility];  Service: Gynecology;  Laterality: N/A;   Family History:  Family History  Problem Relation Age of Onset  . Diabetes Father   . Hyperlipidemia Father   . Alcohol abuse Father   . Hypertension Father   . Cancer Other   . Cervical cancer Maternal Grandmother   . Lung cancer Maternal Grandfather   . Alcohol abuse Other        multiple family members  . Diabetes Sister   . Hypertension Sister   . Hypertension Brother   . Colon cancer Neg Hx   . Liver disease Neg Hx    Family Psychiatric  History:  Bio dad with history of psychosis and mania and substance issues  Mom with history of anxiety and depression    Social History:  Social History   Substance and Sexual Activity  Alcohol Use Yes   Comment: Patient is intoxicated on 11/08/2019     Social History   Substance and Sexual Activity  Drug Use No    Social History   Socioeconomic History  . Marital status: Widowed    Spouse name: Not on file  . Number of children: 1  . Years of education: 56  . Highest education level: Not on file  Occupational History  . Occupation: unemployed  Tobacco Use  . Smoking status: Former Smoker    Packs/day: 1.00    Years: 5.00    Pack years: 5.00    Types: Cigarettes    Quit date: 12/29/2010    Years since quitting: 8.8  . Smokeless tobacco: Former Systems developer    Types: Snuff    Quit date: 09/28/2010  Vaping Use  . Vaping Use: Never used  Substance and Sexual Activity  . Alcohol use: Yes    Comment: Patient is intoxicated on 11/08/2019  . Drug use: No  . Sexual activity: Yes    Birth control/protection: Surgical  Other Topics Concern  . Not on file  Social History  Narrative   Lives with parents   Social Determinants of Health   Financial Resource Strain:   . Difficulty of Paying Living Expenses: Not on file  Food Insecurity:   . Worried About Charity fundraiser in the Last Year: Not on file  . Ran Out of Food in the Last Year: Not on file  Transportation Needs:   . Lack of Transportation (Medical): Not on file  . Lack of Transportation (Non-Medical): Not on file  Physical Activity:   . Days of Exercise per Week: Not on file  . Minutes of Exercise per Session: Not on file  Stress:   .  Feeling of Stress : Not on file  Social Connections:   . Frequency of Communication with Friends and Family: Not on file  . Frequency of Social Gatherings with Friends and Family: Not on file  . Attends Religious Services: Not on file  . Active Member of Clubs or Organizations: Not on file  . Attends Archivist Meetings: Not on file  . Marital Status: Not on file   Additional Social History:  Lives alone allegedly has boyfriend but it is not clear   --cannot function on her own in current state     Allergies:  No Known Allergies  Labs:  Results for orders placed or performed during the hospital encounter of 11/08/19 (from the past 48 hour(s))  SARS Coronavirus 2 by RT PCR (hospital order, performed in [facility] hospital lab) Nasopharyngeal Nasopharyngeal Swab     Status: None   Collection Time: 11/08/19  2:46 PM   Specimen: Nasopharyngeal Swab  Result Value Ref Range   SARS Coronavirus 2 NEGATIVE NEGATIVE    Comment: (NOTE) SARS-CoV-2 target nucleic acids are NOT DETECTED.  The SARS-CoV-2 RNA is generally detectable in upper and lower respiratory specimens during the acute phase of infection. The lowest concentration of SARS-CoV-2 viral copies this assay can detect is 250 copies / mL. A negative result does not preclude SARS-CoV-2 infection and should not be used as the sole basis for treatment or other patient management decisions.  A  negative result may occur with improper specimen collection / handling, submission of specimen other than nasopharyngeal swab, presence of viral mutation(s) within the areas targeted by this assay, and inadequate number of viral copies (<250 copies / mL). A negative result must be combined with clinical observations, patient history, and epidemiological information.  Fact Sheet for Patients:   StrictlyIdeas.no  Fact Sheet for Healthcare Providers: BankingDealers.co.za  This test is not yet approved or  cleared by the [place] FDA and has been authorized for detection and/or diagnosis of SARS-CoV-2 by FDA under an Emergency Use Authorization (EUA).  This EUA will remain in effect (meaning this test can be used) for the duration of the COVID-19 declaration under Section 564(b)(1) of the Act, 21 U.S.C. section 360bbb-3(b)(1), unless the authorization is terminated or revoked sooner.  Performed at [facility], [address] [place], [place] [address]   Ethanol     Status: Abnormal   Collection Time: 11/08/19  2:46 PM  Result Value Ref Range   Alcohol, Ethyl (B) 364 (HH) <10 mg/dL    Comment: CRITICAL RESULT CALLED TO, READ BACK BY AND VERIFIED WITH ROYCE BARHAM RN AT 1528 ON 11/08/19 SNG (NOTE) Lowest detectable limit for serum alcohol is 10 mg/dL.  For medical purposes only. Performed at [facility], [address] [place], [place] [address]   Comprehensive metabolic panel     Status: Abnormal   Collection Time: 11/08/19  2:46 PM  Result Value Ref Range   Sodium 141 135 - 145 mmol/L   Potassium 3.5 3.5 - 5.1 mmol/L   Chloride 101 98 - 111 mmol/L   CO2 22 22 - 32 mmol/L   Glucose, Bld 91 70 - 99 mg/dL    Comment: Glucose reference range applies only to samples taken after fasting for at least 8 hours.   BUN 12 6 - 20 mg/dL   Creatinine, Ser 0.89 0.44 - 1.00 mg/dL   Calcium 8.6 (L) 8.9 - 10.3 mg/dL    Total Protein 8.9 (H) 6.5 -  8.1 g/dL   Albumin 4.1 3.5 - 5.0 g/dL   AST 58 (H) 15 - 41 U/L   ALT 37 0 - 44 U/L   Alkaline Phosphatase 103 38 - 126 U/L   Total Bilirubin 1.0 0.3 - 1.2 mg/dL   GFR calc non Af Amer >60 >60 mL/min   GFR calc Af Amer >60 >60 mL/min   Anion gap 18 (H) 5 - 15    Comment: Performed at [facility], [address] [place], [place] [address]  CBC with Differential     Status: None   Collection Time: 11/08/19  2:46 PM  Result Value Ref Range   WBC 9.0 4.0 - 10.5 K/uL   RBC 4.48 3.87 - 5.11 MIL/uL   Hemoglobin 13.7 12.0 - 15.0 g/dL   HCT 40.9 36 - 46 %   MCV 91.3 80.0 - 100.0 fL   MCH 30.6 26.0 - 34.0 pg   MCHC 33.5 30.0 - 36.0 g/dL   RDW 14.5 11.5 - 15.5 %   Platelets 155 150 - 400 K/uL   nRBC 0.0 0.0 - 0.2 %   Neutrophils Relative % 65 %   Neutro Abs 5.9 1.7 - 7.7 K/uL   Lymphocytes Relative 29 %   Lymphs Abs 2.6 0.7 - 4.0 K/uL   Monocytes Relative 5 %   Monocytes Absolute 0.4 0 - 1 K/uL   Eosinophils Relative 0 %   Eosinophils Absolute 0.0 0 - 0 K/uL   Basophils Relative 1 %   Basophils Absolute 0.1 0 - 0 K/uL   Immature Granulocytes 0 %   Abs Immature Granulocytes 0.03 0.00 - 0.07 K/uL    Comment: Performed at [facility], [address] [place], [place] [address]    Current Facility-Administered Medications  Medication Dose Route Frequency Provider Last Rate Last Admin  . albuterol (VENTOLIN HFA) 108 (90 Base) MCG/ACT inhaler 1-2 puff  1-2 puff Inhalation Q6H PRN Eulas Post, MD      . benztropine (COGENTIN) tablet 0.5 mg  0.5 mg Oral BID Eulas Post, MD      . clonazePAM [place]bye [place]) tablet 1 mg  1 mg Oral BID Eulas Post, MD      . furosemide (LASIX) tablet 40 mg  40 mg Oral Daily Eulas Post, MD      . gabapentin (NEURONTIN) capsule 300 mg  300 mg Oral TID Eulas Post, MD      . lactulose ([place]) 10 GM/15ML solution 10 g  10 g Oral Daily Eulas Post, MD      . nortriptyline (PAMELOR)  capsule 20 mg  20 mg Oral QHS Eulas Post, MD      . propranolol (INDERAL) tablet 20 mg  20 mg Oral BID Eulas Post, MD      . spironolactone (ALDACTONE) tablet 50 mg  50 mg Oral Daily Eulas Post, MD      . thiothixene (NAVANE) capsule 5 mg  5 mg Oral BID Eulas Post, MD      . traZODone (DESYREL) tablet 150 mg  150 mg Oral QHS Eulas Post, MD       Current Outpatient Medications  Medication Sig Dispense Refill  . albuterol (PROVENTIL HFA;VENTOLIN HFA) 108 (90 Base) MCG/ACT inhaler Inhale 1-2 puffs into the lungs every 6 (six) hours as needed for wheezing or shortness of breath. 1 Inhaler 0  . B Complex-C (SUPER B COMPLEX PO) Take 1 capsule by mouth daily.     Marland Kitchen doxycycline (VIBRAMYCIN) 100 MG capsule  Take 100 mg by mouth daily. For acne    . FLUoxetine (PROZAC) 20 MG capsule Take 20 mg by mouth daily.    . furosemide (LASIX) 40 MG tablet Take 1 tablet by mouth once daily 30 tablet 5  . gabapentin (NEURONTIN) 100 MG capsule Take 1 capsule (100 mg total) by mouth 3 (three) times daily. 90 capsule 2  . lactulose (CONSTULOSE) 10 GM/15ML solution TAKE 30 MLS BY MOUTH TWICE DAILY AS NEEDED FOR MILD CONSTIPATION OR MODERATE CONSTIPATION. 1892 mL 5  . propranolol (INDERAL) 20 MG tablet Take 1 tablet (20 mg total) by mouth 2 (two) times daily. 60 tablet 5  . spironolactone (ALDACTONE) 50 MG tablet Take 1 tablet (50 mg total) by mouth daily. 30 tablet 5  . traZODone (DESYREL) 100 MG tablet Take 100 mg by mouth as needed.     . triamcinolone ointment (KENALOG) 0.1 % Apply 1 application topically 2 (two) times daily.      Musculoskeletal: Strength & Muscle Tone: no new change  Gait & Station: unsteady with ETOH  Patient leans: n./a   Psychiatric Specialty Exam: Physical Exam  Review of Systems  Blood pressure 105/63, pulse (!) 114, temperature 98.4 F (36.9 C), temperature source Oral, resp. rate (!) 21, height 5\' 3"  (1.6 m), weight 112.5 kg, last menstrual period  09/12/2017, SpO2 99 %.Body mass index is 43.93 kg/m.    Mental Status  Alert somewhat cooperative eccentric odd and strange Appearance, haggard, disheveled messy --unkept forlorn Oriented times four Consciousness not clouded or fluctuant Mood depressed affect constricted but also labile Thought process and content --illogical---not making sense has LOA ---along with paranoid delusions ---- Suspicious fearful strange Speech --strange erratic, periodically whispers in strange manner Eye contact strange and odd Rapport very odd and strange, bizarre Memory ---not reliable Fund of knowledge, intelligence below average Judgement insight reliability all poor No shakes tics tremors so far SI and HI --no HI    Unclear safety margin she is too vague Abstraction poor   She is vague and strange with incomplete answers not reliable                                                    Recall poor Language English Akathisia none Handedness none Aims not done Assets not clear  ADL's---affected by ETOH and psychosis Cognition impaired Sleep erratic      Treatment Plan Summary:  AA female with severe ETOH intoxication, needing CIWA protocol but also admission for ongoing severe S/A issues, non compliance on meds  [facility] transfer to [facility] pending bed transfer for inpatient   Remains in poor state of functioning with unclear safety margin     Disposition:  Inpatient admission  On IVC  Eulas Post, MD 11/08/2019 4:46 PM

## 2019-11-08 NOTE — ED Notes (Signed)
Hourly rounding reveals patient sleeping in room. No complaints, stable, in no acute distress. Q15 minute rounds and monitoring via Security Cameras to continue. 

## 2019-11-08 NOTE — ED Provider Notes (Signed)
Procedures     ----------------------------------------- 5:14 PM on 11/08/2019 -----------------------------------------   D/w psych Dr. Janese Banks who will plan for psych admission.    Carrie Mew, MD 11/08/19 1714

## 2019-11-08 NOTE — ED Notes (Signed)
This RN attempted to remove the necklace, wig and rings that remained on pt. Unable to remove any of the items. Report to Dominica Severin , RN and items will remain on pt. Pt to [place].

## 2019-11-08 NOTE — ED Notes (Signed)
Pt. Transferred to [facility] from ED to room 5 after screening for contraband. Report to include Situation, Background, Assessment and Recommendations from [facility]. Pt. Oriented to unit including Q15 minute rounds as well as the security cameras for their protection. Patient is alert and oriented, warm and dry in no acute distress. Patient denies SI, HI, and AVH. Pt. Encouraged to let me know if needs arise.

## 2019-11-08 NOTE — BH Assessment (Signed)
Assessment Note  Whitney Bush is an 49 y.o. female. Per triage note: Patient arrived via EMS from home. Patient is AOx4 and ambulatory however appears and EMS states they can smell ETOH on patient. EMS was originally called for swelling around arm from recent COVID vaccine. However, when EMS arrived patient began to talk about neighbors and how they call the police on patient. Patient mother showed up and the situation became more tense and EMS requested law enforcement. Patient was and is not currently aggressive. Patient did state she did want to kill her landlord upon arrival to the ED.   Pt in scrubs and appearance was disheveled. Patient drowsy but oriented x4. Pt speech is slurred. Patient appeared slightly impaired by the effects of alcohol. Eye contact was fair. Pt's mood was sullen, affect was flat. Thought process is coherent and relevant. There is no indication Pt is currently responding to internal stimuli or experiencing delusional thought content. Pt was guarded and defensive throughout assessment. Pt reported that she lives alone. The patient reported she has been prescribed pills for her mood swings but could not recall the names. The patient reported that she has family supports. Pt denied any previous suicide attempts or psychiatric admissions. The patient had limited insight and many of her responses were contradictory throughout the interview. Pt denied any physical, sexual, and emotional abuse. Pt denied symptoms of depression. The patient reported multiple family members have passed away including but not limited to her husband. The patient reported increased sleep as she takes medicines and sleeps all day due to increased stress. The patient reported that she can only sleep when others are around. Patient reported a poor appetite and recent weight gain. The patient admitted to alcohol use, reporting that she drinks a pt of vodka and 3 beers when she drinks. The patient denied the use  of any other drugs. The patient reported that she has criminal charges (Assault on an officer and a DUI). Patient reported that her next court date is 11/22/19 and that she is on supervised probation. The patient denied SI, HI, AV/H or symptoms of paranoia.   Diagnosis: 303.90 F10.20  Alcohol use disorder, Severe  Past Medical History:  Past Medical History:  Diagnosis Date  . Alcohol abuse   . 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 Anemia   . Blood transfusion without reported diagnosis   . Boil 06/04/2015  . Chronic kidney disease   . Clotting disorder ([place])   . COPD (chronic obstructive pulmonary disease) ([place])   . Depression   . GERD (gastroesophageal reflux disease)   . GI (gastrointestinal bleed) 07/28/2015  . Heart disease   . Heart murmur   . Hyperlipidemia   . Hypertension   . Hypothyroidism   . Migraines   . OCD (obsessive compulsive disorder)   . Peripheral edema   . PTSD (post-traumatic stress disorder)   . Tubular adenoma     Past Surgical History:  Procedure Laterality Date  . ABCESS DRAINAGE     x5; neck, arm, chest, back  . BIOPSY  02/24/2015   Procedure: BIOPSY;  Surgeon: Danie Binder, MD;  [facility];  Service: Endoscopy;;  gastric bx's  . BIOPSY  12/25/2016   Procedure: BIOPSY;  Surgeon: Daneil Dolin, MD;  [facility];  Service: Endoscopy;;  gastric  . CESAREAN SECTION    . COLONOSCOPY WITH PROPOFOL N/A 06/28/2015  Dr.Rourk- inadequate prep- One 6 mm polyp at the splenic flexure bx= tubular adenoma  . ESOPHAGEAL BANDING N/A 12/25/2016   Procedure: ESOPHAGEAL BANDING with Propofol;  Surgeon: Daneil Dolin, MD;  [facility];  Service: Endoscopy;  Laterality: N/A;  possible banding of varices  . ESOPHAGOGASTRODUODENOSCOPY (EGD) WITH PROPOFOL N/A 02/24/2015   SLF: Grade II esophageal varices Moderate portal hypertensive gastropathy Mild erosive gastritis anemia likely due to many factors:  gastritis, gastropathy, coagulopathy, chronic disease  . ESOPHAGOGASTRODUODENOSCOPY (EGD) WITH PROPOFOL N/A 12/25/2016   Rourk: 2 columns of grade 2 esophageal varices, 1 column of grade 3.  Mild portal hypertensive gastropathy, biopsy showing reactive gastropathy but no H. pylori.  Esophagus dilated to 58 [place]  . KNEE ARTHROSCOPY WITH MEDIAL MENISECTOMY Left 01/09/2019   Procedure: KNEE ARTHROSCOPY WITH MEDIAL MENISCECTOMY AND LATERAL MENISCECTOMY;  Surgeon: Carole Civil, MD;  Location: [facility];  Service: Orthopedics;  Laterality: Left;  Marland Kitchen MALONEY DILATION N/A 12/25/2016   Procedure: Venia Minks DILATION;  Surgeon: Daneil Dolin, MD;  [facility];  Service: Endoscopy;  Laterality: N/A;  . POLYPECTOMY  06/28/2015   Procedure: POLYPECTOMY;  Surgeon: Daneil Dolin, MD;  [facility];  Service: Endoscopy;;  at splenic flexure  . SUPRACERVICAL ABDOMINAL HYSTERECTOMY N/A 10/02/2017   Procedure: HYSTERECTOMY SUPRACERVICAL ABDOMINAL;  Surgeon: Jonnie Kind, MD;  Location: [facility];  Service: Gynecology;  Laterality: N/A;    Family History:  Family History  Problem Relation Age of Onset  . Diabetes Father   . Hyperlipidemia Father   . Alcohol abuse Father   . Hypertension Father   . Cancer Other   . Cervical cancer Maternal Grandmother   . Lung cancer Maternal Grandfather   . Alcohol abuse Other        multiple family members  . Diabetes Sister   . Hypertension Sister   . Hypertension Brother   . Colon cancer Neg Hx   . Liver disease Neg Hx     Social History:  reports that she quit smoking about 8 years ago. Her smoking use included cigarettes. She has a 5.00 pack-year smoking history. She quit smokeless tobacco use about 9 years ago.  Her smokeless tobacco use included snuff. She reports current alcohol use. She reports that she does not use drugs.  Additional Social History:  Alcohol / Drug Use Pain Medications: See PTA Prescriptions: See PTA History of  alcohol / drug use?: Yes Negative Consequences of Use: Personal relationships, Legal, Financial Substance #1 Name of Substance 1: Alcohol 1 - Amount (size/oz): 1 pt of vodka; 3 beers 1 - Last Use / Amount: 11-08-19  CIWA: CIWA-Ar BP: (!) 166/99 Pulse Rate: (!) 114 Nausea and Vomiting: no nausea and no vomiting Tactile Disturbances: none Tremor: no tremor Auditory Disturbances: not present Paroxysmal Sweats: no sweat visible Visual Disturbances: not present Anxiety: moderately anxious, or guarded, so anxiety is inferred Headache, Fullness in Head: none present Agitation: normal activity Orientation and Clouding of Sensorium: oriented and can do serial additions CIWA-Ar Total: 4 COWS:    Allergies: No Known Allergies  Home Medications: (Not in a hospital admission)   OB/GYN Status:  Patient's last menstrual period was 09/12/2017 (exact date).  General Assessment Data Location of Assessment: [facility] ED TTS Assessment: In system Is this a Tele or Face-to-Face Assessment?: Face-to-Face Is this an Initial Assessment or a Re-assessment for this encounter?: Initial Assessment Patient Accompanied by:: N/A Language Other than English: No Living Arrangements: Other (Comment) (  Pt lives alone) What gender do you identify as?: Female Date Telepsych consult ordered in [facility]: 11/08/19 Time Telepsych consult ordered in [facility]: [place] Marital status: Single Maiden name: n/a Pregnancy Status: No Living Arrangements: Alone Can pt return to current living arrangement?: Yes Admission Status: Involuntary Petitioner: ED Attending Is patient capable of signing voluntary admission?: Yes Referral Source: Self/Family/Friend Insurance type: Medicaid [place]  Medical Screening Exam ([place]) Medical Exam completed: Yes  Crisis Care Plan Living Arrangements: Alone Legal Guardian: Other: (Self) Name of Psychiatrist: None noted Name of Therapist: None Noted  Education Status Is patient  currently in school?: No Is the patient employed, unemployed or receiving disability?: Unemployed  Risk to self with the past 6 months Suicidal Ideation: No Has patient been a risk to self within the past 6 months prior to admission? : No Suicidal Intent: No Has patient had any suicidal intent within the past 6 months prior to admission? : No Is patient at risk for suicide?: No Suicidal Plan?: No Has patient had any suicidal plan within the past 6 months prior to admission? : No Access to Means: No What has been your use of drugs/alcohol within the last 12 months?: Alcohol Previous Attempts/Gestures: No How many times?: 0 Other Self Harm Risks: None noted Triggers for Past Attempts: None known Intentional Self Injurious Behavior: None Family Suicide History: Unknown Recent stressful life event(s): Financial Problems, Legal Issues, Conflict (Comment) Persecutory voices/beliefs?: No Depression: No Depression Symptoms:  (Pt denied feelings of depression) Substance abuse history and/or treatment for substance abuse?: Yes Suicide prevention information given to non-admitted patients: Not applicable  Risk to Others within the past 6 months Homicidal Ideation: No Does patient have any lifetime risk of violence toward others beyond the six months prior to admission? : No Thoughts of Harm to Others: No Current Homicidal Intent: No-Not Currently/Within Last 6 Months Current Homicidal Plan: No Access to Homicidal Means: No Identified Victim: n/a History of harm to others?: No Assessment of Violence: None Noted Violent Behavior Description: n/a Does patient have access to weapons?: No Criminal Charges Pending?: Yes Describe Pending Criminal Charges: Patient has a DUI Does patient have a court date: Yes (November 22, 2019) Court Date: 11/22/19 Is patient on probation?: Yes (Unsupervised)  Psychosis Hallucinations: None noted Delusions: None noted  Mental Status  Report Appearance/Hygiene: Disheveled Eye Contact: Good Motor Activity: Freedom of movement Speech: Logical/coherent Level of Consciousness: Quiet/awake Mood: Anxious, Despair Affect: Appropriate to circumstance Anxiety Level: Moderate Thought Processes: Coherent, Relevant Judgement: Impaired Orientation: Person, Place, Time, Situation Obsessive Compulsive Thoughts/Behaviors: None  Cognitive Functioning Concentration: Normal Memory: Recent Intact, Remote Intact Is patient IDD: No Insight: Poor Impulse Control: Poor Appetite: Poor Have you had any weight changes? : No Change Sleep: Increased Vegetative Symptoms: Staying in bed  ADLScreening [facility] Assessment Services) Patient's cognitive ability adequate to safely complete daily activities?: Yes Patient able to express need for assistance with ADLs?: Yes Independently performs ADLs?: Yes (appropriate for developmental age)  Prior Inpatient Therapy Prior Inpatient Therapy: No  Prior Outpatient Therapy Prior Outpatient Therapy: Yes ([facility] in [place]) Prior Therapy Dates:  ("Years ago" per pt report) Prior Therapy Facilty/Provider(s): [facility] [place] Reason for Treatment: Unable to recall diagnosis Does patient have an ACCT team?: No Does patient have Intensive In-House Services?  : No Does patient have [facility] services? : No Does patient have P4CC services?: No  ADL Screening (condition at time of admission) Patient's cognitive ability adequate to safely complete daily activities?: Yes Is the  patient deaf or have difficulty hearing?: No Does the patient have difficulty seeing, even when wearing glasses/contacts?: No Does the patient have difficulty concentrating, remembering, or making decisions?: No Patient able to express need for assistance with ADLs?: Yes Does the patient have difficulty dressing or bathing?: No Independently performs ADLs?: Yes (appropriate for developmental age) Does the patient have  difficulty walking or climbing stairs?: No Weakness of Legs: None Weakness of Arms/Hands: None  Home Assistive Devices/Equipment Home Assistive Devices/Equipment: None  Therapy Consults (therapy consults require a physician order) PT Evaluation Needed: No OT Evalulation Needed: No SLP Evaluation Needed: No   Values / Beliefs Cultural Requests During Hospitalization: None Spiritual Requests During Hospitalization: None Consults Spiritual Care Consult Needed: No Transition of Care Team Consult Needed: No Advance Directives (For Healthcare) Does Patient Have a Medical Advance Directive?: No Would patient like information on creating a medical advance directive?: No - Patient declined          Disposition: Per psych MD, Dr. Janese Banks pt is recommended for inpatient treatment due to dual dx of SA issues and schizoaffective symptoms.  Disposition Initial Assessment Completed for this Encounter: Yes  On Site Evaluation by:   Reviewed with Physician:    Kathi Ludwig 11/08/2019 11:05 PM

## 2019-11-09 NOTE — BH Assessment (Addendum)
Referral information for Psychiatric Hospitalization faxed to;   Marland Kitchen [facility] [phone]),   . [facility] ([phone]--[phone])  . [facility] [phone]), 00:56 Pt denied due to no appropriate beds.   [facility] [phone], [phone], [phone] or [phone]),   . [facility] [phone]), 6:49 AM No Answer  . [facility] [phone] -or- [phone]),   . [facility] [phone]),   . [facility] [phone]).   [facility] [phone])

## 2019-11-09 NOTE — Progress Notes (Signed)
   Brief Psych entry  Patient has been accepted to [facility] pending transfer   Soon   I already wrote meds and wrote consult prior    Janese Banks MD

## 2019-11-09 NOTE — ED Notes (Signed)
Hourly rounding reveals patient sleeping in room. No complaints, stable, in no acute distress. Q15 minute rounds and monitoring via Security Cameras to continue. 

## 2019-11-09 NOTE — ED Notes (Signed)
Pt given graham crackers, peanut butter, and sprite

## 2019-11-09 NOTE — BH Assessment (Signed)
Referral Check:   [facility] ([phone]- [phone]), [place] agrees to deliver message to provider to call back    [facility] ([phone]--[phone]) Received voicemail, unable to leave a message    [facility] [phone], [phone], [phone] or [phone]), Marcie Bal reports plans for the charge RN to follow up when in office after [place] [phone]) Premaine reports no bed availability today call back tomorrow   [facility] [phone] -or- [phone]), Heath Lark request refax; task completed 10am   [facility] [phone]), Left voicemail   [facility] [phone]). Left voicemail    [facility] [phone]) [place] transferred TTS, no one picked up

## 2019-11-09 NOTE — ED Notes (Signed)
Pt given dinner tray.

## 2019-11-09 NOTE — ED Notes (Addendum)
Patient just finished eating dinner and has returned her spoons each meal

## 2019-11-09 NOTE — BH Assessment (Addendum)
Patient has been accepted to [facility].  Patient assigned to: [place] [facility] [place] physician is Dr. Abbey Chatters.  Call report to [phone] Representative was [place].    ER Staff is aware of it:  [place], ER MD  Donneta Romberg Patient's Nurse     Patient's Family/Support System Hassan Rowan [place] (Mom), [phone]) have been updated as well.  TTS made Robbie aware of transportation issues today and Heath Lark agreed to hold pt bed for tomorrow

## 2019-11-10 NOTE — ED Provider Notes (Signed)
Emergency Medicine Observation Re-evaluation Note  Whitney Bush is a 49 y.o. female, seen on rounds today.  Pt initially presented to the ED for complaints of IVC and Alcohol Intoxication Currently, the patient is resting comfortably.  Physical Exam  BP (!) 125/94 (BP Location: Right Arm)   Pulse 88   Temp 98.8 F (37.1 C) (Oral)   Resp 16   Ht 5\' 3"  (1.6 m)   Wt 112.5 kg   LMP 09/12/2017 (Exact Date) Comment:    SpO2 99%   BMI 43.93 kg/m  Physical Exam General: No acute distress Cardiac: Extremities well perfused Lungs: Breathing unlabored Psych: Calm and cooperative  ED Course / MDM  EKG:    I have reviewed the labs performed to date as well as medications administered while in observation.  There have been no recent changes in the last 24 hours  Plan  Current plan is for placement. Patient is under full IVC at this time.   Naaman Plummer, MD 11/10/19 0800

## 2019-11-10 NOTE — ED Notes (Signed)
Patient voiced understanding of discharge instructions, all belongings given to driver, Patient states ' I wanted to go home' but she willingly got up and went with [facility] without any behaviors noted.

## 2019-11-10 NOTE — ED Notes (Signed)
Patient took shower, she is cooperative, no signs of distress at this time, will continue to monitor.

## 2019-11-10 NOTE — BH Assessment (Signed)
TTS spoke to pt's mom Geraldine Contras) who requested to receive an update and to talk with Dr. Janese Banks. TTS provide Hassan Rowan information around pt's current disposition and reminded her of the pt's acceptance to [facility] for INPT yesterday. Hassan Rowan reports pt to be calling family members requesting to be picked up and blaming her for everything. TTS emphasized the IVC process and answered all questions. Hassan Rowan is currently requesting a call from Dr. Janese Banks as well as, pt's daughter Erling Conte [place], [phone]).

## 2019-11-13 ENCOUNTER — Encounter (HOSPITAL_COMMUNITY): Payer: Self-pay | Admitting: Physical Therapy

## 2019-11-13 NOTE — Therapy (Signed)
[place], [place], [address] Phone: [phone]   Fax:  [phone]  Patient Details  Name: Whitney Bush MRN: [id number] Date of Birth: 1970/08/03 Referring Provider:  No ref. provider found  Encounter Date: 11/13/2019   PHYSICAL THERAPY DISCHARGE SUMMARY  Visits from Start of Care: 6   Current functional level related to goals / functional outcomes: Unknown as patient did not return to physical therapy, see last re-assessment on 05/01/19 for more detail.   Remaining deficits: Unknown as patient did not return to physical therapy, see last re-assessment on 05/01/19 for more detail.   Education / Equipment: HEP and following up with MD Plan: Patient agrees to discharge.  Patient goals were partially met. Patient is being discharged due to not returning since the last visit.  ?????     Clarene Critchley PT, DPT 10:58 AM, 11/13/19 [place], [place], [address] Phone: [phone]   Fax:  [phone]

## 2019-12-09 ENCOUNTER — Other Ambulatory Visit: Payer: Self-pay

## 2019-12-09 ENCOUNTER — Ambulatory Visit: Payer: Medicaid Other | Admitting: Gastroenterology

## 2019-12-09 ENCOUNTER — Encounter: Payer: Self-pay | Admitting: Gastroenterology

## 2019-12-09 ENCOUNTER — Telehealth: Payer: Self-pay | Admitting: *Deleted

## 2019-12-09 VITALS — BP 124/87 | HR 78 | Ht 63.0 in | Wt 256.6 lb

## 2019-12-09 DIAGNOSIS — K7030 Alcoholic cirrhosis of liver without ascites: Secondary | ICD-10-CM

## 2019-12-09 DIAGNOSIS — K5900 Constipation, unspecified: Secondary | ICD-10-CM

## 2019-12-09 MED ORDER — SPIRONOLACTONE 50 MG PO TABS: 50.0000 mg | ORAL_TABLET | Freq: Every day | ORAL | 5 refills | Status: DC

## 2019-12-09 NOTE — Telephone Encounter (Signed)
Called pt and is aware of RUQ U/S appt details. She voiced understanding

## 2019-12-09 NOTE — Progress Notes (Signed)
Primary Care Physician: Jerel Shepherd, [place]  Primary Gastroenterologist:  Garfield Cornea, MD   Chief Complaint  Patient presents with  . Cirrhosis    f/u. [facility] and was there for 2 weeks. Not drinking since  . Abdominal Pain    RUQ pain, comes/goes  . Nausea    HPI: Whitney Bush is a 49 y.o. female here for follow-up of alcoholic cirrhosis.  Last seen in April 2021.  She also has a history of IDA in the setting of heavy menses, underwent hysterectomy in August 2019.EGD October 2018 showed 2 columns of grade 2 esophageal varices, 1 column of grade 3. Mild portal hypertensive gastropathy, biopsy showing reactive gastropathy but no H. pylori. Esophagus dilated to 39 [place]. Colonoscopy May 2017, inadequate bowel prep. One6 mm polyp in the splenic flexure removed, tubular adenoma. Recommended 5-year follow-up colonoscopy.  Overdue on hepatoma screening. Seen in the ED on September 11 intoxicated and reporting homicidal ideation. Alcohol level of 364 on November 08, 2019.  AST 58, ALT 37, total bilirubin 1.0, alk phos 103, albumin 4.1. Evaluated by behavioral medicine in the ED. Required involuntary commitment. Spent two weeks in [facility]. No etoh since 11/08/19.   Weight up 8 pounds since 05/2019. Up 20 pounds in the past one year. States she is out of her aldactone. Feels cramps in her legs and belly. Wants her potassium checked. No abdominal pain. No nausea or vomiting. Takes lactulose for constipation. Good results when she takes it. Tries have a bowel movement at least daily. No melena or rectal bleeding. Has an appointment at [facility] on Thursday.  Current Outpatient Medications  Medication Sig Dispense Refill  . albuterol (PROVENTIL HFA;VENTOLIN HFA) 108 (90 Base) MCG/ACT inhaler Inhale 1-2 puffs into the lungs every 6 (six) hours as needed for wheezing or shortness of breath. 1 Inhaler 0  . DIFFERIN 0.1 % cream Apply 1 application  topically at bedtime.    Marland Kitchen FLUoxetine (PROZAC) 20 MG capsule Take 20 mg by mouth daily.    . furosemide (LASIX) 40 MG tablet Take 1 tablet by mouth once daily (Patient taking differently: Take 40 mg by mouth daily. ) 30 tablet 5  . hydrOXYzine (ATARAX/VISTARIL) 25 MG tablet Take 25 mg by mouth every 6 (six) hours as needed.    . lactulose (CONSTULOSE) 10 GM/15ML solution TAKE 30 MLS BY MOUTH TWICE DAILY AS NEEDED FOR MILD CONSTIPATION OR MODERATE CONSTIPATION. (Patient taking differently: Take 30 g by mouth 2 (two) times daily as needed for mild constipation or moderate constipation. ) 1892 mL 5  . propranolol (INDERAL) 20 MG tablet Take 1 tablet (20 mg total) by mouth 2 (two) times daily. 60 tablet 5  . traZODone (DESYREL) 50 MG tablet Take 50 mg by mouth at bedtime.     No current facility-administered medications for this visit.    Allergies as of 12/09/2019  . (No Known Allergies)    ROS:  General: Negative for anorexia, weight loss, fever, chills, fatigue, weakness. ENT: Negative for hoarseness, difficulty swallowing , nasal congestion. CV: Negative for chest pain, angina, palpitations, dyspnea on exertion, positive peripheral edema maintained with diuretic.  Respiratory: Negative for dyspnea at rest, dyspnea on exertion, cough, sputum, wheezing.  GI: See history of present illness. GU:  Negative for dysuria, hematuria, urinary incontinence, urinary frequency, nocturnal urination.  Endo: Negative for unusual weight change.    Physical Examination:   BP 124/87   Pulse  78   Ht _0  (1.6 m)   Wt 256 lb 9.6 oz (116.4 kg)   LMP 09/12/2017 (Exact Date) Comment:    BMI 45.45 kg/m   General: Well-nourished, well-developed in no acute distress.  Eyes: No icterus. Mouth: masked Lungs: Clear to auscultation bilaterally.  Heart: Regular rate and rhythm, no murmurs rubs or gallops.  Abdomen: Bowel sounds are normal, nontender, nondistended, no hepatosplenomegaly or masses, no  abdominal bruits or hernia , no rebound or guarding.   Extremities: No lower extremity edema. No clubbing or deformities. Neuro: Alert and oriented x 4   Skin: Warm and dry, no jaundice.   Psych: Alert and cooperative, normal mood and affect.  Labs:  Lab Results  Component Value Date   CREATININE 0.89 11/08/2019   BUN 12 11/08/2019   NA 141 11/08/2019   K 3.5 11/08/2019   CL 101 11/08/2019   CO2 22 11/08/2019   Lab Results  Component Value Date   ALT 37 11/08/2019   AST 58 (H) 11/08/2019   ALKPHOS 103 11/08/2019   BILITOT 1.0 11/08/2019   Lab Results  Component Value Date   WBC 9.0 11/08/2019   HGB 13.7 11/08/2019   HCT 40.9 11/08/2019   MCV 91.3 11/08/2019   PLT 155 11/08/2019   Lab Results  Component Value Date   INR 1.1 12/05/2018   INR 1.1 08/27/2018   INR 1.1 12/25/2017    Imaging Studies: No results found.  Impression/plan:  Pleasant 49 year old female with history of cirrhosis due to alcohol abuse presenting for follow-up. Last EGD October 2018 showed 2 columns of grade 2 esophageal varices, one column Grade 3. Patient is on propanolol 20 mg 3 times daily. Due for hepatoma surveillance, patient agreeable to ultrasound. We will update labs. Encouraged ongoing alcohol cessation. Continue current medications including propanolol, furosemide, spironolactone. Return the office in 6 months.  Constipation. Continue lactulose.

## 2019-12-09 NOTE — Patient Instructions (Signed)
1. Labs and ultrasound as scheduled.  2. Continue current medications. I have sent in RX for another fluid pill, spironolactone. 3. Congratulations on your sobriety! 4. Return to the office in six months or call sooner if needed.

## 2019-12-15 ENCOUNTER — Other Ambulatory Visit: Payer: Self-pay | Admitting: Family

## 2019-12-15 DIAGNOSIS — Z1231 Encounter for screening mammogram for malignant neoplasm of breast: Secondary | ICD-10-CM

## 2019-12-17 ENCOUNTER — Other Ambulatory Visit: Payer: Self-pay

## 2019-12-17 ENCOUNTER — Other Ambulatory Visit (HOSPITAL_COMMUNITY)
Admission: RE | Admit: 2019-12-17 | Discharge: 2019-12-17 | Disposition: A | Discharge status: Home / Self Care | Payer: Medicaid Other | Source: Ambulatory Visit | Attending: Gastroenterology | Admitting: Gastroenterology

## 2019-12-17 ENCOUNTER — Ambulatory Visit (HOSPITAL_COMMUNITY)
Admission: RE | Admit: 2019-12-17 | Discharge: 2019-12-17 | Disposition: A | Discharge status: Home / Self Care | Payer: Medicaid Other | Source: Ambulatory Visit | Attending: Gastroenterology | Admitting: Gastroenterology

## 2019-12-17 DIAGNOSIS — K7030 Alcoholic cirrhosis of liver without ascites: Secondary | ICD-10-CM | POA: Diagnosis not present

## 2019-12-17 DIAGNOSIS — K5900 Constipation, unspecified: Secondary | ICD-10-CM

## 2019-12-17 LAB — CBC WITH DIFFERENTIAL/PLATELET
Abs Immature Granulocytes: 0.03 10*3/uL (ref 0.00–0.07)
Basophils Absolute: 0 10*3/uL (ref 0.0–0.1)
Basophils Relative: 0 %
Eosinophils Absolute: 0.1 10*3/uL (ref 0.0–0.5)
Eosinophils Relative: 1 %
HCT: 40.1 % (ref 36.0–46.0)
Hemoglobin: 13.1 g/dL (ref 12.0–15.0)
Immature Granulocytes: 0 %
Lymphocytes Relative: 24 %
Lymphs Abs: 2.3 10*3/uL (ref 0.7–4.0)
MCH: 29.7 pg (ref 26.0–34.0)
MCHC: 32.7 g/dL (ref 30.0–36.0)
MCV: 90.9 fL (ref 80.0–100.0)
Monocytes Absolute: 0.5 10*3/uL (ref 0.1–1.0)
Monocytes Relative: 5 %
Neutro Abs: 6.7 10*3/uL (ref 1.7–7.7)
Neutrophils Relative %: 70 %
Platelets: 132 10*3/uL — ABNORMAL LOW (ref 150–400)
RBC: 4.41 MIL/uL (ref 3.87–5.11)
RDW: 13.7 % (ref 11.5–15.5)
WBC: 9.7 10*3/uL (ref 4.0–10.5)
nRBC: 0 % (ref 0.0–0.2)

## 2019-12-17 LAB — COMPREHENSIVE METABOLIC PANEL
ALT: 34 U/L (ref 0–44)
AST: 38 U/L (ref 15–41)
Albumin: 3.5 g/dL (ref 3.5–5.0)
Alkaline Phosphatase: 82 U/L (ref 38–126)
Anion gap: 11 (ref 5–15)
BUN: 15 mg/dL (ref 6–20)
CO2: 27 mmol/L (ref 22–32)
Calcium: 9.2 mg/dL (ref 8.9–10.3)
Chloride: 99 mmol/L (ref 98–111)
Creatinine, Ser: 0.8 mg/dL (ref 0.44–1.00)
GFR, Estimated: >60 mL/min (ref >60)
Glucose, Bld: 116 mg/dL — ABNORMAL HIGH (ref 70–99)
Potassium: 3.9 mmol/L (ref 3.5–5.1)
Sodium: 137 mmol/L (ref 135–145)
Total Bilirubin: 1 mg/dL (ref 0.3–1.2)
Total Protein: 8.2 g/dL — ABNORMAL HIGH (ref 6.5–8.1)

## 2019-12-17 LAB — PROTIME-INR
INR: 1.1 (ref 0.8–1.2)
Prothrombin Time: 13.8 seconds (ref 11.4–15.2)

## 2020-02-02 ENCOUNTER — Other Ambulatory Visit: Payer: Self-pay | Admitting: Gastroenterology

## 2020-04-08 ENCOUNTER — Other Ambulatory Visit: Payer: Self-pay | Admitting: Gastroenterology

## 2020-05-26 ENCOUNTER — Encounter: Payer: Self-pay | Admitting: Internal Medicine

## 2020-05-26 ENCOUNTER — Telehealth: Payer: Self-pay | Admitting: Internal Medicine

## 2020-05-26 NOTE — Telephone Encounter (Signed)
Nurse or office

## 2020-05-26 NOTE — Telephone Encounter (Signed)
OV due to BMI

## 2020-06-08 ENCOUNTER — Ambulatory Visit: Payer: Medicaid Other | Admitting: Nurse Practitioner

## 2020-06-08 NOTE — Progress Notes (Deleted)
Referring Provider: Jerel Shepherd, FNP Primary Care Physician:  Jerel Shepherd, FNP Primary GI:  Dr. Gala Romney  No chief complaint on file.   HPI:   Whitney Bush is a 50 y.o. female who presents for follow-up on cirrhosis.  The patient was last seen in our office 11/91/4782 for alcoholic cirrhosis and constipation.  Also noted history of IDA in the setting of heavy menses, underwent hysterectomy August 2018.  EGD most recently completed October 2018 with 2 columns of grade 2 esophageal varices, 1, grade 3 varices, mild portal hypertensive gastropathy with biopsies showing reactive gastropathy without H. pylori.  Esophagus dilated to 45 [place].  Colonoscopy in May 2017 with inadequate bowel prep but a single 6 mm polyp in the splenic flexure was found to be tubular adenoma and recommended 5-year repeat (2022).  Prior to her last visit was seen in ED on 11/08/2019 for intoxication and homicidal ideation with alcohol level of 364.  CMP found AST/ALT of 58/37, total bilirubin 1.0, alk phos 103, albumin 4.1.  Required involuntary commitment after behavioral medicine evaluation and spent 2 weeks at old [facility].  Notes no alcohol since 11/08/2019.  At her last visit noted weight up 8 pounds since April 2021 and 20 pounds in the last year.  She is out of Aldactone, notes cramps in her legs and abdomen and wants potassium checked.  Takes lactulose for constipation with good results.  At times a bowel movement at least once a day.  No melena or rectal bleeding.  Has upcoming appointment at [facility].  Recommended updated labs and ultrasound, continue current medications, refill spironolactone, follow-up in 6 months.  Labs are completed 12/17/2019 with a CBC essentially normal other than mild suppression of platelets at 132 (within recent baseline), INR normal at 1.1, CMP with normal LFTs, normal albumin.   Right upper quadrant ultrasound completed 12/17/2019 which found consistent with  cirrhosis, small left liver cyst, patent main portal vein, no ascites, cholelithiasis without acute cholecystitis.  Today she states she is doing okay overall.  Past Medical History:  Diagnosis Date  . Alcohol abuse   . 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 Anemia   . Blood transfusion without reported diagnosis   . Boil 06/04/2015  . Chronic kidney disease   . Clotting disorder ([place])   . COPD (chronic obstructive pulmonary disease) ([place])   . Depression   . GERD (gastroesophageal reflux disease)   . GI (gastrointestinal bleed) 07/28/2015  . Heart disease   . Heart murmur   . Hyperlipidemia   . Hypertension   . Hypothyroidism   . Migraines   . OCD (obsessive compulsive disorder)   . Peripheral edema   . PTSD (post-traumatic stress disorder)   . Tubular adenoma     Past Surgical History:  Procedure Laterality Date  . ABCESS DRAINAGE     x5; neck, arm, chest, back  . BIOPSY  02/24/2015   Procedure: BIOPSY;  Surgeon: Danie Binder, MD;  [facility];  Service: Endoscopy;;  gastric bx's  . BIOPSY  12/25/2016   Procedure: BIOPSY;  Surgeon: Daneil Dolin, MD;  [facility];  Service: Endoscopy;;  gastric  . CESAREAN SECTION    . COLONOSCOPY WITH PROPOFOL N/A 06/28/2015   Dr.Rourk- inadequate prep- One 6 mm polyp at the splenic flexure bx= tubular adenoma  . ESOPHAGEAL BANDING N/A 12/25/2016   Procedure: ESOPHAGEAL BANDING  with Propofol;  Surgeon: Daneil Dolin, MD;  Location[facility];  Service: Endoscopy;  Laterality: N/A;  possible banding of varices  . ESOPHAGOGASTRODUODENOSCOPY (EGD) WITH PROPOFOL N/A 02/24/2015   SLF: Grade II esophageal varices Moderate portal hypertensive gastropathy Mild erosive gastritis anemia likely due to many factors: gastritis, gastropathy, coagulopathy, chronic disease  . ESOPHAGOGASTRODUODENOSCOPY (EGD) WITH PROPOFOL N/A 12/25/2016   Rourk: 2 columns of grade 2 esophageal varices, 1 column  of grade 3.  Mild portal hypertensive gastropathy, biopsy showing reactive gastropathy but no H. pylori.  Esophagus dilated to 34 [place]  . KNEE ARTHROSCOPY WITH MEDIAL MENISECTOMY Left 01/09/2019   Procedure: KNEE ARTHROSCOPY WITH MEDIAL MENISCECTOMY AND LATERAL MENISCECTOMY;  Surgeon: Carole Civil, MD;  Location: [facility];  Service: Orthopedics;  Laterality: Left;  Marland Kitchen MALONEY DILATION N/A 12/25/2016   Procedure: Venia Minks DILATION;  Surgeon: Daneil Dolin, MD;  [facility];  Service: Endoscopy;  Laterality: N/A;  . POLYPECTOMY  06/28/2015   Procedure: POLYPECTOMY;  Surgeon: Daneil Dolin, MD;  [facility];  Service: Endoscopy;;  at splenic flexure  . SUPRACERVICAL ABDOMINAL HYSTERECTOMY N/A 10/02/2017   Procedure: HYSTERECTOMY SUPRACERVICAL ABDOMINAL;  Surgeon: Jonnie Kind, MD;  Location: [facility];  Service: Gynecology;  Laterality: N/A;    Current Outpatient Medications  Medication Sig Dispense Refill  . albuterol (PROVENTIL HFA;VENTOLIN HFA) 108 (90 Base) MCG/ACT inhaler Inhale 1-2 puffs into the lungs every 6 (six) hours as needed for wheezing or shortness of breath. 1 Inhaler 0  . DIFFERIN 0.1 % cream Apply 1 application topically at bedtime.    Marland Kitchen FLUoxetine (PROZAC) 20 MG capsule Take 20 mg by mouth daily.    . furosemide (LASIX) 40 MG tablet Take 1 tablet by mouth once daily (Patient taking differently: Take 40 mg by mouth daily. ) 30 tablet 5  . hydrOXYzine (ATARAX/VISTARIL) 25 MG tablet Take 25 mg by mouth every 6 (six) hours as needed.    . lactulose (CONSTULOSE) 10 GM/15ML solution TAKE 30 MLS BY MOUTH TWICE DAILY AS NEEDED FOR MILD OR MODERATE CONSTIPATION. 1659 mL 2  . propranolol (INDERAL) 20 MG tablet Take 1 tablet (20 mg total) by mouth 2 (two) times daily. 60 tablet 5  . spironolactone (ALDACTONE) 50 MG tablet Take 1 tablet (50 mg total) by mouth daily. 30 tablet 5  . traZODone (DESYREL) 50 MG tablet Take 50 mg by mouth at bedtime.     No current  facility-administered medications for this visit.    Allergies as of 06/08/2020  . (No Known Allergies)    Family History  Problem Relation Age of Onset  . Diabetes Father   . Hyperlipidemia Father   . Alcohol abuse Father   . Hypertension Father   . Cancer Other   . Cervical cancer Maternal Grandmother   . Lung cancer Maternal Grandfather   . Alcohol abuse Other        multiple family members  . Diabetes Sister   . Hypertension Sister   . Hypertension Brother   . Colon cancer Neg Hx   . Liver disease Neg Hx     Social History   Socioeconomic History  . Marital status: Widowed    Spouse name: Not on file  . Number of children: 1  . Years of education: 62  . Highest education level: Not on file  Occupational History  . Occupation: unemployed  Tobacco Use  . Smoking status: Former Smoker    Packs/day: 1.00  Years: 5.00    Pack years: 5.00    Types: Cigarettes    Quit date: 12/29/2010    Years since quitting: 9.4  . Smokeless tobacco: Former Systems developer    Types: Snuff    Quit date: 09/28/2010  Vaping Use  . Vaping Use: Never used  Substance and Sexual Activity  . Alcohol use: Yes    Comment: Patient is intoxicated on 11/08/2019  . Drug use: No  . Sexual activity: Yes    Birth control/protection: Surgical  Other Topics Concern  . Not on file  Social History Narrative   Lives with parents   Social Determinants of Health   Financial Resource Strain: Not on file  Food Insecurity: Not on file  Transportation Needs: Not on file  Physical Activity: Not on file  Stress: Not on file  Social Connections: Not on file    Subjective:*** Review of Systems  Constitutional: Negative for chills, fever, malaise/fatigue and weight loss.  HENT: Negative for congestion and sore throat.   Respiratory: Negative for cough and shortness of breath.   Cardiovascular: Negative for chest pain and palpitations.  Gastrointestinal: Negative for abdominal pain, blood in stool,  diarrhea, melena, nausea and vomiting.  Musculoskeletal: Negative for joint pain and myalgias.  Skin: Negative for rash.  Neurological: Negative for dizziness and weakness.  Endo/Heme/Allergies: Does not bruise/bleed easily.  Psychiatric/Behavioral: Negative for depression. The patient is not nervous/anxious.   All other systems reviewed and are negative.    Objective: LMP 09/12/2017 (Exact Date) Comment:   Physical Exam Vitals and nursing note reviewed.  Constitutional:      General: She is not in acute distress.    Appearance: Normal appearance. She is well-developed. She is not ill-appearing, toxic-appearing or diaphoretic.  HENT:     Head: Normocephalic and atraumatic.     Nose: No congestion or rhinorrhea.  Eyes:     General: No scleral icterus. Cardiovascular:     Rate and Rhythm: Normal rate and regular rhythm.     Heart sounds: Normal heart sounds.  Pulmonary:     Effort: Pulmonary effort is normal. No respiratory distress.     Breath sounds: Normal breath sounds.  Abdominal:     General: Bowel sounds are normal.     Palpations: Abdomen is soft. There is no hepatomegaly, splenomegaly or mass.     Tenderness: There is no abdominal tenderness. There is no guarding or rebound.     Hernia: No hernia is present.  Skin:    General: Skin is warm and dry.     Coloration: Skin is not jaundiced.     Findings: No rash.  Neurological:     General: No focal deficit present.     Mental Status: She is alert and oriented to person, place, and time.  Psychiatric:        Attention and Perception: Attention normal.        Mood and Affect: Mood normal.        Speech: Speech normal.        Behavior: Behavior normal.        Thought Content: Thought content normal.        Cognition and Memory: Cognition and memory normal.      Assessment:  ***   Plan: ***    Thank you for allowing [place] to participate in the care of [place], DNP, AGNP-C Adult &  Gerontological Nurse Practitioner [facility]   06/08/2020 9:21 AM  Disclaimer: This note was dictated with voice recognition software. Similar sounding words can inadvertently be transcribed and may not be corrected upon review.

## 2020-07-22 NOTE — Telephone Encounter (Signed)
error 

## 2020-07-29 ENCOUNTER — Telehealth: Payer: Self-pay | Admitting: Internal Medicine

## 2020-07-29 NOTE — Telephone Encounter (Signed)
Pt called to make another appointment that she missed. She is aware of OV on 8/2. She is asking for a prescription to be called into [place] to make her bowels move. Please advise. [phone]

## 2020-07-30 MED ORDER — LINACLOTIDE 145 MCG PO CAPS: 145.0000 ug | ORAL_CAPSULE | Freq: Every day | ORAL | 3 refills | Status: DC

## 2020-07-30 NOTE — Telephone Encounter (Signed)
rx for linzess sent. Ov as planned.

## 2020-07-30 NOTE — Telephone Encounter (Signed)
Returned the pt's call and was advised that she hasn't had a good BM in 2 weeks ( its been very little ) and she states when she turns to the right her side hurts. Pt wants Rx phoned in to [facility] / [place]

## 2020-08-03 NOTE — Telephone Encounter (Signed)
Phoned and spoke with the pt she was aware of her Rx (picked up over the weekend). And reminded her of the ov in August

## 2020-08-06 ENCOUNTER — Telehealth: Payer: Self-pay | Admitting: Internal Medicine

## 2020-08-06 ENCOUNTER — Telehealth: Payer: Self-pay

## 2020-08-06 NOTE — Telephone Encounter (Signed)
Pt returning call. [phone]

## 2020-08-06 NOTE — Telephone Encounter (Signed)
Returned the pt's call and LMOVM to call back

## 2020-08-09 ENCOUNTER — Telehealth: Payer: Self-pay | Admitting: Internal Medicine

## 2020-08-09 MED ORDER — LACTULOSE 10 GM/15ML PO SOLN: ORAL | 2 refills | Status: DC

## 2020-08-09 NOTE — Telephone Encounter (Signed)
Spoke to pt.  She is aware that she can resume lactulose.  Requested [place] to send RX to [place].

## 2020-08-09 NOTE — Telephone Encounter (Signed)
Already handled by Janace Hoard

## 2020-08-09 NOTE — Telephone Encounter (Signed)
Spoke to pt.  She said that Linzess is not helping her constipation and doesn't want to take it anymore.  She said that only thing it helped was cramping in her side.  She wants to know if she can go back to taking lactulose.  Had bm early this morning.  Went 2 days without having bm.  No n/v, fever, constipation, or rectal bleeding.  Routing to Roseanne Kaufman, NP in Neil Crouch, PA-C's absence.

## 2020-08-09 NOTE — Telephone Encounter (Signed)
Yes, that is fine. She can resume lactulose.

## 2020-08-09 NOTE — Telephone Encounter (Signed)
Completed.

## 2020-08-09 NOTE — Telephone Encounter (Signed)
Pt called asking for something to make her bowels move. She uses [employer].

## 2020-08-09 NOTE — Addendum Note (Signed)
Addended by: Annitta Needs on: 08/09/2020 03:34 PM   Modules accepted: Orders

## 2020-09-08 ENCOUNTER — Ambulatory Visit: Payer: Medicaid Other | Admitting: Gastroenterology

## 2020-09-28 ENCOUNTER — Encounter: Payer: Self-pay | Admitting: Internal Medicine

## 2020-09-28 ENCOUNTER — Ambulatory Visit: Payer: Medicaid Other | Admitting: Gastroenterology

## 2020-10-27 ENCOUNTER — Other Ambulatory Visit: Payer: Self-pay | Admitting: Gastroenterology

## 2021-01-04 ENCOUNTER — Other Ambulatory Visit: Payer: Self-pay | Admitting: Gastroenterology

## 2021-01-11 ENCOUNTER — Telehealth: Payer: Self-pay

## 2021-01-11 MED ORDER — PROPRANOLOL HCL 20 MG PO TABS: 20.0000 mg | ORAL_TABLET | Freq: Two times a day (BID) | ORAL | 2 refills | Status: DC

## 2021-01-11 MED ORDER — FUROSEMIDE 40 MG PO TABS: 40.0000 mg | ORAL_TABLET | Freq: Every day | ORAL | 0 refills | Status: DC

## 2021-01-11 NOTE — Telephone Encounter (Signed)
Pt is requesting refills on propanolol and furosemide. Both medications were filled by [place] last. Pt was last seen in office by Neil Crouch on 12/09/2019. Pt is requesting that they be sent to [facility] in [place].

## 2021-01-11 NOTE — Telephone Encounter (Signed)
Pt was made aware and verbalized understanding.  

## 2021-01-11 NOTE — Addendum Note (Signed)
Addended by: Mahala Menghini on: 01/11/2021 11:48 AM   Modules accepted: Orders

## 2021-01-11 NOTE — Telephone Encounter (Signed)
Rx completed. Patient needs follow up ov.

## 2021-01-17 ENCOUNTER — Encounter (HOSPITAL_COMMUNITY): Payer: Self-pay | Admitting: Emergency Medicine

## 2021-01-17 ENCOUNTER — Emergency Department (HOSPITAL_COMMUNITY)
Admission: EM | Admit: 2021-01-17 | Discharge: 2021-01-17 | Disposition: A | Discharge status: Home / Self Care | Payer: Medicaid Other | Attending: Emergency Medicine | Admitting: Emergency Medicine

## 2021-01-17 ENCOUNTER — Other Ambulatory Visit: Payer: Self-pay

## 2021-01-17 DIAGNOSIS — Z87891 Personal history of nicotine dependence: Secondary | ICD-10-CM | POA: Diagnosis not present

## 2021-01-17 DIAGNOSIS — N189 Chronic kidney disease, unspecified: Secondary | ICD-10-CM | POA: Diagnosis not present

## 2021-01-17 DIAGNOSIS — J449 Chronic obstructive pulmonary disease, unspecified: Secondary | ICD-10-CM | POA: Diagnosis not present

## 2021-01-17 DIAGNOSIS — L02415 Cutaneous abscess of right lower limb: Secondary | ICD-10-CM | POA: Insufficient documentation

## 2021-01-17 DIAGNOSIS — L732 Hidradenitis suppurativa: Secondary | ICD-10-CM | POA: Insufficient documentation

## 2021-01-17 DIAGNOSIS — Z7951 Long term (current) use of inhaled steroids: Secondary | ICD-10-CM | POA: Diagnosis not present

## 2021-01-17 DIAGNOSIS — E039 Hypothyroidism, unspecified: Secondary | ICD-10-CM | POA: Insufficient documentation

## 2021-01-17 DIAGNOSIS — I129 Hypertensive chronic kidney disease with stage 1 through stage 4 chronic kidney disease, or unspecified chronic kidney disease: Secondary | ICD-10-CM | POA: Diagnosis not present

## 2021-01-17 DIAGNOSIS — Z79899 Other long term (current) drug therapy: Secondary | ICD-10-CM | POA: Diagnosis not present

## 2021-01-17 MED ORDER — CEPHALEXIN 500 MG PO CAPS: 500.0000 mg | ORAL_CAPSULE | Freq: Four times a day (QID) | ORAL | 0 refills | Status: DC

## 2021-01-17 MED ORDER — LIDOCAINE-EPINEPHRINE-TETRACAINE (LET) TOPICAL GEL
3.0000 mL | Freq: Once | TOPICAL | Status: AC
  Administered 2021-01-17: 3 mL via TOPICAL
  Filled 2021-01-17: qty 3

## 2021-01-17 NOTE — ED Triage Notes (Signed)
Abscess right inner thigh, several in groin area and under both breast. Pt states she has had these for year and they are getting worse. Abscess under right breast is draining.  Pt states right inner thigh is painful and needs to be drained

## 2021-01-17 NOTE — ED Provider Notes (Signed)
[facility] EMERGENCY DEPARTMENT Provider Note   CSN: [id number] Arrival date & time: 01/17/21  1051     History Chief Complaint  Patient presents with   Abscess    Whitney Bush is a 50 y.o. female with history of hidradenitis suppurativa presents to the emergency department for evaluation of an inner right thigh abscess that she has had for the past 3 to 4 months that is go away and then come back then.  Additionally, she is having draining abscesses under her bilateral breast.  She reports she sees dermatologist for this issue and they give her topical Differin gel which does not help.  Medical history includes hypertension, cirrhosis, possible diabetes?.  Surgical history nonpertinent.  Patient reports she is out of her medications other than for her blood pressure pills because she missed her primary care appointment.  No known drug allergies.  Denies any tobacco, EtOH, or drug use.   Abscess Associated symptoms: no fever and no vomiting       Past Medical History:  Diagnosis Date   Alcohol abuse    Alcoholic cirrhosis of liver (HCC)    immune to Hep A and Hep B   Anemia    Blood transfusion without reported diagnosis    Boil 06/04/2015   Chronic kidney disease    Clotting disorder (HCC)    COPD (chronic obstructive pulmonary disease) (HCC)    Depression    GERD (gastroesophageal reflux disease)    GI (gastrointestinal bleed) 07/28/2015   Heart disease    Heart murmur    Hyperlipidemia    Hypertension    Hypothyroidism    Migraines    OCD (obsessive compulsive disorder)    Peripheral edema    PTSD (post-traumatic stress disorder)    Tubular adenoma     Patient Active Problem List   Diagnosis Date Noted   Constipation 12/09/2019   Cholelithiasis 06/09/2019   S/P left knee arthroscopy 01/09/2019 01/16/2019   Acute medial meniscus tear of left knee    Old peripheral tear of lateral meniscus of left knee    Left anterior cruciate ligament tear    LUQ pain  03/19/2018   Status post abdominal supracervical subtotal hysterectomy 10/02/2017   Anemia due to chronic blood loss 06/07/2017   Symptomatic anemia 06/03/2017   Abnormal uterine bleeding (AUB) 02/12/2017   Acute blood loss anemia 11/27/2016   Thickened endometrium 11/23/2016   Uterine leiomyoma 11/23/2016   Menorrhagia with regular cycle 98/92/1194   Alcoholic cirrhosis of liver ([place]) 07/07/2016   Esophageal dysphagia 07/07/2016   Esophageal varices without bleeding ([place]) 07/07/2016   IDA (iron deficiency anemia) 06/09/2016   PTSD (post-traumatic stress disorder) 04/20/2016   OCD (obsessive compulsive disorder) 04/20/2016   Severe recurrent major depression without psychotic features ([place]) 04/19/2016   GERD (gastroesophageal reflux disease) 03/09/2016   Depression 02/07/2016   Bilateral edema of lower extremity    History of colonic polyps    Alcoholic cirrhosis of liver with ascites ([place])    Anasarca 17/40/8144   Alcoholic hepatitis with ascites    Ascites    Bleeding gastrointestinal    AKI (acute kidney injury) ([place]) 12/10/2014   Volume overload 81/85/6314   Alcoholic cirrhosis (HCC)    Elevated bilirubin    Nausea with vomiting    Diarrhea    Absolute anemia 12/02/2014   Jaundice 12/02/2014   Essential hypertension 12/02/2014   Alcohol use disorder, moderate, dependence ([place]) 12/02/2014   Abdominal pain 12/02/2014    Past  Surgical History:  Procedure Laterality Date   ABCESS DRAINAGE     x5; neck, arm, chest, back   BIOPSY  02/24/2015   Procedure: BIOPSY;  Surgeon: Danie Binder, MD;  Location: [facility];  Service: Endoscopy;;  gastric bx's   BIOPSY  12/25/2016   Procedure: BIOPSY;  Surgeon: Daneil Dolin, MD;  Location: [facility];  Service: Endoscopy;;  gastric   CESAREAN SECTION     COLONOSCOPY WITH PROPOFOL N/A 06/28/2015   Dr.Rourk- inadequate prep- One 6 mm polyp at the splenic flexure bx= tubular adenoma   ESOPHAGEAL BANDING N/A 12/25/2016    Procedure: ESOPHAGEAL BANDING with Propofol;  Surgeon: Daneil Dolin, MD;  Location: [facility];  Service: Endoscopy;  Laterality: N/A;  possible banding of varices   ESOPHAGOGASTRODUODENOSCOPY (EGD) WITH PROPOFOL N/A 02/24/2015   SLF: Grade II esophageal varices Moderate portal hypertensive gastropathy Mild erosive gastritis anemia likely due to many factors: gastritis, gastropathy, coagulopathy, chronic disease   ESOPHAGOGASTRODUODENOSCOPY (EGD) WITH PROPOFOL N/A 12/25/2016   Rourk: 2 columns of grade 2 esophageal varices, 1 column of grade 3.  Mild portal hypertensive gastropathy, biopsy showing reactive gastropathy but no H. pylori.  Esophagus dilated to 52 [place]   KNEE ARTHROSCOPY WITH MEDIAL MENISECTOMY Left 01/09/2019   Procedure: KNEE ARTHROSCOPY WITH MEDIAL MENISCECTOMY AND LATERAL MENISCECTOMY;  Surgeon: Carole Civil, MD;  Location: [facility];  Service: Orthopedics;  Laterality: Left;   MALONEY DILATION N/A 12/25/2016   Procedure: Venia Minks DILATION;  Surgeon: Daneil Dolin, MD;  Location: [facility];  Service: Endoscopy;  Laterality: N/A;   POLYPECTOMY  06/28/2015   Procedure: POLYPECTOMY;  Surgeon: Daneil Dolin, MD;  [facility];  Service: Endoscopy;;  at splenic flexure   SUPRACERVICAL ABDOMINAL HYSTERECTOMY N/A 10/02/2017   Procedure: HYSTERECTOMY SUPRACERVICAL ABDOMINAL;  Surgeon: Jonnie Kind, MD;  Location: [facility];  Service: Gynecology;  Laterality: N/A;     OB History     Gravida  2   Para  1   Term  1   Preterm      AB  1   Living  1      SAB      IAB      Ectopic      Multiple      Live Births  1           Family History  Problem Relation Age of Onset   Diabetes Father    Hyperlipidemia Father    Alcohol abuse Father    Hypertension Father    Cancer Other    Cervical cancer Maternal Grandmother    Lung cancer Maternal Grandfather    Alcohol abuse Other        multiple family members   Diabetes Sister     Hypertension Sister    Hypertension Brother    Colon cancer Neg Hx    Liver disease Neg Hx     Social History   Tobacco Use   Smoking status: Former    Packs/day: 1.00    Years: 5.00    Pack years: 5.00    Types: Cigarettes    Quit date: 12/29/2010    Years since quitting: 10.0   Smokeless tobacco: Former    Types: Snuff    Quit date: 09/28/2010  Vaping Use   Vaping Use: Never used  Substance Use Topics   Alcohol use: Yes    Comment: socially   Drug use: No  Home Medications Prior to Admission medications   Medication Sig Start Date End Date Taking? Authorizing Provider  cephALEXin (KEFLEX) 500 MG capsule Take 1 capsule (500 mg total) by mouth 4 (four) times daily. 01/17/21  Yes Sherrell Puller, PA-C  albuterol (PROVENTIL HFA;VENTOLIN HFA) 108 (90 Base) MCG/ACT inhaler Inhale 1-2 puffs into the lungs every 6 (six) hours as needed for wheezing or shortness of breath. 03/19/18   Mahala Menghini, PA-C  DIFFERIN 0.1 % cream Apply 1 application topically at bedtime. 10/24/19   [provider]  FLUoxetine (PROZAC) 20 MG capsule Take 20 mg by mouth daily.    [provider]  furosemide (LASIX) 40 MG tablet Take 1 tablet (40 mg total) by mouth daily. 01/11/21   Mahala Menghini, PA-C  hydrOXYzine (ATARAX/VISTARIL) 25 MG tablet Take 25 mg by mouth every 6 (six) hours as needed.    [provider]  lactulose (CONSTULOSE) 10 GM/15ML solution TAKE 30 MLS BY MOUTH TWICE DAILY AS NEEDED FOR MILD OR MODERATE CONSTIPATION. 08/09/20   Annitta Needs, NP  linaclotide [facility]) 145 MCG CAPS capsule Take 1 capsule (145 mcg total) by mouth daily before breakfast. 07/30/20   Mahala Menghini, PA-C  propranolol (INDERAL) 20 MG tablet Take 1 tablet (20 mg total) by mouth 2 (two) times daily. 01/11/21   Mahala Menghini, PA-C  spironolactone (ALDACTONE) 50 MG tablet Take 1 tablet by mouth once daily 10/28/20   Annitta Needs, NP  traZODone (DESYREL) 50 MG tablet Take 50 mg by mouth at  bedtime.    [provider]    Allergies    Patient has no known allergies.  Review of Systems   Review of Systems  Constitutional:  Negative for chills and fever.  HENT:  Negative for ear pain and sore throat.   Eyes:  Negative for pain and visual disturbance.  Respiratory:  Negative for cough and shortness of breath.   Cardiovascular:  Negative for chest pain and palpitations.  Gastrointestinal:  Negative for abdominal pain and vomiting.  Genitourinary:  Negative for dysuria and hematuria.  Musculoskeletal:  Negative for arthralgias and back pain.  Skin:  Positive for wound. Negative for color change and rash.  Neurological:  Negative for seizures and syncope.  All other systems reviewed and are negative.  Physical Exam Updated Vital Signs BP (!) 140/95 (BP Location: Left Arm)   Pulse (!) 109   Temp 98.1 F (36.7 C) (Oral)   Resp 20   Ht 5\' 3"  (1.6 m)   Wt 132 kg   LMP 09/12/2017 (Exact Date) Comment:    SpO2 100%   BMI 51.55 kg/m   Physical Exam Vitals and nursing note reviewed.  Constitutional:      General: She is not in acute distress.    Appearance: Normal appearance. She is normal weight. She is not toxic-appearing.  Eyes:     General: No scleral icterus. Pulmonary:     Effort: Pulmonary effort is normal. No respiratory distress.  Skin:    Findings: Lesion present. No rash.     Comments: Multiple abscesses throughout body.  Patient has bilateral draining lesions under her breasts.  Additionally, patient has 2 draining lesions under her pannus.  No surrounding fluctuance or induration.  Serous discharge.  She has a approximately 6 x 2 cm soft area mass to her medial inner right thigh.  Mass is very soft to touch.  No fluctuance or induration noted.  No surrounding erythema.  No additional  warmth to touch.  Neurological:     General: No focal deficit present.     Mental Status: She is alert. Mental status is at baseline.  Psychiatric:        Mood and  Affect: Mood normal.       ED Results / Procedures / Treatments   Labs (all labs ordered are listed, but only abnormal results are displayed) Labs Reviewed - No data to display  EKG None  Radiology No results found.  Procedures .Marland KitchenIncision and Drainage  Date/Time: 01/17/2021 3:20 PM Performed by: Sherrell Puller, PA-C Authorized by: Sherrell Puller, PA-C   Consent:    Consent obtained:  Verbal   Consent given by:  Patient   Risks discussed:  Bleeding and incomplete drainage   Alternatives discussed:  No treatment Universal protocol:    Procedure explained and questions answered to patient or proxy's satisfaction: yes     Relevant documents present and verified: no     Test results available : no     Imaging studies available: no     Required blood products, implants, devices, and special equipment available: no     Site/side marked: no     Patient identity confirmed:  Verbally with patient Location:    Type:  Abscess   Size:  6x2cm   Location:  Lower extremity   Lower extremity location:  Leg   Leg location:  R upper leg Pre-procedure details:    Skin preparation:  Chlorhexidine with alcohol Sedation:    Sedation type:  None Anesthesia:    Anesthesia method:  Topical application   Topical anesthetic:  LET Procedure type:    Complexity:  Simple Procedure details:    Ultrasound guidance: no     Needle aspiration: yes     Needle size:  22 G   Drainage:  Bloody   Drainage amount:  Moderate   Wound treatment:  Wound left open   Packing materials:  None Post-procedure details:    Procedure completion:  Tolerated well, no immediate complications Comments:     After the aspiration, the wound was having some bloody discharge.  I tried to express more of the blood which responded well.  The mass has lessened in size and is almost flat against the skin.   Medications Ordered in ED Medications  lidocaine-EPINEPHrine-tetracaine (LET) topical gel (3 mLs Topical Given  01/17/21 1451)    ED Course  I have reviewed the triage vital signs and the nursing notes.  Pertinent labs & imaging results that were available during my care of the patient were reviewed by me and considered in my medical decision making (see chart for details).  50 year old female presents emergency department for evaluation of multiple abscesses.  Patient has a history of hydradenitis suppurativa.of  Patient reports her dermatologist told her to place Differin gel on the abscesses but she is not had any improvement. Was able to express a moderate amount of blood out of the mass and it has decreased in size. This is likely a hematoma from the patient trying to manage the abscess. I will place the patient on Keflex to prevent infection. I recommended that she continue placing warm compresses on the areas and to follow up with her dermatologist.  Recommended the patient follow-up with emergency department or her PCP for wound re-evaluation. Return precautions discussed. Patient agrees to plan. Patient is stable and being discharged home in good condition.     MDM Rules/Calculators/A&P  Final Clinical Impression(s) / ED Diagnoses Final diagnoses:  Hidradenitis suppurativa    Rx / DC Orders ED Discharge Orders          Ordered    cephALEXin (KEFLEX) 500 MG capsule  4 times daily        01/17/21 1528             Sherrell Puller, [place] 01/17/21 Ivey, Ankit, MD 01/18/21 0745

## 2021-01-17 NOTE — Discharge Instructions (Addendum)
You are seen here today for your hidradenitis suppurativa.  We were able to express some blood out of the one on your right thigh.  You are prescribed Keflex, an antibiotic, to prevent infection.  Please take as prescribed complete the entirety of the course to prevent reinfection.  Is important you follow-up with your dermatologist to express that the Differin gel is not working.  You may need to advance your care with this.  Please continue to apply warm compresses to the area.  Please come back to the emergency department or your primary care office for wound recheck in 3 days.  If you have any concern, new or worsening symptoms, please return the nearest emergency department for reevaluation.

## 2021-03-02 ENCOUNTER — Other Ambulatory Visit: Payer: Self-pay

## 2021-03-02 ENCOUNTER — Encounter: Payer: Self-pay | Admitting: Gastroenterology

## 2021-03-02 ENCOUNTER — Ambulatory Visit: Payer: Medicaid Other | Admitting: Gastroenterology

## 2021-03-02 VITALS — BP 128/86 | HR 95 | Temp 97.8°F | Ht 63.0 in | Wt 299.8 lb

## 2021-03-02 DIAGNOSIS — R1319 Other dysphagia: Secondary | ICD-10-CM

## 2021-03-02 DIAGNOSIS — I8510 Secondary esophageal varices without bleeding: Secondary | ICD-10-CM

## 2021-03-02 DIAGNOSIS — Z86010 Personal history of colonic polyps: Secondary | ICD-10-CM

## 2021-03-02 DIAGNOSIS — K7030 Alcoholic cirrhosis of liver without ascites: Secondary | ICD-10-CM

## 2021-03-02 MED ORDER — PROPRANOLOL HCL 20 MG PO TABS: 20.0000 mg | ORAL_TABLET | Freq: Two times a day (BID) | ORAL | 2 refills | Status: DC

## 2021-03-02 MED ORDER — LACTULOSE 10 GM/15ML PO SOLN: ORAL | 2 refills | Status: DC

## 2021-03-02 MED ORDER — FUROSEMIDE 40 MG PO TABS: 40.0000 mg | ORAL_TABLET | Freq: Every day | ORAL | 2 refills | Status: DC

## 2021-03-02 MED ORDER — SPIRONOLACTONE 50 MG PO TABS: 50.0000 mg | ORAL_TABLET | Freq: Every day | ORAL | 2 refills | Status: DC

## 2021-03-02 NOTE — Progress Notes (Signed)
Primary Care Physician: Jerel Shepherd, [place]  Primary Gastroenterologist:  Garfield Cornea, MD   Chief Complaint  Patient presents with   Cirrhosis   Abdominal Pain    Left/right sides and into lower back and down into hip, comes/goes. Left lower side cramping "pretty bad"   Diarrhea    Last week for 2-3 days. Went about 8 times a day   Nausea    A lot but very little vomiting    HPI: Whitney Bush is a 51 y.o. female here follow up. She has history of etoh cirrhosis. Last seen 11/2019. She also has a history of IDA in the setting of heavy menses, underwent hysterectomy in August 2019.EGD October 2018 showed 2 columns of grade 2 esophageal varices, 1 column of grade 3.  Mild portal hypertensive gastropathy, biopsy showing reactive gastropathy but no H. pylori. Esophagus dilated to 37 [place].  Colonoscopy May 2017, inadequate bowel prep.  One 6 mm polyp in the splenic flexure removed, tubular adenoma.  Recommended 5-year follow-up colonoscopy.  She is due for hepatoma screening. Last u/s in 11/2019. Due for labs as well.   States she was back in etoh rehab 6 months ago. States she has been sober since then. Complains of feeling confused/foggy at night. Having a hard time sleeping at night. Naps throughout the day. Appetite ok. No vomiting. Has bilateral abdominal pain which radiates into her back, crampy llq pain. Nausea. Diarrhea few days last week but better. Feels itching in the throat area, on left side. Feels like her esophagus needs stretching. Foods get stuck. BMs regular. No melena, brbpr.   Lab Results  Component Value Date   CREATININE 0.80 12/17/2019   BUN 15 12/17/2019   NA 137 12/17/2019   K 3.9 12/17/2019   CL 99 12/17/2019   CO2 27 12/17/2019   Lab Results  Component Value Date   ALT 34 12/17/2019   AST 38 12/17/2019   ALKPHOS 82 12/17/2019   BILITOT 1.0 12/17/2019   Lab Results  Component Value Date   WBC 9.7 12/17/2019   HGB 13.1 12/17/2019   HCT  40.1 12/17/2019   MCV 90.9 12/17/2019   PLT 132 (L) 12/17/2019   Lab Results  Component Value Date   INR 1.1 12/17/2019   INR 1.1 12/05/2018   INR 1.1 08/27/2018     Current Outpatient Medications  Medication Sig Dispense Refill   furosemide (LASIX) 40 MG tablet Take 1 tablet (40 mg total) by mouth daily. 90 tablet 0   hydrOXYzine (ATARAX/VISTARIL) 25 MG tablet Take 25 mg by mouth every 6 (six) hours as needed.     lactulose (CONSTULOSE) 10 GM/15ML solution TAKE 30 MLS BY MOUTH TWICE DAILY AS NEEDED FOR MILD OR MODERATE CONSTIPATION. 1659 mL 2   montelukast (SINGULAIR) 10 MG tablet Take 10 mg by mouth at bedtime.     spironolactone (ALDACTONE) 50 MG tablet Take 1 tablet by mouth once daily 30 tablet 3   propranolol (INDERAL) 20 MG tablet Take 1 tablet (20 mg total) by mouth 2 (two) times daily. (Patient not taking: Reported on 03/02/2021) 180 tablet 2   No current facility-administered medications for this visit.    Allergies as of 03/02/2021   (No Known Allergies)    ROS:  General: Negative for anorexia, weight loss, fever, chills, fatigue, +weakness. ENT: Negative for hoarseness, difficulty swallowing , nasal congestion. CV: Negative for chest pain, angina, palpitations, dyspnea on exertion, peripheral edema.  Respiratory: Negative  for dyspnea at rest, dyspnea on exertion, cough, sputum, wheezing.  GI: See history of present illness. GU:  Negative for dysuria, hematuria, urinary incontinence, urinary frequency, nocturnal urination.  Endo: Negative for unusual weight change.    Physical Examination:   BP 128/86    Pulse 95    Temp 97.8 F (36.6 C)    Ht 5\' 3"  (1.6 m)    Wt 299 lb 12.8 oz (136 kg)    LMP 09/12/2017 (Exact Date) Comment:     BMI 53.11 kg/m   General: Well-nourished, well-developed in no acute distress.  Eyes: No icterus. Mouth: masked Lungs: Clear to auscultation bilaterally.  Heart: Regular rate and rhythm, no murmurs rubs or gallops.  Abdomen: Bowel  sounds are normal, nontender, nondistended, no hepatosplenomegaly or masses, no abdominal bruits or hernia , no rebound or guarding.   Extremities: No lower extremity edema. No clubbing or deformities. Neuro: Alert and oriented x 4   Skin: Warm and dry, no jaundice.   Psych: Alert and cooperative, normal mood and affect.   Imaging Studies: No results found.   Assessment:  ETOH cirrhosis: struggles with etoh abuse. Last in rehab six months ago. States she has been sober since that time. Overdue for labs, hepatoma screening. Compliance has been an issue. Refilled her diuretics today. Continue propranolol for prophylaxis against esophageal variceal bleeding.   Esophageal dysphagia: similar symptoms that responded to esophageal dilation in 2018. She has history of esophageal varices. Recommend EGD. If a candidate, consider esophageal dilation. Patient understands that based on findings, she may not be able to have her esophagus stretched.   H/O adenomatous colon polyp: due for surveillance colonoscopy.   Abdominal pain: somewhat nonspecific. Labs and u/s planned.    Plan: Continue spironolactone, furosemide, propranolol, lactulose.  Complete labs.  Abdominal u/s for hepatoma screening.  Once labs are back, consider EGD to evaluate her dysphagia, consider dilation if possible. She is aware that depending on esophageal varices she may not be a candidate for dilation.  She is due for colonoscopy given h/o polyps, prior inadequate bowel prep.  Continue etoh cessation.

## 2021-03-02 NOTE — Patient Instructions (Addendum)
I have refilled your spironolactone, propranolol, furosemide, lactulose.  Please have labs completed today at [facility]. We will schedule abdominal ultrasound to follow up on your liver. Once labs are back, we will make arrangements for upper endoscopy to evaluate for varices (blood vessels in the esophagus that are a result of liver disease and can bleed) and your swallowing concerns.

## 2021-03-03 ENCOUNTER — Telehealth: Payer: Self-pay

## 2021-03-03 NOTE — Telephone Encounter (Signed)
Documentation from Gildardo Pounds, MSW, [facility] ( [facility]) from [place] wanting the Medication Reconciliation paperwork filled out and faxed back to her. It will be on your desk in the green folder.

## 2021-03-05 LAB — CBC WITH DIFFERENTIAL/PLATELET
Absolute Monocytes: 489 cells/uL (ref 200–950)
Basophils Absolute: 47 cells/uL (ref 0–200)
Basophils Relative: 0.5 %
Eosinophils Absolute: 85 cells/uL (ref 15–500)
Eosinophils Relative: 0.9 %
HCT: 38.3 % (ref 35.0–45.0)
Hemoglobin: 12.7 g/dL (ref 11.7–15.5)
Lymphs Abs: 2181 cells/uL (ref 850–3900)
MCH: 30.9 pg (ref 27.0–33.0)
MCHC: 33.2 g/dL (ref 32.0–36.0)
MCV: 93.2 fL (ref 80.0–100.0)
MPV: 10.6 fL (ref 7.5–12.5)
Monocytes Relative: 5.2 %
Neutro Abs: 6599 cells/uL (ref 1500–7800)
Neutrophils Relative %: 70.2 %
Platelets: 153 10*3/uL (ref 140–400)
RBC: 4.11 10*6/uL (ref 3.80–5.10)
RDW: 13.1 % (ref 11.0–15.0)
Total Lymphocyte: 23.2 %
WBC: 9.4 10*3/uL (ref 3.8–10.8)

## 2021-03-05 LAB — COMPREHENSIVE METABOLIC PANEL
AG Ratio: 0.8 (calc) — ABNORMAL LOW (ref 1.0–2.5)
ALT: 34 U/L — ABNORMAL HIGH (ref 6–29)
AST: 42 U/L — ABNORMAL HIGH (ref 10–35)
Albumin: 3.3 g/dL — ABNORMAL LOW (ref 3.6–5.1)
Alkaline phosphatase (APISO): 76 U/L (ref 31–125)
BUN: 9 mg/dL (ref 7–25)
CO2: 29 mmol/L (ref 20–32)
Calcium: 8.9 mg/dL (ref 8.6–10.2)
Chloride: 103 mmol/L (ref 98–110)
Creat: 0.86 mg/dL (ref 0.50–0.99)
Globulin: 4.3 g/dL (calc) — ABNORMAL HIGH (ref 1.9–3.7)
Glucose, Bld: 129 mg/dL (ref 65–139)
Potassium: 3.5 mmol/L (ref 3.5–5.3)
Sodium: 139 mmol/L (ref 135–146)
Total Bilirubin: 0.8 mg/dL (ref 0.2–1.2)
Total Protein: 7.6 g/dL (ref 6.1–8.1)

## 2021-03-05 LAB — PROTIME-INR
INR: 1.1
Prothrombin Time: 11.4 s (ref 9.0–11.5)

## 2021-03-05 LAB — TSH+FREE T4: TSH W/REFLEX TO FT4: 2.01 mIU/L

## 2021-03-05 LAB — AMMONIA: Ammonia: 70 umol/L (ref <=72)

## 2021-03-07 NOTE — Telephone Encounter (Signed)
Placed on Dena's desk.

## 2021-03-09 ENCOUNTER — Encounter: Payer: Self-pay | Admitting: Gastroenterology

## 2021-03-10 ENCOUNTER — Other Ambulatory Visit: Payer: Self-pay

## 2021-03-10 ENCOUNTER — Emergency Department (HOSPITAL_COMMUNITY)
Admission: EM | Admit: 2021-03-10 | Discharge: 2021-03-10 | Disposition: A | Discharge status: Home / Self Care | Payer: Medicaid Other | Attending: Emergency Medicine | Admitting: Emergency Medicine

## 2021-03-10 ENCOUNTER — Encounter (HOSPITAL_COMMUNITY): Payer: Self-pay | Admitting: *Deleted

## 2021-03-10 ENCOUNTER — Ambulatory Visit (HOSPITAL_COMMUNITY)
Admission: RE | Admit: 2021-03-10 | Discharge: 2021-03-10 | Disposition: A | Discharge status: Home / Self Care | Payer: Medicaid Other | Source: Ambulatory Visit | Attending: Gastroenterology | Admitting: Gastroenterology

## 2021-03-10 DIAGNOSIS — R1319 Other dysphagia: Secondary | ICD-10-CM | POA: Diagnosis present

## 2021-03-10 DIAGNOSIS — J449 Chronic obstructive pulmonary disease, unspecified: Secondary | ICD-10-CM | POA: Insufficient documentation

## 2021-03-10 DIAGNOSIS — K7030 Alcoholic cirrhosis of liver without ascites: Secondary | ICD-10-CM | POA: Insufficient documentation

## 2021-03-10 DIAGNOSIS — L732 Hidradenitis suppurativa: Secondary | ICD-10-CM | POA: Insufficient documentation

## 2021-03-10 DIAGNOSIS — N189 Chronic kidney disease, unspecified: Secondary | ICD-10-CM | POA: Insufficient documentation

## 2021-03-10 DIAGNOSIS — I129 Hypertensive chronic kidney disease with stage 1 through stage 4 chronic kidney disease, or unspecified chronic kidney disease: Secondary | ICD-10-CM | POA: Insufficient documentation

## 2021-03-10 DIAGNOSIS — Z79899 Other long term (current) drug therapy: Secondary | ICD-10-CM | POA: Insufficient documentation

## 2021-03-10 DIAGNOSIS — L0291 Cutaneous abscess, unspecified: Secondary | ICD-10-CM | POA: Diagnosis present

## 2021-03-10 MED ORDER — DOXYCYCLINE HYCLATE 100 MG PO TABS
100.0000 mg | ORAL_TABLET | Freq: Once | ORAL | Status: AC
  Administered 2021-03-10: 100 mg via ORAL
  Filled 2021-03-10: qty 1

## 2021-03-10 MED ORDER — DOXYCYCLINE HYCLATE 100 MG PO CAPS: 100.0000 mg | ORAL_CAPSULE | Freq: Two times a day (BID) | ORAL | 0 refills | Status: DC

## 2021-03-10 MED ORDER — HYDROCODONE-ACETAMINOPHEN 5-325 MG PO TABS: ORAL_TABLET | ORAL | 0 refills | Status: DC

## 2021-03-10 NOTE — ED Notes (Signed)
Pt d/c home per MD order. Discharge summary reviewed with pt, pt verbalizes understanding. Ambulatory off unit. No s/s of acute distress noted at discharge.  °

## 2021-03-10 NOTE — Discharge Instructions (Signed)
Take the antibiotic as directed until its finished.  Keep the affected areas clean and dry as possible.  You may use Hibiclens to clean the areas once every 10 days to 2 weeks.  You may buy this without a prescription at the pharmacy of your choice.  Keep your upcoming appointment with your dermatologist.  Return to the emergency department for any new or worsening symptoms.

## 2021-03-10 NOTE — ED Provider Notes (Signed)
[facility] EMERGENCY DEPARTMENT Provider Note   CSN: [id number] Arrival date & time: 03/10/21  1019     History  Chief Complaint  Patient presents with   Abscess    Whitney Bush is a 51 y.o. female.   Abscess Associated symptoms: no fever, no nausea and no vomiting        Whitney Bush is a 51 y.o. female with past medical history significant for anemia, hypertension, alcoholic cirrhosis of the liver, obesity chronic kidney disease, hidradenitis suppurativa and COPD who presents to the Emergency Department complaining of recurrent draining boils to bilateral inner thighs and underneath both breasts.  Areas have been painful and draining for 1 to 2 months.  She has been trying to keep the areas dry using towels placed in the skin folds.  She is seen a dermatologist in the past.  She reports temporary relief while taking doxycycline.  She has dermatology follow-up in February.  She denies fever, chills, abdominal pain, nausea or vomiting. She notes history of possible diabetes, not currently on antidiabetic medications.  Is followed by the health department for this.   Home Medications Prior to Admission medications   Medication Sig Start Date End Date Taking? Authorizing Provider  furosemide (LASIX) 40 MG tablet Take 1 tablet (40 mg total) by mouth daily. 03/02/21   Mahala Menghini, PA-C  hydrOXYzine (ATARAX/VISTARIL) 25 MG tablet Take 25 mg by mouth every 6 (six) hours as needed.    [provider]  lactulose (CONSTULOSE) 10 GM/15ML solution TAKE 30 MLS BY MOUTH TWICE DAILY AS NEEDED FOR MILD OR MODERATE CONSTIPATION. 03/02/21   Mahala Menghini, PA-C  montelukast (SINGULAIR) 10 MG tablet Take 10 mg by mouth at bedtime.    [provider]  propranolol (INDERAL) 20 MG tablet Take 1 tablet (20 mg total) by mouth 2 (two) times daily. 03/02/21   Mahala Menghini, PA-C  spironolactone (ALDACTONE) 50 MG tablet Take 1 tablet (50 mg total) by mouth daily. 03/02/21    Mahala Menghini, PA-C      Allergies    Patient has no known allergies.    Review of Systems   Review of Systems  Constitutional:  Negative for appetite change, chills and fever.  Respiratory:  Negative for shortness of breath.   Cardiovascular:  Negative for chest pain.  Gastrointestinal:  Negative for abdominal pain, diarrhea, nausea and vomiting.  Skin:        Recurrent boils under both breasts and inner thighs  All other systems reviewed and are negative.  Physical Exam Updated Vital Signs BP 122/66 (BP Location: Left Arm)    Pulse 96    Temp 98.1 F (36.7 C) (Oral)    Resp 18    Ht 5\' 3"  (1.6 m)    Wt 136.1 kg    LMP 09/12/2017 (Exact Date) Comment:     SpO2 99%    BMI 53.14 kg/m  Physical Exam Vitals and nursing note reviewed.  Constitutional:      Appearance: Normal appearance. She is obese. She is not ill-appearing.  Cardiovascular:     Rate and Rhythm: Normal rate and regular rhythm.  Pulmonary:     Effort: Pulmonary effort is normal. No respiratory distress.     Breath sounds: Normal breath sounds.  Abdominal:     Palpations: Abdomen is soft.     Tenderness: There is no abdominal tenderness.  Skin:    General: Skin is warm.     Capillary Refill:  Capillary refill takes less than 2 seconds.     Findings: No erythema.     Comments: Patient has several small pustules to the left inner thigh, underneath left breast.  Several open lesions underneath both breasts, slight purulent drainage noted.  Neurological:     General: No focal deficit present.     Mental Status: She is alert.     Motor: No weakness.    ED Results / Procedures / Treatments   Labs (all labs ordered are listed, but only abnormal results are displayed) Labs Reviewed - No data to display  EKG None  Radiology No results found.  Procedures Procedures    Medications Ordered in ED Medications - No data to display  ED Course/ Medical Decision Making/ A&P                           Medical  Decision Making  Patient here with past medical history significant for hidradenitis suppurativa and questionable diabetes.  Not currently on antidiabetic medications.  Request evaluation of several painful draining areas underneath both breasts and bilateral inner thighs.  History of same.  Followed by dermatology for this.  On exam.  Obese patient with several small pustules and open areas underneath both breast.  Small amount of purulent drainage noted.  No appreciable surrounding erythema.  No drainable abscess noted at this time.  Vital signs reassuring.  Patient well-appearing.  I feel that she is appropriate for outpatient treatment with antibiotics, will provide short course of pain medication as well.  She has upcoming appointment with dermatology.  Discussed skin care, glucose control and weight loss.  CBG 107         Final Clinical Impression(s) / ED Diagnoses Final diagnoses:  Hidradenitis suppurativa    Rx / DC Orders ED Discharge Orders     None         Kem Parkinson, PA-C 03/10/21 1253    Isla Pence, MD 03/10/21 1355

## 2021-03-10 NOTE — ED Notes (Signed)
Cbg 107 

## 2021-03-10 NOTE — ED Triage Notes (Signed)
Pt with abscess to abdomen and three on left leg for over a month. Denies any drainage. Low grade fevers at home.

## 2021-03-17 ENCOUNTER — Other Ambulatory Visit: Payer: Self-pay

## 2021-03-17 MED ORDER — PEG 3350-KCL-NA BICARB-NACL 420 G PO SOLR: 4000.0000 mL | ORAL | 0 refills | Status: DC

## 2021-03-22 LAB — CBG MONITORING, ED: Glucose-Capillary: 107 mg/dL — ABNORMAL HIGH (ref 70–99)

## 2021-04-01 NOTE — Patient Instructions (Signed)
Your procedure is scheduled on: 04/07/2021  Report to [place] Entrance at 8:45    AM.  Call this number if you have problems the morning of surgery: [phone]   Remember:              Follow Directions on the letter you received from Your Physician's office regarding the Bowel Prep              No Smoking the day of Procedure :   Take these medicines the morning of surgery with A SIP OF WATER: Propranolol and Aldactone   Do not wear jewelry, make-up or nail polish.    Do not bring valuables to the hospital.  Contacts, dentures or bridgework may not be worn into surgery.  .   Patients discharged the day of surgery will not be allowed to drive home.     Colonoscopy, Adult, Care After This sheet gives you information about how to care for yourself after your procedure. Your health care provider may also give you more specific instructions. If you have problems or questions, contact your health care provider. What can I expect after the procedure? After the procedure, it is common to have: A small amount of blood in your stool for 24 hours after the procedure. Some gas. Mild abdominal cramping or bloating.  Follow these instructions at home: General instructions  For the first 24 hours after the procedure: Do not drive or use machinery. Do not sign important documents. Do not drink alcohol. Do your regular daily activities at a slower pace than normal. Eat soft, easy-to-digest foods. Rest often. Take over-the-counter or prescription medicines only as told by your health care provider. It is up to you to get the results of your procedure. Ask your health care provider, or the department performing the procedure, when your results will be ready. Relieving cramping and bloating Try walking around when you have cramps or feel bloated. Apply heat to your abdomen as told by your health care provider. Use a heat source that your health care provider recommends, such as a  moist heat pack or a heating pad. Place a towel between your skin and the heat source. Leave the heat on for 20-30 minutes. Remove the heat if your skin turns bright red. This is especially important if you are unable to feel pain, heat, or cold. You may have a greater risk of getting burned. Eating and drinking Drink enough fluid to keep your urine clear or pale yellow. Resume your normal diet as instructed by your health care provider. Avoid heavy or fried foods that are hard to digest. Avoid drinking alcohol for as long as instructed by your health care provider. Contact a health care provider if: You have blood in your stool 2-3 days after the procedure. Get help right away if: You have more than a small spotting of blood in your stool. You pass large blood clots in your stool. Your abdomen is swollen. You have nausea or vomiting. You have a fever. You have increasing abdominal pain that is not relieved with medicine. This information is not intended to replace advice given to you by your health care provider. Make sure you discuss any questions you have with your health care provider. Document Released: 09/28/2003 Document Revised: 11/08/2015 Document Reviewed: 04/27/2015 Elsevier Interactive Patient Education  2018 [facility], Adult, Care After This sheet gives you information about how to care for yourself after your procedure. Your health care provider  may also give you more specific instructions. If you have problems or questions, contact your health care provider. What can I expect after the procedure? After the procedure, it is common to have: A sore throat. Mild stomach pain or discomfort. Bloating. Nausea. Follow these instructions at home:  Follow instructions from your health care provider about what to eat or drink after your procedure. Return to your normal activities as told by your health care provider. Ask your health care provider what  activities are safe for you. Take over-the-counter and prescription medicines only as told by your health care provider. If you were given a sedative during the procedure, it can affect you for several hours. Do not drive or operate machinery until your health care provider says that it is safe. Keep all follow-up visits as told by your health care provider. This is important. Contact a health care provider if you have: A sore throat that lasts longer than one day. Trouble swallowing. Get help right away if: You vomit blood or your vomit looks like coffee grounds. You have: A fever. Bloody, black, or tarry stools. A severe sore throat or you cannot swallow. Difficulty breathing. Severe pain in your chest or abdomen. Summary After the procedure, it is common to have a sore throat, mild stomach discomfort, bloating, and nausea. If you were given a sedative during the procedure, it can affect you for several hours. Do not drive or operate machinery until your health care provider says that it is safe. Follow instructions from your health care provider about what to eat or drink after your procedure. Return to your normal activities as told by your health care provider. This information is not intended to replace advice given to you by your health care provider. Make sure you discuss any questions you have with your health care provider. Document Revised: 12/20/2018 Document Reviewed: 07/16/2017 Elsevier Patient Education  2022 [employer].

## 2021-04-05 ENCOUNTER — Encounter (HOSPITAL_COMMUNITY)
Admission: RE | Admit: 2021-04-05 | Discharge: 2021-04-05 | Disposition: A | Discharge status: Home / Self Care | Payer: Medicaid Other | Source: Ambulatory Visit | Attending: Internal Medicine | Admitting: Internal Medicine

## 2021-04-05 ENCOUNTER — Encounter (HOSPITAL_COMMUNITY): Payer: Self-pay

## 2021-04-05 ENCOUNTER — Telehealth: Payer: Self-pay | Admitting: *Deleted

## 2021-04-05 NOTE — Telephone Encounter (Signed)
Patient will be taken off schedule for Dr. Abbey Chatters on Thursday.

## 2021-04-05 NOTE — Telephone Encounter (Signed)
Called pt x 3 and received fast busy signal

## 2021-04-05 NOTE — Telephone Encounter (Signed)
-----   Message from Jacqulynn Cadet, RN sent at 04/05/2021 10:01 AM EST ----- Regarding: No Show Patient did not show up for PAT this morning

## 2021-04-07 ENCOUNTER — Encounter (HOSPITAL_COMMUNITY): Admission: RE | Payer: Self-pay | Source: Home / Self Care

## 2021-04-07 ENCOUNTER — Ambulatory Visit (HOSPITAL_COMMUNITY): Admission: RE | Admit: 2021-04-07 | Payer: Medicaid Other | Source: Home / Self Care

## 2021-04-07 SURGERY — COLONOSCOPY WITH PROPOFOL
Anesthesia: Monitor Anesthesia Care

## 2021-04-27 MED ORDER — CLENPIQ 10-3.5-12 MG-GM -GM/160ML PO SOLN: 1.0000 | Freq: Once | ORAL | 0 refills | Status: AC

## 2021-04-27 NOTE — Telephone Encounter (Signed)
Patient returned call. Rescheduled for 3/20 at 7:30am. Aware will call back with pre-op appt. Rx resent., instructions mailed. ?

## 2021-04-27 NOTE — Addendum Note (Signed)
Addended by: Cheron Every on: 04/27/2021 04:08 PM ? ? Modules accepted: Orders ? ?

## 2021-04-28 ENCOUNTER — Encounter: Payer: Self-pay | Admitting: *Deleted

## 2021-04-28 NOTE — Telephone Encounter (Signed)
Attempted to call pt regarding pre-op appt but # is d/c'd. I did verify the # in chart was correct with pt yesterday ?

## 2021-05-09 ENCOUNTER — Emergency Department (HOSPITAL_COMMUNITY)
Admission: EM | Admit: 2021-05-09 | Discharge: 2021-05-09 | Disposition: A | Discharge status: Home / Self Care | Payer: Medicaid Other | Attending: Emergency Medicine | Admitting: Emergency Medicine

## 2021-05-09 ENCOUNTER — Other Ambulatory Visit: Payer: Self-pay

## 2021-05-09 ENCOUNTER — Encounter (HOSPITAL_COMMUNITY): Payer: Self-pay

## 2021-05-09 DIAGNOSIS — Z79899 Other long term (current) drug therapy: Secondary | ICD-10-CM | POA: Insufficient documentation

## 2021-05-09 DIAGNOSIS — L02213 Cutaneous abscess of chest wall: Secondary | ICD-10-CM | POA: Diagnosis not present

## 2021-05-09 DIAGNOSIS — L988 Other specified disorders of the skin and subcutaneous tissue: Secondary | ICD-10-CM | POA: Diagnosis present

## 2021-05-09 DIAGNOSIS — B379 Candidiasis, unspecified: Secondary | ICD-10-CM | POA: Diagnosis not present

## 2021-05-09 DIAGNOSIS — I10 Essential (primary) hypertension: Secondary | ICD-10-CM | POA: Diagnosis not present

## 2021-05-09 DIAGNOSIS — L0291 Cutaneous abscess, unspecified: Secondary | ICD-10-CM

## 2021-05-09 LAB — CBG MONITORING, ED: Glucose-Capillary: 86 mg/dL (ref 70–99)

## 2021-05-09 MED ORDER — KETOCONAZOLE 2 % EX CREA: 1.0000 "application " | TOPICAL_CREAM | Freq: Every day | CUTANEOUS | 1 refills | Status: DC

## 2021-05-09 MED ORDER — DOXYCYCLINE HYCLATE 100 MG PO CAPS: 100.0000 mg | ORAL_CAPSULE | Freq: Two times a day (BID) | ORAL | 0 refills | Status: DC

## 2021-05-09 MED ORDER — DOXYCYCLINE HYCLATE 100 MG PO TABS
100.0000 mg | ORAL_TABLET | Freq: Once | ORAL | Status: AC
  Administered 2021-05-09: 100 mg via ORAL
  Filled 2021-05-09: qty 1

## 2021-05-09 NOTE — Discharge Instructions (Signed)
As discussed, it is important that you keep these areas dry as possible.  Apply the cream as directed.  Take the antibiotic as directed until it is finished.  Also, your blood pressure today is elevated.  It is important that you take your antihypertensive medication daily.  Please contact your primary care provider to arrange close follow-up regarding your blood pressure. ?

## 2021-05-09 NOTE — ED Provider Notes (Incomplete)
[facility] EMERGENCY DEPARTMENT Provider Note   CSN: [id number] Arrival date & time: 05/09/21  0845     History {Add pertinent medical, surgical, social history, OB history to HPI:1} Chief Complaint  Patient presents with   Wound Check    Whitney Bush is a 51 y.o. female.   Wound Check Pertinent negatives include no chest pain, no abdominal pain and no shortness of breath.      Whitney Bush is a 51 y.o. female who presents to the Emergency Department complaining of multiple draining "sores" under both breast, left chest wall and lower abdomen.  States these areas have been present for several months the one to her left chest wall began draining several days ago.  She states this is a recurring problem for her, she is contacting dermatologist but has not received an appointment yet.  States areas improve after taking antibiotics.  She is recently been incarcerated and has run out of her antibiotics.  She states the sores have been more frequent and painful since her incarceration.  She denies any fever or chills. Home Medications Prior to Admission medications   Medication Sig Start Date End Date Taking? Authorizing Provider  doxycycline (VIBRAMYCIN) 100 MG capsule Take 1 capsule (100 mg total) by mouth 2 (two) times daily. Patient not taking: Reported on 03/30/2021 03/10/21   Kem Parkinson, PA-C  furosemide (LASIX) 40 MG tablet Take 1 tablet (40 mg total) by mouth daily. Patient not taking: Reported on 03/30/2021 03/02/21   Mahala Menghini, PA-C  hydrOXYzine (ATARAX/VISTARIL) 25 MG tablet Take 25 mg by mouth every 6 (six) hours as needed.    [provider]  lactulose (CONSTULOSE) 10 GM/15ML solution TAKE 30 MLS BY MOUTH TWICE DAILY AS NEEDED FOR MILD OR MODERATE CONSTIPATION. Patient not taking: Reported on 05/06/2021 03/02/21   Mahala Menghini, PA-C  montelukast (SINGULAIR) 10 MG tablet Take 10 mg by mouth at bedtime.    [provider]  polyethylene  glycol-electrolytes (TRILYTE) 420 g solution Take 4,000 mLs by mouth as directed. 03/17/21   Eloise Harman, DO  propranolol (INDERAL) 20 MG tablet Take 1 tablet (20 mg total) by mouth 2 (two) times daily. Patient not taking: Reported on 05/06/2021 03/02/21   Mahala Menghini, PA-C  spironolactone (ALDACTONE) 50 MG tablet Take 1 tablet (50 mg total) by mouth daily. Patient not taking: Reported on 05/06/2021 03/02/21   Mahala Menghini, PA-C  triamcinolone cream (KENALOG) 0.1 % Apply 1 application topically 2 (two) times daily as needed (irritation).    [provider]      Allergies    Patient has no known allergies.    Review of Systems   Review of Systems  Constitutional:  Negative for chills and fever.  Respiratory:  Negative for shortness of breath.   Cardiovascular:  Negative for chest pain.  Gastrointestinal:  Negative for abdominal pain, nausea and vomiting.  Skin:  Positive for wound (Open draining abscesses of the left chest wall, lower abdomen and beneath both breast).  Neurological:  Negative for weakness and numbness.  All other systems reviewed and are negative.  Physical Exam Updated Vital Signs BP (!) 164/111    Pulse 100    Temp 98.6 F (37 C) (Oral)    Resp 18    Ht '5\' 3"'$  (1.6 m)    Wt 125.6 kg    LMP 09/12/2017 (Exact Date) Comment:     SpO2 99%    BMI 49.07 kg/m  Physical Exam Vitals and nursing note reviewed.  Constitutional:      General: She is not in acute distress.    Appearance: Normal appearance. She is obese.  Cardiovascular:     Rate and Rhythm: Normal rate and regular rhythm.     Pulses: Normal pulses.  Pulmonary:     Effort: Pulmonary effort is normal. No respiratory distress.  Abdominal:     Palpations: Abdomen is soft.     Tenderness: There is no abdominal tenderness.  Musculoskeletal:        General: Normal range of motion.  Skin:    General: Skin is warm.     Capillary Refill: Capillary refill takes less than 2 seconds.     Comments:  Open, draining abscesses of the left mid chest wall 2 cm in diameter.  There are several areas of excoriation with mild drainage below both breast, and erythematous weeping lesions of the lower abdominal intertriginous area. No induration   Neurological:     General: No focal deficit present.     Mental Status: She is alert.     Sensory: No sensory deficit.     Motor: No weakness.    ED Results / Procedures / Treatments   Labs (all labs ordered are listed, but only abnormal results are displayed) Labs Reviewed  CBG MONITORING, ED    EKG None  Radiology No results found.  Procedures Procedures  {Document cardiac monitor, telemetry assessment procedure when appropriate:1}  Medications Ordered in ED Medications  doxycycline (VIBRA-TABS) tablet 100 mg (has no administration in time range)    ED Course/ Medical Decision Making/ A&P                           Medical Decision Making Risk Prescription drug management.   Patient here for evaluation and requesting antibiotics for recurrent abscessStates she was recently incarcerated and ran out of her antibiotics.  Since that time, she states the areas have gotten worse.  No fever or chills.  On exam, patient is hypertensive but nontoxic-appearing.  There appears to be a Component of yeast is well.  CBG is 86  She has dermatology follow-up.  We will treat with Antibiotic and antifungal cream.  She is well-appearing and appears appropriate for discharge home.   {Document critical care time when appropriate:1} {Document review of labs and clinical decision tools ie heart score, Chads2Vasc2 etc:1}  {Document your independent review of radiology images, and any outside records:1} {Document your discussion with family members, caretakers, and with consultants:1} {Document social determinants of health affecting pt's care:1} {Document your decision making why or why not admission, treatments were needed:1} Final Clinical  Impression(s) / ED Diagnoses Final diagnoses:  None    Rx / DC Orders ED Discharge Orders     None

## 2021-05-09 NOTE — ED Triage Notes (Signed)
Patient here witt wound to left breast area that she states has been there for several months and is draining. States that she has called Dermatologist and they have not called back. Normally treated with ABT. ?

## 2021-05-10 NOTE — Patient Instructions (Signed)
? ? ? ? ? ? ? ? ? ? Whitney Bush ? 05/10/2021  ?  ? '@PREFPERIOPPHARMACY'$ @ ? ? Your procedure is scheduled on  05/16/2021. ? ? Report to [facility] at  0600  A.M. ? ? Call this number if you have problems the morning of surgery: ? [phone] ? ? Remember: ? Follow the diet and prep instructions given to you by the office. ?  ? Take these medicines the morning of surgery with A SIP OF WATER  ? ?None. ?  ? Do not wear jewelry, make-up or nail polish. ? Do not wear lotions, powders, or perfumes, or deodorant. ? Do not shave 48 hours prior to surgery.  Men may shave face and neck. ? Do not bring valuables to the hospital. ? [facility] is not responsible for any belongings or valuables. ? ?Contacts, dentures or bridgework may not be worn into surgery.  Leave your suitcase in the car.  After surgery it may be brought to your room. ? ?For patients admitted to the hospital, discharge time will be determined by your treatment team. ? ?Patients discharged the day of surgery will not be allowed to drive home and must have someone with them for 24 hours.  ? ? ?Special instructions:   DO NOT smoke tobacco or vape for 24 hours before your procedure. ? ?Please read over the following fact sheets that you were given. ?Anesthesia Post-op Instructions and Care and Recovery After Surgery ?  ? ? ? Upper Endoscopy, Adult, Care After ?This sheet gives you information about how to care for yourself after your procedure. Your health care provider may also give you more specific instructions. If you have problems or questions, contact your health care provider. ?What can I expect after the procedure? ?After the procedure, it is common to have: ?A sore throat. ?Mild stomach pain or discomfort. ?Bloating. ?Nausea. ?Follow these instructions at home: ? ?Follow instructions from your health care provider about what to eat or drink after your procedure. ?Return to your normal activities as told by your health care provider. Ask your health  care provider what activities are safe for you. ?Take over-the-counter and prescription medicines only as told by your health care provider. ?If you were given a sedative during the procedure, it can affect you for several hours. Do not drive or operate machinery until your health care provider says that it is safe. ?Keep all follow-up visits as told by your health care provider. This is important. ?Contact a health care provider if you have: ?A sore throat that lasts longer than one day. ?Trouble swallowing. ?Get help right away if: ?You vomit blood or your vomit looks like coffee grounds. ?You have: ?A fever. ?Bloody, black, or tarry stools. ?A severe sore throat or you cannot swallow. ?Difficulty breathing. ?Severe pain in your chest or abdomen. ?Summary ?After the procedure, it is common to have a sore throat, mild stomach discomfort, bloating, and nausea. ?If you were given a sedative during the procedure, it can affect you for several hours. Do not drive or operate machinery until your health care provider says that it is safe. ?Follow instructions from your health care provider about what to eat or drink after your procedure. ?Return to your normal activities as told by your health care provider. ?This information is not intended to replace advice given to you by your health care provider. Make sure you discuss any questions you have with your health care provider. ?Document Revised: 12/20/2018 Document Reviewed:  07/16/2017 ?Elsevier Patient Education ? [place]. ?Esophageal Dilatation ?Esophageal dilatation, also called esophageal dilation, is a procedure to widen or open a blocked or narrowed part of the esophagus. The esophagus is the part of the body that moves food and liquid from the mouth to the stomach. You may need this procedure if: ?You have a buildup of scar tissue in your esophagus that makes it difficult, painful, or impossible to swallow. This can be caused by gastroesophageal reflux  disease (GERD). ?You have cancer of the esophagus. ?There is a problem with how food moves through your esophagus. ?In some cases, you may need this procedure repeated at a later time to dilate the esophagus gradually. ?Tell a health care provider about: ?Any allergies you have. ?All medicines you are taking, including vitamins, herbs, eye drops, creams, and over-the-counter medicines. ?Any problems you or family members have had with anesthetic medicines. ?Any blood disorders you have. ?Any surgeries you have had. ?Any medical conditions you have. ?Any antibiotic medicines you are required to take before dental procedures. ?Whether you are pregnant or may be pregnant. ?What are the risks? ?Generally, this is a safe procedure. However, problems may occur, including: ?Bleeding due to a tear in the lining of the esophagus. ?A hole, or perforation, in the esophagus. ?What happens before the procedure? ?Ask your health care provider about: ?Changing or stopping your regular medicines. This is especially important if you are taking diabetes medicines or blood thinners. ?Taking medicines such as aspirin and ibuprofen. These medicines can thin your blood. Do not take these medicines unless your health care provider tells you to take them. ?Taking over-the-counter medicines, vitamins, herbs, and supplements. ?Follow instructions from your health care provider about eating or drinking restrictions. ?Plan to have a responsible adult take you home from the hospital or clinic. ?Plan to have a responsible adult care for you for the time you are told after you leave the hospital or clinic. This is important. ?What happens during the procedure? ?You may be given a medicine to help you relax (sedative). ?A numbing medicine may be sprayed into the back of your throat, or you may gargle the medicine. ?Your health care provider may perform the dilatation using various surgical instruments, such as: ?Simple dilators. This instrument is  carefully placed in the esophagus to stretch it. ?Guided wire bougies. This involves using an endoscope to insert a wire into the esophagus. A dilator is passed over this wire to enlarge the esophagus. Then the wire is removed. ?Balloon dilators. An endoscope with a small balloon is inserted into the esophagus. The balloon is inflated to stretch the esophagus and open it up. ?The procedure may vary among health care providers and hospitals. ?What can I expect after the procedure? ?Your blood pressure, heart rate, breathing rate, and blood oxygen level will be monitored until you leave the hospital or clinic. ?Your throat may feel slightly sore and numb. This will get better over time. ?You will not be allowed to eat or drink until your throat is no longer numb. ?When you are able to drink, urinate, and sit on the edge of the bed without nausea or dizziness, you may be able to return home. ?Follow these instructions at home: ?Take over-the-counter and prescription medicines only as told by your health care provider. ?If you were given a sedative during the procedure, it can affect you for several hours. Do not drive or operate machinery until your health care provider says that it is safe. ?  Plan to have a responsible adult care for you for the time you are told. This is important. ?Follow instructions from your health care provider about any eating or drinking restrictions. ?Do not use any products that contain nicotine or tobacco, such as cigarettes, e-cigarettes, and chewing tobacco. If you need help quitting, ask your health care provider. ?Keep all follow-up visits. This is important. ?Contact a health care provider if: ?You have a fever. ?You have pain that is not relieved by medicine. ?Get help right away if: ?You have chest pain. ?You have trouble breathing. ?You have trouble swallowing. ?You vomit blood. ?You have black, tarry, or bloody stools. ?These symptoms may represent a serious problem that is an  emergency. Do not wait to see if the symptoms will go away. Get medical help right away. Call your local emergency services (911 in the [place] Do not drive yourself to the hospital. ?Summary ?Esophageal di

## 2021-05-11 ENCOUNTER — Telehealth: Payer: Self-pay | Admitting: Internal Medicine

## 2021-05-11 NOTE — Telephone Encounter (Signed)
Called pt, states she doesn't want to do it because it's too risky. Endo scheduler informed to cancel procedure for 05/16/21. ?

## 2021-05-11 NOTE — Telephone Encounter (Signed)
Patient called and wants to cancel her procedures  ?

## 2021-05-12 ENCOUNTER — Encounter (HOSPITAL_COMMUNITY)
Admission: RE | Admit: 2021-05-12 | Discharge: 2021-05-12 | Disposition: A | Discharge status: Home / Self Care | Payer: Medicaid Other | Source: Ambulatory Visit | Attending: Internal Medicine | Admitting: Internal Medicine

## 2021-05-12 ENCOUNTER — Encounter (HOSPITAL_COMMUNITY): Payer: Self-pay

## 2021-05-12 NOTE — Telephone Encounter (Signed)
Needs OV with Dr. Gala Romney in 08/2021.  ?

## 2021-05-16 ENCOUNTER — Encounter (HOSPITAL_COMMUNITY): Admission: RE | Payer: Self-pay | Source: Home / Self Care

## 2021-05-16 ENCOUNTER — Ambulatory Visit (HOSPITAL_COMMUNITY): Admission: RE | Admit: 2021-05-16 | Payer: Medicaid Other | Source: Home / Self Care

## 2021-05-16 SURGERY — COLONOSCOPY WITH PROPOFOL
Anesthesia: Monitor Anesthesia Care

## 2021-06-01 ENCOUNTER — Encounter: Payer: Self-pay | Admitting: Internal Medicine

## 2021-06-01 ENCOUNTER — Other Ambulatory Visit: Payer: Self-pay

## 2021-06-01 ENCOUNTER — Ambulatory Visit: Payer: Medicaid Other | Admitting: Internal Medicine

## 2021-06-01 VITALS — BP 118/78 | HR 85 | Temp 97.7°F | Ht 63.0 in | Wt 269.8 lb

## 2021-06-01 DIAGNOSIS — Z86010 Personal history of colonic polyps: Secondary | ICD-10-CM

## 2021-06-01 DIAGNOSIS — Z860101 Personal history of adenomatous and serrated colon polyps: Secondary | ICD-10-CM

## 2021-06-01 DIAGNOSIS — K7030 Alcoholic cirrhosis of liver without ascites: Secondary | ICD-10-CM

## 2021-06-01 DIAGNOSIS — R1319 Other dysphagia: Secondary | ICD-10-CM

## 2021-06-01 MED ORDER — FUROSEMIDE 40 MG PO TABS: 40.0000 mg | ORAL_TABLET | Freq: Every day | ORAL | 2 refills | Status: DC

## 2021-06-01 MED ORDER — PROPRANOLOL HCL 20 MG PO TABS: 20.0000 mg | ORAL_TABLET | Freq: Two times a day (BID) | ORAL | 2 refills | Status: DC

## 2021-06-01 MED ORDER — SPIRONOLACTONE 50 MG PO TABS: 50.0000 mg | ORAL_TABLET | Freq: Every day | ORAL | 2 refills | Status: DC

## 2021-06-01 MED ORDER — LACTULOSE 10 GM/15ML PO SOLN: ORAL | 2 refills | Status: DC

## 2021-06-01 NOTE — Progress Notes (Signed)
? ? ?Primary Care Physician:  Whitney Bush, [place] ?Primary Gastroenterologist:  Dr. Gala Bush ? ?Pre-Procedure History & Physical: ?HPI:  Whitney Bush is a 51 y.o. female morbidly obese with history of ASH/ NASH cirrhosis.  Admits to ongoing alcohol consumption (beer).  Canceled her recent surveillance colonoscopy appointment.  History of tubular adenoma; due for colonoscopy now. ? ?Overdue on cirrhosis care immune to hepatitis A and hepatitis B. ? ?Recently went to the ED for some skin abscesses.  Has been taking doxycycline. ? ?Patient complains of intermittent esophageal dysphagia only when she gets hot.  When she is cold she does not have any trouble swallowing.  She is run out of her medications (spironolactone and Lasix and Inderal).  Known grade 2 varices.  She is never bled. ? ?She did have ultrasound in January which demonstrated benign hepatic cyst and cirrhotic liver. ? ?She states she is trying to stop drinking on multiple occasions.  Her neighbors are "in her business" and she has to drink herself to sleep to get away from them.  She tells me she has moved but her neighbors have followed her to her new place of residence.  Also, volunteers that she has been incarcerated due to "stalking".  States she just cannot get away from bad influences. ? ?Past Medical History:  ?Diagnosis Date  ? Alcohol abuse   ? Alcoholic cirrhosis of liver ([place])   ? immune to Hep A and Hep B  ? Anemia   ? Blood transfusion without reported diagnosis   ? Boil 06/04/2015  ? Chronic kidney disease   ? Clotting disorder ([place])   ? COPD (chronic obstructive pulmonary disease) ([place])   ? Depression   ? GERD (gastroesophageal reflux disease)   ? GI (gastrointestinal bleed) 07/28/2015  ? Heart disease   ? Heart murmur   ? Hyperlipidemia   ? Hypertension   ? Hypothyroidism   ? Migraines   ? OCD (obsessive compulsive disorder)   ? Peripheral edema   ? PTSD (post-traumatic stress disorder)   ? Tubular adenoma   ? ? ?Past Surgical History:   ?Procedure Laterality Date  ? ABCESS DRAINAGE    ? x5; neck, arm, chest, back  ? BIOPSY  02/24/2015  ? Procedure: BIOPSY;  Surgeon: Danie Binder, MD;  Location: [facility];  Service: Endoscopy;;  gastric bx's  ? BIOPSY  12/25/2016  ? Procedure: BIOPSY;  Surgeon: Whitney Dolin, MD;  Location: [facility];  Service: Endoscopy;;  gastric  ? CESAREAN SECTION    ? COLONOSCOPY WITH PROPOFOL N/A 06/28/2015  ? Dr.Maylie Bush- inadequate prep- One 6 mm polyp at the splenic flexure bx= tubular adenoma  ? ESOPHAGEAL BANDING N/A 12/25/2016  ? Procedure: ESOPHAGEAL BANDING with Propofol;  Surgeon: Whitney Dolin, MD;  Location: [facility];  Service: Endoscopy;  Laterality: N/A;  possible banding of varices  ? ESOPHAGOGASTRODUODENOSCOPY (EGD) WITH PROPOFOL N/A 02/24/2015  ? SLF: Grade II esophageal varices Moderate portal hypertensive gastropathy Mild erosive gastritis anemia likely due to many factors: gastritis, gastropathy, coagulopathy, chronic disease  ? ESOPHAGOGASTRODUODENOSCOPY (EGD) WITH PROPOFOL N/A 12/25/2016  ? Whitney Bush: 2 columns of grade 2 esophageal varices, 1 column of grade 3.  Mild portal hypertensive gastropathy, biopsy showing reactive gastropathy but no H. pylori.  Esophagus dilated to 53 [place]  ? KNEE ARTHROSCOPY WITH MEDIAL MENISECTOMY Left 01/09/2019  ? Procedure: KNEE ARTHROSCOPY WITH MEDIAL MENISCECTOMY AND LATERAL MENISCECTOMY;  Surgeon: Whitney Civil, MD;  Location: [facility];  Service: Orthopedics;  Laterality: Left;  ? MALONEY DILATION N/A 12/25/2016  ? Procedure: MALONEY DILATION;  Surgeon: Whitney Dolin, MD;  [facility];  Service: Endoscopy;  Laterality: N/A;  ? POLYPECTOMY  06/28/2015  ? Procedure: POLYPECTOMY;  Surgeon: Whitney Dolin, MD;  [facility];  Service: Endoscopy;;  at splenic flexure  ? SUPRACERVICAL ABDOMINAL HYSTERECTOMY N/A 10/02/2017  ? Procedure: HYSTERECTOMY SUPRACERVICAL ABDOMINAL;  Surgeon: Whitney Kind, MD;  Location: [facility];  Service:  Gynecology;  Laterality: N/A;  ? ? ?Prior to Admission medications   ?Medication Sig Start Date End Date Taking? Authorizing Provider  ?doxycycline (VIBRAMYCIN) 100 MG capsule Take 1 capsule (100 mg total) by mouth 2 (two) times daily. 05/09/21   Bush, Tammy, PA-C  ?furosemide (LASIX) 40 MG tablet Take 1 tablet (40 mg total) by mouth daily. ?Patient not taking: Reported on 03/30/2021 03/02/21   Whitney Menghini, PA-C  ?hydrOXYzine (ATARAX/VISTARIL) 25 MG tablet Take 25 mg by mouth every 6 (six) hours as needed.    [provider]  ?ketoconazole (NIZORAL) 2 % cream Apply 1 application. topically daily. Applied to the affected areas once daily. 05/09/21   Bush, Tammy, PA-C  ?lactulose (CONSTULOSE) 10 GM/15ML solution TAKE 30 MLS BY MOUTH TWICE DAILY AS NEEDED FOR MILD OR MODERATE CONSTIPATION. ?Patient not taking: Reported on 05/06/2021 03/02/21   Whitney Menghini, PA-C  ?montelukast (SINGULAIR) 10 MG tablet Take 10 mg by mouth at bedtime.    [provider]  ?polyethylene glycol-electrolytes (TRILYTE) 420 g solution Take 4,000 mLs by mouth as directed. 03/17/21   Whitney Harman, DO  ?propranolol (INDERAL) 20 MG tablet Take 1 tablet (20 mg total) by mouth 2 (two) times daily. ?Patient not taking: Reported on 05/06/2021 03/02/21   Whitney Menghini, PA-C  ?spironolactone (ALDACTONE) 50 MG tablet Take 1 tablet (50 mg total) by mouth daily. ?Patient not taking: Reported on 05/06/2021 03/02/21   Whitney Menghini, PA-C  ?triamcinolone cream (KENALOG) 0.1 % Apply 1 application topically 2 (two) times daily as needed (irritation).    [provider]  ? ? ?Allergies as of 06/01/2021  ? (No Known Allergies)  ? ? ?Family History  ?Problem Relation Age of Onset  ? Diabetes Father   ? Hyperlipidemia Father   ? Alcohol abuse Father   ? Hypertension Father   ? Cancer Other   ? Cervical cancer Maternal Grandmother   ? Lung cancer Maternal Grandfather   ? Alcohol abuse Other   ?     multiple family members  ?  Diabetes Sister   ? Hypertension Sister   ? Hypertension Brother   ? Colon cancer Neg Hx   ? Liver disease Neg Hx   ? ? ?Social History  ? ?Socioeconomic History  ? Marital status: Widowed  ?  Spouse name: Not on file  ? Number of children: 1  ? Years of education: 27  ? Highest education level: Not on file  ?Occupational History  ? Occupation: unemployed  ?Tobacco Use  ? Smoking status: Former  ?  Packs/day: 1.00  ?  Years: 5.00  ?  Pack years: 5.00  ?  Types: Cigarettes  ?  Quit date: 12/29/2010  ?  Years since quitting: 10.4  ? Smokeless tobacco: Former  ?  Types: Snuff  ?  Quit date: 09/28/2010  ?Vaping Use  ? Vaping Use: Never used  ?Substance and Sexual Activity  ? Alcohol use: Not Currently  ?  Comment: socially  ? Drug use: No  ? Sexual activity: Yes  ?  Birth control/protection: Surgical  ?Other Topics Concern  ? Not on file  ?Social History Narrative  ? Lives with parents  ? ?Social Determinants of Health  ? ?Financial Resource Strain: Not on file  ?Food Insecurity: Not on file  ?Transportation Needs: Not on file  ?Physical Activity: Not on file  ?Stress: Not on file  ?Social Connections: Not on file  ?Intimate Partner Violence: Not on file  ? ? ?Review of Systems: ?See HPI, otherwise negative ROS ? ?Physical Exam: ?LMP 09/12/2017 (Exact Date) Comment:   ?General:   Alert,  Well-developed, well-nourished, pleasant and cooperative in NAD ?Neck:  Supple; no masses or thyromegaly. No significant cervical adenopathy. ?Lungs:  Clear throughout to auscultation.   No wheezes, crackles, or rhonchi. No acute distress. ?Heart:  Regular rate and rhythm; no murmurs, clicks, rubs,  or gallops. ?Abdomen: Non-distended, normal bowel sounds.  Soft and nontender without appreciable mass or hepatosplenomegaly.  ?Pulses:  Normal pulses noted. ?Extremities:  Without clubbing or edema. ? ?Impression/Plan: 51 year old morbidly obese lady with EtOH/NASH related cirrhosis with portal hypertension and known esophageal  varices. ? ?History of colonic adenoma.  Ongoing alcohol use.  Primary prophylaxis with Inderal. ? ?Noncompliance is a major issue. ? ?Discussed the importance of compliance and alcohol cessation. ? ?Report of esophageal dysphagi

## 2021-06-01 NOTE — Patient Instructions (Signed)
It was good to see you again today! ? ?As discussed it would be good for your overall health if you would stop drinking alcohol ? ?INR, Chem-12, CBC, alpha-fetoprotein today ? ?Barium pill esophagram to evaluate difficulty swallowing. ? ?We will proceed on with planning for surveillance colonoscopy (history of colonic polyps).  ASA 4 ? ?Patient will need extra preparation to be reviewed when procedure is scheduled ? ?We will refill all of your GI medications including lactulose, spironolactone, Lasix and Inderal at current dosings for now.  Regimen subject to adjustment based on pending labs. ? ?Repeat hepatic ultrasound August of this year. ? ?Further recommendations to follow. ?

## 2021-06-02 LAB — COMPREHENSIVE METABOLIC PANEL
AG Ratio: 0.8 (calc) — ABNORMAL LOW (ref 1.0–2.5)
ALT: 42 U/L — ABNORMAL HIGH (ref 6–29)
AST: 46 U/L — ABNORMAL HIGH (ref 10–35)
Albumin: 3.7 g/dL (ref 3.6–5.1)
Alkaline phosphatase (APISO): 91 U/L (ref 31–125)
BUN: 13 mg/dL (ref 7–25)
CO2: 24 mmol/L (ref 20–32)
Calcium: 10.1 mg/dL (ref 8.6–10.2)
Chloride: 101 mmol/L (ref 98–110)
Creat: 0.87 mg/dL (ref 0.50–0.99)
Globulin: 4.5 g/dL (calc) — ABNORMAL HIGH (ref 1.9–3.7)
Glucose, Bld: 85 mg/dL (ref 65–99)
Potassium: 4.5 mmol/L (ref 3.5–5.3)
Sodium: 137 mmol/L (ref 135–146)
Total Bilirubin: 1.1 mg/dL (ref 0.2–1.2)
Total Protein: 8.2 g/dL — ABNORMAL HIGH (ref 6.1–8.1)

## 2021-06-02 LAB — CBC WITH DIFFERENTIAL/PLATELET
Absolute Monocytes: 998 cells/uL — ABNORMAL HIGH (ref 200–950)
Basophils Absolute: 70 cells/uL (ref 0–200)
Basophils Relative: 0.6 %
Eosinophils Absolute: 93 cells/uL (ref 15–500)
Eosinophils Relative: 0.8 %
HCT: 42.6 % (ref 35.0–45.0)
Hemoglobin: 14 g/dL (ref 11.7–15.5)
Lymphs Abs: 2552 cells/uL (ref 850–3900)
MCH: 31 pg (ref 27.0–33.0)
MCHC: 32.9 g/dL (ref 32.0–36.0)
MCV: 94.5 fL (ref 80.0–100.0)
MPV: 10.7 fL (ref 7.5–12.5)
Monocytes Relative: 8.6 %
Neutro Abs: 7888 cells/uL — ABNORMAL HIGH (ref 1500–7800)
Neutrophils Relative %: 68 %
Platelets: 214 10*3/uL (ref 140–400)
RBC: 4.51 10*6/uL (ref 3.80–5.10)
RDW: 15.8 % — ABNORMAL HIGH (ref 11.0–15.0)
Total Lymphocyte: 22 %
WBC: 11.6 10*3/uL — ABNORMAL HIGH (ref 3.8–10.8)

## 2021-06-02 LAB — PROTIME-INR
INR: 1.1
Prothrombin Time: 11.2 s (ref 9.0–11.5)

## 2021-06-13 ENCOUNTER — Ambulatory Visit (HOSPITAL_COMMUNITY)
Admission: RE | Admit: 2021-06-13 | Discharge: 2021-06-13 | Disposition: A | Discharge status: Home / Self Care | Payer: Medicaid Other | Source: Ambulatory Visit | Attending: Internal Medicine | Admitting: Internal Medicine

## 2021-06-13 DIAGNOSIS — R1319 Other dysphagia: Secondary | ICD-10-CM | POA: Diagnosis present

## 2021-06-15 ENCOUNTER — Other Ambulatory Visit: Payer: Self-pay

## 2021-06-15 MED ORDER — PANTOPRAZOLE SODIUM 40 MG PO TBEC: 40.0000 mg | DELAYED_RELEASE_TABLET | Freq: Two times a day (BID) | ORAL | 3 refills | Status: DC

## 2021-07-01 ENCOUNTER — Telehealth: Payer: Self-pay | Admitting: *Deleted

## 2021-07-01 MED ORDER — PEG 3350-KCL-NA BICARB-NACL 420 G PO SOLR: ORAL | 0 refills | Status: DC

## 2021-07-01 NOTE — Telephone Encounter (Signed)
Per encounter form patient may need EGD if BPE is positive. Please advise Dr. Gala Romney if EGD needed with colonoscopy? Thanks! ?

## 2021-07-01 NOTE — Telephone Encounter (Signed)
Spoke with pt. She has been scheduled for 6/1. Aware will need to come by office to pick up linzess 245mg sample. She will start 3 days before prep. Also aware will mail prep instructions with her pre-op appt. Will send prep rx to pharmacy. Will need 1.5 preps. Extra day clear liquids.  ?

## 2021-07-05 ENCOUNTER — Encounter: Payer: Self-pay | Admitting: *Deleted

## 2021-07-22 ENCOUNTER — Other Ambulatory Visit: Payer: Self-pay | Admitting: Family

## 2021-07-22 DIAGNOSIS — Z1231 Encounter for screening mammogram for malignant neoplasm of breast: Secondary | ICD-10-CM

## 2021-07-26 ENCOUNTER — Encounter (HOSPITAL_COMMUNITY)
Admission: RE | Admit: 2021-07-26 | Discharge: 2021-07-26 | Disposition: A | Discharge status: Home / Self Care | Payer: Medicaid Other | Source: Ambulatory Visit | Attending: Internal Medicine | Admitting: Internal Medicine

## 2021-07-26 ENCOUNTER — Encounter (HOSPITAL_COMMUNITY): Payer: Self-pay

## 2021-07-26 ENCOUNTER — Telehealth: Payer: Self-pay

## 2021-07-26 NOTE — Patient Instructions (Signed)
Whitney Bush  07/26/2021     '@PREFPERIOPPHARMACY'$ @   Your procedure is scheduled on  07/28/2021.   Report to [facility] at  1030  A.M.   Call this number if you have problems the morning of surgery:  [phone]   Remember:  Follow the diet and prep instructions given to you by the office.    Take these medicines the morning of surgery with A SIP OF WATER                         hydroxyzine, protonix, propranolol.     Do not wear jewelry, make-up or nail polish.  Do not wear lotions, powders, or perfumes, or deodorant.  Do not shave 48 hours prior to surgery.  Men may shave face and neck.  Do not bring valuables to the hospital.  [facility] is not responsible for any belongings or valuables.  Contacts, dentures or bridgework may not be worn into surgery.  Leave your suitcase in the car.  After surgery it may be brought to your room.  For patients admitted to the hospital, discharge time will be determined by your treatment team.   Patients discharged the day of surgery will not be allowed to drive home and must have someone with them for 24 hours.     Special instructions:   DO NOT smoke tobacco or vape for 24 hours before your procedure.  Please read over the following fact sheets that you were given. Anesthesia Post-op Instructions and Care and Recovery After Surgery      Colonoscopy, Adult, Care After The following information offers guidance on how to care for yourself after your procedure. Your health care provider may also give you more specific instructions. If you have problems or questions, contact your health care provider. What can I expect after the procedure? After the procedure, it is common to have: A small amount of blood in your stool for 24 hours after the procedure. Some gas. Mild cramping or bloating of your abdomen. Follow these instructions at home: Eating and drinking  Drink enough fluid to keep your urine pale yellow. Follow  instructions from your health care provider about eating or drinking restrictions. Resume your normal diet as told by your health care provider. Avoid heavy or fried foods that are hard to digest. Activity Rest as told by your health care provider. Avoid sitting for a long time without moving. Get up to take short walks every 1-2 hours. This is important to improve blood flow and breathing. Ask for help if you feel weak or unsteady. Return to your normal activities as told by your health care provider. Ask your health care provider what activities are safe for you. Managing cramping and bloating  Try walking around when you have cramps or feel bloated. If directed, apply heat to your abdomen as told by your health care provider. Use the heat source that your health care provider recommends, such as a moist heat pack or a heating pad. Place a towel between your skin and the heat source. Leave the heat on for 20-30 minutes. Remove the heat if your skin turns bright red. This is especially important if you are unable to feel pain, heat, or cold. You have a greater risk of getting burned. General instructions If you were given a sedative during the procedure, it can affect you for several hours. Do not drive or operate machinery until your  health care provider says that it is safe. For the first 24 hours after the procedure: Do not sign important documents. Do not drink alcohol. Do your regular daily activities at a slower pace than normal. Eat soft foods that are easy to digest. Take over-the-counter and prescription medicines only as told by your health care provider. Keep all follow-up visits. This is important. Contact a health care provider if: You have blood in your stool 2-3 days after the procedure. Get help right away if: You have more than a small spotting of blood in your stool. You have large blood clots in your stool. You have swelling of your abdomen. You have nausea or  vomiting. You have a fever. You have increasing pain in your abdomen that is not relieved with medicine. These symptoms may be an emergency. Get help right away. Call 911. Do not wait to see if the symptoms will go away. Do not drive yourself to the hospital. Summary After the procedure, it is common to have a small amount of blood in your stool. You may also have mild cramping and bloating of your abdomen. If you were given a sedative during the procedure, it can affect you for several hours. Do not drive or operate machinery until your health care provider says that it is safe. Get help right away if you have a lot of blood in your stool, nausea or vomiting, a fever, or increased pain in your abdomen. This information is not intended to replace advice given to you by your health care provider. Make sure you discuss any questions you have with your health care provider. Document Revised: 10/06/2020 Document Reviewed: 10/06/2020 Elsevier Patient Education  [place] After This sheet gives you information about how to care for yourself after your procedure. Your health care provider may also give you more specific instructions. If you have problems or questions, contact your health care provider. What can I expect after the procedure? After the procedure, it is common to have: Tiredness. Forgetfulness about what happened after the procedure. Impaired judgment for important decisions. Nausea or vomiting. Some difficulty with balance. Follow these instructions at home: For the time period you were told by your health care provider:     Rest as needed. Do not participate in activities where you could fall or become injured. Do not drive or use machinery. Do not drink alcohol. Do not take sleeping pills or medicines that cause drowsiness. Do not make important decisions or sign legal documents. Do not take care of children on your own. Eating and  drinking Follow the diet that is recommended by your health care provider. Drink enough fluid to keep your urine pale yellow. If you vomit: Drink water, juice, or soup when you can drink without vomiting. Make sure you have little or no nausea before eating solid foods. General instructions Have a responsible adult stay with you for the time you are told. It is important to have someone help care for you until you are awake and alert. Take over-the-counter and prescription medicines only as told by your health care provider. If you have sleep apnea, surgery and certain medicines can increase your risk for breathing problems. Follow instructions from your health care provider about wearing your sleep device: Anytime you are sleeping, including during daytime naps. While taking prescription pain medicines, sleeping medicines, or medicines that make you drowsy. Avoid smoking. Keep all follow-up visits as told by your health care provider. This is  important. Contact a health care provider if: You keep feeling nauseous or you keep vomiting. You feel light-headed. You are still sleepy or having trouble with balance after 24 hours. You develop a rash. You have a fever. You have redness or swelling around the IV site. Get help right away if: You have trouble breathing. You have new-onset confusion at home. Summary For several hours after your procedure, you may feel tired. You may also be forgetful and have poor judgment. Have a responsible adult stay with you for the time you are told. It is important to have someone help care for you until you are awake and alert. Rest as told. Do not drive or operate machinery. Do not drink alcohol or take sleeping pills. Get help right away if you have trouble breathing, or if you suddenly become confused. This information is not intended to replace advice given to you by your health care provider. Make sure you discuss any questions you have with your  health care provider. Document Revised: 01/18/2021 Document Reviewed: 01/16/2019 Elsevier Patient Education  [place].

## 2021-07-26 NOTE — Telephone Encounter (Signed)
Pt called office to cancel TCS scheduled for 07/28/21. Stated she she is sick and can't do it right now. Advised her to call back when ready to reschedule. Endo scheduler informed.

## 2021-07-28 ENCOUNTER — Encounter (HOSPITAL_COMMUNITY): Admission: RE | Payer: Self-pay | Source: Home / Self Care

## 2021-07-28 ENCOUNTER — Ambulatory Visit (HOSPITAL_COMMUNITY): Admission: RE | Admit: 2021-07-28 | Payer: Medicaid Other | Source: Home / Self Care | Admitting: Internal Medicine

## 2021-07-28 SURGERY — COLONOSCOPY WITH PROPOFOL
Anesthesia: Monitor Anesthesia Care

## 2021-08-23 ENCOUNTER — Inpatient Hospital Stay: Admission: RE | Admit: 2021-08-23 | Payer: Medicaid Other | Source: Ambulatory Visit

## 2021-09-01 ENCOUNTER — Encounter: Payer: Self-pay | Admitting: Internal Medicine

## 2021-09-27 ENCOUNTER — Ambulatory Visit (INDEPENDENT_AMBULATORY_CARE_PROVIDER_SITE_OTHER): Payer: Medicaid Other | Admitting: Internal Medicine

## 2021-09-27 ENCOUNTER — Encounter: Payer: Self-pay | Admitting: *Deleted

## 2021-09-27 ENCOUNTER — Encounter: Payer: Self-pay | Admitting: Internal Medicine

## 2021-09-27 VITALS — BP 126/85 | HR 88 | Temp 97.8°F | Ht 63.0 in | Wt 269.8 lb

## 2021-09-27 DIAGNOSIS — Z86010 Personal history of colonic polyps: Secondary | ICD-10-CM

## 2021-09-27 DIAGNOSIS — K219 Gastro-esophageal reflux disease without esophagitis: Secondary | ICD-10-CM

## 2021-09-27 DIAGNOSIS — K5900 Constipation, unspecified: Secondary | ICD-10-CM

## 2021-09-27 DIAGNOSIS — R1319 Other dysphagia: Secondary | ICD-10-CM | POA: Diagnosis not present

## 2021-09-27 DIAGNOSIS — K7030 Alcoholic cirrhosis of liver without ascites: Secondary | ICD-10-CM | POA: Diagnosis not present

## 2021-09-27 NOTE — Progress Notes (Signed)
Primary Care Physician:  Jerel Shepherd, FNP Primary Gastroenterologist:  Dr.   Pre-Procedure History & Physical: HPI:  Whitney Bush is a 51 y.o. female here for follow-up of epigastric pain.  History of tubular adenoma removed from her colon 2017.  Poor prep.  She was scheduled for EGD and colonoscopy earlier this year but because of her daughter's boyfriend suicide it was put off.  She continues to have paranoid ideation.  Also,  notes intermittent esophageal dysphagia to solids - although BPE failed to demonstrate an obstruction earlier this year.  States she was sober for 2 months but then relapsed.  States she is now on Humira for hidradenitis suppurativa.  Nodular contour of liver seen previously on ultrasound.  Imaging modality suboptimal given patient's large body habitus.  States she takes Linzess on occasion for constipation but that is not on her list of medications.  Labs done earlier this year looked good with a MELD of 8.  Current medication list may be an accurate.  Previously on a PPI and Inderal.  Past Medical History:  Diagnosis Date   Alcohol abuse    Alcoholic cirrhosis of liver (HCC)    immune to Hep A and Hep B   Anemia    Blood transfusion without reported diagnosis    Boil 06/04/2015   Chronic kidney disease    Clotting disorder (HCC)    COPD (chronic obstructive pulmonary disease) (HCC)    Depression    GERD (gastroesophageal reflux disease)    GI (gastrointestinal bleed) 07/28/2015   Heart disease    Heart murmur    Hyperlipidemia    Hypertension    Hypothyroidism    Migraines    OCD (obsessive compulsive disorder)    Peripheral edema    PTSD (post-traumatic stress disorder)    Tubular adenoma     Past Surgical History:  Procedure Laterality Date   ABCESS DRAINAGE     x5; neck, arm, chest, back   BIOPSY  02/24/2015   Procedure: BIOPSY;  Surgeon: Danie Binder, MD;  [facility]: [facility];  Service: Endoscopy;;  gastric bx's    BIOPSY  12/25/2016   Procedure: BIOPSY;  Surgeon: Daneil Dolin, MD;  [facility]: [facility];  Service: Endoscopy;;  gastric   CESAREAN SECTION     COLONOSCOPY WITH PROPOFOL N/A 06/28/2015   Dr.Quintan Saldivar- inadequate prep- One 6 mm polyp at the splenic flexure bx= tubular adenoma   ESOPHAGEAL BANDING N/A 12/25/2016   Procedure: ESOPHAGEAL BANDING with Propofol;  Surgeon: Daneil Dolin, MD;  [facility]: [facility];  Service: Endoscopy;  Laterality: N/A;  possible banding of varices   ESOPHAGOGASTRODUODENOSCOPY (EGD) WITH PROPOFOL N/A 02/24/2015   SLF: Grade II esophageal varices Moderate portal hypertensive gastropathy Mild erosive gastritis anemia likely due to many factors: gastritis, gastropathy, coagulopathy, chronic disease   ESOPHAGOGASTRODUODENOSCOPY (EGD) WITH PROPOFOL N/A 12/25/2016   Enza Shone: 2 columns of grade 2 esophageal varices, 1 column of grade 3.  Mild portal hypertensive gastropathy, biopsy showing reactive gastropathy but no H. pylori.  Esophagus dilated to 52 French   KNEE ARTHROSCOPY WITH MEDIAL MENISECTOMY Left 01/09/2019   Procedure: KNEE ARTHROSCOPY WITH MEDIAL MENISCECTOMY AND LATERAL MENISCECTOMY;  Surgeon: Carole Civil, MD;  Location: [facility];  Service: Orthopedics;  Laterality: Left;   MALONEY DILATION N/A 12/25/2016   Procedure: Venia Minks DILATION;  Surgeon: Daneil Dolin, MD;  [facility]: [facility];  Service: Endoscopy;  Laterality: N/A;   POLYPECTOMY  06/28/2015  Procedure: POLYPECTOMY;  Surgeon: Daneil Dolin, MD;  [facility]: [facility];  Service: Endoscopy;;  at splenic flexure   SUPRACERVICAL ABDOMINAL HYSTERECTOMY N/A 10/02/2017   Procedure: HYSTERECTOMY SUPRACERVICAL ABDOMINAL;  Surgeon: Jonnie Kind, MD;  Location: [facility];  Service: Gynecology;  Laterality: N/A;    Prior to Admission medications   Medication Sig Start Date End Date Taking? Authorizing Provider  Cholecalciferol (VITAMIN D3) 1.25 MG (50000 UT) CAPS Take by mouth.   Yes [provider]  ketoconazole (NIZORAL) 2 % cream Apply 1 application. topically daily. Applied to the affected areas once daily. 05/09/21  Yes Triplett, Tammy, PA-C  lactulose (CONSTULOSE) 10 GM/15ML solution TAKE 30 MLS BY MOUTH TWICE DAILY AS NEEDED FOR MILD OR MODERATE CONSTIPATION. 06/01/21  Yes Olaoluwa Grieder, Cristopher Estimable, MD  pantoprazole (PROTONIX) 40 MG tablet Take 1 tablet (40 mg total) by mouth 2 (two) times daily. 06/15/21  Yes Yashira Offenberger, Cristopher Estimable, MD  VENTOLIN HFA 108 [phone] Base) MCG/ACT inhaler SMARTSIG:1 Puff(s) By Mouth Every 4-6 Hours PRN 08/25/21  Yes [provider]    Allergies as of 09/27/2021   (No Known Allergies)    Family History  Problem Relation Age of Onset   Diabetes Father    Hyperlipidemia Father    Alcohol abuse Father    Hypertension Father    Cancer Other    Cervical cancer Maternal Grandmother    Lung cancer Maternal Grandfather    Alcohol abuse Other        multiple family members   Diabetes Sister    Hypertension Sister    Hypertension Brother    Colon cancer Neg Hx    Liver disease Neg Hx     Social History   Socioeconomic History   Marital status: Widowed    Spouse name: Not on file   Number of children: 1   Years of education: 18   Highest education level: Not on file  Occupational History   Occupation: unemployed  Tobacco Use   Smoking status: Former    Packs/day: 1.00    Years: 5.00    Total pack years: 5.00    Types: Cigarettes    Quit date: 12/29/2010    Years since quitting: 10.7   Smokeless tobacco: Former    Types: Snuff    Quit date: 09/28/2010  Vaping Use   Vaping Use: Never used  Substance and Sexual Activity   Alcohol use: Not Currently    Comment: socially   Drug use: No   Sexual activity: Yes    Birth control/protection: Surgical  Other Topics Concern   Not on file  Social History Narrative   Lives with parents   Social Determinants of Health   Financial Resource Strain: Not on file  Food Insecurity: Not on file   Transportation Needs: Not on file  Physical Activity: Not on file  Stress: Not on file  Social Connections: Not on file  Intimate Partner Violence: Not on file    Review of Systems: See HPI, otherwise negative ROS  Physical Exam: BP 126/85 (BP Location: Right Arm, Patient Position: Sitting, Cuff Size: Large)   Pulse 88   Temp 97.8 F (36.6 C) (Temporal)   Ht '5\' 3"'$  (1.6 m)   Wt 269 lb 12.8 oz (122.4 kg)   LMP 09/12/2017 (Exact Date) Comment:    SpO2 98%   BMI 47.79 kg/m  General:   Morbidly obese.  Audible tattoos upper extremities Mouth:  No deformity or lesions. Neck:  Supple;  no masses or thyromegaly. No significant cervical adenopathy. Lungs:  Clear throughout to auscultation.   No wheezes, crackles, or rhonchi. No acute distress. Heart:  Regular rate and rhythm; no murmurs, clicks, rubs,  or gallops. Abdomen: Significantly obese.  Positive bowel sounds soft nontender.   Pulses:  Normal pulses noted. Extremities:  Without clubbing or edema.  Impression/Plan: 51 year old morbidly obese lady with ongoing alcohol use disorder and secondary cirrhosis secondary to [place].  Has chronic epigastric pain.  Also notes esophageal dysphagia although BPE negative for obstruction.  History of colonic adenoma  -  due for high-quality colonoscopy for surveillance purposes. Her current medications list may not be inaccurate.  Recommendations:  We will get her back on track for diagnostic EGD and a surveillance colonoscopy (ASA 3).  We will review her medications she is taking at home with nursing staff for accuracy.  At some point, we will consider a one-time MRI of the liver to get better imaging as a better screening tool for hepatocellular carcinoma.  Further recommendations to follow.   Notice: This dictation was prepared with Dragon dictation along with smaller phrase technology. Any transcriptional errors that result from this process are unintentional and may not be  corrected upon review.

## 2021-09-27 NOTE — H&P (View-Only) (Signed)
Primary Care Physician:  Jerel Shepherd, FNP Primary Gastroenterologist:  Dr.   Pre-Procedure History & Physical: HPI:  Whitney Bush is a 51 y.o. female here for follow-up of epigastric pain.  History of tubular adenoma removed from her colon 2017.  Poor prep.  She was scheduled for EGD and colonoscopy earlier this year but because of her daughter's boyfriend suicide it was put off.  She continues to have paranoid ideation.  Also,  notes intermittent esophageal dysphagia to solids - although BPE failed to demonstrate an obstruction earlier this year.  States she was sober for 2 months but then relapsed.  States she is now on Humira for hidradenitis suppurativa.  Nodular contour of liver seen previously on ultrasound.  Imaging modality suboptimal given patient's large body habitus.  States she takes Linzess on occasion for constipation but that is not on her list of medications.  Labs done earlier this year looked good with a MELD of 8.  Current medication list may be an accurate.  Previously on a PPI and Inderal.  Past Medical History:  Diagnosis Date   Alcohol abuse    Alcoholic cirrhosis of liver (HCC)    immune to Hep A and Hep B   Anemia    Blood transfusion without reported diagnosis    Boil 06/04/2015   Chronic kidney disease    Clotting disorder (HCC)    COPD (chronic obstructive pulmonary disease) (HCC)    Depression    GERD (gastroesophageal reflux disease)    GI (gastrointestinal bleed) 07/28/2015   Heart disease    Heart murmur    Hyperlipidemia    Hypertension    Hypothyroidism    Migraines    OCD (obsessive compulsive disorder)    Peripheral edema    PTSD (post-traumatic stress disorder)    Tubular adenoma     Past Surgical History:  Procedure Laterality Date   ABCESS DRAINAGE     x5; neck, arm, chest, back   BIOPSY  02/24/2015   Procedure: BIOPSY;  Surgeon: Danie Binder, MD;  [facility];  Service: Endoscopy;;  gastric bx's    BIOPSY  12/25/2016   Procedure: BIOPSY;  Surgeon: Daneil Dolin, MD;  [facility];  Service: Endoscopy;;  gastric   CESAREAN SECTION     COLONOSCOPY WITH PROPOFOL N/A 06/28/2015   Dr.Delayla Hoffmaster- inadequate prep- One 6 mm polyp at the splenic flexure bx= tubular adenoma   ESOPHAGEAL BANDING N/A 12/25/2016   Procedure: ESOPHAGEAL BANDING with Propofol;  Surgeon: Daneil Dolin, MD;  [facility];  Service: Endoscopy;  Laterality: N/A;  possible banding of varices   ESOPHAGOGASTRODUODENOSCOPY (EGD) WITH PROPOFOL N/A 02/24/2015   SLF: Grade II esophageal varices Moderate portal hypertensive gastropathy Mild erosive gastritis anemia likely due to many factors: gastritis, gastropathy, coagulopathy, chronic disease   ESOPHAGOGASTRODUODENOSCOPY (EGD) WITH PROPOFOL N/A 12/25/2016   Brittney Mucha: 2 columns of grade 2 esophageal varices, 1 column of grade 3.  Mild portal hypertensive gastropathy, biopsy showing reactive gastropathy but no H. pylori.  Esophagus dilated to 52 French   KNEE ARTHROSCOPY WITH MEDIAL MENISECTOMY Left 01/09/2019   Procedure: KNEE ARTHROSCOPY WITH MEDIAL MENISCECTOMY AND LATERAL MENISCECTOMY;  Surgeon: Carole Civil, MD;  Location: [facility];  Service: Orthopedics;  Laterality: Left;   MALONEY DILATION N/A 12/25/2016   Procedure: Venia Minks DILATION;  Surgeon: Daneil Dolin, MD;  [facility];  Service: Endoscopy;  Laterality: N/A;   POLYPECTOMY  06/28/2015  Procedure: POLYPECTOMY;  Surgeon: Daneil Dolin, MD;  [facility];  Service: Endoscopy;;  at splenic flexure   SUPRACERVICAL ABDOMINAL HYSTERECTOMY N/A 10/02/2017   Procedure: HYSTERECTOMY SUPRACERVICAL ABDOMINAL;  Surgeon: Jonnie Kind, MD;  Location: [facility];  Service: Gynecology;  Laterality: N/A;    Prior to Admission medications   Medication Sig Start Date End Date Taking? Authorizing Provider  Cholecalciferol (VITAMIN D3) 1.25 MG (50000 UT) CAPS Take by mouth.   Yes [provider]  ketoconazole (NIZORAL) 2 % cream Apply 1 application. topically daily. Applied to the affected areas once daily. 05/09/21  Yes Triplett, Tammy, PA-C  lactulose (CONSTULOSE) 10 GM/15ML solution TAKE 30 MLS BY MOUTH TWICE DAILY AS NEEDED FOR MILD OR MODERATE CONSTIPATION. 06/01/21  Yes Emersyn Kotarski, Cristopher Estimable, MD  pantoprazole (PROTONIX) 40 MG tablet Take 1 tablet (40 mg total) by mouth 2 (two) times daily. 06/15/21  Yes Ameriah Lint, Cristopher Estimable, MD  VENTOLIN HFA 108 [phone] Base) MCG/ACT inhaler SMARTSIG:1 Puff(s) By Mouth Every 4-6 Hours PRN 08/25/21  Yes [provider]    Allergies as of 09/27/2021   (No Known Allergies)    Family History  Problem Relation Age of Onset   Diabetes Father    Hyperlipidemia Father    Alcohol abuse Father    Hypertension Father    Cancer Other    Cervical cancer Maternal Grandmother    Lung cancer Maternal Grandfather    Alcohol abuse Other        multiple family members   Diabetes Sister    Hypertension Sister    Hypertension Brother    Colon cancer Neg Hx    Liver disease Neg Hx     Social History   Socioeconomic History   Marital status: Widowed    Spouse name: Not on file   Number of children: 1   Years of education: 52   Highest education level: Not on file  Occupational History   Occupation: unemployed  Tobacco Use   Smoking status: Former    Packs/day: 1.00    Years: 5.00    Total pack years: 5.00    Types: Cigarettes    Quit date: 12/29/2010    Years since quitting: 10.7   Smokeless tobacco: Former    Types: Snuff    Quit date: 09/28/2010  Vaping Use   Vaping Use: Never used  Substance and Sexual Activity   Alcohol use: Not Currently    Comment: socially   Drug use: No   Sexual activity: Yes    Birth control/protection: Surgical  Other Topics Concern   Not on file  Social History Narrative   Lives with parents   Social Determinants of Health   Financial Resource Strain: Not on file  Food Insecurity: Not on file   Transportation Needs: Not on file  Physical Activity: Not on file  Stress: Not on file  Social Connections: Not on file  Intimate Partner Violence: Not on file    Review of Systems: See HPI, otherwise negative ROS  Physical Exam: BP 126/85 (BP Location: Right Arm, Patient Position: Sitting, Cuff Size: Large)   Pulse 88   Temp 97.8 F (36.6 C) (Temporal)   Ht '5\' 3"'$  (1.6 m)   Wt 269 lb 12.8 oz (122.4 kg)   LMP 09/12/2017 (Exact Date) Comment:    SpO2 98%   BMI 47.79 kg/m  General:   Morbidly obese.  Audible tattoos upper extremities Mouth:  No deformity or lesions. Neck:  Supple;  no masses or thyromegaly. No significant cervical adenopathy. Lungs:  Clear throughout to auscultation.   No wheezes, crackles, or rhonchi. No acute distress. Heart:  Regular rate and rhythm; no murmurs, clicks, rubs,  or gallops. Abdomen: Significantly obese.  Positive bowel sounds soft nontender.   Pulses:  Normal pulses noted. Extremities:  Without clubbing or edema.  Impression/Plan: 51 year old morbidly obese lady with ongoing alcohol use disorder and secondary cirrhosis secondary to [place].  Has chronic epigastric pain.  Also notes esophageal dysphagia although BPE negative for obstruction.  History of colonic adenoma  -  due for high-quality colonoscopy for surveillance purposes. Her current medications list may not be inaccurate.  Recommendations:  We will get her back on track for diagnostic EGD and a surveillance colonoscopy (ASA 3).  We will review her medications she is taking at home with nursing staff for accuracy.  At some point, we will consider a one-time MRI of the liver to get better imaging as a better screening tool for hepatocellular carcinoma.  Further recommendations to follow.   Notice: This dictation was prepared with Dragon dictation along with smaller phrase technology. Any transcriptional errors that result from this process are unintentional and may not be  corrected upon review.

## 2021-09-27 NOTE — Patient Instructions (Signed)
It was good to see you again today!  As discussed, it continues to be recommended you undergo an EGD for epigastric pain and have a surveillance colonoscopy (history of colonic adenoma).  After discussion, we will go ahead and schedule these procedures in the near future.  ASA 3.  No change in your medication regimen for the time being until after the procedures have been performed  It is very important to adhere to prep instructions to ensure a good colonoscopy preparation.

## 2021-10-21 NOTE — Patient Instructions (Signed)
Your procedure is scheduled on: 10/26/2021  Report to [facility] at  10:30   AM.  Call this number if you have problems the morning of surgery: [phone]   Remember:              Follow Directions on the letter you received from Your Physician's office regarding the Bowel Prep              No Smoking the day of Procedure :   Take these medicines the morning of surgery with A SIP OF WATER: none   Do not wear jewelry, make-up or nail polish.    Do not bring valuables to the hospital.  Contacts, dentures or bridgework may not be worn into surgery.  .   Patients discharged the day of surgery will not be allowed to drive home.     Colonoscopy, Adult, Care After This sheet gives you information about how to care for yourself after your procedure. Your health care provider may also give you more specific instructions. If you have problems or questions, contact your health care provider. What can I expect after the procedure? After the procedure, it is common to have: A small amount of blood in your stool for 24 hours after the procedure. Some gas. Mild abdominal cramping or bloating.  Follow these instructions at home: General instructions  For the first 24 hours after the procedure: Do not drive or use machinery. Do not sign important documents. Do not drink alcohol. Do your regular daily activities at a slower pace than normal. Eat soft, easy-to-digest foods. Rest often. Take over-the-counter or prescription medicines only as told by your health care provider. It is up to you to get the results of your procedure. Ask your health care provider, or the department performing the procedure, when your results will be ready. Relieving cramping and bloating Try walking around when you have cramps or feel bloated. Apply heat to your abdomen as told by your health care provider. Use a heat source that your health care provider recommends, such as a moist heat pack or a  heating pad. Place a towel between your skin and the heat source. Leave the heat on for 20-30 minutes. Remove the heat if your skin turns bright red. This is especially important if you are unable to feel pain, heat, or cold. You may have a greater risk of getting burned. Eating and drinking Drink enough fluid to keep your urine clear or pale yellow. Resume your normal diet as instructed by your health care provider. Avoid heavy or fried foods that are hard to digest. Avoid drinking alcohol for as long as instructed by your health care provider. Contact a health care provider if: You have blood in your stool 2-3 days after the procedure. Get help right away if: You have more than a small spotting of blood in your stool. You pass large blood clots in your stool. Your abdomen is swollen. You have nausea or vomiting. You have a fever. You have increasing abdominal pain that is not relieved with medicine. This information is not intended to replace advice given to you by your health care provider. Make sure you discuss any questions you have with your health care provider. Document Released: 09/28/2003 Document Revised: 11/08/2015 Document Reviewed: 04/27/2015 Elsevier Interactive Patient Education  2018 [facility], Adult, Care After After the procedure, it is common to have a sore throat. It is also common to have: Mild stomach pain  or discomfort. Bloating. Nausea. Follow these instructions at home: The instructions below may help you care for yourself at home. Your health care provider may give you more instructions. If you have questions, ask your health care provider. If you were given a sedative during the procedure, it can affect you for several hours. Do not drive or operate machinery until your health care provider says that it is safe. If you will be going home right after the procedure, plan to have a responsible adult: Take you home from the hospital or  clinic. You will not be allowed to drive. Care for you for the time you are told. Follow instructions from your health care provider about what you may eat and drink. Return to your normal activities as told by your health care provider. Ask your health care provider what activities are safe for you. Take over-the-counter and prescription medicines only as told by your health care provider. Contact a health care provider if you: Have a sore throat that lasts longer than one day. Have trouble swallowing. Have a fever. Get help right away if you: Vomit blood or your vomit looks like coffee grounds. Have bloody, black, or tarry stools. Have a very bad sore throat or you cannot swallow. Have difficulty breathing or very bad pain in your chest or abdomen. These symptoms may be an emergency. Get help right away. Call 911. Do not wait to see if the symptoms will go away. Do not drive yourself to the hospital. Summary After the procedure, it is common to have a sore throat, mild stomach discomfort, bloating, and nausea. If you were given a sedative during the procedure, it can affect you for several hours. Do not drive until your health care provider says that it is safe. Follow instructions from your health care provider about what you may eat and drink. Return to your normal activities as told by your health care provider. This information is not intended to replace advice given to you by your health care provider. Make sure you discuss any questions you have with your health care provider. Document Revised: 05/25/2021 Document Reviewed: 05/25/2021 Elsevier Patient Education  [place].

## 2021-10-24 ENCOUNTER — Encounter (HOSPITAL_COMMUNITY)
Admission: RE | Admit: 2021-10-24 | Discharge: 2021-10-24 | Disposition: A | Discharge status: Home / Self Care | Payer: Medicaid Other | Source: Ambulatory Visit | Attending: Internal Medicine | Admitting: Internal Medicine

## 2021-10-24 ENCOUNTER — Encounter (HOSPITAL_COMMUNITY): Payer: Self-pay

## 2021-10-24 DIAGNOSIS — K7030 Alcoholic cirrhosis of liver without ascites: Secondary | ICD-10-CM

## 2021-10-24 DIAGNOSIS — I10 Essential (primary) hypertension: Secondary | ICD-10-CM | POA: Diagnosis not present

## 2021-10-24 DIAGNOSIS — Z01818 Encounter for other preprocedural examination: Secondary | ICD-10-CM | POA: Diagnosis present

## 2021-10-24 LAB — PROTIME-INR
INR: 1.2 (ref 0.8–1.2)
Prothrombin Time: 15.3 seconds — ABNORMAL HIGH (ref 11.4–15.2)

## 2021-10-24 LAB — COMPREHENSIVE METABOLIC PANEL
ALT: 27 U/L (ref 0–44)
AST: 32 U/L (ref 15–41)
Albumin: 3.5 g/dL (ref 3.5–5.0)
Alkaline Phosphatase: 88 U/L (ref 38–126)
Anion gap: 12 (ref 5–15)
BUN: 7 mg/dL (ref 6–20)
CO2: 24 mmol/L (ref 22–32)
Calcium: 8.9 mg/dL (ref 8.9–10.3)
Chloride: 106 mmol/L (ref 98–111)
Creatinine, Ser: 0.78 mg/dL (ref 0.44–1.00)
GFR, Estimated: >60 mL/min (ref >60)
Glucose, Bld: 94 mg/dL (ref 70–99)
Potassium: 3.7 mmol/L (ref 3.5–5.1)
Sodium: 142 mmol/L (ref 135–145)
Total Bilirubin: 1.1 mg/dL (ref 0.3–1.2)
Total Protein: 8.4 g/dL — ABNORMAL HIGH (ref 6.5–8.1)

## 2021-10-24 LAB — CBC
HCT: 41.5 % (ref 36.0–46.0)
Hemoglobin: 13.5 g/dL (ref 12.0–15.0)
MCH: 30.1 pg (ref 26.0–34.0)
MCHC: 32.5 g/dL (ref 30.0–36.0)
MCV: 92.4 fL (ref 80.0–100.0)
Platelets: 148 10*3/uL — ABNORMAL LOW (ref 150–400)
RBC: 4.49 MIL/uL (ref 3.87–5.11)
RDW: 15 % (ref 11.5–15.5)
WBC: 9.2 10*3/uL (ref 4.0–10.5)
nRBC: 0 % (ref 0.0–0.2)

## 2021-10-25 ENCOUNTER — Other Ambulatory Visit: Payer: Self-pay | Admitting: *Deleted

## 2021-10-25 MED ORDER — PEG 3350-KCL-NA BICARB-NACL 420 G PO SOLR: 4000.0000 mL | Freq: Once | ORAL | 0 refills | Status: AC

## 2021-10-25 MED ORDER — PEG 3350-KCL-NA BICARB-NACL 420 G PO SOLR: 4000.0000 mL | Freq: Once | ORAL | 0 refills | Status: DC

## 2021-10-25 NOTE — Pre-Procedure Instructions (Signed)
Ned Card, LPN called prep in for patient and notified patient of this as well.

## 2021-10-25 NOTE — Pre-Procedure Instructions (Signed)
Patient had pre-op on yesterday, 8/28. She calls back today to tell us that her pharmacy does not have the prep prescription for her to do prep today. I messaged [facility] to let them know and see what the want to do. l  will call her back when I hear from office. She verbalized understanding of this.

## 2021-10-26 ENCOUNTER — Ambulatory Visit (HOSPITAL_BASED_OUTPATIENT_CLINIC_OR_DEPARTMENT_OTHER): Payer: Medicaid Other | Admitting: Certified Registered Nurse Anesthetist

## 2021-10-26 ENCOUNTER — Encounter (HOSPITAL_COMMUNITY): Payer: Self-pay | Admitting: Internal Medicine

## 2021-10-26 ENCOUNTER — Ambulatory Visit (HOSPITAL_COMMUNITY): Payer: Medicaid Other | Admitting: Certified Registered Nurse Anesthetist

## 2021-10-26 ENCOUNTER — Ambulatory Visit (HOSPITAL_COMMUNITY)
Admission: RE | Admit: 2021-10-26 | Discharge: 2021-10-26 | Disposition: A | Discharge status: Home / Self Care | Payer: Medicaid Other | Source: Ambulatory Visit | Attending: Internal Medicine | Admitting: Internal Medicine

## 2021-10-26 ENCOUNTER — Encounter (HOSPITAL_COMMUNITY)
Admission: RE | Disposition: A | Discharge status: Home / Self Care | Payer: Self-pay | Source: Ambulatory Visit | Attending: Internal Medicine

## 2021-10-26 ENCOUNTER — Telehealth: Payer: Self-pay

## 2021-10-26 DIAGNOSIS — Z6841 Body Mass Index (BMI) 40.0 and over, adult: Secondary | ICD-10-CM | POA: Insufficient documentation

## 2021-10-26 DIAGNOSIS — I129 Hypertensive chronic kidney disease with stage 1 through stage 4 chronic kidney disease, or unspecified chronic kidney disease: Secondary | ICD-10-CM

## 2021-10-26 DIAGNOSIS — R1013 Epigastric pain: Secondary | ICD-10-CM | POA: Diagnosis not present

## 2021-10-26 DIAGNOSIS — K5900 Constipation, unspecified: Secondary | ICD-10-CM | POA: Diagnosis not present

## 2021-10-26 DIAGNOSIS — F1010 Alcohol abuse, uncomplicated: Secondary | ICD-10-CM | POA: Diagnosis not present

## 2021-10-26 DIAGNOSIS — Z86010 Personal history of colonic polyps: Secondary | ICD-10-CM | POA: Diagnosis not present

## 2021-10-26 DIAGNOSIS — Z1211 Encounter for screening for malignant neoplasm of colon: Secondary | ICD-10-CM | POA: Insufficient documentation

## 2021-10-26 DIAGNOSIS — L732 Hidradenitis suppurativa: Secondary | ICD-10-CM | POA: Diagnosis not present

## 2021-10-26 DIAGNOSIS — Z87891 Personal history of nicotine dependence: Secondary | ICD-10-CM | POA: Diagnosis not present

## 2021-10-26 DIAGNOSIS — Z79899 Other long term (current) drug therapy: Secondary | ICD-10-CM | POA: Diagnosis not present

## 2021-10-26 DIAGNOSIS — K219 Gastro-esophageal reflux disease without esophagitis: Secondary | ICD-10-CM | POA: Diagnosis not present

## 2021-10-26 DIAGNOSIS — K319 Disease of stomach and duodenum, unspecified: Secondary | ICD-10-CM | POA: Insufficient documentation

## 2021-10-26 DIAGNOSIS — K7581 Nonalcoholic steatohepatitis (NASH): Secondary | ICD-10-CM | POA: Insufficient documentation

## 2021-10-26 DIAGNOSIS — I8500 Esophageal varices without bleeding: Secondary | ICD-10-CM

## 2021-10-26 DIAGNOSIS — I8510 Secondary esophageal varices without bleeding: Secondary | ICD-10-CM | POA: Insufficient documentation

## 2021-10-26 DIAGNOSIS — N189 Chronic kidney disease, unspecified: Secondary | ICD-10-CM

## 2021-10-26 DIAGNOSIS — Z09 Encounter for follow-up examination after completed treatment for conditions other than malignant neoplasm: Secondary | ICD-10-CM | POA: Diagnosis not present

## 2021-10-26 DIAGNOSIS — K7030 Alcoholic cirrhosis of liver without ascites: Secondary | ICD-10-CM | POA: Insufficient documentation

## 2021-10-26 DIAGNOSIS — K3189 Other diseases of stomach and duodenum: Secondary | ICD-10-CM | POA: Diagnosis not present

## 2021-10-26 DIAGNOSIS — D631 Anemia in chronic kidney disease: Secondary | ICD-10-CM

## 2021-10-26 HISTORY — PX: BIOPSY: SHX5522

## 2021-10-26 HISTORY — PX: ESOPHAGOGASTRODUODENOSCOPY (EGD) WITH PROPOFOL: SHX5813

## 2021-10-26 HISTORY — PX: COLONOSCOPY WITH PROPOFOL: SHX5780

## 2021-10-26 SURGERY — COLONOSCOPY WITH PROPOFOL
Anesthesia: General

## 2021-10-26 MED ORDER — PROPOFOL 10 MG/ML IV BOLUS
INTRAVENOUS | Status: DC | PRN
  Administered 2021-10-26: 50 mg via INTRAVENOUS
  Administered 2021-10-26: 150 mg via INTRAVENOUS

## 2021-10-26 MED ORDER — LACTATED RINGERS IV SOLN: INTRAVENOUS | Status: DC | PRN

## 2021-10-26 MED ORDER — PROPOFOL 500 MG/50ML IV EMUL
INTRAVENOUS | Status: AC
  Filled 2021-10-26: qty 50

## 2021-10-26 MED ORDER — PROPOFOL 500 MG/50ML IV EMUL
INTRAVENOUS | Status: DC | PRN
  Administered 2021-10-26: 180 ug/kg/min via INTRAVENOUS

## 2021-10-26 MED ORDER — GLYCOPYRROLATE PF 0.2 MG/ML IJ SOSY
PREFILLED_SYRINGE | INTRAMUSCULAR | Status: DC | PRN
  Administered 2021-10-26: .2 mg via INTRAVENOUS

## 2021-10-26 MED ORDER — PROPRANOLOL HCL 20 MG PO TABS: 40.0000 mg | ORAL_TABLET | Freq: Every day | ORAL | 11 refills | Status: DC

## 2021-10-26 MED ORDER — GLYCOPYRROLATE PF 0.2 MG/ML IJ SOSY
PREFILLED_SYRINGE | INTRAMUSCULAR | Status: AC
  Filled 2021-10-26: qty 1

## 2021-10-26 MED ORDER — LIDOCAINE HCL (PF) 2 % IJ SOLN
INTRAMUSCULAR | Status: AC
  Filled 2021-10-26: qty 5

## 2021-10-26 MED ORDER — LIDOCAINE HCL (CARDIAC) PF 100 MG/5ML IV SOSY
PREFILLED_SYRINGE | INTRAVENOUS | Status: DC | PRN
  Administered 2021-10-26: 50 mg via INTRAVENOUS

## 2021-10-26 MED ORDER — EPHEDRINE 5 MG/ML INJ
INTRAVENOUS | Status: AC
  Filled 2021-10-26: qty 5

## 2021-10-26 NOTE — Op Note (Signed)
[facility] Patient Name: Whitney Bush Procedure Date: 10/26/2021 11:31 AM MRN: [id number] Date of Birth: 1970/06/21 Attending MD: Norvel Richards , MD CSN: [id number] Age: 51 Admit Type: Outpatient Procedure:                Colonoscopy Indications:              High risk colon cancer surveillance: Personal                            history of colonic polyps Providers:                Norvel Richards, MD, Janeece Riggers, RN, Raphael Gibney, Technician Referring MD:              Medicines:                Propofol per Anesthesia Complications:            No immediate complications. Estimated Blood Loss:     Estimated blood loss: none. Procedure:                Pre-Anesthesia Assessment:                           - Prior to the procedure, a History and Physical                            was performed, and patient medications and                            allergies were reviewed. The patient's tolerance of                            previous anesthesia was also reviewed. The risks                            and benefits of the procedure and the sedation                            options and risks were discussed with the patient.                            All questions were answered, and informed consent                            was obtained. Prior Anticoagulants: The patient has                            taken no previous anticoagulant or antiplatelet                            agents. ASA Grade Assessment: III - A patient with  severe systemic disease. After reviewing the risks                            and benefits, the patient was deemed in                            satisfactory condition to undergo the procedure.                           After obtaining informed consent, the colonoscope                            was passed under direct vision. Throughout the                            procedure, the  patient's blood pressure, pulse, and                            oxygen saturations were monitored continuously. The                            [id number]) scope was introduced through the                            anus and advanced to the the cecum, identified by                            appendiceal orifice and ileocecal valve. The                            colonoscopy was performed without difficulty. The                            patient tolerated the procedure well. The quality                            of the bowel preparation was adequate. Scope In: 12:35:13 PM Scope Out: 12:45:05 PM Scope Withdrawal Time: 0 hours 5 minutes 55 seconds  Total Procedure Duration: 0 hours 9 minutes 52 seconds  Findings:      The perianal and digital rectal examinations were normal.      The colon (entire examined portion) appeared normal.      The retroflexed view of the distal rectum and anal verge was normal and       showed no anal or rectal abnormalities. Impression:               - The entire examined colon is normal.                           - The distal rectum and anal verge are normal on                            retroflexion view.                           -  No specimens collected. Moderate Sedation:      Moderate (conscious) sedation was personally administered by an       anesthesia professional. The following parameters were monitored: oxygen       saturation, heart rate, blood pressure, respiratory rate, EKG, adequacy       of pulmonary ventilation, and response to care. Recommendation:           - Patient has a contact number available for                            emergencies. The signs and symptoms of potential                            delayed complications were discussed with the                            patient. Return to normal activities tomorrow.                            Written discharge instructions were provided to the                             patient.                           - Resume previous diet.                           - Continue present medications.                           - Repeat colonoscopy in 5 years for surveillance.                           - Return to GI office in 3 months. See EGD report Procedure Code(s):        --- Professional ---                           [id number], Colonoscopy, flexible; diagnostic, including                            collection of specimen(s) by brushing or washing,                            when performed (separate procedure) Diagnosis Code(s):        --- Professional ---                           Z86.010, Personal history of colonic polyps CPT copyright 2019 American Medical Association. All rights reserved. The codes documented in this report are preliminary and upon coder review may  be revised to meet current compliance requirements. Cristopher Estimable. Anguel Delapena, MD Norvel Richards, MD 10/26/2021 12:57:10 PM This report has been signed electronically. Number of Addenda: 0

## 2021-10-26 NOTE — Telephone Encounter (Signed)
RX was sent to pharmacy on file. Patient was made aware.

## 2021-10-26 NOTE — Transfer of Care (Signed)
Immediate Anesthesia Transfer of Care Note  Patient: Whitney Bush  Procedure(s) Performed: COLONOSCOPY WITH PROPOFOL ESOPHAGOGASTRODUODENOSCOPY (EGD) WITH PROPOFOL BIOPSY  Patient Location: PACU  Anesthesia Type:General  Level of Consciousness: awake, alert  and oriented  Airway & Oxygen Therapy: Patient Spontanous Breathing  Post-op Assessment: Report given to RN, Post -op Vital signs reviewed and stable, Patient moving all extremities X 4 and Patient able to stick tongue midline  Post vital signs: Reviewed  Last Vitals:  Vitals Value Taken Time  BP 90/78   Temp 97.8   Pulse 112   Resp 15   SpO2 100     Last Pain:  Vitals:   10/26/21 1251  TempSrc: Oral  PainSc: 0-No pain         Complications: No notable events documented.

## 2021-10-26 NOTE — Discharge Instructions (Addendum)
Colonoscopy Discharge Instructions  Read the instructions outlined below and refer to this sheet in the next few weeks. These discharge instructions provide you with general information on caring for yourself after you leave the hospital. Your doctor may also give you specific instructions. While your treatment has been planned according to the most current medical practices available, unavoidable complications occasionally occur. If you have any problems or questions after discharge, call Dr. Gala Romney at [phone]. ACTIVITY You may resume your regular activity, but move at a slower pace for the next 24 hours.  Take frequent rest periods for the next 24 hours.  Walking will help get rid of the air and reduce the bloated feeling in your belly (abdomen).  No driving for 24 hours (because of the medicine (anesthesia) used during the test).   Do not sign any important legal documents or operate any machinery for 24 hours (because of the anesthesia used during the test).  NUTRITION Drink plenty of fluids.  You may resume your normal diet as instructed by your doctor.  Begin with a light meal and progress to your normal diet. Heavy or fried foods are harder to digest and may make you feel sick to your stomach (nauseated).  Avoid alcoholic beverages for 24 hours or as instructed.  MEDICATIONS You may resume your normal medications unless your doctor tells you otherwise.  WHAT YOU CAN EXPECT TODAY Some feelings of bloating in the abdomen.  Passage of more gas than usual.  Spotting of blood in your stool or on the toilet paper.  IF YOU HAD POLYPS REMOVED DURING THE COLONOSCOPY: No aspirin products for 7 days or as instructed.  No alcohol for 7 days or as instructed.  Eat a soft diet for the next 24 hours.  FINDING OUT THE RESULTS OF YOUR TEST Not all test results are available during your visit. If your test results are not back during the visit, make an appointment with your caregiver to find out the  results. Do not assume everything is normal if you have not heard from your caregiver or the medical facility. It is important for you to follow up on all of your test results.  SEEK IMMEDIATE MEDICAL ATTENTION IF: You have more than a spotting of blood in your stool.  Your belly is swollen (abdominal distention).  You are nauseated or vomiting.  You have a temperature over 101.  You have abdominal pain or discomfort that is severe or gets worse throughout the day.   EGD Discharge instructions Please read the instructions outlined below and refer to this sheet in the next few weeks. These discharge instructions provide you with general information on caring for yourself after you leave the hospital. Your doctor may also give you specific instructions. While your treatment has been planned according to the most current medical practices available, unavoidable complications occasionally occur. If you have any problems or questions after discharge, please call your doctor. ACTIVITY You may resume your regular activity but move at a slower pace for the next 24 hours.  Take frequent rest periods for the next 24 hours.  Walking will help expel (get rid of) the air and reduce the bloated feeling in your abdomen.  No driving for 24 hours (because of the anesthesia (medicine) used during the test).  You may shower.  Do not sign any important legal documents or operate any machinery for 24 hours (because of the anesthesia used during the test).  NUTRITION Drink plenty of fluids.  You may  resume your normal diet.  Begin with a light meal and progress to your normal diet.  Avoid alcoholic beverages for 24 hours or as instructed by your caregiver.  MEDICATIONS You may resume your normal medications unless your caregiver tells you otherwise.  WHAT YOU CAN EXPECT TODAY You may experience abdominal discomfort such as a feeling of fullness or "gas" pains.  FOLLOW-UP Your doctor will discuss the results of  your test with you.  SEEK IMMEDIATE MEDICAL ATTENTION IF ANY OF THE FOLLOWING OCCUR: Excessive nausea (feeling sick to your stomach) and/or vomiting.  Severe abdominal pain and distention (swelling).  Trouble swallowing.  Temperature over 101 F (37.8 C).  Rectal bleeding or vomiting of blood.       No polyps found in your colon today.  Your prep was better than last time  I recommend a repeat colonoscopy in 5 years  Your stomach was inflamed.  Biopsies were taken.  You should be taking Inderal 40 mg daily.  We will make sure you have a new prescription.  You should be taking (2) 20 mg tablets every day -   New prescription is being sent to your pharmacy from my office.  Office appointment with Neil Crouch in 3 months  I recommend you have a repeat EGD in 3 to 6 months for possible banding of esophageal varices.    At patient request, I called Vanessa Kick at [phone] -  reviewed findings and recommendations

## 2021-10-26 NOTE — Telephone Encounter (Signed)
-----   Message from Daneil Dolin, MD sent at 10/26/2021 12:52 PM EDT -----  this patient specially taking Inderal 40 mg daily (2 -  20 mg tablets daily)   lets make sure she has a good prescription.  Please call in this prescription Inderal 20 mg tablets dispense 60 with 11 refills.  And let patient know.

## 2021-10-26 NOTE — Anesthesia Preprocedure Evaluation (Signed)
Anesthesia Evaluation  Patient identified by MRN, date of birth, ID band Patient awake    Reviewed: Allergy & Precautions, H&P , NPO status , Patient's Chart, lab work & pertinent test results, reviewed documented beta blocker date and time   Airway Mallampati: III  TM Distance: >3 FB Neck ROM: full    Dental no notable dental hx.    Pulmonary COPD, former smoker,    Pulmonary exam normal breath sounds clear to auscultation       Cardiovascular Exercise Tolerance: Good hypertension, negative cardio ROS   Rhythm:regular Rate:Normal     Neuro/Psych  Headaches, PSYCHIATRIC DISORDERS Anxiety Depression    GI/Hepatic Neg liver ROS, GERD  Medicated,  Endo/Other  Hypothyroidism Morbid obesity  Renal/GU CRF and ARFRenal disease  negative genitourinary   Musculoskeletal   Abdominal   Peds  Hematology  (+) Blood dyscrasia, anemia ,   Anesthesia Other Findings   Reproductive/Obstetrics negative OB ROS                             Anesthesia Physical Anesthesia Plan  ASA: 3  Anesthesia Plan: General   Post-op Pain Management:    Induction:   PONV Risk Score and Plan: Propofol infusion  Airway Management Planned:   Additional Equipment:   Intra-op Plan:   Post-operative Plan:   Informed Consent: I have reviewed the patients History and Physical, chart, labs and discussed the procedure including the risks, benefits and alternatives for the proposed anesthesia with the patient or authorized representative who has indicated his/her understanding and acceptance.     Dental Advisory Given  Plan Discussed with: CRNA  Anesthesia Plan Comments:         Anesthesia Quick Evaluation

## 2021-10-26 NOTE — Interval H&P Note (Signed)
History and Physical Interval Note:  10/26/2021 12:11 PM  Whitney Bush  has presented today for surgery, with the diagnosis of EPIGASTRIC PAIN, H/O COLON ADENOMA.  The various methods of treatment have been discussed with the patient and family. After consideration of risks, benefits and other options for treatment, the patient has consented to  Procedure(s) with comments: COLONOSCOPY WITH PROPOFOL (N/A) - 12:15PM, ASA 3 ESOPHAGOGASTRODUODENOSCOPY (EGD) WITH PROPOFOL (N/A) as a surgical intervention.  The patient's history has been reviewed, patient examined, no change in status, stable for surgery.  I have reviewed the patient's chart and labs.  Questions were answered to the patient's satisfaction.     [name]     Persisting epigastric pain here for diagnostic EGD and a colonoscopy. The risks, benefits, limitations, imponderables and alternatives regarding both EGD and colonoscopy have been reviewed with the patient. Questions have been answered. All parties agreeable.

## 2021-10-27 ENCOUNTER — Encounter: Payer: Self-pay | Admitting: Internal Medicine

## 2021-10-27 LAB — SURGICAL PATHOLOGY

## 2021-10-27 NOTE — Anesthesia Postprocedure Evaluation (Signed)
Anesthesia Post Note  Patient: Whitney Bush  Procedure(s) Performed: COLONOSCOPY WITH PROPOFOL ESOPHAGOGASTRODUODENOSCOPY (EGD) WITH PROPOFOL BIOPSY  Patient location during evaluation: Phase II Anesthesia Type: General Level of consciousness: awake Pain management: pain level controlled Vital Signs Assessment: post-procedure vital signs reviewed and stable Respiratory status: spontaneous breathing and respiratory function stable Cardiovascular status: blood pressure returned to baseline and stable Postop Assessment: no headache and no apparent nausea or vomiting Anesthetic complications: no Comments: Late entry   No notable events documented.   Last Vitals:  Vitals:   10/26/21 1049 10/26/21 1251  BP: 128/80 90/78  Pulse: 84 (!) 112  Resp: 12 18  Temp: 36.9 C 36.6 C  SpO2: 100% 98%    Last Pain:  Vitals:   10/27/21 1034  TempSrc:   PainSc: 0-No pain                 Louann Sjogren

## 2021-11-02 NOTE — Op Note (Signed)
[facility] Patient Name: Whitney Bush Procedure Date: 10/26/2021 11:32 AM MRN: [id number] Date of Birth: [birth year] Attending MD: Norvel Richards , MD CSN: [id number] Age: 51 Admit Type: Outpatient Procedure:                Upper GI endoscopy Indications:              Epigastric abdominal pain Providers:                Norvel Richards, MD, Janeece Riggers, RN, Raphael Gibney, Technician Referring MD:              Medicines:                Propofol per Anesthesia Complications:            No immediate complications. Estimated Blood Loss:     Estimated blood loss was minimal. Procedure:                Pre-Anesthesia Assessment:                           - Prior to the procedure, a History and Physical                            was performed, and patient medications and                            allergies were reviewed. The patient's tolerance of                            previous anesthesia was also reviewed. The risks                            and benefits of the procedure and the sedation                            options and risks were discussed with the patient.                            All questions were answered, and informed consent                            was obtained. Prior Anticoagulants: The patient has                            taken no previous anticoagulant or antiplatelet                            agents. ASA Grade Assessment: III - A patient with                            severe systemic disease. After reviewing the risks  and benefits, the patient was deemed in                            satisfactory condition to undergo the procedure.                           After obtaining informed consent, the endoscope was                            passed under direct vision. Throughout the                            procedure, the patient's blood pressure, pulse, and                             oxygen saturations were monitored continuously. The                            [id number]-[id number] ([id number]) scope was introduced through the                            mouth, and advanced to the second part of duodenum. Scope In: 12:25:19 PM Scope Out: 12:30:31 PM Total Procedure Duration: 0 hours 5 minutes 12 seconds  Findings:      4 columns lobulated grade 2/3 varices without bleeding stigmata. Stomach       empty. Some snake skinning and patchy distribution throughout the       gastric mucosa with scattered erosions.. No gastric varices. No ulcer or       infiltrating process. Pylorus patent.      The duodenal bulb and second portion of the duodenum were normal.       Gastric biopsies taken for H. pylori testing. Impression:               - Grade 2/grade 3 esophageal varices without                            bleeding stigmata. Abnormal gastric mucosa                            consistent with portal gastropathy/gastric                            erosions. Normal duodenal bulb and second portion                            of the duodenum.                           Status post gastric biopsy. Moderate Sedation:      Moderate (conscious) sedation was personally administered by an       anesthesia professional. The following parameters were monitored: oxygen       saturation, heart rate, blood pressure, respiratory rate, EKG, adequacy       of pulmonary ventilation, and response to care. Recommendation:           - Patient has a  contact number available for                            emergencies. The signs and symptoms of potential                            delayed complications were discussed with the                            patient. Return to normal activities tomorrow.                            Written discharge instructions were provided to the                            patient.                           - Advance diet as tolerated. Follow-up with                            pathology.  Continue nonselective beta-blocker.                            Repeat EGD in 3 to 6 months. Consider EBL at that                            time. Procedure Code(s):        --- Professional ---                           [phone], Esophagogastroduodenoscopy, flexible,                            transoral; diagnostic, including collection of                            specimen(s) by brushing or washing, when performed                            (separate procedure) Diagnosis Code(s):        --- Professional ---                           R10.13, Epigastric pain CPT copyright 2019 American Medical Association. All rights reserved. The codes documented in this report are preliminary and upon coder review may  be revised to meet current compliance requirements. Cristopher Estimable. Redith Drach, MD Norvel Richards, MD 11/02/2021 9:28:04 AM This report has been signed electronically. Number of Addenda: 0

## 2021-11-03 ENCOUNTER — Encounter (HOSPITAL_COMMUNITY): Payer: Self-pay | Admitting: Internal Medicine

## 2021-11-03 ENCOUNTER — Other Ambulatory Visit (HOSPITAL_COMMUNITY): Payer: Self-pay | Admitting: Internal Medicine

## 2021-11-03 ENCOUNTER — Other Ambulatory Visit: Payer: Self-pay | Admitting: Internal Medicine

## 2021-11-03 DIAGNOSIS — Z87820 Personal history of traumatic brain injury: Secondary | ICD-10-CM

## 2022-03-15 ENCOUNTER — Other Ambulatory Visit: Payer: Self-pay | Admitting: Gastroenterology

## 2022-03-17 ENCOUNTER — Ambulatory Visit (INDEPENDENT_AMBULATORY_CARE_PROVIDER_SITE_OTHER): Payer: Self-pay | Admitting: Gastroenterology

## 2022-03-17 ENCOUNTER — Encounter: Payer: Self-pay | Admitting: *Deleted

## 2022-03-17 ENCOUNTER — Other Ambulatory Visit: Payer: Self-pay | Admitting: Gastroenterology

## 2022-03-17 ENCOUNTER — Encounter: Payer: Self-pay | Admitting: Gastroenterology

## 2022-03-17 VITALS — BP 113/75 | HR 80 | Temp 98.0°F | Ht 63.0 in | Wt 264.6 lb

## 2022-03-17 DIAGNOSIS — I8500 Esophageal varices without bleeding: Secondary | ICD-10-CM

## 2022-03-17 DIAGNOSIS — R1010 Upper abdominal pain, unspecified: Secondary | ICD-10-CM

## 2022-03-17 DIAGNOSIS — K7030 Alcoholic cirrhosis of liver without ascites: Secondary | ICD-10-CM

## 2022-03-17 DIAGNOSIS — K219 Gastro-esophageal reflux disease without esophagitis: Secondary | ICD-10-CM

## 2022-03-17 MED ORDER — PANTOPRAZOLE SODIUM 40 MG PO TBEC: 40.0000 mg | DELAYED_RELEASE_TABLET | Freq: Every day | ORAL | 3 refills | Status: DC

## 2022-03-17 NOTE — Patient Instructions (Addendum)
Please complete labs and MRI liver.  Start back on pantoprazole '40mg'$  daily before breakfast.  Upper endoscopy to be scheduled.  Refrain from alcohol use. Follow up with mental health as scheduled.  Please call [facility] at [phone] (Dr. Thersa Salt) [facility] at [phone] if you prefer new PCP.

## 2022-03-17 NOTE — Progress Notes (Signed)
GI Office Note    Referring Provider: Jerel Shepherd, FNP Primary Care Physician:  Jerel Shepherd, [place]  Primary Gastroenterologist: Garfield Cornea, MD   Chief Complaint   Chief Complaint  Patient presents with   Abdominal Pain    Rt side abdominal pain. Also states there are knots in her stomach and pain at the top of her stomach. Ran out of pantoprazole.     History of Present Illness   Whitney Bush is a 52 y.o. female presenting today for follow-up.  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   Patient states she has had a rough time. She is seeing mental health frequently for paranoid episodes. She has follow up next week. Has been started on seroquel but does not feel it is helping enough. She is now on Humira for Hidradenitis suppurativa. She has a lot of discomfort at site of her lesions. She has been having heartburn off PPI the last 3 months. Complains of upper abdominal pain not related to meals. Worse in LUQ. Worse with movement. BMs regular. No melena, brbpr.   Abdominal ultrasound January 2023: Suboptimal evaluation.  Nondistended gallbladder with mild wall thickening, favor wall edema.  Cirrhotic morphology of liver, no sonographic evidence of hepatoma.  EGD August 2023: -4 columns lobulated grade 2/3 varices without bleeding stigmata -Abnormal gastric mucosa consistent with portal gastropathy/gastric erosions. -Repeat EGD in 3 to 6 months, consider EBL at that time -gastric biopsy showed chemical/reactive gastropathy and focal evidence of erosion, no h.pylori  Colonoscopy August 2023: -Normal -Repeat colonoscopy in 5 years for surveillance personal history of colon polyps   Medications   Current Outpatient Medications  Medication Sig Dispense Refill   Adalimumab (HUMIRA PEN [place]) Inject into the skin every 30 (thirty) days.     escitalopram (LEXAPRO) 10 MG tablet Take 10 mg by mouth daily.     folic acid (FOLVITE) 1 MG  tablet Take 1 mg by mouth daily.     furosemide (LASIX) 40 MG tablet Take 40 mg by mouth daily.     ketoconazole (NIZORAL) 2 % cream Apply 1 application. topically daily. Applied to the affected areas once daily. (Patient taking differently: Apply 1 application  topically 2 (two) times daily. Applied to the affected areas once daily.) 30 g 1   lactulose (CONSTULOSE) 10 GM/15ML solution TAKE 30 MLS BY MOUTH TWICE DAILY AS NEEDED FOR MILD OR MODERATE CONSTIPATION 1659 mL 11   propranolol (INDERAL) 20 MG tablet Take 2 tablets (40 mg total) by mouth daily. 60 tablet 11   QUEtiapine (SEROQUEL) 50 MG tablet Take 100 mg by mouth at bedtime.     VENTOLIN HFA 108 (90 Base) MCG/ACT inhaler Inhale 2 puffs into the lungs every 6 (six) hours as needed for wheezing or shortness of breath.     pantoprazole (PROTONIX) 40 MG tablet Take 1 tablet (40 mg total) by mouth 2 (two) times daily. (Patient not taking: Reported on 03/17/2022) 60 tablet 3   No current facility-administered medications for this visit.    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   Anemia    Blood transfusion without reported diagnosis    Boil 06/04/2015   Chronic kidney disease    Clotting  disorder ([place])    COPD (chronic obstructive pulmonary disease) (HCC)    Depression    GERD (gastroesophageal reflux disease)    GI (gastrointestinal bleed) 07/28/2015   Heart disease    Heart murmur    Hyperlipidemia    Hypertension    Hypothyroidism    Migraines    OCD (obsessive compulsive disorder)    Peripheral edema    PTSD (post-traumatic stress disorder)    Tubular adenoma     Past Surgical History   Past Surgical History:  Procedure Laterality Date   ABCESS DRAINAGE     x5; neck, arm, chest, back   BIOPSY  02/24/2015   Procedure: BIOPSY;  Surgeon: Danie Binder, MD;  [facility];  Service: Endoscopy;;  gastric bx's   BIOPSY  12/25/2016   Procedure: BIOPSY;  Surgeon: Daneil Dolin, MD;  [facility];  Service: Endoscopy;;  gastric   BIOPSY  10/26/2021   Procedure: BIOPSY;  Surgeon: Daneil Dolin, MD;  [facility];  Service: Endoscopy;;   CESAREAN SECTION     COLONOSCOPY WITH PROPOFOL N/A 06/28/2015   Dr.Rourk- inadequate prep- One 6 mm polyp at the splenic flexure bx= tubular adenoma   COLONOSCOPY WITH PROPOFOL N/A 10/26/2021   Procedure: COLONOSCOPY WITH PROPOFOL;  Surgeon: Daneil Dolin, MD;  [facility];  Service: Endoscopy;  Laterality: N/A;  12:15PM, ASA 3   ESOPHAGEAL BANDING N/A 12/25/2016   Procedure: ESOPHAGEAL BANDING with Propofol;  Surgeon: Daneil Dolin, MD;  [facility];  Service: Endoscopy;  Laterality: N/A;  possible banding of varices   ESOPHAGOGASTRODUODENOSCOPY (EGD) WITH PROPOFOL N/A 02/24/2015   SLF: Grade II esophageal varices Moderate portal hypertensive gastropathy Mild erosive gastritis anemia likely due to many factors: gastritis, gastropathy, coagulopathy, chronic disease   ESOPHAGOGASTRODUODENOSCOPY (EGD) WITH PROPOFOL N/A 12/25/2016   Rourk: 2 columns of grade 2 esophageal varices, 1 column of grade 3.  Mild portal hypertensive gastropathy, biopsy showing reactive gastropathy but no H. pylori.  Esophagus dilated to 52 French   ESOPHAGOGASTRODUODENOSCOPY (EGD) WITH PROPOFOL N/A 10/26/2021   Procedure: ESOPHAGOGASTRODUODENOSCOPY (EGD) WITH PROPOFOL;  Surgeon: Daneil Dolin, MD;  [facility];  Service: Endoscopy;  Laterality: N/A;   KNEE ARTHROSCOPY WITH MEDIAL MENISECTOMY Left 01/09/2019   Procedure: KNEE ARTHROSCOPY WITH MEDIAL MENISCECTOMY AND LATERAL MENISCECTOMY;  Surgeon: Carole Civil, MD;  Location: [facility];  Service: Orthopedics;  Laterality: Left;   MALONEY DILATION N/A 12/25/2016   Procedure: Venia Minks DILATION;  Surgeon: Daneil Dolin, MD;  [facility];  Service: Endoscopy;  Laterality: N/A;   POLYPECTOMY  06/28/2015   Procedure: POLYPECTOMY;  Surgeon: Daneil Dolin, MD;  [facility];  Service: Endoscopy;;  at splenic flexure   SUPRACERVICAL ABDOMINAL HYSTERECTOMY N/A 10/02/2017   Procedure: HYSTERECTOMY SUPRACERVICAL ABDOMINAL;  Surgeon: Jonnie Kind, MD;  Location: [facility];  Service: Gynecology;  Laterality: N/A;    Past Family History   Family History  Problem Relation Age of Onset   Diabetes Father    Hyperlipidemia Father    Alcohol abuse Father    Hypertension Father    Cancer Other    Cervical cancer Maternal Grandmother    Lung cancer Maternal Grandfather    Alcohol abuse Other        multiple family members   Diabetes Sister    Hypertension Sister    Hypertension Brother    Colon cancer Neg Hx  Liver disease Neg Hx     Past Social History   Social History   Socioeconomic History   Marital status: Widowed    Spouse name: Not on file   Number of children: 1   Years of education: 39   Highest education level: Not on file  Occupational History   Occupation: unemployed  Tobacco Use   Smoking status: Former    Packs/day: 1.00    Years: 5.00    Total pack years: 5.00    Types: Cigarettes    Quit date: 12/29/2010    Years since quitting: 11.2   Smokeless tobacco: Former    Types: Snuff    Quit date: 09/28/2010  Vaping Use   Vaping Use: Never used  Substance and Sexual Activity   Alcohol use: Not Currently    Comment: socially   Drug use: No   Sexual activity: Yes    Birth control/protection: Surgical  Other Topics Concern   Not on file  Social History Narrative   Lives with parents   Social Determinants of Health   Financial Resource Strain: Not on file  Food Insecurity: Not on file  Transportation Needs: Not on file  Physical Activity: Not on file  Stress: Not on file  Social Connections: Not on file  Intimate Partner Violence: Not on file     Review of Systems   General: Negative for anorexia, weight loss, fever, chills, fatigue, weakness. ENT: Negative for hoarseness, difficulty swallowing , nasal congestion. CV: Negative for chest pain, angina, palpitations, dyspnea on exertion, peripheral edema.  Respiratory: Negative for dyspnea at rest, dyspnea on exertion, cough, sputum, wheezing.  GI: See history of present illness. GU:  Negative for dysuria, hematuria, urinary incontinence, urinary frequency, nocturnal urination.  Endo: Negative for unusual weight change.     Physical Exam   BP 113/75 (BP Location: Right Arm, Patient Position: Sitting, Cuff Size: Large)   Pulse 80   Temp 98 F (36.7 C) (Oral)   Ht '5\' 3"'$  (1.6 m)   Wt 264 lb 9.6 oz (120 kg)   LMP 09/12/2017 (Exact Date) Comment:    SpO2 97%   BMI 46.87 kg/m    General: Well-nourished, well-developed in no acute distress.  Eyes: No icterus. Mouth: Oropharyngeal mucosa moist and pink , no lesions erythema or exudate. Lungs: Clear to auscultation bilaterally.  Heart: Regular rate and rhythm, no murmurs rubs or gallops.  Abdomen: Bowel sounds are normal,  nondistended, no hepatosplenomegaly or masses,  no abdominal bruits or hernia , no rebound or guarding. Mild luq tenderness, noted mostly over left lower ribs. Rectal: not performed Extremities: No lower extremity edema. No clubbing or deformities. Neuro: Alert and oriented x 4   Skin: Warm and dry, no jaundice.   Psych: Alert and cooperative, normal mood and affect.  Labs   Lab Results  Component Value Date   CREATININE 0.78 10/24/2021   BUN 7 10/24/2021   NA 142 10/24/2021   K 3.7 10/24/2021   CL 106 10/24/2021   CO2 24 10/24/2021   Lab Results  Component Value Date   ALT 27 10/24/2021   AST 32 10/24/2021   ALKPHOS 88 10/24/2021   BILITOT 1.1 10/24/2021   Lab Results  Component Value Date   WBC 9.2 10/24/2021   HGB 13.5 10/24/2021   HCT 41.5 10/24/2021   MCV 92.4 10/24/2021   PLT 148  (L) 10/24/2021   Lab Results  Component Value Date   INR 1.2 10/24/2021   INR  1.1 06/01/2021   INR 1.1 03/04/2021    Imaging Studies   No results found.  Assessment   GERD: recurrent symptoms off PPI.  ETOH cirrhosis/esophageal varices: per Dr. Gala Romney, recommended EGD with esophageal variceal banding in 3-6 months, would be due at this time. Continue non-selective beta blocker. Encouraged etoh abstinence, recent relapse, sober for two weeks again. Due for labs and hepatoma screening. Given u/s imaging limited by body habitus, will obtain MRI this time.   Upper abdominal pain: unclear etiology, predominantly luq with movement more suspicious for musculoskeletal source. No postprandial pain. Evaluate at time of upcoming EGD and MRI.    PLAN   Complete labs and MRI liver.  Pantoprazole '40mg'$  daily. Refrain from etoh. EGD with esophageal variceal banding. ASA 3.  I have discussed the risks, alternatives, benefits with regards to but not limited to the risk of reaction to medication, bleeding, infection, perforation and the patient is agreeable to proceed. Written consent to be obtained. F/u with mental health as scheduled.  Patient interested in establishing care with PCP outside of the health department. Contact numbers of local PCPs accepting new patients provided.   Laureen Ochs. Bobby Rumpf, [place], [facility]

## 2022-03-20 ENCOUNTER — Encounter (INDEPENDENT_AMBULATORY_CARE_PROVIDER_SITE_OTHER): Payer: Self-pay | Admitting: *Deleted

## 2022-03-20 LAB — CBC WITH DIFFERENTIAL/PLATELET
Absolute Monocytes: 586 cells/uL (ref 200–950)
Basophils Absolute: 61 cells/uL (ref 0–200)
Basophils Relative: 0.6 %
Eosinophils Absolute: 71 cells/uL (ref 15–500)
Eosinophils Relative: 0.7 %
HCT: 36.5 % (ref 35.0–45.0)
Hemoglobin: 12.4 g/dL (ref 11.7–15.5)
Lymphs Abs: 2313 cells/uL (ref 850–3900)
MCH: 31.6 pg (ref 27.0–33.0)
MCHC: 34 g/dL (ref 32.0–36.0)
MCV: 92.9 fL (ref 80.0–100.0)
MPV: 10.9 fL (ref 7.5–12.5)
Monocytes Relative: 5.8 %
Neutro Abs: 7070 cells/uL (ref 1500–7800)
Neutrophils Relative %: 70 %
Platelets: 156 10*3/uL (ref 140–400)
RBC: 3.93 10*6/uL (ref 3.80–5.10)
RDW: 14.5 % (ref 11.0–15.0)
Total Lymphocyte: 22.9 %
WBC: 10.1 10*3/uL (ref 3.8–10.8)

## 2022-03-20 LAB — COMPLETE METABOLIC PANEL WITH GFR
AG Ratio: 0.9 (calc) — ABNORMAL LOW (ref 1.0–2.5)
ALT: 47 U/L — ABNORMAL HIGH (ref 6–29)
AST: 51 U/L — ABNORMAL HIGH (ref 10–35)
Albumin: 3.8 g/dL (ref 3.6–5.1)
Alkaline phosphatase (APISO): 86 U/L (ref 37–153)
BUN: 11 mg/dL (ref 7–25)
CO2: 30 mmol/L (ref 20–32)
Calcium: 9.5 mg/dL (ref 8.6–10.4)
Chloride: 100 mmol/L (ref 98–110)
Creat: 0.8 mg/dL (ref 0.50–1.03)
Globulin: 4.1 g/dL (calc) — ABNORMAL HIGH (ref 1.9–3.7)
Glucose, Bld: 81 mg/dL (ref 65–99)
Potassium: 3.8 mmol/L (ref 3.5–5.3)
Sodium: 140 mmol/L (ref 135–146)
Total Bilirubin: 1.3 mg/dL — ABNORMAL HIGH (ref 0.2–1.2)
Total Protein: 7.9 g/dL (ref 6.1–8.1)
eGFR: 90 mL/min/{1.73_m2} (ref >=60)

## 2022-03-20 LAB — PROTIME-INR
INR: 1.1
Prothrombin Time: 11.4 s (ref 9.0–11.5)

## 2022-03-20 LAB — AFP TUMOR MARKER: AFP-Tumor Marker: 2.8 ng/mL

## 2022-04-04 ENCOUNTER — Encounter (HOSPITAL_COMMUNITY)
Admission: RE | Admit: 2022-04-04 | Discharge: 2022-04-04 | Disposition: A | Discharge status: Home / Self Care | Payer: Medicaid Other | Source: Ambulatory Visit | Attending: Internal Medicine | Admitting: Internal Medicine

## 2022-04-04 ENCOUNTER — Telehealth (INDEPENDENT_AMBULATORY_CARE_PROVIDER_SITE_OTHER): Payer: Self-pay | Admitting: *Deleted

## 2022-04-04 NOTE — Telephone Encounter (Signed)
-----   Message from Josue Hector sent at 04/04/2022  8:42 AM EST ----- This patient LM to cancel procedure.  I have canceled PAT and pulled in the depot.  Thanks,

## 2022-04-04 NOTE — Telephone Encounter (Signed)
Called pt, LMOVM to call back. 

## 2022-04-05 ENCOUNTER — Ambulatory Visit (HOSPITAL_COMMUNITY): Admission: RE | Admit: 2022-04-05 | Payer: Medicaid Other | Source: Ambulatory Visit

## 2022-04-07 ENCOUNTER — Encounter (HOSPITAL_COMMUNITY): Admission: RE | Payer: Self-pay | Source: Home / Self Care

## 2022-04-07 ENCOUNTER — Ambulatory Visit (HOSPITAL_COMMUNITY): Admission: RE | Admit: 2022-04-07 | Payer: Medicaid Other | Source: Home / Self Care | Admitting: Internal Medicine

## 2022-04-07 SURGERY — ESOPHAGOGASTRODUODENOSCOPY (EGD) WITH PROPOFOL
Anesthesia: Monitor Anesthesia Care

## 2022-07-26 ENCOUNTER — Other Ambulatory Visit: Payer: Self-pay | Admitting: Internal Medicine

## 2022-11-19 ENCOUNTER — Other Ambulatory Visit: Payer: Self-pay | Admitting: Internal Medicine

## 2022-12-29 ENCOUNTER — Other Ambulatory Visit: Payer: Self-pay | Admitting: Gastroenterology

## 2023-01-01 NOTE — Telephone Encounter (Signed)
Overdue for follow up cirrhosis.please arrange. Rx sent.

## 2023-01-04 ENCOUNTER — Other Ambulatory Visit: Payer: Self-pay

## 2023-01-04 ENCOUNTER — Encounter (HOSPITAL_COMMUNITY): Payer: Self-pay | Admitting: Radiology

## 2023-01-04 ENCOUNTER — Emergency Department (HOSPITAL_COMMUNITY)
Admission: EM | Admit: 2023-01-04 | Discharge: 2023-01-04 | Disposition: A | Discharge status: Home / Self Care | Payer: MEDICAID | Attending: Emergency Medicine | Admitting: Emergency Medicine

## 2023-01-04 DIAGNOSIS — I129 Hypertensive chronic kidney disease with stage 1 through stage 4 chronic kidney disease, or unspecified chronic kidney disease: Secondary | ICD-10-CM | POA: Diagnosis not present

## 2023-01-04 DIAGNOSIS — L732 Hidradenitis suppurativa: Secondary | ICD-10-CM | POA: Diagnosis present

## 2023-01-04 DIAGNOSIS — J449 Chronic obstructive pulmonary disease, unspecified: Secondary | ICD-10-CM | POA: Diagnosis not present

## 2023-01-04 DIAGNOSIS — N189 Chronic kidney disease, unspecified: Secondary | ICD-10-CM | POA: Insufficient documentation

## 2023-01-04 MED ORDER — DOXYCYCLINE HYCLATE 100 MG PO TABS
100.0000 mg | ORAL_TABLET | Freq: Once | ORAL | Status: AC
  Administered 2023-01-04: 100 mg via ORAL
  Filled 2023-01-04: qty 1

## 2023-01-04 MED ORDER — DOXYCYCLINE HYCLATE 100 MG PO CAPS: 100.0000 mg | ORAL_CAPSULE | Freq: Two times a day (BID) | ORAL | 0 refills | Status: DC

## 2023-01-04 NOTE — ED Notes (Signed)
HS flares x3-4 weeks

## 2023-01-04 NOTE — ED Provider Notes (Signed)
[place] EM[facility]GENC[facility] DE[facility]MENT AT [facility] Provider Note   CSN: [id number] Arrival date & time: 01/04/23  1343     History  Chief Complaint  Patient presents with   Hydradenitis Flare    Whitney Bush is a 52 y.o. female.  HPI     Whitney Bush is a 52 y.o. female past medical history of COPD, chronic kidney disease, hypertension, hydradenitis who presents to the Emergency Department requesting prescription for antibiotics for her hidradenitis.  She states that she is having a "hidradenitis flare."  She describes having painful boils to her right neck, groin area, and right mid back.  Area to her groin is draining clear fluid.  History similar symptoms.  Missed her last appointment with her dermatologist, she is here requesting prescription for antibiotics until she can follow-up with her dermatologist.  She denies any nausea, vomiting, abdominal pain or chest pain.  Home Medications Prior to Admission medications   Medication Sig Start Date End Date Taking? Authorizing Provider  Adalimumab (HUMIRA PEN [place]) Inject into the skin every 30 (thirty) days.    [provider]  escitalopram (LEXAPRO) 10 MG tablet Take 10 mg by mouth daily. 02/21/22   [provider]  folic acid (FOLVITE) 1 MG tablet Take 1 mg by mouth daily. 03/07/22   [provider]  furosemide (LASIX) 40 MG tablet Take 1 tablet by mouth once daily 01/01/23   Tiffany Kocher, PA-C  ketoconazole (NIZORAL) 2 % cream Apply 1 application. topically daily. Applied to the affected areas once daily. Patient taking differently: Apply 1 application  topically 2 (two) times daily. Applied to the affected areas once daily. 05/09/21   Romilda Proby, PA-C  lactulose (CONSTULOSE) 10 GM/15ML solution TAKE 30 MLS BY MOUTH TWICE DAILY AS NEEDED FOR MILD OR MODERATE CONSTIPATION 03/15/22   Rourk, Gerrit Friends, MD  pantoprazole (PROTONIX) 40 MG tablet Take 1 tablet (40 mg total) by mouth daily before  breakfast. 03/17/22   Tiffany Kocher, PA-C  propranolol (INDERAL) 20 MG tablet Take 2 tablets by mouth once daily 11/20/22   Tiffany Kocher, PA-C  QUEtiapine (SEROQUEL) 50 MG tablet Take 100 mg by mouth at bedtime. 02/01/22   [provider]  VENTOLIN HFA 108 (90 Base) MCG/ACT inhaler Inhale 2 puffs into the lungs every 6 (six) hours as needed for wheezing or shortness of breath. 08/25/21   [provider]      Allergies    Patient has no known allergies.    Review of Systems   Review of Systems  Constitutional:  Negative for appetite change, chills and fever.  Respiratory:  Negative for shortness of breath.   Cardiovascular:  Negative for chest pain.  Gastrointestinal:  Negative for abdominal pain, nausea and vomiting.  Genitourinary:  Negative for dysuria.  Skin:        Tender areas right neck, right mid back, groin.  History of hydradenitis  Neurological:  Negative for weakness and headaches.    Physical Exam Updated Vital Signs BP (!) 154/103   Pulse (!) 110   Temp 98.3 F (36.8 C) (Oral)   Resp 17   Ht 5\' 3"  (1.6 m)   Wt 119.7 kg   LMP 09/12/2017 (Exact Date) Comment:    SpO2 95%   BMI 46.77 kg/m  Physical Exam Vitals and nursing note reviewed.  Constitutional:      General: She is not in acute distress.    Appearance: Normal appearance. She is  not ill-appearing or toxic-appearing.  HENT:     Mouth/Throat:     Mouth: Mucous membranes are moist.  Cardiovascular:     Rate and Rhythm: Normal rate and regular rhythm.     Pulses: Normal pulses.  Pulmonary:     Effort: Pulmonary effort is normal.  Abdominal:     Palpations: Abdomen is soft.     Tenderness: There is no abdominal tenderness.  Skin:    General: Skin is warm.     Capillary Refill: Capillary refill takes less than 2 seconds.     Comments: Multiple tender areas of the right mid back, base of right neck.  No surrounding erythema I do not appreciate any active drainage.  These areas are  very tender to palpation.  No lymphangitis  Neurological:     General: No focal deficit present.     Mental Status: She is alert.     Sensory: No sensory deficit.     Motor: No weakness.     ED Results / Procedures / Treatments   Labs (all labs ordered are listed, but only abnormal results are displayed) Labs Reviewed - No data to display  EKG None  Radiology No results found.  Procedures Procedures    Medications Ordered in ED Medications  doxycycline (VIBRA-TABS) tablet 100 mg (has no administration in time range)    ED Course/ Medical Decision Making/ A&P                                 Medical Decision Making Patient with history of hidradenitis suppurativa.  Previous ER visits for same.  Is followed by dermatology and states that she gets steroid injections to these areas but missed her most recent dermatology appointment.  Here requesting prescription for antibiotics until she can see her dermatologist.  Denies any fever chills nausea vomiting.  Patient has multiple chronic appearing, tender slightly fluctuant areas right neck right mid back and groin.  No purulent drainage noted.  Nontoxic-appearing  Amount and/or Complexity of Data Reviewed Discussion of management or test interpretation with external provider(s): Patient has history of same.  I have offered labs and drainage of these areas.  Patient declined.  States that she will follow-up with her dermatologist.  States she typically gets improvement after taking antibiotics has not had antibiotics in some time.  Has also recently ran out of her Humira.  I have counseled on the importance of follow-up with her dermatologist for ongoing management of her symptoms.  Agreeable to plan.  Risk Prescription drug management.           Final Clinical Impression(s) / ED Diagnoses Final diagnoses:  Hidradenitis suppurativa    Rx / DC Orders ED Discharge Orders     None         Pauline Aus,  PA-C 01/04/23 1430    Eber Hong, MD 01/05/23 2021

## 2023-01-04 NOTE — ED Triage Notes (Signed)
Pt states that she is having a flare of her HS that started about 3 weeks ago. Pt is being seen by dermatology for this and takes meds. Right neck, right flank and lower abd.

## 2023-01-04 NOTE — Discharge Instructions (Signed)
It is very important for you to try to maintain a healthy diet, drink plenty of water.  Take the antibiotic as directed.  Please keep your appointment with your dermatologist for follow-up.

## 2023-04-09 ENCOUNTER — Other Ambulatory Visit: Payer: Self-pay | Admitting: Gastroenterology

## 2023-04-09 NOTE — Telephone Encounter (Signed)
Due ov.  

## 2023-04-12 ENCOUNTER — Encounter: Payer: Self-pay | Admitting: Gastroenterology

## 2023-04-12 NOTE — Telephone Encounter (Signed)
No working numbers for the patient so I mailed a letter asking her to contact the office to schedule an office visit

## 2023-08-09 ENCOUNTER — Other Ambulatory Visit: Payer: Self-pay | Admitting: Internal Medicine

## 2023-08-09 ENCOUNTER — Telehealth: Payer: Self-pay

## 2023-08-09 NOTE — Telephone Encounter (Signed)
She is overdue for ov.

## 2023-08-09 NOTE — Telephone Encounter (Signed)
Please arrange f/u.

## 2023-08-09 NOTE — Telephone Encounter (Signed)
 Pt called lmom requesting someone to call her back to schedule an appt. Pt received recall letter in the mail.

## 2023-08-13 NOTE — Telephone Encounter (Signed)
 Mail the pt a letter requesting her to contact the office to schedule an appointment.

## 2023-10-06 NOTE — Progress Notes (Signed)
 GI Office Note    Referring Provider: No ref. provider found Primary Care Physician:  Patient, No Pcp Per  Primary Gastroenterologist: Ozell Hollingshead, MD   Chief Complaint   Chief Complaint  Patient presents with   Follow-up    Concerned about fluid retention/weight gain    History of Present Illness   Whitney Bush is a 53 y.o. female presenting today for follow up. Last seen 02/2022. 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   She did not complete EGD or MRI liver (planned for hepatoma screening due to u/s limited due to body habitus) after last ov as planned. MELD Na 8 in 02/2022. Overdue for labs and hepatoma screening.   Wt Readings from Last 3 Encounters:  10/08/23 284 lb 9.6 oz (129.1 kg)  01/04/23 264 lb (119.7 kg)  03/17/22 264 lb 9.6 oz (120 kg)   She reports ongoing issues with paranoid episodes. She has been followed by health dept and [facility]. A lot of her concerns stem from concerns for her cousins talking about her, bullying her, making fun of her. She states her daughter does not help her with regards to these matters. She states her daughter does not believe what she says and believes it is due to her drinking and misperception. She states her daughter believes she is having auditory hallucinations due etoh use. Patient states she was in jail for one month, charged with threatening someone but patient denies it happened. Patient feels that she is being mocked by family members and that they may be eavesdropping using a phone app. She continues to follow with mental health.    She is currently on Cosentyx for hidradenitis suppurativa, which she administers monthly, and uses an antibiotic wash wipe for acne.   She is also on furosemide  and spironolactone  for fluid management, which she took recently but notes that she has not been taking regularly due to being out of refills. She reports infrequent bowel movements. She is not using lactulose .  No melena, brbpr. No abdominal pain. Some heartburn and indigestion occasionally.   She has a history of alcohol use but does not drink regularly, with the last significant intake occurring a couple of weeks ago at a birthday party. She denies current regular alcohol consumption, stating she only drinks occasionally during holidays. But she notes at times she does drink significant amounts.       Prior Data   Abdominal ultrasound January 2023: Suboptimal evaluation.  Nondistended gallbladder with mild wall thickening, favor wall edema.  Cirrhotic morphology of liver, no sonographic evidence of hepatoma.   EGD August 2023: -4 columns lobulated grade 2/3 varices without bleeding stigmata -Abnormal gastric mucosa consistent with portal gastropathy/gastric erosions. -Repeat EGD in 3 to 6 months, consider EBL at that time -gastric biopsy showed chemical/reactive gastropathy and focal evidence of erosion, no h.pylori   Colonoscopy August 2023: -Normal -Repeat colonoscopy in 5 years for surveillance personal history of colon polyps   Medications   Current Outpatient Medications  Medication Sig Dispense Refill   clindamycin  (CLEOCIN  T) 1 % SWAB Apply topically.     pantoprazole  (PROTONIX ) 40 MG tablet Take 1 tablet (40 mg total) by mouth daily before breakfast. 90 tablet 3   QUEtiapine (SEROQUEL) 50 MG tablet Take 100 mg by mouth at bedtime.     Secukinumab (COSENTYX, 300 MG DOSE, [place]) Inject 300 mg into the skin every 30 (thirty) days.     spironolactone  (ALDACTONE ) 100 MG tablet Take  1 tablet (100 mg total) by mouth daily. 30 tablet 5   VENTOLIN  HFA 108 (90 Base) MCG/ACT inhaler Inhale 2 puffs into the lungs every 6 (six) hours as needed for wheezing or shortness of breath.     escitalopram (LEXAPRO) 10 MG tablet Take 10 mg by mouth daily. (Patient not taking: Reported on 10/08/2023)     folic acid  (FOLVITE ) 1 MG tablet Take 1 mg by mouth daily. (Patient not taking: Reported on 10/08/2023)      furosemide  (LASIX ) 40 MG tablet Take 1 tablet (40 mg total) by mouth daily. 30 tablet 5   lactulose  (CHRONULAC ) 10 GM/15ML solution TAKE 30MLS BY MOUTH TWICE DAILY. Goal of 2-3 soft stools daily. 1800 mL 5   propranolol  (INDERAL ) 20 MG tablet Take 1 tablet (20 mg total) by mouth 2 (two) times daily. 60 tablet 5   No current facility-administered medications for this visit.    Allergies   Allergies as of 10/08/2023   (No Known Allergies)     Past Medical History   Past Medical History:  Diagnosis Date   Alcohol abuse    Alcoholic cirrhosis of liver (HCC)    immune to Hep A and Hep B   Anemia    Blood transfusion without reported diagnosis    Boil 06/04/2015   Chronic kidney disease    Clotting disorder (HCC)    COPD (chronic obstructive pulmonary disease) (HCC)    Depression    GERD (gastroesophageal reflux disease)    GI (gastrointestinal bleed) 07/28/2015   Heart disease    Heart murmur    Hyperlipidemia    Hypertension    Hypothyroidism    Migraines    Nausea with vomiting    OCD (obsessive compulsive disorder)    Peripheral edema    PTSD (post-traumatic stress disorder)    Tubular adenoma     Past Surgical History   Past Surgical History:  Procedure Laterality Date   ABCESS DRAINAGE     x5; neck, arm, chest, back   BIOPSY  02/24/2015   Procedure: BIOPSY;  Surgeon: Margo LITTIE Haddock, MD;  [facility];  Service: Endoscopy;;  gastric bx's   BIOPSY  12/25/2016   Procedure: BIOPSY;  Surgeon: Shaaron Lamar HERO, MD;  [facility];  Service: Endoscopy;;  gastric   BIOPSY  10/26/2021   Procedure: BIOPSY;  Surgeon: Shaaron Lamar HERO, MD;  [facility];  Service: Endoscopy;;   CESAREAN SECTION     COLONOSCOPY WITH PROPOFOL  N/A 06/28/2015   Dr.Rourk- inadequate prep- One 6 mm polyp at the splenic flexure bx= tubular adenoma   COLONOSCOPY WITH PROPOFOL  N/A 10/26/2021   Procedure: COLONOSCOPY WITH PROPOFOL ;  Surgeon: Shaaron Lamar HERO, MD;   [facility];  Service: Endoscopy;  Laterality: N/A;  12:15PM, ASA 3   ESOPHAGEAL BANDING N/A 12/25/2016   Procedure: ESOPHAGEAL BANDING with Propofol ;  Surgeon: Shaaron Lamar HERO, MD;  [facility];  Service: Endoscopy;  Laterality: N/A;  possible banding of varices   ESOPHAGOGASTRODUODENOSCOPY (EGD) WITH PROPOFOL  N/A 02/24/2015   SLF: Grade II esophageal varices Moderate portal hypertensive gastropathy Mild erosive gastritis anemia likely due to many factors: gastritis, gastropathy, coagulopathy, chronic disease   ESOPHAGOGASTRODUODENOSCOPY (EGD) WITH PROPOFOL  N/A 12/25/2016   Rourk: 2 columns of grade 2 esophageal varices, 1 column of grade 3.  Mild portal hypertensive gastropathy, biopsy showing reactive gastropathy but no H. pylori.  Esophagus dilated to 4 French   ESOPHAGOGASTRODUODENOSCOPY (EGD) WITH PROPOFOL   N/A 10/26/2021   Procedure: ESOPHAGOGASTRODUODENOSCOPY (EGD) WITH PROPOFOL ;  Surgeon: Shaaron Lamar HERO, MD;  [facility];  Service: Endoscopy;  Laterality: N/A;   KNEE ARTHROSCOPY WITH MEDIAL MENISECTOMY Left 01/09/2019   Procedure: KNEE ARTHROSCOPY WITH MEDIAL MENISCECTOMY AND LATERAL MENISCECTOMY;  Surgeon: Margrette Taft BRAVO, MD;  Location: [facility];  Service: Orthopedics;  Laterality: Left;   MALONEY DILATION N/A 12/25/2016   Procedure: AGAPITO DILATION;  Surgeon: Shaaron Lamar HERO, MD;  [facility];  Service: Endoscopy;  Laterality: N/A;   POLYPECTOMY  06/28/2015   Procedure: POLYPECTOMY;  Surgeon: Lamar HERO Shaaron, MD;  [facility];  Service: Endoscopy;;  at splenic flexure   SUPRACERVICAL ABDOMINAL HYSTERECTOMY N/A 10/02/2017   Procedure: HYSTERECTOMY SUPRACERVICAL ABDOMINAL;  Surgeon: Edsel Norleen GAILS, MD;  Location: [facility];  Service: Gynecology;  Laterality: N/A;   VAGINAL HYSTERECTOMY      Past Family History   Family History  Problem Relation Age of Onset   Diabetes Father    Hyperlipidemia Father    Alcohol abuse  Father    Hypertension Father    Cancer Other    Cervical cancer Maternal Grandmother    Lung cancer Maternal Grandfather    Alcohol abuse Other        multiple family members   Diabetes Sister    Hypertension Sister    Hypertension Brother    Colon cancer Neg Hx    Liver disease Neg Hx     Past Social History   Social History   Socioeconomic History   Marital status: Widowed    Spouse name: Not on file   Number of children: 1   Years of education: 31   Highest education level: Not on file  Occupational History   Occupation: unemployed  Tobacco Use   Smoking status: Former    Current packs/day: 0.00    Average packs/day: 1 pack/day for 5.0 years (5.0 ttl pk-yrs)    Types: Cigarettes    Start date: 12/28/2005    Quit date: 12/29/2010    Years since quitting: 12.7   Smokeless tobacco: Former    Types: Snuff    Quit date: 09/28/2010  Vaping Use   Vaping status: Never Used  Substance and Sexual Activity   Alcohol use: Not Currently    Comment: socially   Drug use: No   Sexual activity: Yes    Birth control/protection: Surgical  Other Topics Concern   Not on file  Social History Narrative   Lives with parents   Social Drivers of Health   Financial Resource Strain: Not on file  Food Insecurity: Not on file  Transportation Needs: Not on file  Physical Activity: Not on file  Stress: Not on file  Social Connections: Not on file  Intimate Partner Violence: Not on file    Review of Systems   General: Negative for anorexia, weight loss, fever, chills, fatigue, weakness. ENT: Negative for hoarseness, difficulty swallowing , nasal congestion. CV: Negative for chest pain, angina, palpitations, dyspnea on exertion, +peripheral edema.  Respiratory: Negative for dyspnea at rest, dyspnea on exertion, cough, sputum, wheezing.  GI: See history of present illness. GU:  Negative for dysuria, hematuria, urinary incontinence, urinary frequency, nocturnal urination.  Endo:  Negative for unusual weight change.     Physical Exam   BP 135/78 (BP Location: Right Arm, Patient Position: Sitting, Cuff Size: Normal) Comment (BP Location): Taken on lower right forearm  Pulse (!) 111   Temp 99.1 F (37.3  C) (Oral)   Ht 5' 4 (1.626 m)   Wt 284 lb 9.6 oz (129.1 kg)   LMP 09/12/2017 (Exact Date) Comment:    SpO2 95%   BMI 48.85 kg/m    General: Well-nourished, well-developed in no acute distress. Tearful. Eyes: No icterus. Mouth: Oropharyngeal mucosa moist and pink   Lungs: Clear to auscultation bilaterally.  Heart: Regular rate and rhythm, no murmurs rubs or gallops.  Abdomen: Bowel sounds are normal, nontender, nondistended, no hepatosplenomegaly or masses,  no abdominal bruits or hernia , no rebound or guarding. Exam limited by body habitus Rectal: not performed Extremities: 2+ bilateral lower extremity edema. No clubbing or deformities. Neuro: Alert and oriented x 4   Skin: Warm and dry, no jaundice.   Psych: Alert and cooperative, normal mood and affect.  Labs   Lab Results  Component Value Date   NA 140 03/17/2022   CL 100 03/17/2022   K 3.8 03/17/2022   CO2 30 03/17/2022   BUN 11 03/17/2022   CREATININE 0.80 03/17/2022   EGFR 90 03/17/2022   CALCIUM 9.5 03/17/2022   ALBUMIN  3.5 10/24/2021   GLUCOSE 81 03/17/2022   Lab Results  Component Value Date   ALT 47 (H) 03/17/2022   AST 51 (H) 03/17/2022   ALKPHOS 88 10/24/2021   BILITOT 1.3 (H) 03/17/2022   Lab Results  Component Value Date   WBC 10.1 03/17/2022   HGB 12.4 03/17/2022   HCT 36.5 03/17/2022   MCV 92.9 03/17/2022   PLT 156 03/17/2022   Lab Results  Component Value Date   INR 1.1 03/17/2022   INR 1.2 10/24/2021   INR 1.1 06/01/2021   AFP 2.8 on 03/17/2022.  Imaging Studies   No results found.  Assessment/Plan:   Assessment & Plan  ETOH Cirrhosis complicated by esophageal varices and LE edema: -overdue for labs and hepatoma screening -overdue for surveillance EGD  for possible esophageal variceal banding -currently not taking her diuretics or NSBB on regular basis increased her LE edema and risk of GI bleeding -encouraged complete etoh abstinence -Plan for labs, including ammonia level given mental status concerns -MRI liver with and without contrast -resume lasix  40mg  daily, spironolactone  100mg  daily -resume propranolol  20mg  BID   -EGD with possible esophageal variceal banding to be scheduled. ASA 3.  I have discussed the risks, alternatives, benefits with regards to but not limited to the risk of reaction to medication, bleeding, infection, perforation and the patient is agreeable to proceed. Written consent to be obtained.   Constipation Infrequent bowel movements contributing to concerns for possible elevated ammonia levels as a cause for mental status concerns although symptoms could be due to baseline psychiatric disease.   -add lactulose , titrate to 2-3 soft stools daily   GERD: -some reflux symptoms -resume pantoprazole  40mg  daily    Sonny RAMAN. Ezzard, MHS, PA-C [facility]

## 2023-10-08 ENCOUNTER — Ambulatory Visit (INDEPENDENT_AMBULATORY_CARE_PROVIDER_SITE_OTHER): Payer: MEDICAID | Admitting: Gastroenterology

## 2023-10-08 ENCOUNTER — Telehealth: Payer: Self-pay | Admitting: Gastroenterology

## 2023-10-08 ENCOUNTER — Encounter: Payer: Self-pay | Admitting: Gastroenterology

## 2023-10-08 VITALS — BP 135/78 | HR 111 | Temp 99.1°F | Ht 64.0 in | Wt 284.6 lb

## 2023-10-08 DIAGNOSIS — K5900 Constipation, unspecified: Secondary | ICD-10-CM | POA: Diagnosis not present

## 2023-10-08 DIAGNOSIS — K7030 Alcoholic cirrhosis of liver without ascites: Secondary | ICD-10-CM

## 2023-10-08 DIAGNOSIS — F1090 Alcohol use, unspecified, uncomplicated: Secondary | ICD-10-CM | POA: Diagnosis not present

## 2023-10-08 DIAGNOSIS — I8500 Esophageal varices without bleeding: Secondary | ICD-10-CM | POA: Diagnosis not present

## 2023-10-08 DIAGNOSIS — K219 Gastro-esophageal reflux disease without esophagitis: Secondary | ICD-10-CM

## 2023-10-08 DIAGNOSIS — R600 Localized edema: Secondary | ICD-10-CM

## 2023-10-08 MED ORDER — FUROSEMIDE 40 MG PO TABS: 40.0000 mg | ORAL_TABLET | Freq: Every day | ORAL | 5 refills | Status: AC

## 2023-10-08 MED ORDER — PANTOPRAZOLE SODIUM 40 MG PO TBEC: 40.0000 mg | DELAYED_RELEASE_TABLET | Freq: Every day | ORAL | 3 refills | Status: AC

## 2023-10-08 MED ORDER — SPIRONOLACTONE 100 MG PO TABS: 100.0000 mg | ORAL_TABLET | Freq: Every day | ORAL | 5 refills | Status: AC

## 2023-10-08 MED ORDER — PROPRANOLOL HCL 20 MG PO TABS: 20.0000 mg | ORAL_TABLET | Freq: Two times a day (BID) | ORAL | 5 refills | Status: AC

## 2023-10-08 MED ORDER — LACTULOSE 10 GM/15ML PO SOLN: ORAL | 5 refills | Status: AC

## 2023-10-08 NOTE — Telephone Encounter (Signed)
 Discussed with patient today at ov.   Please schedule egd with possible esophageal variceal banding Dx: cirrhosis and history of esphageal varices.  ASA 3.   Please let pt know that I also sent in rx for pantoprazole  to restart 40mg  once daily for acid reflux/gastritis

## 2023-10-08 NOTE — Patient Instructions (Addendum)
 VISIT SUMMARY:  During your visit, we discussed your ongoing issues with medication management, auditory hallucinations, and other health concerns. We reviewed your current medications and addressed the impact of your symptoms on your daily life. We also discussed your liver health, hidradenitis suppurativa, and constipation.  YOUR PLAN:  -CIRRHOSIS OF LIVER WITH LOWER EXTREMITY EDEMA: Cirrhosis is a condition where the liver is scarred and doesn't function properly. You have swelling in your lower legs, which may be related to this condition. We will check your liver function and ammonia levels with blood tests, and we will also perform an MRI to get a better look at your liver. Your liver medications, propranolol  (controls heart rate and decreasing change of bleeding from esophageal varices,  spironolactone  and furosemide  (fluid pills) will be sent to [facility] in [place].   -CONSTIPATION: Constipation is when you have infrequent bowel movements. This can affect your ammonia levels and potentially contribute to your symptoms. We will evaluate your ammonia levels to see if this is impacting your health. We will start you back on lactulose  with goal of 2-3 soft stools daily.  INSTRUCTIONS:  Please follow up with the recommended blood tests, MRI, and upper endoscopy to be scheduled.   Please contact [facility] at [phone] to request establishing with a Primary Care Provider. You can discuss weight loss management, insulin resistance/prediabetes.                                                  Contains text generated by [employer]bridge.                                  Contains text generated by Abridge.

## 2023-10-09 ENCOUNTER — Telehealth: Payer: Self-pay | Admitting: *Deleted

## 2023-10-09 NOTE — Telephone Encounter (Signed)
 CPT Code: 25816 Description: MRI ABDOMEN W & W/O CONTRAST Authorization Number: [id number] Case Number: [id number] Review Date: 10/09/2023 7:25:04 AM Expiration Date: 11/08/2023 Status: Your case has been Approved. The prior authorization you submitted, Case [id number], has been received. Additional case status notifications will be sent if you opted in for email notifications. Thank you.

## 2023-10-09 NOTE — Telephone Encounter (Signed)
 Left message notifying pt.

## 2023-10-09 NOTE — Telephone Encounter (Signed)
 Spoke with pt. Aware of MRI appt 8/22, arrival 12pm, npo 4 hrs prior. She voiced understanding

## 2023-10-10 LAB — CBC WITH DIFFERENTIAL/PLATELET
Basophils Absolute: 0.1 x10E3/uL (ref 0.0–0.2)
Basos: 1 %
EOS (ABSOLUTE): 0.1 x10E3/uL (ref 0.0–0.4)
Eos: 1 %
Hematocrit: 40.8 % (ref 34.0–46.6)
Hemoglobin: 13.3 g/dL (ref 11.1–15.9)
Immature Grans (Abs): 0 x10E3/uL (ref 0.0–0.1)
Immature Granulocytes: 0 %
Lymphocytes Absolute: 2.9 x10E3/uL (ref 0.7–3.1)
Lymphs: 27 %
MCH: 29.8 pg (ref 26.6–33.0)
MCHC: 32.6 g/dL (ref 31.5–35.7)
MCV: 91 fL (ref 79–97)
Monocytes Absolute: 0.6 x10E3/uL (ref 0.1–0.9)
Monocytes: 5 %
Neutrophils Absolute: 7.1 x10E3/uL — ABNORMAL HIGH (ref 1.4–7.0)
Neutrophils: 66 %
Platelets: 146 x10E3/uL — ABNORMAL LOW (ref 150–450)
RBC: 4.47 x10E6/uL (ref 3.77–5.28)
RDW: 14.7 % (ref 11.7–15.4)
WBC: 10.7 x10E3/uL (ref 3.4–10.8)

## 2023-10-10 LAB — COMPREHENSIVE METABOLIC PANEL WITH GFR
ALT: 44 IU/L — ABNORMAL HIGH (ref 0–32)
AST: 57 IU/L — ABNORMAL HIGH (ref 0–40)
Albumin: 3.9 g/dL (ref 3.8–4.9)
Alkaline Phosphatase: 98 IU/L (ref 44–121)
BUN/Creatinine Ratio: 11 (ref 9–23)
BUN: 9 mg/dL (ref 6–24)
Bilirubin Total: 0.5 mg/dL (ref 0.0–1.2)
CO2: 23 mmol/L (ref 20–29)
Calcium: 9.5 mg/dL (ref 8.7–10.2)
Chloride: 100 mmol/L (ref 96–106)
Creatinine, Ser: 0.83 mg/dL (ref 0.57–1.00)
Globulin, Total: 3.8 g/dL (ref 1.5–4.5)
Glucose: 84 mg/dL (ref 70–99)
Potassium: 3.9 mmol/L (ref 3.5–5.2)
Sodium: 140 mmol/L (ref 134–144)
Total Protein: 7.7 g/dL (ref 6.0–8.5)
eGFR: 85 mL/min/1.73 (ref >59)

## 2023-10-10 LAB — PROTIME-INR
INR: 1 (ref 0.9–1.2)
Prothrombin Time: 11.2 s (ref 9.1–12.0)

## 2023-10-10 LAB — AFP TUMOR MARKER: AFP, Serum, Tumor Marker: 2.3 ng/mL (ref 0.0–9.2)

## 2023-10-10 LAB — AMMONIA: Ammonia: 106 ug/dL (ref 34–178)

## 2023-10-16 ENCOUNTER — Encounter: Payer: Self-pay | Admitting: *Deleted

## 2023-10-16 NOTE — Telephone Encounter (Signed)
 Pt has been scheduled for 11/14/23. Instructions mailed

## 2023-10-19 ENCOUNTER — Ambulatory Visit (HOSPITAL_COMMUNITY): Admission: RE | Admit: 2023-10-19 | Payer: MEDICAID | Source: Ambulatory Visit

## 2023-10-21 ENCOUNTER — Ambulatory Visit: Payer: Self-pay | Admitting: Gastroenterology

## 2023-10-21 DIAGNOSIS — K7030 Alcoholic cirrhosis of liver without ascites: Secondary | ICD-10-CM

## 2023-10-23 ENCOUNTER — Other Ambulatory Visit: Payer: Self-pay

## 2023-10-23 DIAGNOSIS — K7030 Alcoholic cirrhosis of liver without ascites: Secondary | ICD-10-CM

## 2023-11-08 ENCOUNTER — Other Ambulatory Visit: Payer: Self-pay

## 2023-11-08 DIAGNOSIS — K7030 Alcoholic cirrhosis of liver without ascites: Secondary | ICD-10-CM

## 2023-11-09 ENCOUNTER — Emergency Department (HOSPITAL_COMMUNITY): Payer: MEDICAID

## 2023-11-09 ENCOUNTER — Other Ambulatory Visit: Payer: Self-pay

## 2023-11-09 ENCOUNTER — Encounter (HOSPITAL_COMMUNITY): Payer: Self-pay | Admitting: Emergency Medicine

## 2023-11-09 ENCOUNTER — Encounter (HOSPITAL_COMMUNITY)
Admission: RE | Admit: 2023-11-09 | Discharge: 2023-11-09 | Disposition: A | Discharge status: Home / Self Care | Payer: MEDICAID | Source: Ambulatory Visit | Attending: Internal Medicine | Admitting: Internal Medicine

## 2023-11-09 ENCOUNTER — Emergency Department (HOSPITAL_COMMUNITY)
Admission: EM | Admit: 2023-11-09 | Discharge: 2023-11-09 | Disposition: A | Discharge status: Home / Self Care | Payer: MEDICAID | Attending: Emergency Medicine | Admitting: Emergency Medicine

## 2023-11-09 DIAGNOSIS — I10 Essential (primary) hypertension: Secondary | ICD-10-CM

## 2023-11-09 DIAGNOSIS — R072 Precordial pain: Secondary | ICD-10-CM | POA: Diagnosis not present

## 2023-11-09 DIAGNOSIS — J449 Chronic obstructive pulmonary disease, unspecified: Secondary | ICD-10-CM | POA: Diagnosis not present

## 2023-11-09 DIAGNOSIS — J0100 Acute maxillary sinusitis, unspecified: Secondary | ICD-10-CM | POA: Insufficient documentation

## 2023-11-09 DIAGNOSIS — R062 Wheezing: Secondary | ICD-10-CM | POA: Diagnosis not present

## 2023-11-09 DIAGNOSIS — E039 Hypothyroidism, unspecified: Secondary | ICD-10-CM | POA: Insufficient documentation

## 2023-11-09 DIAGNOSIS — N189 Chronic kidney disease, unspecified: Secondary | ICD-10-CM | POA: Diagnosis not present

## 2023-11-09 DIAGNOSIS — R0981 Nasal congestion: Secondary | ICD-10-CM | POA: Diagnosis present

## 2023-11-09 DIAGNOSIS — I129 Hypertensive chronic kidney disease with stage 1 through stage 4 chronic kidney disease, or unspecified chronic kidney disease: Secondary | ICD-10-CM | POA: Insufficient documentation

## 2023-11-09 DIAGNOSIS — Z87898 Personal history of other specified conditions: Secondary | ICD-10-CM

## 2023-11-09 LAB — BASIC METABOLIC PANEL WITH GFR
Anion gap: 12 (ref 5–15)
BUN: 11 mg/dL (ref 6–20)
CO2: 24 mmol/L (ref 22–32)
Calcium: 9.2 mg/dL (ref 8.9–10.3)
Chloride: 100 mmol/L (ref 98–111)
Creatinine, Ser: 0.92 mg/dL (ref 0.44–1.00)
GFR, Estimated: >60 mL/min (ref >60)
Glucose, Bld: 94 mg/dL (ref 70–99)
Potassium: 3.8 mmol/L (ref 3.5–5.1)
Sodium: 136 mmol/L (ref 135–145)

## 2023-11-09 LAB — CBC
HCT: 42.6 % (ref 36.0–46.0)
Hemoglobin: 13.9 g/dL (ref 12.0–15.0)
MCH: 30.3 pg (ref 26.0–34.0)
MCHC: 32.6 g/dL (ref 30.0–36.0)
MCV: 93 fL (ref 80.0–100.0)
Platelets: 176 K/uL (ref 150–400)
RBC: 4.58 MIL/uL (ref 3.87–5.11)
RDW: 14.7 % (ref 11.5–15.5)
WBC: 10.1 K/uL (ref 4.0–10.5)
nRBC: 0 % (ref 0.0–0.2)

## 2023-11-09 LAB — TROPONIN I (HIGH SENSITIVITY): Troponin I (High Sensitivity): <2 ng/L (ref <18)

## 2023-11-09 MED ORDER — AMOXICILLIN-POT CLAVULANATE 875-125 MG PO TABS: 1.0000 | ORAL_TABLET | Freq: Two times a day (BID) | ORAL | 0 refills | Status: AC

## 2023-11-09 MED ORDER — FLUTICASONE PROPIONATE 50 MCG/ACT NA SUSP: 1.0000 | Freq: Every day | NASAL | 0 refills | Status: AC

## 2023-11-09 MED ORDER — ALBUTEROL SULFATE HFA 108 (90 BASE) MCG/ACT IN AERS: 1.0000 | INHALATION_SPRAY | Freq: Four times a day (QID) | RESPIRATORY_TRACT | 0 refills | Status: AC | PRN

## 2023-11-09 NOTE — Discharge Instructions (Signed)
 Take the entire course of the antibiotic prescribed.  Use your nasal inhaler daily to help reduce the swelling of your nasal and sinus passages.  Use your inhaler as needed if your wheezing returns.  Get rechecked for any persistent or worsening symptoms.

## 2023-11-09 NOTE — ED Triage Notes (Signed)
 Pt c/o of chest pain x 2 weeks and increased heart burn. Also c/o of sinus congestion w/ flem.

## 2023-11-09 NOTE — ED Provider Notes (Signed)
 [place] EMERGENCY DEPARTMENT AT [facility] Provider Note   CSN: [id number] Arrival date & time: 11/09/23  9076     Patient presents with: Chest Pain   Whitney Bush is a 53 y.o. female with a history including hypertension, GERD, PTSD, depression, history of alcohol abuse with alcohol induced cirrhosis, also hypothyroidism, chronic kidney disease and COPD presenting with an approximate 2-week history of multiple complaints, first she describes chest pressure and intermittent wheezing which seems to be triggered by copious postnasal drip she also endorses nasal and sinus congestion, along with left cheek discomfort, also complaining of right ear pain.  She denies fevers or chills.  Her nasal secretions are very thick and sometimes very dark [place] in coloration.  She does endorse intermittent right ear pain, no cough, midsternal chest pain has been intermittent x 2 weeks, associated with wheezing.  She does have an albuterol  inhaler which was helpful but has run out of this medication.  She has had no treatments for her sinus symptoms but states has been on Flonase  in the past which has been helpful.  Of note, she is scheduled for EGD in 5 days under the care of Dr. Shaaron.  She has recently increased her Protonix  to 40 mg twice daily which has been helpful regarding acid reflux symptoms.   The history is provided by the patient.       Prior to Admission medications   Medication Sig Start Date End Date Taking? Authorizing Provider  albuterol  (VENTOLIN  HFA) 108 (90 Base) MCG/ACT inhaler Inhale 1-2 puffs into the lungs every 6 (six) hours as needed for wheezing or shortness of breath. 11/09/23  Yes Shaneal Barasch, PA-C  amoxicillin -clavulanate (AUGMENTIN ) 875-125 MG tablet Take 1 tablet by mouth every 12 (twelve) hours. 11/09/23  Yes Kenta Laster, Mliss, PA-C  fluticasone  (FLONASE ) 50 MCG/ACT nasal spray Place 1 spray into both nostrils daily. 11/09/23  Yes Muhammadali Ries, Mliss, PA-C  clindamycin   (CLEOCIN  T) 1 % SWAB Apply topically. 09/24/23   [provider]  escitalopram (LEXAPRO) 10 MG tablet Take 10 mg by mouth daily. Patient not taking: Reported on 10/08/2023 02/21/22   [provider]  folic acid  (FOLVITE ) 1 MG tablet Take 1 mg by mouth daily. Patient not taking: Reported on 10/08/2023 03/07/22   [provider]  furosemide  (LASIX ) 40 MG tablet Take 1 tablet (40 mg total) by mouth daily. 10/08/23   Ezzard Sonny RAMAN, PA-C  lactulose  (CHRONULAC ) 10 GM/15ML solution TAKE 30MLS BY MOUTH TWICE DAILY. Goal of 2-3 soft stools daily. 10/08/23   Ezzard Sonny RAMAN, PA-C  pantoprazole  (PROTONIX ) 40 MG tablet Take 1 tablet (40 mg total) by mouth daily before breakfast. 10/08/23   Ezzard Sonny RAMAN, PA-C  propranolol  (INDERAL ) 20 MG tablet Take 1 tablet (20 mg total) by mouth 2 (two) times daily. 10/08/23   Ezzard Sonny RAMAN, PA-C  QUEtiapine (SEROQUEL) 50 MG tablet Take 100 mg by mouth at bedtime. 02/01/22   [provider]  Secukinumab (COSENTYX, 300 MG DOSE, [place]) Inject 300 mg into the skin every 30 (thirty) days.    [provider]  spironolactone  (ALDACTONE ) 100 MG tablet Take 1 tablet (100 mg total) by mouth daily. 10/08/23   Ezzard Sonny RAMAN, PA-C    Allergies: Patient has no known allergies.    Review of Systems  Constitutional:  Negative for chills and fever.  HENT:  Positive for ear pain, sinus pressure and sinus pain. Negative for congestion, ear discharge, hearing loss and sore throat.  Eyes: Negative.   Respiratory:  Positive for shortness of breath and wheezing. Negative for chest tightness.   Cardiovascular:  Positive for chest pain.  Gastrointestinal:  Negative for abdominal pain, nausea and vomiting.  Genitourinary: Negative.   Musculoskeletal:  Negative for arthralgias, joint swelling and neck pain.  Skin: Negative.  Negative for rash and wound.  Neurological:  Negative for dizziness, weakness, light-headedness, numbness and headaches.   Psychiatric/Behavioral: Negative.      Updated Vital Signs BP 130/85 (BP Location: Right Wrist)   Pulse 77   Temp 97.7 F (36.5 C) (Temporal)   Resp 18   Ht 5' 3 (1.6 m)   Wt 124.7 kg   LMP 09/12/2017 (Exact Date) Comment:    SpO2 95%   BMI 48.71 kg/m   Physical Exam Vitals and nursing note reviewed.  Constitutional:      Appearance: She is well-developed.  HENT:     Head: Normocephalic and atraumatic.     Right Ear: A middle ear effusion is present.     Nose:     Left Turbinates: Swollen.     Left Sinus: Maxillary sinus tenderness present.  Eyes:     Extraocular Movements: Extraocular movements intact.     Conjunctiva/sclera: Conjunctivae normal.  Cardiovascular:     Rate and Rhythm: Normal rate and regular rhythm.     Heart sounds: Normal heart sounds.  Pulmonary:     Effort: Pulmonary effort is normal. No accessory muscle usage.     Breath sounds: Normal breath sounds. No wheezing, rhonchi or rales.  Abdominal:     General: Bowel sounds are normal.     Palpations: Abdomen is soft.     Tenderness: There is no abdominal tenderness.  Musculoskeletal:        General: Normal range of motion.     Cervical back: Normal range of motion.  Skin:    General: Skin is warm and dry.  Neurological:     Mental Status: She is alert.     (all labs ordered are listed, but only abnormal results are displayed) Labs Reviewed  BASIC METABOLIC PANEL WITH GFR  CBC  TROPONIN I (HIGH SENSITIVITY)    EKG: EKG Interpretation Date/Time:  Friday November 09 2023 09:47:27 EDT Ventricular Rate:  75 PR Interval:  128 QRS Duration:  76 QT Interval:  358 QTC Calculation: 399 R Axis:   69  Text Interpretation: Normal sinus rhythm Low voltage QRS Borderline ECG When compared with ECG of 24-Oct-2021 14:29, No significant change was found Confirmed by Towana Sharper [phone]) on 11/09/2023 9:49:37 AM  Radiology: [facility] Chest 2 View Result Date: 11/09/2023 CLINICAL DATA:  Chest pain for  2 weeks. EXAM: CHEST - 2 VIEW COMPARISON:  October 23, 2017. FINDINGS: The heart size and mediastinal contours are within normal limits. Both lungs are clear. The visualized skeletal structures are unremarkable. IMPRESSION: No active cardiopulmonary disease. Electronically Signed   By: Lynwood Landy Raddle M.D.   On: 11/09/2023 10:22     Procedures   Medications Ordered in the ED - No data to display                                  Medical Decision Making Patient presenting with initial complaint of chest pain which has been intermittent for 2 weeks, also with associated heart burn however she endorses improvement after her GI specialist advised her to increase her Protonix  twice daily.  Her main complaint today is for sinus pain, purulent nasal discharge and postnasal drip which she feels is triggering wheezing.  She is not wheezing on exam today, however her exam does suggest acute sinusitis, there is also suggestion of a right otitis media as well.  Labs and imaging are reassuring.  No indication this is ACS, no pneumonia based on plain chest x-ray, exam is otherwise reassuring.  She is given a refill of her albuterol  MDI for as needed use, of note she is not wheezing on her exam today.  She was also given prescriptions for Flonase  and Augmentin .  She is scheduled to see GI in 5 days for a planned EGD.  Amount and/or Complexity of Data Reviewed Labs: ordered.    Details: Labs reviewed, CBC, B met and troponin are all negative. Radiology: ordered.    Details: Chest x-ray is negative for pneumonia or other acute cardiopulmonary process. ECG/medicine tests: ordered.    Details: EKG reviewed normal sinus rhythm rate 75.  Risk Prescription drug management.        Final diagnoses:  Acute non-recurrent maxillary sinusitis  History of wheezing    ED Discharge Orders          Ordered    albuterol  (VENTOLIN  HFA) 108 (90 Base) MCG/ACT inhaler  Every 6 hours PRN        11/09/23 1326     fluticasone  (FLONASE ) 50 MCG/ACT nasal spray  Daily        11/09/23 1326    amoxicillin -clavulanate (AUGMENTIN ) 875-125 MG tablet  Every 12 hours        11/09/23 1326               Davinia Riccardi, PA-C 11/09/23 1329    Towana Ozell BROCKS, MD 11/09/23 1727

## 2023-11-09 NOTE — Pre-Procedure Instructions (Signed)
 Pt called for PAT. Pt stated that she was going to the ED because she was having trouble breathing. She was congested and had felt bad for about 2 weeks now. Asked the pt to call back to let me know how she was feeling and that if she was still having these symptoms, it would not be a good time to have this procedure.

## 2023-11-14 ENCOUNTER — Ambulatory Visit (HOSPITAL_COMMUNITY): Admission: RE | Admit: 2023-11-14 | Payer: MEDICAID | Source: Home / Self Care | Admitting: Internal Medicine

## 2023-11-14 ENCOUNTER — Encounter (HOSPITAL_COMMUNITY): Admission: RE | Payer: Self-pay | Source: Home / Self Care

## 2023-11-14 SURGERY — EGD (ESOPHAGOGASTRODUODENOSCOPY)
Anesthesia: Choice

## 2024-01-08 ENCOUNTER — Ambulatory Visit: Payer: MEDICAID | Admitting: Gastroenterology

## 2024-01-09 ENCOUNTER — Encounter: Payer: Self-pay | Admitting: Gastroenterology

## 2024-02-28 ENCOUNTER — Encounter: Payer: Self-pay | Admitting: Gastroenterology
